# Patient Record
Sex: Female | Born: 1990
Health system: Southern US, Community
[De-identification: ages and names within clinical notes are randomized; demographics above are authoritative.]

## PROBLEM LIST (undated history)

## (undated) DIAGNOSIS — Z803 Family history of malignant neoplasm of breast: Secondary | ICD-10-CM

## (undated) DIAGNOSIS — I509 Heart failure, unspecified: Secondary | ICD-10-CM

## (undated) DIAGNOSIS — D649 Anemia, unspecified: Secondary | ICD-10-CM

## (undated) DIAGNOSIS — B009 Herpesviral infection, unspecified: Secondary | ICD-10-CM

## (undated) DIAGNOSIS — Z800 Family history of malignant neoplasm of digestive organs: Secondary | ICD-10-CM

## (undated) DIAGNOSIS — IMO0002 Reserved for concepts with insufficient information to code with codable children: Secondary | ICD-10-CM

## (undated) DIAGNOSIS — C801 Malignant (primary) neoplasm, unspecified: Secondary | ICD-10-CM

## (undated) HISTORY — PX: NO PAST SURGERIES: SHX2092

## (undated) HISTORY — DX: Family history of malignant neoplasm of digestive organs: Z80.0

## (undated) HISTORY — DX: Reserved for concepts with insufficient information to code with codable children: IMO0002

## (undated) HISTORY — PX: COLPOSCOPY: SHX161

## (undated) HISTORY — DX: Family history of malignant neoplasm of breast: Z80.3

## (undated) HISTORY — DX: Herpesviral infection, unspecified: B00.9

---

## 2010-06-19 ENCOUNTER — Emergency Department (HOSPITAL_COMMUNITY)
Admission: EM | Admit: 2010-06-19 | Discharge: 2010-06-19 | Disposition: A | Discharge status: Home / Self Care | Payer: 59 | Attending: Emergency Medicine | Admitting: Emergency Medicine

## 2010-06-19 DIAGNOSIS — K2970 Gastritis, unspecified, without bleeding: Secondary | ICD-10-CM | POA: Insufficient documentation

## 2010-06-19 DIAGNOSIS — M79609 Pain in unspecified limb: Secondary | ICD-10-CM | POA: Insufficient documentation

## 2010-06-19 DIAGNOSIS — M722 Plantar fascial fibromatosis: Secondary | ICD-10-CM | POA: Insufficient documentation

## 2010-06-19 DIAGNOSIS — X58XXXA Exposure to other specified factors, initial encounter: Secondary | ICD-10-CM | POA: Insufficient documentation

## 2010-06-19 DIAGNOSIS — M545 Low back pain, unspecified: Secondary | ICD-10-CM | POA: Insufficient documentation

## 2010-06-19 DIAGNOSIS — R1013 Epigastric pain: Secondary | ICD-10-CM | POA: Insufficient documentation

## 2010-06-19 DIAGNOSIS — S335XXA Sprain of ligaments of lumbar spine, initial encounter: Secondary | ICD-10-CM | POA: Insufficient documentation

## 2010-06-19 DIAGNOSIS — M546 Pain in thoracic spine: Secondary | ICD-10-CM | POA: Insufficient documentation

## 2010-06-19 LAB — URINALYSIS, ROUTINE W REFLEX MICROSCOPIC
Glucose, UA: NEGATIVE mg/dL
Nitrite: NEGATIVE
pH: 5.5 (ref 5.0–8.0)

## 2010-06-19 LAB — POCT PREGNANCY, URINE: Preg Test, Ur: NEGATIVE

## 2010-06-19 LAB — URINE MICROSCOPIC-ADD ON

## 2010-12-04 ENCOUNTER — Ambulatory Visit (INDEPENDENT_AMBULATORY_CARE_PROVIDER_SITE_OTHER): Payer: 59 | Admitting: Family Medicine

## 2010-12-04 ENCOUNTER — Encounter: Payer: Self-pay | Admitting: *Deleted

## 2010-12-04 ENCOUNTER — Other Ambulatory Visit (HOSPITAL_COMMUNITY)
Admission: RE | Admit: 2010-12-04 | Discharge: 2010-12-04 | Disposition: A | Discharge status: Home / Self Care | Payer: 59 | Source: Ambulatory Visit | Attending: Family Medicine | Admitting: Family Medicine

## 2010-12-04 VITALS — BP 122/80 | HR 75 | Temp 98.7°F | Ht 63.0 in | Wt 195.8 lb

## 2010-12-04 DIAGNOSIS — IMO0001 Reserved for inherently not codable concepts without codable children: Secondary | ICD-10-CM

## 2010-12-04 DIAGNOSIS — N72 Inflammatory disease of cervix uteri: Secondary | ICD-10-CM | POA: Insufficient documentation

## 2010-12-04 DIAGNOSIS — N870 Mild cervical dysplasia: Secondary | ICD-10-CM | POA: Insufficient documentation

## 2010-12-04 DIAGNOSIS — R87610 Atypical squamous cells of undetermined significance on cytologic smear of cervix (ASC-US): Secondary | ICD-10-CM

## 2010-12-04 MED ORDER — OXYCODONE-ACETAMINOPHEN 5-325 MG PO TABS: 1.0000 | ORAL_TABLET | ORAL | Status: AC | PRN

## 2010-12-04 MED ORDER — IBUPROFEN 200 MG PO TABS: 800.0000 mg | ORAL_TABLET | Freq: Once | ORAL | Status: DC

## 2010-12-04 MED ORDER — IBUPROFEN 800 MG PO TABS: 800.0000 mg | ORAL_TABLET | Freq: Three times a day (TID) | ORAL | Status: AC

## 2010-12-04 NOTE — Progress Notes (Signed)
Colposcopy Procedure Note  Indications: Pap smear 3 months ago showed: ASCUS with POSITIVE high risk HPV. This was the patient's first pap.  Procedure Details  The risks and benefits of the procedure and verbal and  Written informed consent obtained.  Speculum placed in vagina and excellent visualization of cervix achieved, cervix swabbed x 3 with acetic acid solution, followed by iodine solution x 3.  Findings: Cervix: visible lesion(s) at 7 o'clock, acetowhite lesion(s) noted at 7 o'clock and mosaicism noted at 7 and 3 o'clock; cervix swabbed with Lugol's solution, SCJ visualized 360 degrees without lesions, endocervical curettage performed, cervical biopsies taken at 7 and 3 o'clock, specimens labelled and sent to pathology and hemostasis achieved with Monsel's solution.  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 Patient provided 800mg  PO Motrin in the clinic and scripts for percocet and motrin to fill.  Plan: Specimens labelled and sent to Pathology. Will base further treatment on Pathology findings. Post biopsy instructions given to patient. Return to discuss Pathology results in 2 weeks or next available.

## 2010-12-04 NOTE — Progress Notes (Signed)
Addended by: Delena Bali on: 12/04/2010 03:38 PM   Modules accepted: Orders

## 2011-01-01 ENCOUNTER — Encounter: Payer: Self-pay | Admitting: Family Medicine

## 2011-01-04 ENCOUNTER — Ambulatory Visit: Payer: 59 | Admitting: Family Medicine

## 2011-01-22 ENCOUNTER — Ambulatory Visit: Payer: 59 | Admitting: Family Medicine

## 2011-07-13 DIAGNOSIS — R87619 Unspecified abnormal cytological findings in specimens from cervix uteri: Secondary | ICD-10-CM

## 2011-07-13 DIAGNOSIS — IMO0002 Reserved for concepts with insufficient information to code with codable children: Secondary | ICD-10-CM

## 2011-07-13 HISTORY — DX: Unspecified abnormal cytological findings in specimens from cervix uteri: R87.619

## 2011-07-13 HISTORY — DX: Reserved for concepts with insufficient information to code with codable children: IMO0002

## 2012-07-08 ENCOUNTER — Encounter: Payer: Self-pay | Admitting: Nurse Practitioner

## 2012-07-08 ENCOUNTER — Telehealth: Payer: Self-pay | Admitting: Nurse Practitioner

## 2012-07-08 NOTE — Telephone Encounter (Signed)
Spoke with pt who took Provera about a month ago to try to regulate her cycle. Pt had normal period after completing provera. Pt had been interested in starting Depo, but now she may be interested in getting pregnant and stated she and her boyfriend "have started trying." Pt is concerned she may have trouble conceiving if she is not having regular cycles and possibly not ovulating. Offered OV for pre-conception counseling, but pt declined. Any advice?

## 2012-07-08 NOTE — Telephone Encounter (Signed)
Patient calling to speak with nurse about irregular cycles.

## 2012-07-08 NOTE — Telephone Encounter (Signed)
This 22 yo female has history of oligomenorrhea since menarche.  It has been suspected that she has PCOS.  She had labs done 08/16/2010 that was consistent with PCOS. She was started on OCP which she only took for a short time.  Since then amenorrhea and has had several courses of Provera/ Prometrium to initiate a menses.  She does not ovulate regular and most likely it will be harder to get pregnant.  She may need to take med's to induce ovulation and be followed closely.  She will need appointment with MD to discuss and may be a candidate for Clomid. There has been lab appointments canceled secondary to not having co-payment.  If she makes appointment with MD she must be informed that she must keep appointment.

## 2012-07-08 NOTE — Telephone Encounter (Signed)
Spoke with pt about PG advice. Pt ready to schedule appt to discuss Clomid or other routes for conception. Sched OV with TL tomorrow at 1030.

## 2012-07-09 ENCOUNTER — Ambulatory Visit (INDEPENDENT_AMBULATORY_CARE_PROVIDER_SITE_OTHER): Payer: 59 | Admitting: Gynecology

## 2012-07-09 VITALS — BP 110/72

## 2012-07-09 DIAGNOSIS — N970 Female infertility associated with anovulation: Secondary | ICD-10-CM | POA: Insufficient documentation

## 2012-07-09 DIAGNOSIS — N926 Irregular menstruation, unspecified: Secondary | ICD-10-CM

## 2012-07-09 MED ORDER — NORETHIN ACE-ETH ESTRAD-FE 1-20 MG-MCG(24) PO TABS: 1.0000 | ORAL_TABLET | Freq: Every day | ORAL | Status: DC

## 2012-07-09 MED ORDER — MEDROXYPROGESTERONE ACETATE 10 MG PO TABS: 10.0000 mg | ORAL_TABLET | Freq: Every day | ORAL | Status: DC

## 2012-07-09 NOTE — Patient Instructions (Signed)
Polycystic Ovarian Syndrome Polycystic ovarian syndrome is a condition with a number of problems. One problem is with the ovaries. The ovaries are organs located in the female pelvis, on each side of the uterus. Usually, during the menstrual cycle, an egg is released from 1 ovary every month. This is called ovulation. When the egg is fertilized, it goes into the womb (uterus), which allows for the growth of a baby. The egg travels from the ovary through the fallopian tube to the uterus. The ovaries also make the hormones estrogen and progesterone. These hormones help the development of a woman's breasts, body shape, and body hair. They also regulate the menstrual cycle and pregnancy. Sometimes, cysts form in the ovaries. A cyst is a fluid-filled sac. On the ovary, different types of cysts can form. The most common type of ovarian cyst is called a functional or ovulation cyst. It is normal, and often forms during the normal menstrual cycle. Each month, a woman's ovaries grow tiny cysts that hold the eggs. When an egg is fully grown, the sac breaks open. This releases the egg. Then, the sac which released the egg from the ovary dissolves. In one type of functional cyst, called a follicle cyst, the sac does not break open to release the egg. It may actually continue to grow. This type of cyst usually disappears within 1 to 3 months.  One type of cyst problem with the ovaries is called Polycystic Ovarian Syndrome (PCOS). In this condition, many follicle cysts form, but do not rupture and produce an egg. This health problem can affect the following:  Menstrual cycle.  Heart.  Obesity.  Cancer of the uterus.  Fertility.  Blood vessels.  Hair growth (face and body) or baldness.  Hormones.  Appearance.  High blood pressure.  Stroke.  Insulin production.  Inflammation of the liver.  Elevated blood cholesterol and triglycerides. CAUSES   No one knows the exact cause of PCOS.  Women with  PCOS often have a mother or sister with PCOS. There is not yet enough proof to say this is inherited.  Many women with PCOS have a weight problem.  Researchers are looking at the relationship between PCOS and the body's ability to make insulin. Insulin is a hormone that regulates the change of sugar, starches, and other food into energy for the body's use, or for storage. Some women with PCOS make too much insulin. It is possible that the ovaries react by making too many female hormones, called androgens. This can lead to acne, excessive hair growth, weight gain, and ovulation problems.  Too much production of luteinizing hormone (LH) from the pituitary gland in the brain stimulates the ovary to produce too much female hormone (androgen). SYMPTOMS   Infrequent or no menstrual periods, and/or irregular bleeding.  Inability to get pregnant (infertility), because of not ovulating.  Increased growth of hair on the face, chest, stomach, back, thumbs, thighs, or toes.  Acne, oily skin, or dandruff.  Pelvic pain.  Weight gain or obesity, usually carrying extra weight around the waist.  Type 2 diabetes (this is the diabetes that usually does not need insulin).  High cholesterol.  High blood pressure.  Female-pattern baldness or thinning hair.  Patches of thickened and dark brown or black skin on the neck, arms, breasts, or thighs.  Skin tags, or tiny excess flaps of skin, in the armpits or neck area.  Sleep apnea (excessive snoring and breathing stops at times while asleep).  Deepening of the voice.    Gestational diabetes when pregnant.  Increased risk of miscarriage with pregnancy. DIAGNOSIS  There is no single test to diagnose PCOS.   Your caregiver will:  Take a medical history.  Perform a pelvic exam.  Perform an ultrasound.  Check your female and female hormone levels.  Measure glucose or sugar levels in the blood.  Do other blood tests.  If you are producing too many  female hormones, your caregiver will make sure it is from PCOS. At the physical exam, your caregiver will want to evaluate the areas of increased hair growth. Try to allow natural hair growth for a few days before the visit.  During a pelvic exam, the ovaries may be enlarged or swollen by the increased number of small cysts. This can be seen more easily by vaginal ultrasound or screening, to examine the ovaries and lining of the uterus (endometrium) for cysts. The uterine lining may become thicker, if there has not been a regular period. TREATMENT  Because there is no cure for PCOS, it needs to be managed to prevent problems. Treatments are based on your symptoms. Treatment is also based on whether you want to have a baby or whether you need contraception.  Treatment may include:  Progesterone hormone, to start a menstrual period.  Birth control pills, to make you have regular menstrual periods.  Medicines to make you ovulate, if you want to get pregnant.  Medicines to control your insulin.  Medicine to control your blood pressure.  Medicine and diet, to control your high cholesterol and triglycerides in your blood.  Surgery, making small holes in the ovary, to decrease the amount of female hormone production. This is done through a long, lighted tube (laparoscope), placed into the pelvis through a tiny incision in the lower abdomen. Your caregiver will go over some of the choices with you. WOMEN WITH PCOS HAVE THESE CHARACTERISTICS:  High levels of female hormones called androgens.  An irregular or no menstrual cycle.  May have many small cysts in their ovaries. PCOS is the most common hormonal reproductive problem in women of childbearing age. WHY DO WOMEN WITH PCOS HAVE TROUBLE WITH THEIR MENSTRUAL CYCLE? Each month, about 20 eggs start to mature in the ovaries. As one egg grows and matures, the follicle breaks open to release the egg, so it can travel through the fallopian tube for  fertilization. When the single egg leaves the follicle, ovulation takes place. In women with PCOS, the ovary does not make all of the hormones it needs for any of the eggs to fully mature. They may start to grow and accumulate fluid, but no one egg becomes large enough. Instead, some may remain as cysts. Since no egg matures or is released, ovulation does not occur and the hormone progesterone is not made. Without progesterone, a woman's menstrual cycle is irregular or absent. Also, the cysts produce female hormones, which continue to prevent ovulation.  Document Released: 06/22/2004 Document Revised: 05/21/2011 Document Reviewed: 01/14/2009 Exit[employer] Patient Information 2013 [employer].  

## 2012-07-09 NOTE — Progress Notes (Signed)
Pt presents c/o lifelong history of oligomenorrhea.  Pt states menarche was 12, initially monthly but after a few years q56m, now no cycle without ocp.  Pt reports withdraw bleed with provera every time, given only 2x.  Last 2/14, flow 7d,  + clots-raisin size.  Mild cramping.  Pt has never had post-coital bleeding.  Pt currently using condoms.  Pt never been pregnant.  Pt has never been formally evaluated, she is not pressed to conceive today but was concerned because of family history in her mother and meternal aunts.  Pt does report obesity as well in those relations.  Had a long discussion regarding anovulation and its causes.  Discussed affects of obesity and extra estrogen, risks of uterine cancer with chronic exposure to unopposed estrogen. Drew pictures of polycystic ovaries and risks of elevated LH, and metabolic syndrome. Ovulation induction methods also briefly discussed, but pt not interested in treatment at this time.  Benefits of ocp reviewed.  Pt will be evaluated for PCOS/metabolic syndrome We will draw a qual HCG and bring on her menses if negative with provera, she is instructed to start OC with inset of flow. She will rto fasting  LMP approx 3/7 brought on with provera  Length of visit 40m, >50% face to face discussing anovulation

## 2012-07-10 ENCOUNTER — Other Ambulatory Visit (INDEPENDENT_AMBULATORY_CARE_PROVIDER_SITE_OTHER): Payer: 59

## 2012-07-10 DIAGNOSIS — N926 Irregular menstruation, unspecified: Secondary | ICD-10-CM

## 2012-07-11 ENCOUNTER — Ambulatory Visit: Payer: Self-pay | Admitting: Gynecology

## 2012-07-11 ENCOUNTER — Institutional Professional Consult (permissible substitution): Payer: Self-pay | Admitting: Gynecology

## 2012-07-11 LAB — HCG, SERUM, QUALITATIVE: Preg, Serum: NEGATIVE

## 2012-07-11 LAB — TSH: TSH: 2.032 u[IU]/mL (ref 0.350–4.500)

## 2012-07-11 LAB — GLUCOSE, FASTING: Glucose, Fasting: 82 mg/dL (ref 70–99)

## 2012-07-11 LAB — INSULIN, FASTING: Insulin fasting, serum: 10 u[IU]/mL (ref 3–28)

## 2012-07-14 ENCOUNTER — Telehealth: Payer: Self-pay | Admitting: Gynecology

## 2012-07-14 MED ORDER — MEDROXYPROGESTERONE ACETATE 10 MG PO TABS: 10.0000 mg | ORAL_TABLET | Freq: Every day | ORAL | Status: DC

## 2012-07-14 MED ORDER — NORETHIN ACE-ETH ESTRAD-FE 1-20 MG-MCG(24) PO TABS: 1.0000 | ORAL_TABLET | Freq: Every day | ORAL | Status: DC

## 2012-07-14 NOTE — Telephone Encounter (Signed)
Please Advise -Routed to Dr. Lathrop 

## 2012-07-14 NOTE — Telephone Encounter (Signed)
Pt notified of labs

## 2012-07-14 NOTE — Addendum Note (Signed)
Addended by: Lorraine Lax on: 07/14/2012 01:44 PM   Modules accepted: Orders

## 2012-07-14 NOTE — Telephone Encounter (Signed)
Pharmacy is calling because insurance company will not pay for the minastrin fe 24.

## 2012-07-15 NOTE — Telephone Encounter (Signed)
Switch to lomedia

## 2012-07-15 NOTE — Telephone Encounter (Signed)
Per Dr. Farrel Gobble Pt is instructed to start her provera to bring on her menses, then the first day of flow she is to take the lomedia; lomedia 24 fe sent through to [facility] on [address] by Dr. Farrel Gobble. Pt notified and aware

## 2012-07-17 ENCOUNTER — Telehealth: Payer: Self-pay | Admitting: Nurse Practitioner

## 2012-07-17 NOTE — Telephone Encounter (Signed)
[facility] [address] [phone]-patient needs another birth control option due to current rx costing $100.00.

## 2012-07-17 NOTE — Telephone Encounter (Signed)
Routed to Dr. Farrel Gobble please advise.

## 2012-07-18 ENCOUNTER — Other Ambulatory Visit: Payer: Self-pay | Admitting: Gynecology

## 2012-07-18 MED ORDER — DESOGESTREL-ETHINYL ESTRADIOL 0.15-0.02/0.01 MG (21/5) PO TABS: 1.0000 | ORAL_TABLET | Freq: Every day | ORAL | Status: DC

## 2012-07-18 NOTE — Telephone Encounter (Signed)
Called in [employer], hope it's covered

## 2012-07-18 NOTE — Telephone Encounter (Signed)
Pt notified of Dr. Farrel Gobble calling in her [facility] mircette; pt says it sounds familiar.

## 2012-08-11 ENCOUNTER — Telehealth: Payer: Self-pay | Admitting: Nurse Practitioner

## 2012-08-11 NOTE — Telephone Encounter (Signed)
Spoke with pt who was dx with PCOS and was started on OCP for treatment. Pt has printout discussing treatment of PCOS and it mentions other options for treatment. She is wondering if she should be doing anything else to treat her PCOS. Pt also reports she was dx with herpes on a prior visit and medication was mentioned, but pt did not have health insurance at the time. Pt didn't know if she needed anything for the herpes. Pt denies any other outbreaks since then.  No immediate need for Valtrex? Please advise on anything further for PCOS.

## 2012-08-11 NOTE — Telephone Encounter (Signed)
Patient has questions for nurse about the birth control pills she is taking.

## 2012-08-11 NOTE — Telephone Encounter (Signed)
Dr. Farrel Gobble you saw this patient for AEX - does she need further treatment or evaluation? Sending this to you for advise and then back to nurse.

## 2012-08-12 NOTE — Telephone Encounter (Signed)
Routed to Amy

## 2012-08-12 NOTE — Telephone Encounter (Signed)
Spoke with pt about sched OV to discuss PCOS tx options. Pt wants to wait until next payday 08-22-12. Sched OV 08-22-12 with TL.

## 2012-08-12 NOTE — Telephone Encounter (Signed)
Ov to discuss options would be best

## 2012-08-22 ENCOUNTER — Ambulatory Visit (INDEPENDENT_AMBULATORY_CARE_PROVIDER_SITE_OTHER): Payer: 59 | Admitting: Gynecology

## 2012-08-22 VITALS — BP 104/66 | HR 78 | Resp 18 | Ht 63.0 in | Wt 143.0 lb

## 2012-08-22 DIAGNOSIS — E282 Polycystic ovarian syndrome: Secondary | ICD-10-CM | POA: Insufficient documentation

## 2012-08-22 DIAGNOSIS — N915 Oligomenorrhea, unspecified: Secondary | ICD-10-CM

## 2012-08-22 MED ORDER — DESOGESTREL-ETHINYL ESTRADIOL 0.15-0.02/0.01 MG (21/5) PO TABS: 1.0000 | ORAL_TABLET | Freq: Every day | ORAL | Status: DC

## 2012-08-22 NOTE — Patient Instructions (Signed)
Polycystic Ovarian Syndrome  Polycystic ovarian syndrome is a condition with a number of problems. One problem is with the ovaries. The ovaries are organs located in the female pelvis, on each side of the uterus. Usually, during the menstrual cycle, an egg is released from 1 ovary every month. This is called ovulation. When the egg is fertilized, it goes into the womb (uterus), which allows for the growth of a baby. The egg travels from the ovary through the fallopian tube to the uterus. The ovaries also make the hormones estrogen and progesterone. These hormones help the development of a woman's breasts, body shape, and body hair. They also regulate the menstrual cycle and pregnancy.  Sometimes, cysts form in the ovaries. A cyst is a fluid-filled sac. On the ovary, different types of cysts can form. The most common type of ovarian cyst is called a functional or ovulation cyst. It is normal, and often forms during the normal menstrual cycle. Each month, a woman's ovaries grow tiny cysts that hold the eggs. When an egg is fully grown, the sac breaks open. This releases the egg. Then, the sac which released the egg from the ovary dissolves. In one type of functional cyst, called a follicle cyst, the sac does not break open to release the egg. It may actually continue to grow. This type of cyst usually disappears within 1 to 3 months.   One type of cyst problem with the ovaries is called Polycystic Ovarian Syndrome (PCOS). In this condition, many follicle cysts form, but do not rupture and produce an egg. This health problem can affect the following:  · Menstrual cycle.  · Heart.  · Obesity.  · Cancer of the uterus.  · Fertility.  · Blood vessels.  · Hair growth (face and body) or baldness.  · Hormones.  · Appearance.  · High blood pressure.  · Stroke.  · Insulin production.  · Inflammation of the liver.  · Elevated blood cholesterol and triglycerides.  CAUSES   · No one knows the exact cause of PCOS.  · Women with  PCOS often have a mother or sister with PCOS. There is not yet enough proof to say this is inherited.  · Many women with PCOS have a weight problem.  · Researchers are looking at the relationship between PCOS and the body's ability to make insulin. Insulin is a hormone that regulates the change of sugar, starches, and other food into energy for the body's use, or for storage. Some women with PCOS make too much insulin. It is possible that the ovaries react by making too many female hormones, called androgens. This can lead to acne, excessive hair growth, weight gain, and ovulation problems.  · Too much production of luteinizing hormone (LH) from the pituitary gland in the brain stimulates the ovary to produce too much female hormone (androgen).  SYMPTOMS   · Infrequent or no menstrual periods, and/or irregular bleeding.  · Inability to get pregnant (infertility), because of not ovulating.  · Increased growth of hair on the face, chest, stomach, back, thumbs, thighs, or toes.  · Acne, oily skin, or dandruff.  · Pelvic pain.  · Weight gain or obesity, usually carrying extra weight around the waist.  · Type 2 diabetes (this is the diabetes that usually does not need insulin).  · High cholesterol.  · High blood pressure.  · Female-pattern baldness or thinning hair.  · Patches of thickened and dark brown or black skin on the neck, arms, breasts,   or thighs.  · Skin tags, or tiny excess flaps of skin, in the armpits or neck area.  · Sleep apnea (excessive snoring and breathing stops at times while asleep).  · Deepening of the voice.  · Gestational diabetes when pregnant.  · Increased risk of miscarriage with pregnancy.  DIAGNOSIS   There is no single test to diagnose PCOS.   · Your caregiver will:  · Take a medical history.  · Perform a pelvic exam.  · Perform an ultrasound.  · Check your female and female hormone levels.  · Measure glucose or sugar levels in the blood.  · Do other blood tests.  · If you are producing too many  female hormones, your caregiver will make sure it is from PCOS. At the physical exam, your caregiver will want to evaluate the areas of increased hair growth. Try to allow natural hair growth for a few days before the visit.  · During a pelvic exam, the ovaries may be enlarged or swollen by the increased number of small cysts. This can be seen more easily by vaginal ultrasound or screening, to examine the ovaries and lining of the uterus (endometrium) for cysts. The uterine lining may become thicker, if there has not been a regular period.  TREATMENT   Because there is no cure for PCOS, it needs to be managed to prevent problems. Treatments are based on your symptoms. Treatment is also based on whether you want to have a baby or whether you need contraception.   Treatment may include:  · Progesterone hormone, to start a menstrual period.  · Birth control pills, to make you have regular menstrual periods.  · Medicines to make you ovulate, if you want to get pregnant.  · Medicines to control your insulin.  · Medicine to control your blood pressure.  · Medicine and diet, to control your high cholesterol and triglycerides in your blood.  · Surgery, making small holes in the ovary, to decrease the amount of female hormone production. This is done through a long, lighted tube (laparoscope), placed into the pelvis through a tiny incision in the lower abdomen.  Your caregiver will go over some of the choices with you.  WOMEN WITH PCOS HAVE THESE CHARACTERISTICS:  · High levels of female hormones called androgens.  · An irregular or no menstrual cycle.  · May have many small cysts in their ovaries.  PCOS is the most common hormonal reproductive problem in women of childbearing age.  WHY DO WOMEN WITH PCOS HAVE TROUBLE WITH THEIR MENSTRUAL CYCLE?  Each month, about 20 eggs start to mature in the ovaries. As one egg grows and matures, the follicle breaks open to release the egg, so it can travel through the fallopian tube for  fertilization. When the single egg leaves the follicle, ovulation takes place. In women with PCOS, the ovary does not make all of the hormones it needs for any of the eggs to fully mature. They may start to grow and accumulate fluid, but no one egg becomes large enough. Instead, some may remain as cysts. Since no egg matures or is released, ovulation does not occur and the hormone progesterone is not made. Without progesterone, a woman's menstrual cycle is irregular or absent. Also, the cysts produce female hormones, which continue to prevent ovulation.   Document Released: 06/22/2004 Document Revised: 05/21/2011 Document Reviewed: 01/14/2009  ExitCare® Patient Information ©2014 [employer].

## 2012-08-25 NOTE — Progress Notes (Signed)
Pt here to discuss her PCOS and questions ability to get pregnant.  She is currently not interested in being pregnant but just want to know if it is possible.  We reviewed her labs in her paper chart and discussed PCOS and it's affects on ovulation in addition we discussed metabolic syndrome and the affects on it on general health.  Pt did have normal fasting insulin, no fasting glucose had been done, an FSH/LH ratio was also not done but as pt is on ocp, canot.  We discussed the benefits of ocp as she does not get a regular menses and risk of uterine cancer.  I assured her that there are medications currently available to help her concieve but we should use them when she is actively trying to do so. At present, she should stay on ocp. Refill given Agreeable. Questions addressed

## 2012-09-18 ENCOUNTER — Telehealth: Payer: Self-pay | Admitting: Nurse Practitioner

## 2012-09-18 NOTE — Telephone Encounter (Signed)
LMTCB  aa 

## 2012-09-18 NOTE — Telephone Encounter (Signed)
Patient has some questions for nurse about lab work she may want to have.

## 2012-09-19 NOTE — Telephone Encounter (Signed)
Last office visit 08/22/2012 with Dr. Farrel Gobble PCOS Patient calling today to schedule appointment with Dr. Farrel Gobble concerning PCOS . Did not fully understand at visit that would need lab work to further diagnose PCOS and if she wanted to conceive should let Dr. Farrel Gobble know so Lab work and medications could be done for this.,  Appointment given for July 21st @ 5/30pm with Dr. Farrel Gobble.

## 2012-09-29 ENCOUNTER — Telehealth: Payer: Self-pay | Admitting: Gynecology

## 2012-09-29 ENCOUNTER — Ambulatory Visit: Payer: 59 | Admitting: Gynecology

## 2012-09-29 NOTE — Telephone Encounter (Signed)
Per our last note, I thought she was just supposed to get some fasting labs if she wanted but I did not see a recall ov requested. Ok to cancel, is she taking the ocp?

## 2012-09-29 NOTE — Telephone Encounter (Signed)
Patient canceled her appointment today for consult and lab work. Patient says she doesn't want to pay the $50.00 copay.

## 2012-09-30 NOTE — Telephone Encounter (Signed)
Left Message To Call Back  

## 2012-09-30 NOTE — Telephone Encounter (Signed)
Per Dr. Farrel Gobble okay for patient to have bloodwork, patient not sure what she needs to have done looked in patient's chart she has had FSH/LH, TSH, Prolactin, Fasting Insulin, Fasting Glucose all were normal. Told pt she wouldn't need to have anything done since Dr. Farrel Gobble check her bloodwork and everything was consistent with PCOS. Pt agreeable.  Patient asked about getting medication for a HSV outbreak told patient if we have checked her HSV in the past, she said yes. She said Valtrex was prescribed to her in the past  but she couldn't afford it due to her not having insurance. I asked her if she was having a outbreak now, pt said she was not. Just wanted to know what she needed to do in the future if she does.  Told her all she would need to do was just give [place] a call. Pt is agreeable.

## 2012-09-30 NOTE — Telephone Encounter (Signed)
Patient says she was told that she needed another consult and bloodwork. Patient was confused and is not sure what she should have done. Her co pay is $50.00  says she was willing to get bloodwork done. I told her that I would check with Dr. Farrel Gobble since there was some confusion. Please advise.

## 2012-09-30 NOTE — Telephone Encounter (Signed)
Patient was concern with question you had ask, need more information.

## 2012-09-30 NOTE — Telephone Encounter (Signed)
Patient called again wanting to make sure she didn't need anything for her HSV, asked patient again if she was having a outbreak patient said she is not, just wanted to make sure all her questions were asked. Informed patient that she doesn't need any medication unless she is having a outbreak and if she does then she can give [place] a call and we can prescribe something for her. Pt is aware and agreeable.

## 2012-11-27 ENCOUNTER — Telehealth: Payer: Self-pay | Admitting: Nurse Practitioner

## 2012-11-27 NOTE — Telephone Encounter (Signed)
Spoke with patient. She wanted clarification that HIV testing wasn't on MYchart. I advised previous tests would not be on Mychart. She has NR testing done on 12/13 and 6/12, patient given results. No further questions.

## 2012-11-27 NOTE — Telephone Encounter (Signed)
Patient wants to know if she has ever been tested for any STDs.

## 2013-02-13 ENCOUNTER — Ambulatory Visit: Payer: Self-pay | Admitting: Nurse Practitioner

## 2013-02-18 ENCOUNTER — Encounter: Payer: Self-pay | Admitting: Nurse Practitioner

## 2013-02-18 ENCOUNTER — Ambulatory Visit: Payer: 59 | Admitting: Nurse Practitioner

## 2017-09-05 ENCOUNTER — Encounter: Payer: Self-pay | Admitting: Certified Nurse Midwife

## 2019-02-19 ENCOUNTER — Other Ambulatory Visit: Payer: Self-pay | Admitting: Obstetrics and Gynecology

## 2019-02-19 DIAGNOSIS — R590 Localized enlarged lymph nodes: Secondary | ICD-10-CM

## 2019-02-19 DIAGNOSIS — N63 Unspecified lump in unspecified breast: Secondary | ICD-10-CM

## 2019-02-26 ENCOUNTER — Other Ambulatory Visit: Payer: Self-pay | Admitting: Obstetrics and Gynecology

## 2019-02-26 ENCOUNTER — Other Ambulatory Visit: Payer: Self-pay

## 2019-02-26 ENCOUNTER — Ambulatory Visit
Admission: RE | Admit: 2019-02-26 | Discharge: 2019-02-26 | Disposition: A | Discharge status: Home / Self Care | Payer: Medicaid Other | Source: Ambulatory Visit | Attending: Obstetrics and Gynecology | Admitting: Obstetrics and Gynecology

## 2019-02-26 DIAGNOSIS — R590 Localized enlarged lymph nodes: Secondary | ICD-10-CM

## 2019-02-26 DIAGNOSIS — N63 Unspecified lump in unspecified breast: Secondary | ICD-10-CM

## 2019-02-26 IMAGING — US US AXILLARY NODE CORE BIOPSY LEFT
1 series · 10 of 10 positions shown · non-contrast
Comparison: Previous exam(s).
COMPARISON: Previous exam(s).

Addendum:
CLINICAL DATA: Patient presents for biopsies of the LEFT axilla and
LEFT breast. Patient is pregnant with gestational age of 20 weeks 6
days by EDC of [DATE].

EXAM:
US AXILLARY NODE CORE BIOPSY LEFT

[Series 1: us axillary node core biopsy left · 0.08mm/px · 10 of 10 slices shown]
[im 1/10]
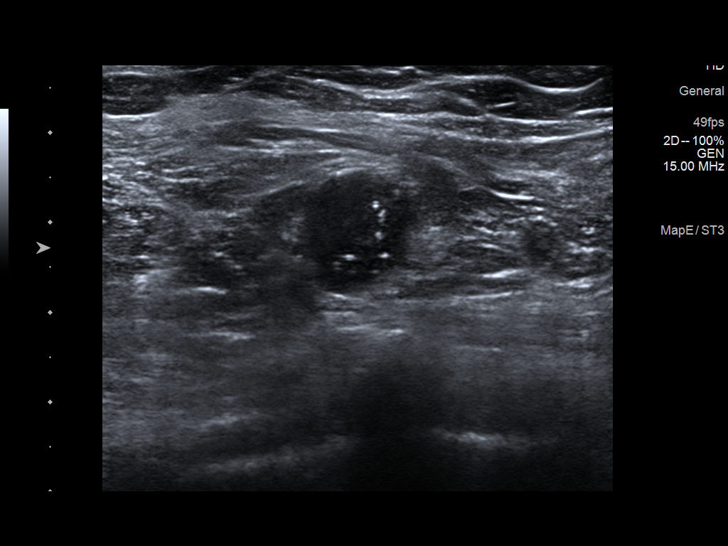
[im 2/10]
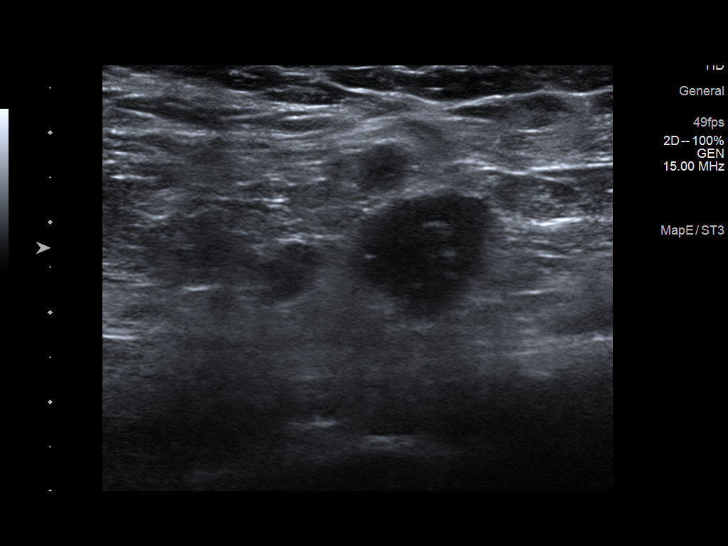
[im 3/10]
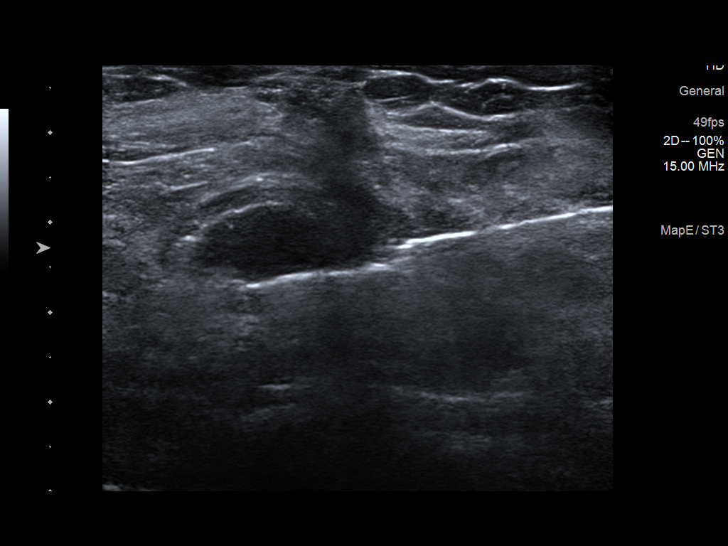
[im 4/10]
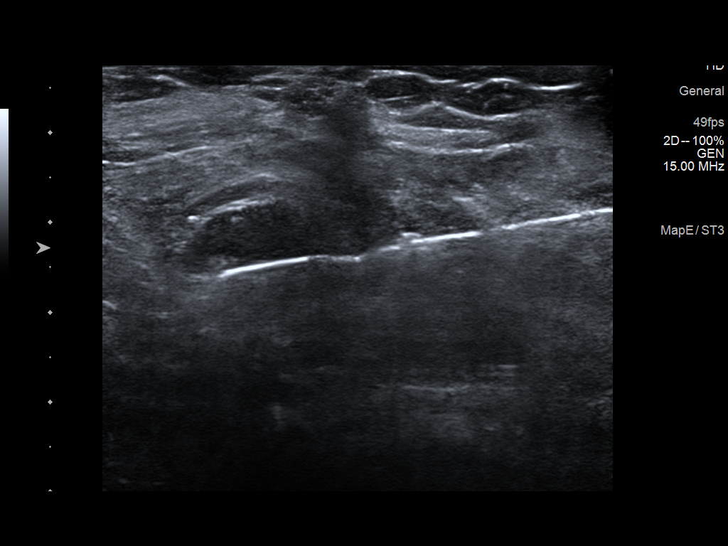
[im 5/10]
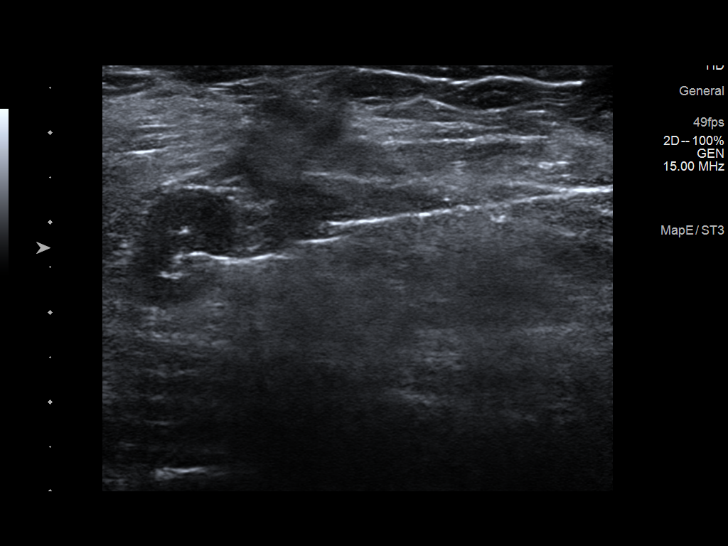
[im 6/10]
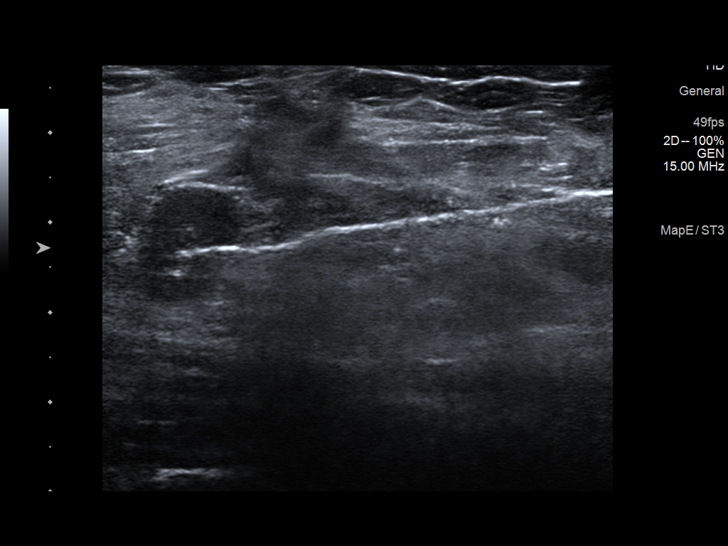
[im 7/10]
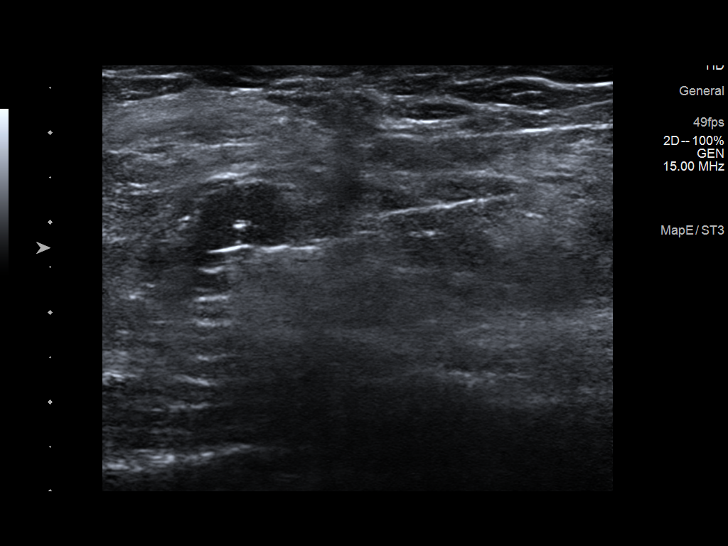
[im 8/10]
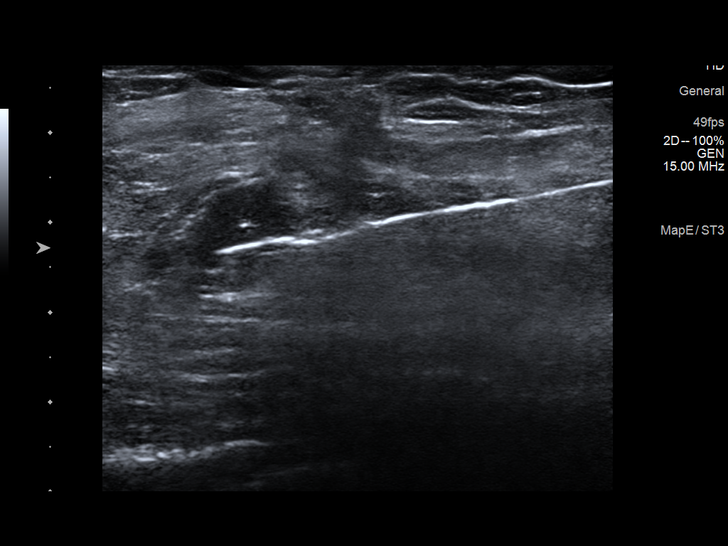
[im 9/10]
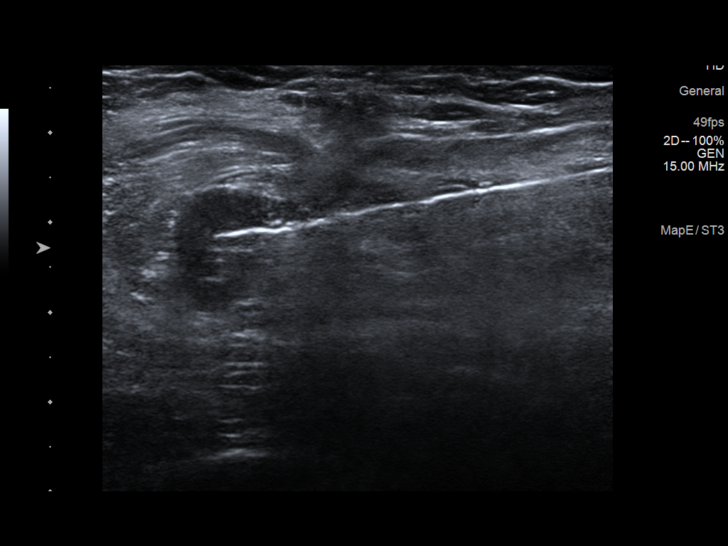
[im 10/10]
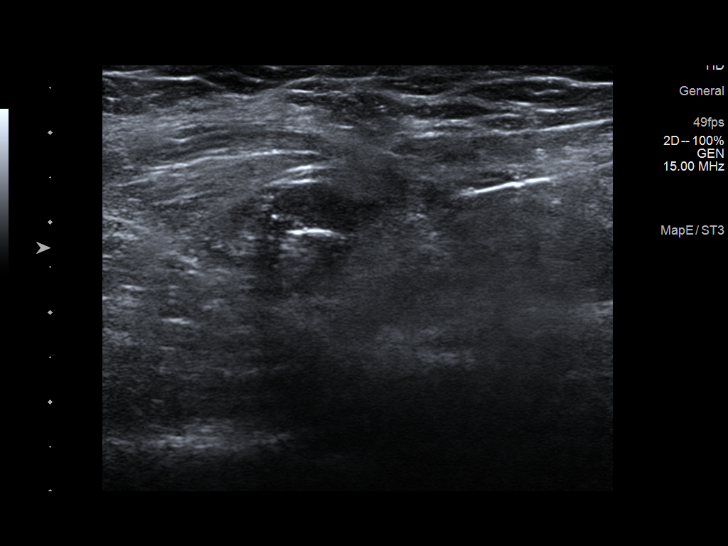

[10 of 10 positions shown; findings below may reference images not displayed]



Using sterile technique and 1% Lidocaine as local anesthetic, under
direct ultrasound visualization, a 14 gauge LABELLE SALHA device was
used to perform biopsy of enlarged LOWER LEFT axillary lymph node
containing calcifications using a LATERAL to MEDIAL approach. At the
conclusion of the procedure spiral HydroMARK tissue marker clip was
deployed into the biopsy cavity. Follow up 2 view mammogram was
performed and dictated separately.
IMPRESSION: Ultrasound guided biopsy of LEFT axillary lymph node. No apparent
complications.

ADDENDUM:
PATHOLOGY ADDENDUM:

Pathology:

1. Lymph node, needle/core biopsy, left axillary, spiral hydromark
clip

- INVASIVE MAMMARY CARCINOMA

- NO DEFINITIVE NODAL TISSUE IDENTIFIED

2. Breast, left, needle core biopsy, medial anterior calc coil clip

- INVASIVE MAMMARY CARCINOMA

- DUCTAL CARCINOMA IN SITU

- LYMPHOVASCULAR SPACE INVASION PRESENT

3. Breast, left, needle core biopsy, medial posterior X clip

- INVASIVE MAMMARY CARCINOMA

- DUCTAL CARCINOMA IN SITU

- LYMPHOVASCULAR SPACE INVASION PRESENT

Pathology concordance with imaging findings: ALL 3 SITES ARE
CONCORDANT.

Recommendation: Oncology and surgical consultations. At the request
of the patient, appointment is scheduled with Dr. LABELLE SALHA at the [HOSPITAL] [facility]OSPITAL] on [DATE]. Follow-up appointment with Dr.
LABELLE SALHA be scheduled for the patient.

I spoke with her by telephone on [DATE] at [DATE]. She reports
tenderness at the biopsy sites. For pain relief, patient has been
taking 3-4 Tylenol tablets. I advised the patient not to take more
than 2 Tylenol tablets every 4-6 hours. The patient was given her
appointment time with Dr. LABELLE SALHA. The [HOSPITAL] will follow-up
with her.

I am unable to reach Dr. LABELLE SALHA afternoon, as her office is
closed for the day.



Using sterile technique and 1% Lidocaine as local anesthetic, under
direct ultrasound visualization, a 14 gauge LABELLE SALHA device was
used to perform biopsy of enlarged LOWER LEFT axillary lymph node
containing calcifications using a LATERAL to MEDIAL approach. At the
conclusion of the procedure spiral HydroMARK tissue marker clip was
deployed into the biopsy cavity. Follow up 2 view mammogram was
performed and dictated separately.
IMPRESSION: Ultrasound guided biopsy of LEFT axillary lymph node. No apparent
complications.

## 2019-02-26 IMAGING — MG MM BREAST BX W LOC DEV EA AD LESION IMG BX SPEC STEREO GUIDE*L*
5 of 8 series · 5 of 24 positions shown · non-contrast
Comparison: Previous exams.
COMPARISON: Previous exams.

Addendum:
CLINICAL DATA: Patient presents for stereotactic guided core biopsy
of 2 sites in the LEFT breast.

Biopsy of LEFT axillary lymph node is dictated separately.
EXAM:
LEFT BREAST STEREOTACTIC CORE NEEDLE BIOPSY x2

[L CC (1 of 4)]
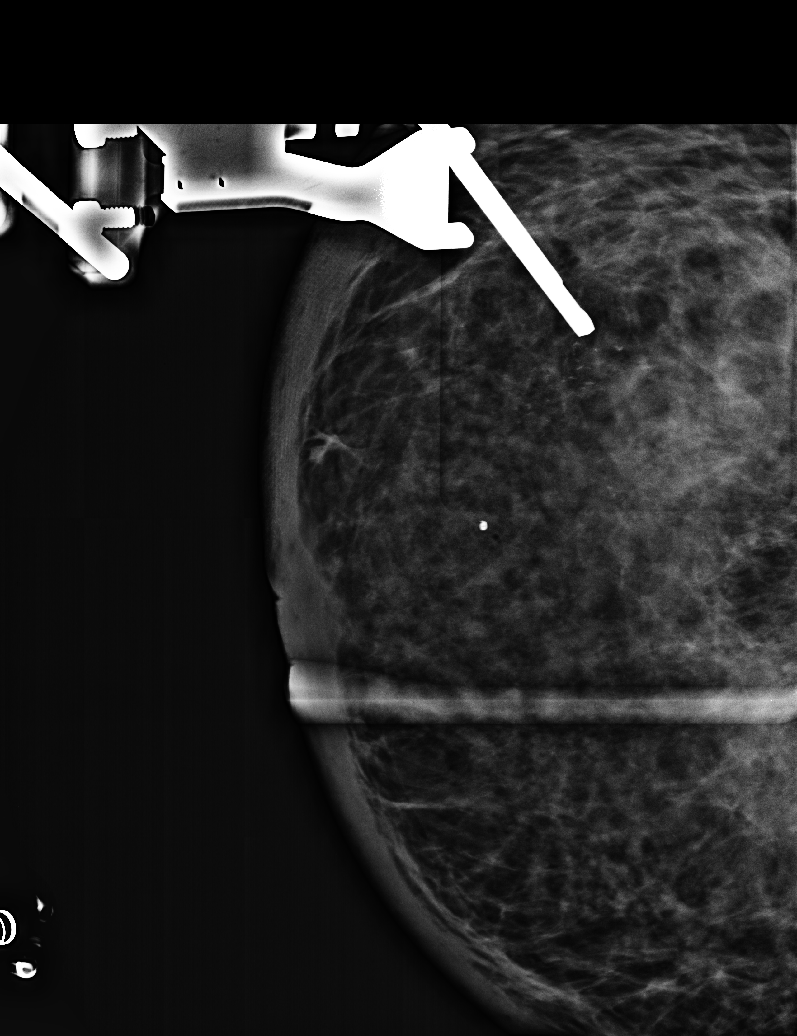

[L CC (2 of 4)]
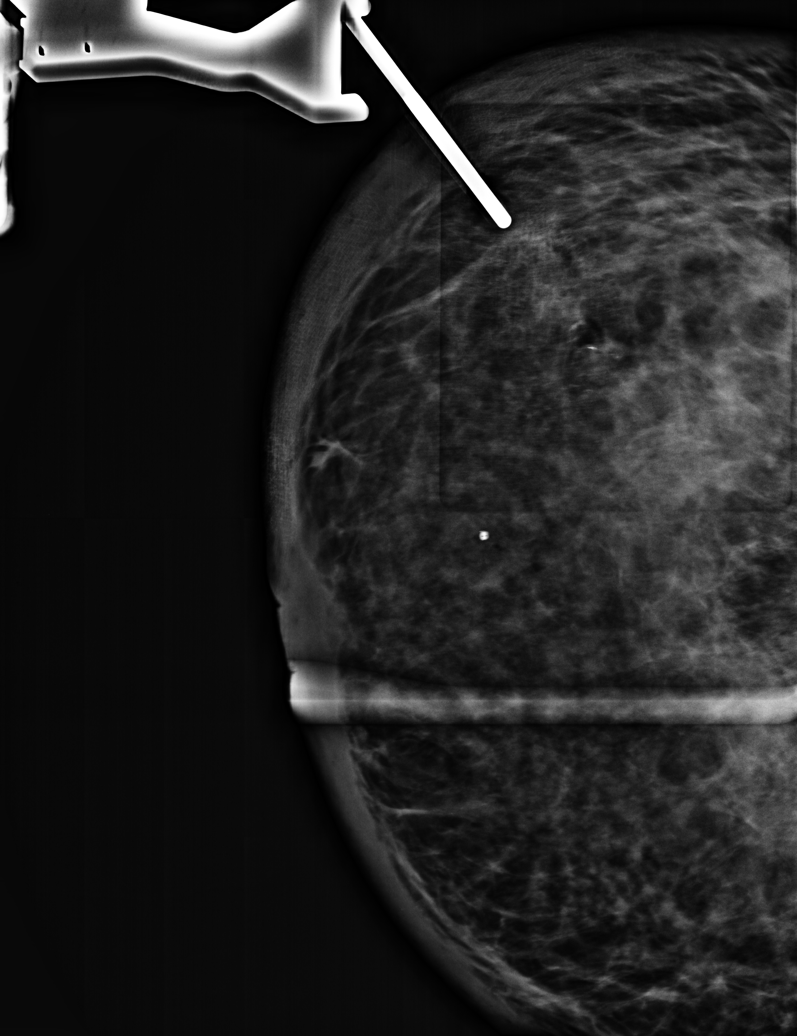

[L CC (3 of 4)]
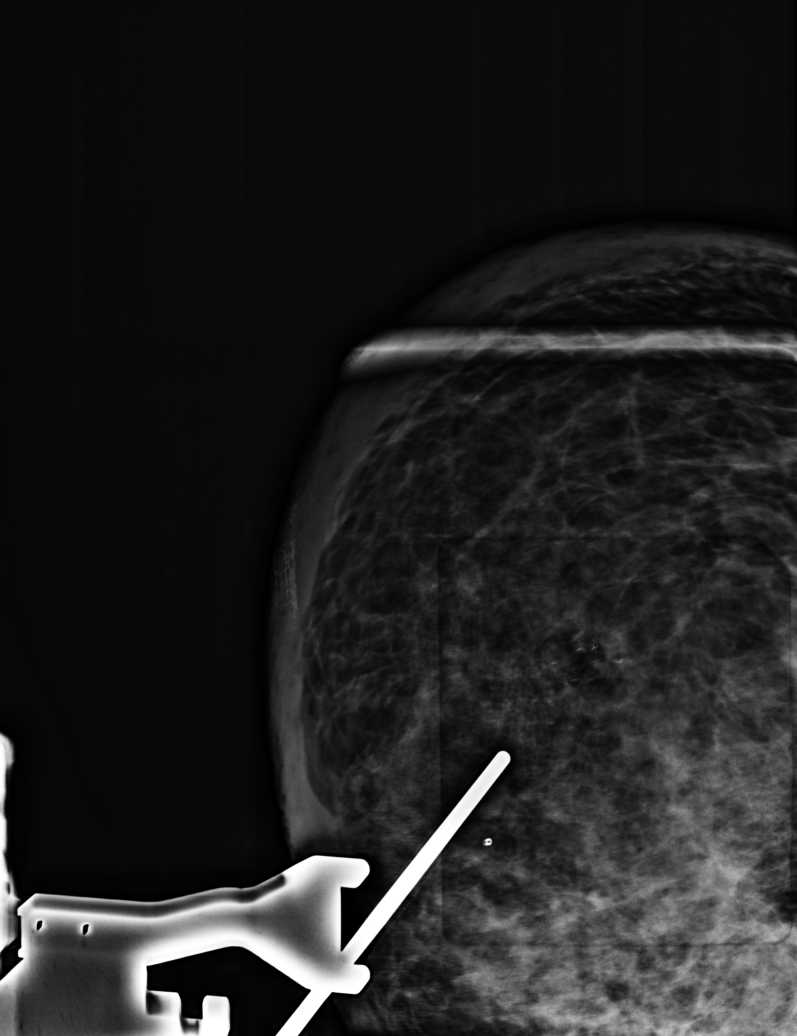

[L CC (4 of 4)]
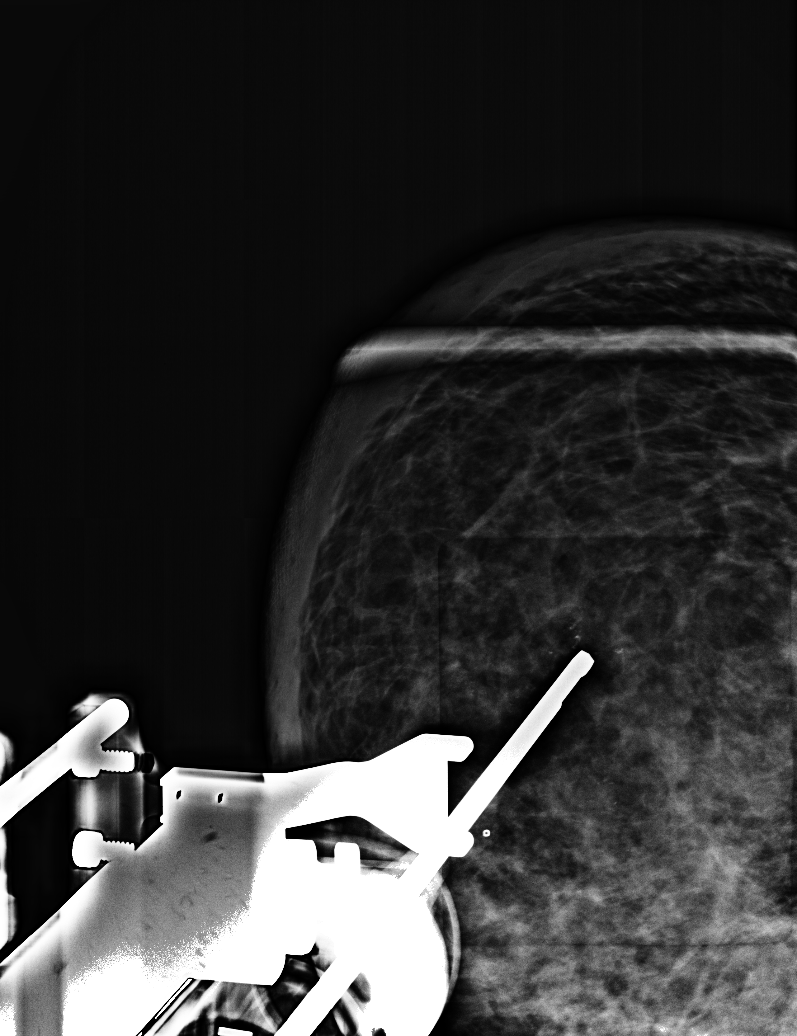

[L CC tomo · tomo slice 63/125.0]
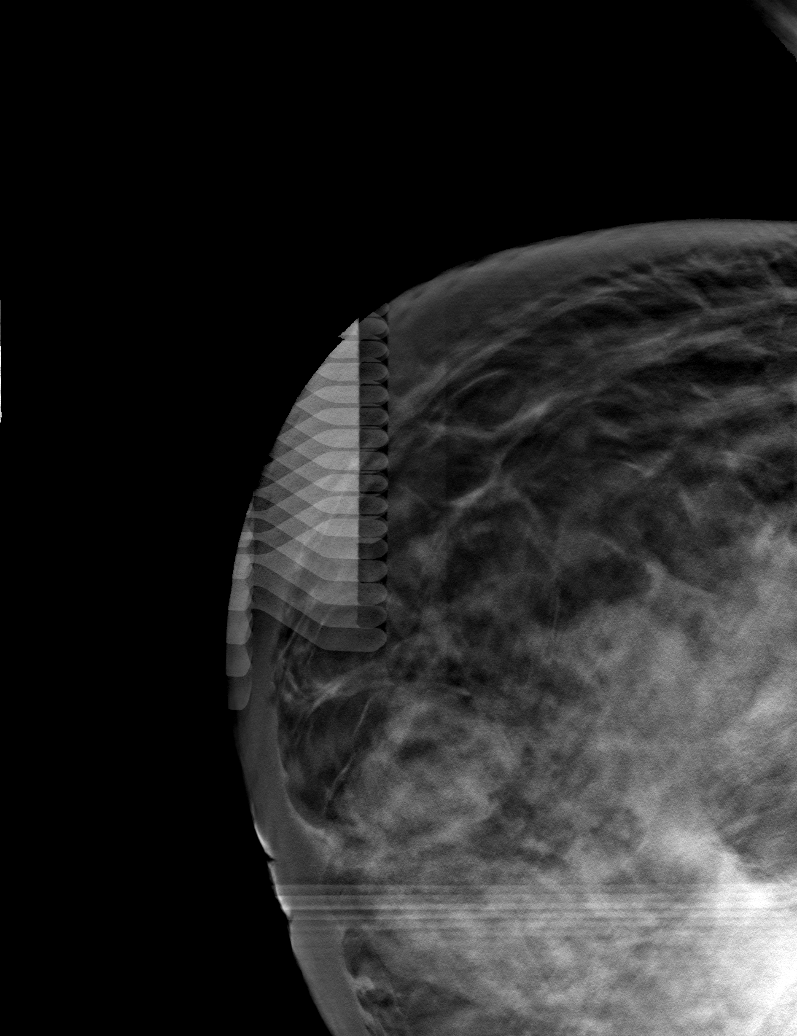

[5 of 24 positions shown; findings below may reference images not displayed]

FINDINGS: The patient and I discussed the procedure of stereotactic-guided
biopsy including benefits and alternatives. We discussed the high
likelihood of a successful procedure. We discussed the safety of the
procedure for her fetus. We discussed the risks of the procedure
including infection, bleeding, tissue injury, clip migration, and
inadequate sampling. Informed written consent was given. The usual
time out protocol was performed immediately prior to the procedure.

Site 1: Coil clip; lesion quadrant: UPPER INNER QUADRANT LEFT
breast- anterior

Using sterile technique, 1% Lidocaine and 1% lidocaine with
epinephrine as local anesthetic, under stereotactic guidance, a 9
gauge vacuum assisted device was used to perform core needle biopsy
of calcifications in the MEDIAL anterior portion of the LEFT breast
using a craniocaudal approach. Specimen radiograph was performed
showing calcifications in numerous samples. Specimens with
calcifications are identified for pathology.

At the conclusion of the procedure, coil shaped tissue marker clip
was deployed into the biopsy cavity. Follow-up 2-view mammogram was
performed and dictated separately.

Site 2: X clip; lesion quadrant: UPPER INNER QUADRANT LEFT breast -
posterior

Using sterile technique and 1% Lidocaine as local anesthetic, under
stereotactic guidance, a 9 gauge vacuum assisted device was used to
perform core needle biopsy of calcifications in the MEDIAL posterior
portion of the LEFT breast using a craniocaudal approach. Specimen
radiograph was performed showing calcifications in numerous samples.
Specimens with calcifications are identified for pathology.

At the conclusion of the procedure, X-shaped tissue marker clip was
deployed into the biopsy cavity. Follow-up 2-view mammogram was
performed and dictated separately.

)
IMPRESSION: Stereotactic-guided biopsy of 2 sites of calcifications in the LEFT
breast. No apparent complications.

See separate dictation for ultrasound-guided core biopsy of enlarged
LEFT axillary lymph node.

ADDENDUM:
PATHOLOGY ADDENDUM:

Pathology:

1. Lymph node, needle/core biopsy, left axillary, spiral hydromark
clip

- INVASIVE MAMMARY CARCINOMA

- NO DEFINITIVE NODAL TISSUE IDENTIFIED

2. Breast, left, needle core biopsy, medial anterior calc coil clip

- INVASIVE MAMMARY CARCINOMA

- DUCTAL CARCINOMA IN SITU

- LYMPHOVASCULAR SPACE INVASION PRESENT

3. Breast, left, needle core biopsy, medial posterior X clip

- INVASIVE MAMMARY CARCINOMA

- DUCTAL CARCINOMA IN SITU

- LYMPHOVASCULAR SPACE INVASION PRESENT

Pathology concordance with imaging findings: ALL 3 SITES ARE
CONCORDANT.

Recommendation: Oncology and surgical consultations. At the request
of the patient, appointment is scheduled with Dr. RUSUL at the [HOSPITAL] [facility]HOSPITAL] on [DATE]. Follow-up appointment with Dr.
RUSUL be scheduled for the patient.

I spoke with her by telephone on [DATE] at [DATE]. She reports
tenderness at the biopsy sites. For pain relief, patient has been
taking 3-4 Tylenol tablets. I advised the patient not to take more
than 2 Tylenol tablets every 4-6 hours. The patient was given her
appointment time with Dr. RUSUL. The [HOSPITAL] will follow-up
with her.

I am unable to reach Dr. RUSUL afternoon, as her office is
closed for the day.

*** End of Addendum ***
FINDINGS: The patient and I discussed the procedure of stereotactic-guided
biopsy including benefits and alternatives. We discussed the high
likelihood of a successful procedure. We discussed the safety of the
procedure for her fetus. We discussed the risks of the procedure
including infection, bleeding, tissue injury, clip migration, and
inadequate sampling. Informed written consent was given. The usual
time out protocol was performed immediately prior to the procedure.

Site 1: Coil clip; lesion quadrant: UPPER INNER QUADRANT LEFT
breast- anterior

Using sterile technique, 1% Lidocaine and 1% lidocaine with
epinephrine as local anesthetic, under stereotactic guidance, a 9
gauge vacuum assisted device was used to perform core needle biopsy
of calcifications in the MEDIAL anterior portion of the LEFT breast
using a craniocaudal approach. Specimen radiograph was performed
showing calcifications in numerous samples. Specimens with
calcifications are identified for pathology.

At the conclusion of the procedure, coil shaped tissue marker clip
was deployed into the biopsy cavity. Follow-up 2-view mammogram was
performed and dictated separately.

Site 2: X clip; lesion quadrant: UPPER INNER QUADRANT LEFT breast -
posterior

Using sterile technique and 1% Lidocaine as local anesthetic, under
stereotactic guidance, a 9 gauge vacuum assisted device was used to
perform core needle biopsy of calcifications in the MEDIAL posterior
portion of the LEFT breast using a craniocaudal approach. Specimen
radiograph was performed showing calcifications in numerous samples.
Specimens with calcifications are identified for pathology.

At the conclusion of the procedure, X-shaped tissue marker clip was
deployed into the biopsy cavity. Follow-up 2-view mammogram was
performed and dictated separately.

)
IMPRESSION: Stereotactic-guided biopsy of 2 sites of calcifications in the LEFT
breast. No apparent complications.

See separate dictation for ultrasound-guided core biopsy of enlarged
LEFT axillary lymph node.

## 2019-02-26 IMAGING — MG MM BREAST BX W LOC DEV 1ST LESION IMAGE BX SPEC STEREO GUIDE*L*
6 series · 7 of 22 positions shown · non-contrast
Comparison: Previous exams.
COMPARISON: Previous exams.

Addendum:
CLINICAL DATA: Patient presents for stereotactic guided core biopsy
of 2 sites in the LEFT breast.

Biopsy of LEFT axillary lymph node is dictated separately.
EXAM:
LEFT BREAST STEREOTACTIC CORE NEEDLE BIOPSY x2

[L CC (1 of 2)]
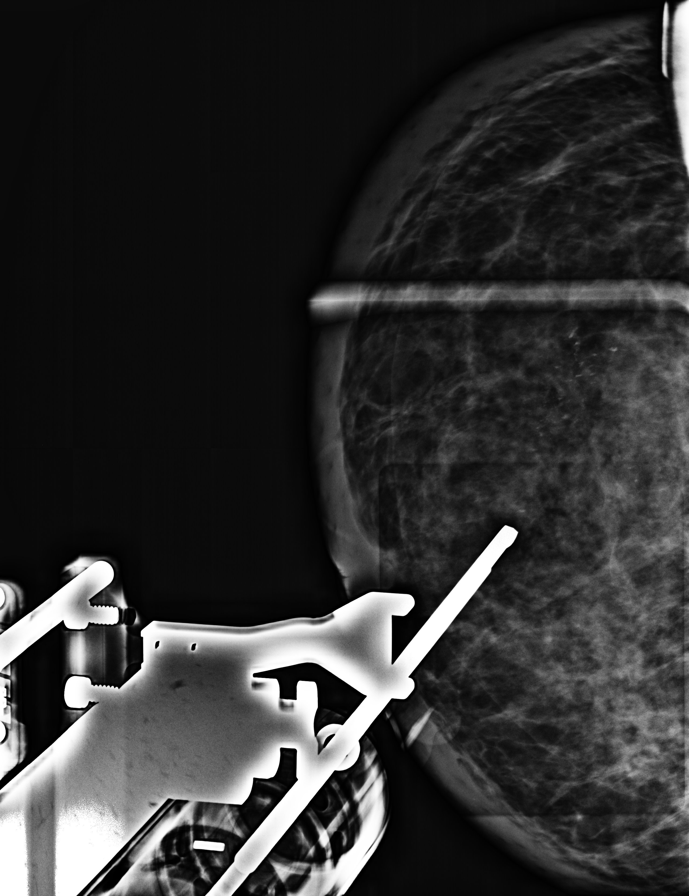

[L CC (2 of 2)]
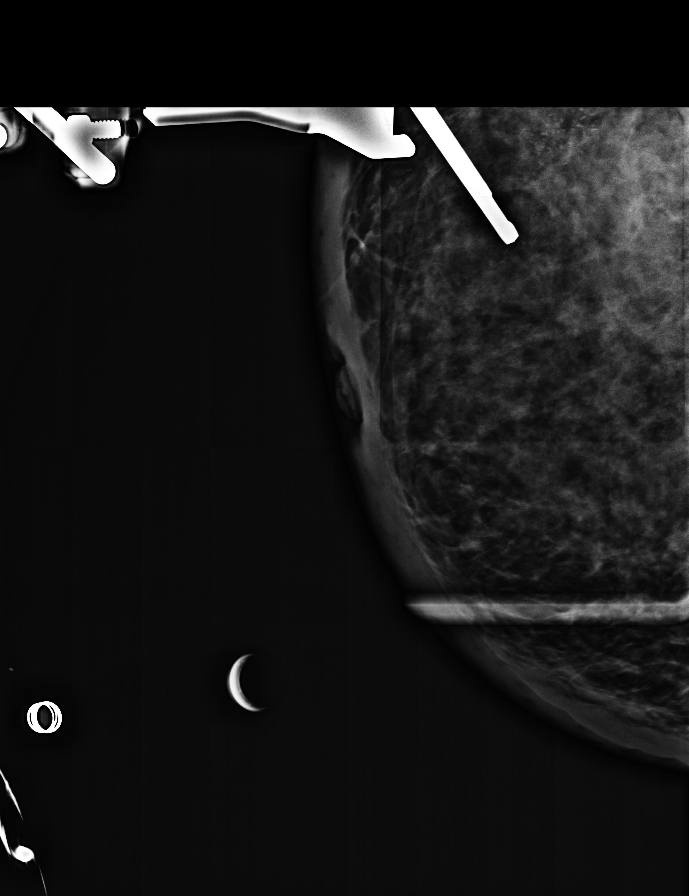

[L CC tomo · 2 of 130 frames shown (1 of 4)]
[frame 42/130]
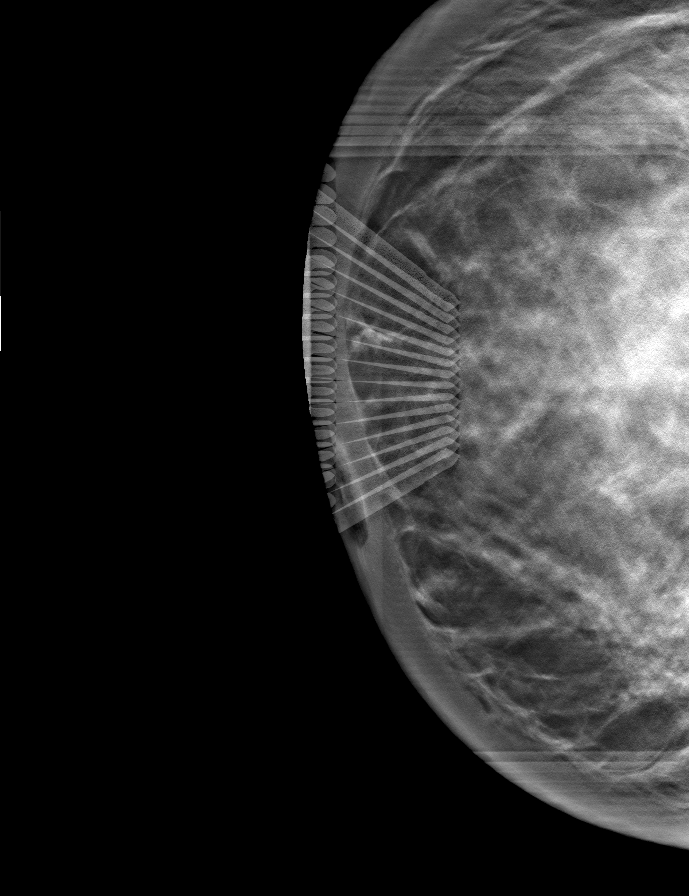
[frame 65/130]
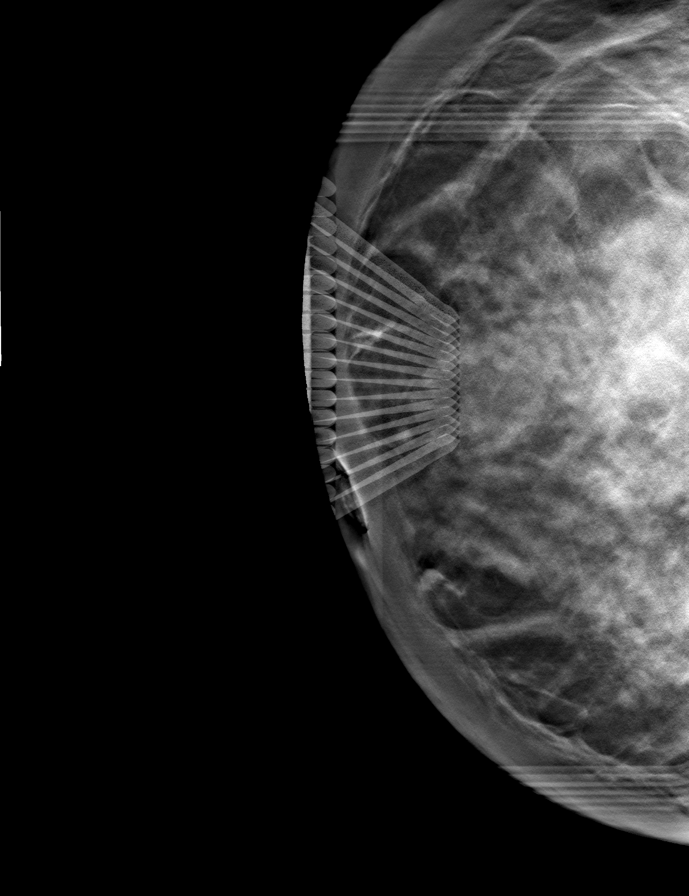

[L CC tomo (2 of 4) · tomo slice 65/130.0]
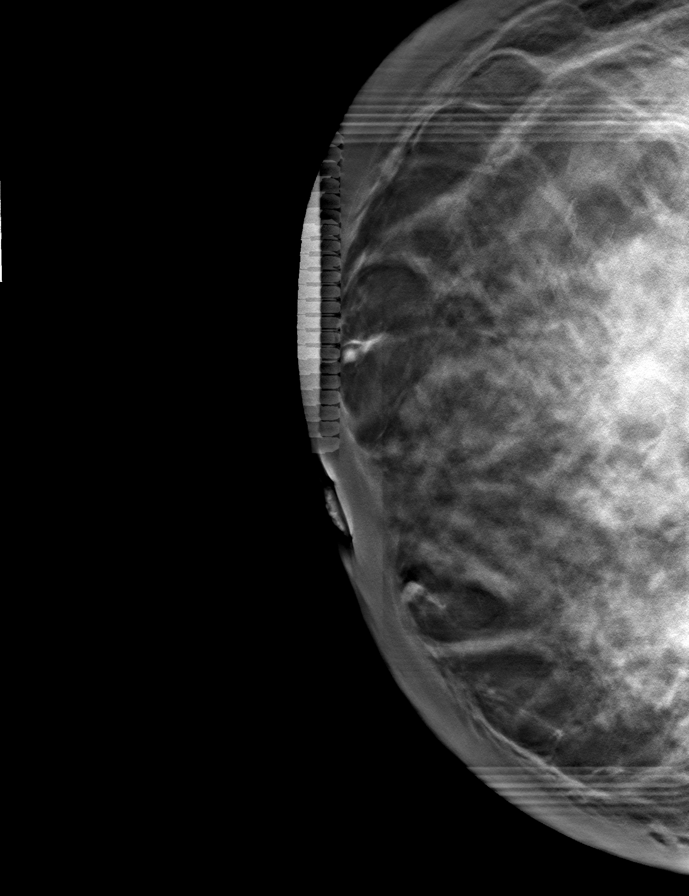

[L CC tomo (3 of 4) · tomo slice 65/130.0]
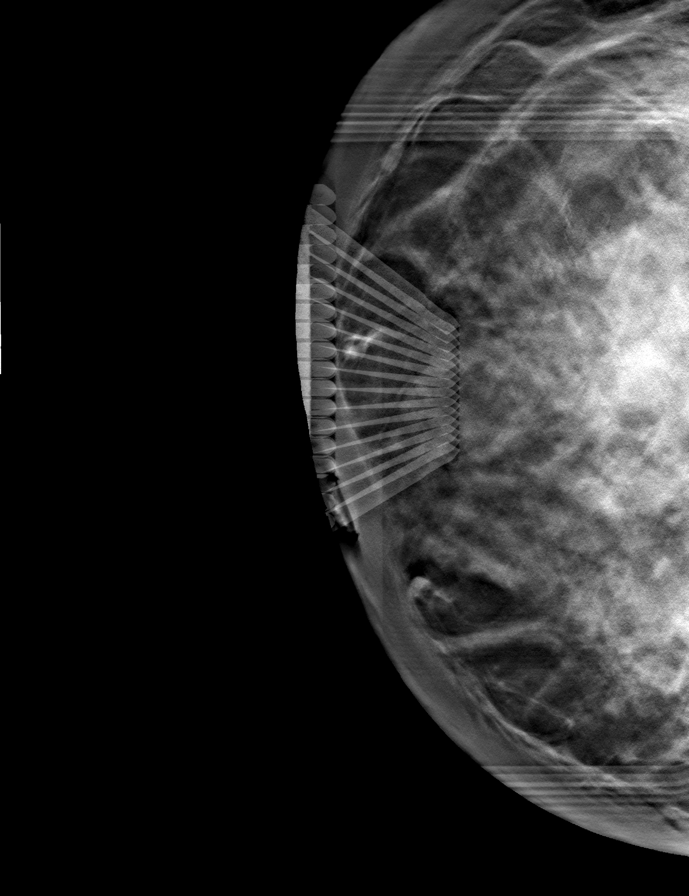

[L CC tomo (4 of 4) · tomo slice 65/129.0]
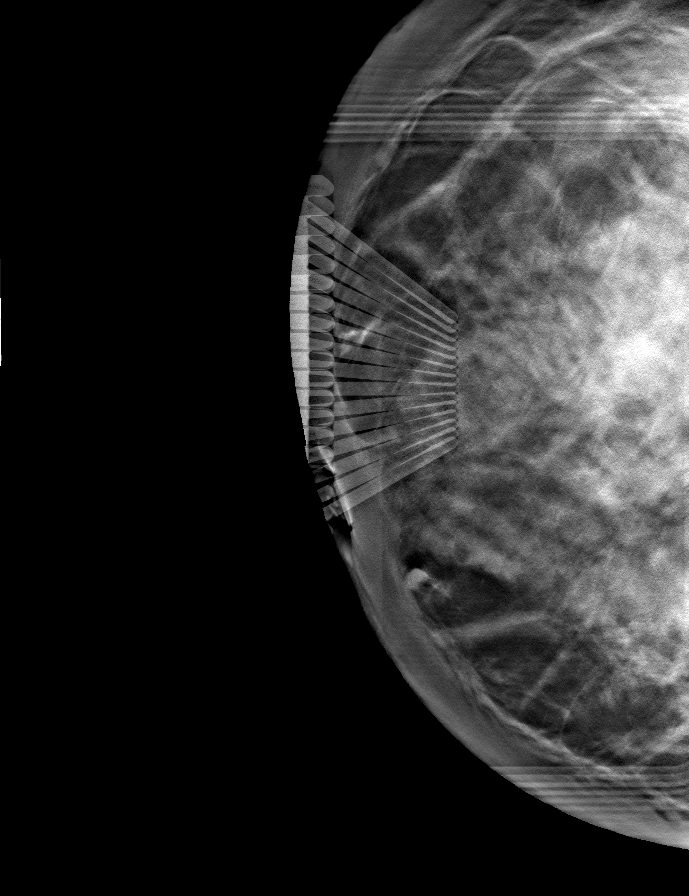

[7 of 22 positions shown; findings below may reference images not displayed]

FINDINGS: The patient and I discussed the procedure of stereotactic-guided
biopsy including benefits and alternatives. We discussed the high
likelihood of a successful procedure. We discussed the safety of the
procedure for her fetus. We discussed the risks of the procedure
including infection, bleeding, tissue injury, clip migration, and
inadequate sampling. Informed written consent was given. The usual
time out protocol was performed immediately prior to the procedure.

Site 1: Coil clip; lesion quadrant: UPPER INNER QUADRANT LEFT
breast- anterior

Using sterile technique, 1% Lidocaine and 1% lidocaine with
epinephrine as local anesthetic, under stereotactic guidance, a 9
gauge vacuum assisted device was used to perform core needle biopsy
of calcifications in the MEDIAL anterior portion of the LEFT breast
using a craniocaudal approach. Specimen radiograph was performed
showing calcifications in numerous samples. Specimens with
calcifications are identified for pathology.

At the conclusion of the procedure, coil shaped tissue marker clip
was deployed into the biopsy cavity. Follow-up 2-view mammogram was
performed and dictated separately.

Site 2: X clip; lesion quadrant: UPPER INNER QUADRANT LEFT breast -
posterior

Using sterile technique and 1% Lidocaine as local anesthetic, under
stereotactic guidance, a 9 gauge vacuum assisted device was used to
perform core needle biopsy of calcifications in the MEDIAL posterior
portion of the LEFT breast using a craniocaudal approach. Specimen
radiograph was performed showing calcifications in numerous samples.
Specimens with calcifications are identified for pathology.

At the conclusion of the procedure, X-shaped tissue marker clip was
deployed into the biopsy cavity. Follow-up 2-view mammogram was
performed and dictated separately.

)
IMPRESSION: Stereotactic-guided biopsy of 2 sites of calcifications in the LEFT
breast. No apparent complications.

See separate dictation for ultrasound-guided core biopsy of enlarged
LEFT axillary lymph node.

ADDENDUM:
PATHOLOGY ADDENDUM:

Pathology:

1. Lymph node, needle/core biopsy, left axillary, spiral hydromark
clip

- INVASIVE MAMMARY CARCINOMA

- NO DEFINITIVE NODAL TISSUE IDENTIFIED

2. Breast, left, needle core biopsy, medial anterior calc coil clip

- INVASIVE MAMMARY CARCINOMA

- DUCTAL CARCINOMA IN SITU

- LYMPHOVASCULAR SPACE INVASION PRESENT

3. Breast, left, needle core biopsy, medial posterior X clip

- INVASIVE MAMMARY CARCINOMA

- DUCTAL CARCINOMA IN SITU

- LYMPHOVASCULAR SPACE INVASION PRESENT

Pathology concordance with imaging findings: ALL 3 SITES ARE
CONCORDANT.

Recommendation: Oncology and surgical consultations. At the request
of the patient, appointment is scheduled with Dr. RUSUL at the [HOSP[facility]] [facility]OS[facility]ITAL] on [DATE]. Follow-up appointment with Dr.
RUSUL be scheduled for the patient.

I spoke with her by telephone on [DATE] at [DATE]. She reports
tenderness at the biopsy sites. For pain relief, patient has been
taking 3-4 Tylenol tablets. I advised the patient not to take more
than 2 Tylenol tablets every 4-6 hours. The patient was given her
appointment time with Dr. RUSUL. The [HOSPITAL] will follow-up
with her.

I am unable to reach Dr. RUSUL afternoon, as her office is
closed for the day.

*** End of Addendum ***
FINDINGS: The patient and I discussed the procedure of stereotactic-guided
biopsy including benefits and alternatives. We discussed the high
likelihood of a successful procedure. We discussed the safety of the
procedure for her fetus. We discussed the risks of the procedure
including infection, bleeding, tissue injury, clip migration, and
inadequate sampling. Informed written consent was given. The usual
time out protocol was performed immediately prior to the procedure.

Site 1: Coil clip; lesion quadrant: UPPER INNER QUADRANT LEFT
breast- anterior

Using sterile technique, 1% Lidocaine and 1% lidocaine with
epinephrine as local anesthetic, under stereotactic guidance, a 9
gauge vacuum assisted device was used to perform core needle biopsy
of calcifications in the MEDIAL anterior portion of the LEFT breast
using a craniocaudal approach. Specimen radiograph was performed
showing calcifications in numerous samples. Specimens with
calcifications are identified for pathology.

At the conclusion of the procedure, coil shaped tissue marker clip
was deployed into the biopsy cavity. Follow-up 2-view mammogram was
performed and dictated separately.

Site 2: X clip; lesion quadrant: UPPER INNER QUADRANT LEFT breast -
posterior

Using sterile technique and 1% Lidocaine as local anesthetic, under
stereotactic guidance, a 9 gauge vacuum assisted device was used to
perform core needle biopsy of calcifications in the MEDIAL posterior
portion of the LEFT breast using a craniocaudal approach. Specimen
radiograph was performed showing calcifications in numerous samples.
Specimens with calcifications are identified for pathology.

At the conclusion of the procedure, X-shaped tissue marker clip was
deployed into the biopsy cavity. Follow-up 2-view mammogram was
performed and dictated separately.

)
IMPRESSION: Stereotactic-guided biopsy of 2 sites of calcifications in the LEFT
breast. No apparent complications.

See separate dictation for ultrasound-guided core biopsy of enlarged
LEFT axillary lymph node.

## 2019-02-26 IMAGING — MG MM BREAST LOCALIZATION CLIP
8 series · 8 of 20 positions shown · non-contrast
Comparison: Previous exam(s).

CLINICAL DATA: Status post ultrasound-guided core biopsy of LEFT
axilla and stereotactic guided core biopsy of 2 sites of
calcifications in the UPPER INNER QUADRANT of the LEFT breast.

EXAM:
DIAGNOSTIC LEFT MAMMOGRAM POST ULTRASOUND AND STEREOTACTIC BIOPSIES

[L SPECIMEN (1 of 2)]
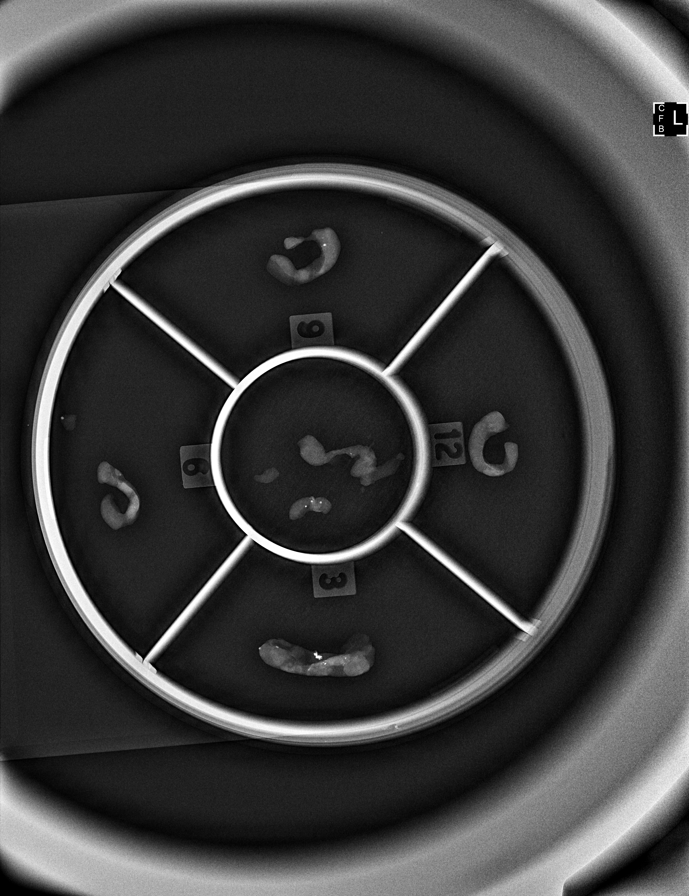

[L SPECIMEN (2 of 2)]
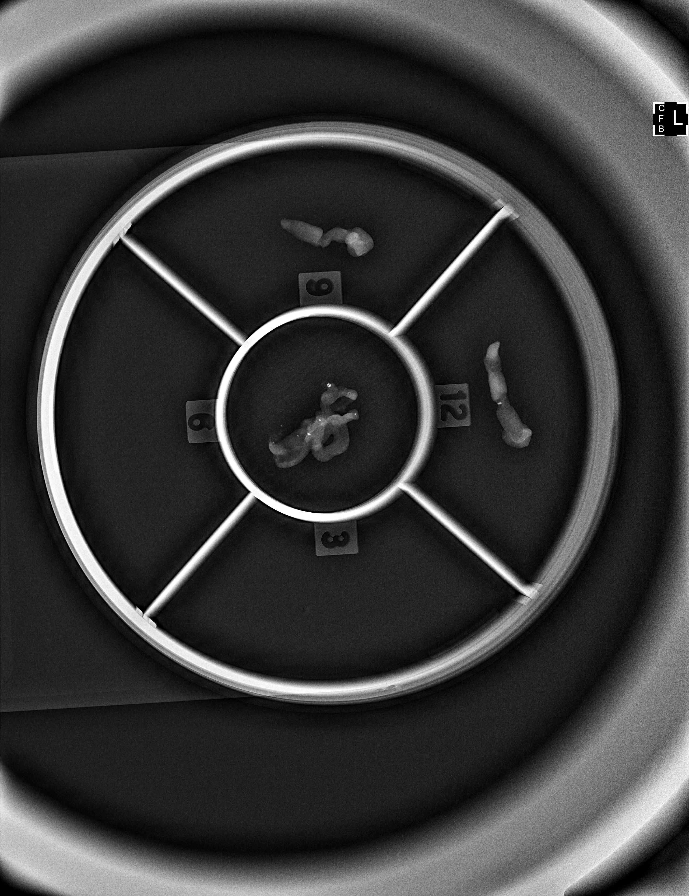

[L ML synth-2D]
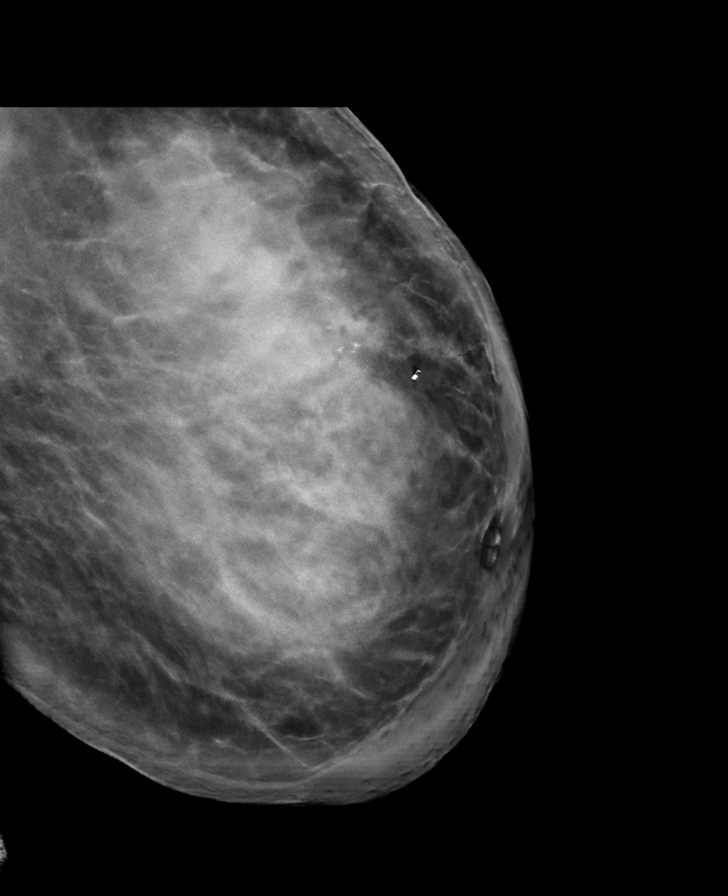

[L MLO synth-2D]
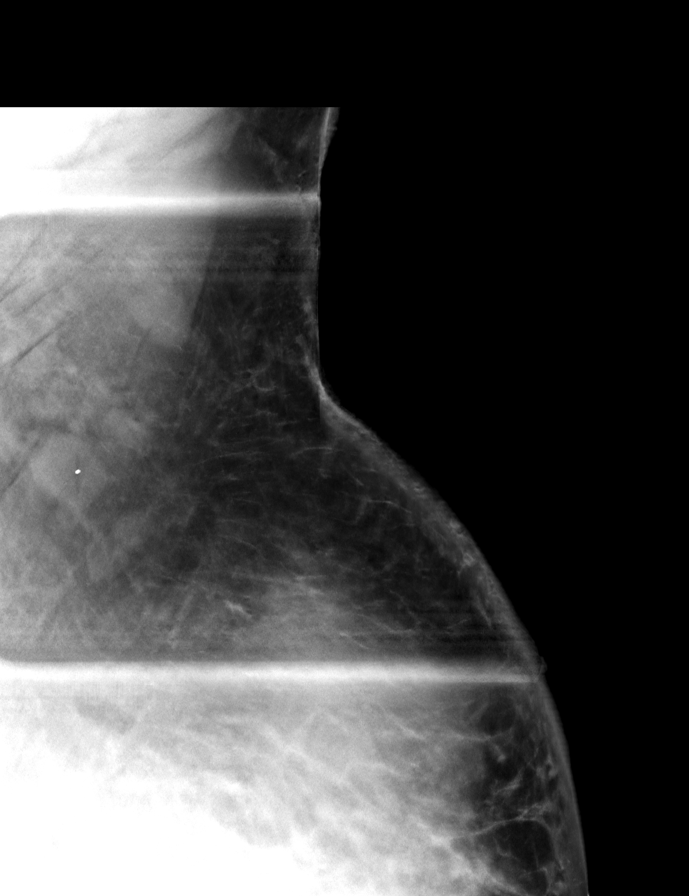

[L CC synth-2D]
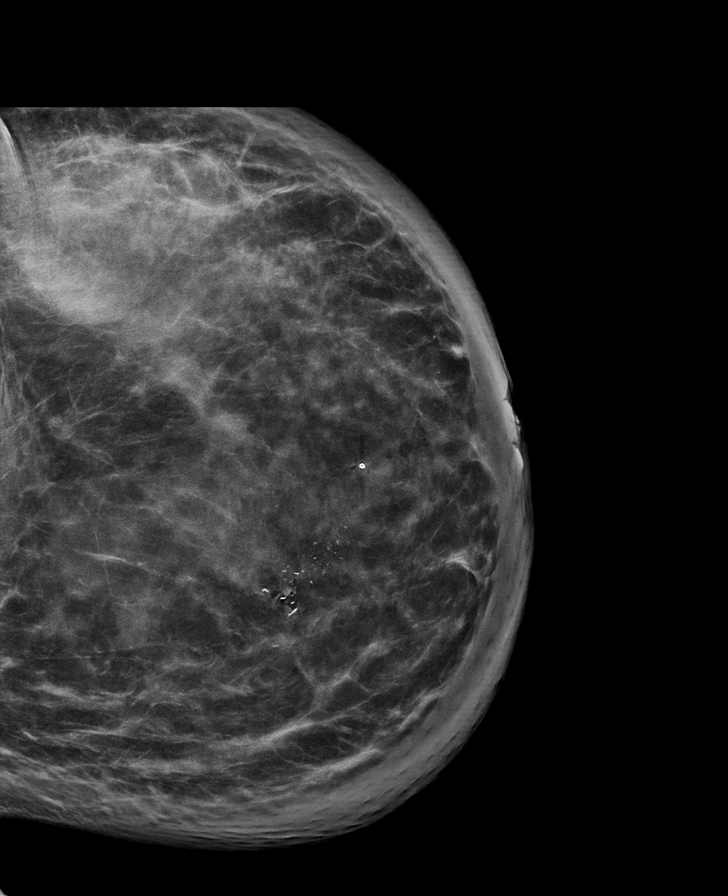

[L CC tomo · tomo slice 65/128.0]
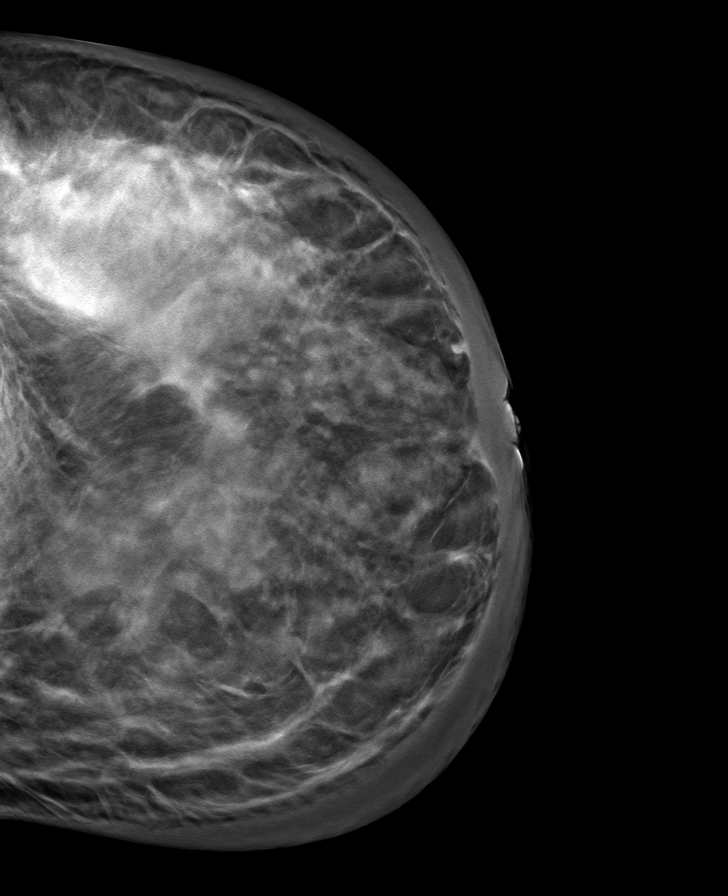

[L ML tomo · tomo slice 79/156.0]
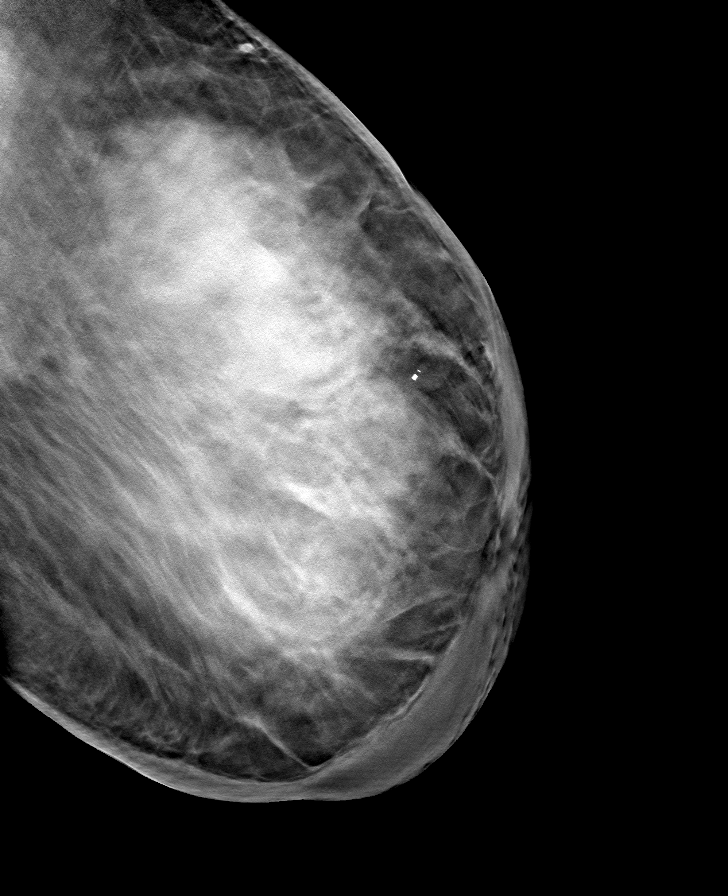

[L MLO tomo · tomo slice 60/119.0]
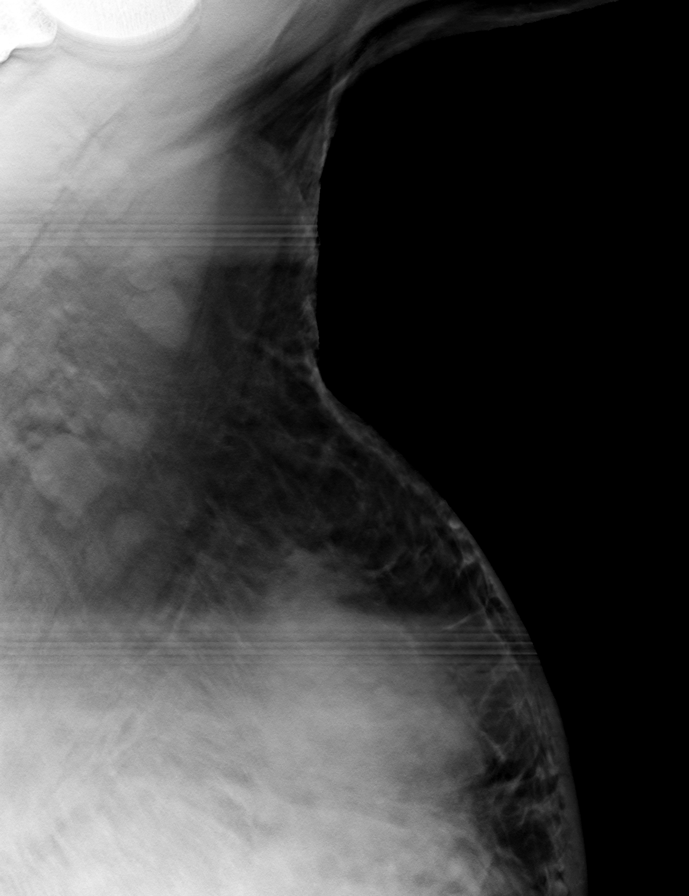

[8 of 20 positions shown; findings below may reference images not displayed]

FINDINGS: Mammographic images were obtained following ultrasound guided biopsy
of LEFT axillary lymph node and placement of spiral shaped clip. The
biopsy marking clip is in expected position in the LEFT axilla.

Following stereotactic guided core biopsy of calcifications in the
UPPER INNER QUADRANT of the LEFT breast, a coil shaped clip is
identified along the anterior margin. An X shaped clip is identified
along the posterior margin. Of note, the X clip is partially
obscured by a residual calcifications.
IMPRESSION: Tissue marker clips are in the expected locations after biopsies.

Final Assessment: Post Procedure Mammograms for Marker Placement

## 2019-02-27 ENCOUNTER — Telehealth: Payer: Self-pay | Admitting: Hematology and Oncology

## 2019-02-27 NOTE — Telephone Encounter (Signed)
A new patient appt has been scheduled for Melissa [name] to see Dr. Lindi Adie on 12/23 at 11am. She's been cld and made aware to arrive 15 minutes early.

## 2019-03-03 ENCOUNTER — Encounter: Payer: Self-pay | Admitting: *Deleted

## 2019-03-03 NOTE — Progress Notes (Signed)
[place] CONSULT NOTE  Patient Care Team: Patient, [name] (General Practice) [name], RN as Oncology Nurse Navigator Rockwell Germany, RN as Oncology Nurse Navigator  CHIEF COMPLAINTS/PURPOSE OF CONSULTATION:  Newly diagnosed breast cancer  HISTORY OF PRESENTING ILLNESS:  Melissa [name] 28 y.o. female is here because of recent diagnosis of invasive mammary carcinoma of the left breast. The patient noted left breast swelling and hardness for 4 months. Diagnostic mammogram on 02/19/19 showed calcifications spanning at least 8cm x 5cm, with 4 abnormal left axillary lymph nodes, and skin thickening. Biopsy on 02/26/19 showed, in the breast, invasive mammary carcinoma with DCIS, grade 3, HER-2 negative by FISH, ER+ 50%, PR+ 30%, Ki67 50%, and in the lymph node, invasive mammary carcinoma, HER-2 negative by FISH, ER+ 60%, PR+ 60%, Ki67 50%. She is currently 4 months pregnant. She presents to the clinic today for initial evaluation and discussion of treatment options.   I reviewed her records extensively and collaborated the history with the patient.  SUMMARY OF ONCOLOGIC HISTORY: Oncology History  Malignant neoplasm of overlapping sites of left breast in female, estrogen receptor positive ([place])  02/26/2019 Initial Diagnosis   Pregnant lady with 75-monthhistory of left breast swelling and skin thickening, mammogram showed pleomorphic calcifications 8 cm, axilla lymph node biopsy positive ER 60%, PR 60%, Ki-67 50%, HER-2 negative ratio 1.25, anterior and posterior end of the calcifications biopsied: 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 ER 50%, PR 30%, Ki-67 50%, HER-2 negative ratio 1.03   03/04/2019 Cancer Staging   Staging form: Breast, AJCC 8th Edition - Clinical stage from 03/04/2019: Stage IIIB (cT4, cN1, cM0, G3, ER+, PR+, HER2-) - Signed by GNicholas Lose MD on 03/04/2019     MEDICAL HISTORY:  Past Medical History:  Diagnosis  Date  . Abnormal Pap smear 07/13/11   ASCUS +HPV  . Herpes simplex without mention of complication    dx age 28   SURGICAL HISTORY: Past Surgical History:  Procedure Laterality Date  . COLPOSCOPY     CIN I ECC -    SOCIAL HISTORY: Social History   Socioeconomic History  . Marital status: Single    Spouse name: Not on file  . Number of children: Not on file  . Years of education: Not on file  . Highest education level: Not on file  Occupational History  . Not on file  Tobacco Use  . Smoking status: Current Some Day Smoker    Types: Cigarettes  . Smokeless tobacco: Never Used  Substance and Sexual Activity  . Alcohol use: Yes  . Drug use: No  . Sexual activity: Yes    Birth control/protection: None  Other Topics Concern  . Not on file  Social History Narrative  . Not on file   Social Determinants of Health   Financial Resource Strain:   . Difficulty of Paying Living Expenses: Not on file  Food Insecurity:   . Worried About RCharity fundraiserin the Last Year: Not on file  . Ran Out of Food in the Last Year: Not on file  Transportation Needs:   . Lack of Transportation (Medical): Not on file  . Lack of Transportation (Non-Medical): Not on file  Physical Activity:   . Days of Exercise per Week: Not on file  . Minutes of Exercise per Session: Not on file  Stress:   . Feeling of Stress : Not on file  Social Connections:   .  Frequency of Communication with Friends and Family: Not on file  . Frequency of Social Gatherings with Friends and Family: Not on file  . Attends Religious Services: Not on file  . Active Member of Clubs or Organizations: Not on file  . Attends Archivist Meetings: Not on file  . Marital Status: Not on file  Intimate Partner Violence:   . Fear of Current or Ex-Partner: Not on file  . Emotionally Abused: Not on file  . Physically Abused: Not on file  . Sexually Abused: Not on file    FAMILY HISTORY: Family History  Problem  Relation Age of Onset  . Cancer Paternal Grandmother 78       breast  . Stroke Father 59  . Hypertension Father     ALLERGIES:  has No Known Allergies.  MEDICATIONS:  Current Outpatient Medications  Medication Sig Dispense Refill  . acetaminophen (TYLENOL) 500 MG tablet Take 500 mg by mouth every 6 (six) hours as needed.      . desogestrel-ethinyl estradiol (KARIVA,AZURETTE,MIRCETTE) 0.15-0.02/0.01 MG (21/5) tablet Take 1 tablet by mouth daily. 1 Package 6  . FOLIC ACID PO Take by mouth. occassionally     Current Facility-Administered Medications  Medication Dose Route Frequency Provider Last Rate Last Admin  . ibuprofen (ADVIL,MOTRIN) tablet 800 mg  800 mg Oral Once Lynelle Smoke, DO        REVIEW OF SYSTEMS:   Constitutional: Denies fevers, chills or abnormal night sweats Eyes: Denies blurriness of vision, double vision or watery eyes Ears, nose, mouth, throat, and face: Denies mucositis or sore throat Respiratory: Denies cough, dyspnea or wheezes Cardiovascular: Denies palpitation, chest discomfort or lower extremity swelling Gastrointestinal:  Denies nausea, heartburn or change in bowel habits Skin: Denies abnormal skin rashes Lymphatics: Denies new lymphadenopathy or easy bruising Neurological:Denies numbness, tingling or new weaknesses Behavioral/Psych: Mood is stable, no new changes  Breast: Left breast skin thickening All other systems were reviewed with the patient and are negative.  PHYSICAL EXAMINATION: ECOG PERFORMANCE STATUS: 1 - Symptomatic but completely ambulatory  Vitals:   03/04/19 1041  BP: 133/68  Pulse: 84  Resp: 20  Temp: 98.2 F (36.8 C)  SpO2: 100%   Filed Weights   03/04/19 1000  Weight: 200 lb 3 oz (90.8 kg)    GENERAL:alert, no distress and comfortable SKIN: skin color, texture, turgor are normal, no rashes or significant lesions EYES: normal, conjunctiva are pink and non-injected, sclera clear OROPHARYNX:no exudate, no erythema  and lips, buccal mucosa, and tongue normal  NECK: supple, thyroid normal size, non-tender, without nodularity LYMPH:  no palpable lymphadenopathy in the cervical, axillary or inguinal LUNGS: clear to auscultation and percussion with normal breathing effort HEART: regular rate & rhythm and no murmurs and no lower extremity edema ABDOMEN:abdomen soft, non-tender and normal bowel sounds Musculoskeletal:no cyanosis of digits and no clubbing  PSYCH: alert & oriented x 3 with fluent speech NEURO: no focal motor/sensory deficits BREAST: Massively enlarged left breast with Peau de orange appearance and markedly firm nodules and darkened discoloration of the skin. No palpable axillary or supraclavicular lymphadenopathy (exam performed in the presence of a chaperone)   LABORATORY DATA:    RADIOGRAPHIC STUDIES: I have personally reviewed the radiological reports and agreed with the findings in the report.  ASSESSMENT AND PLAN:  Malignant neoplasm of overlapping sites of left breast in female, estrogen receptor positive ([place]) 02/26/2019:Pregnant lady with 1-monthhistory of left breast swelling and skin thickening, mammogram showed pleomorphic calcifications 8  cm, axilla lymph node biopsy positive ER 60%, PR 60%, Ki-67 50%, HER-2 negative ratio 1.25, anterior and posterior end of the calcifications biopsied: 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 After reviewing the pathology in detail, we proceeded to discuss the different treatment options between surgery, radiation, chemotherapy, antiestrogen therapies.  Treatment plan: 1.  Neoadjuvant chemotherapy with dose dense Adriamycin  and Cytoxan will be given every 3 weeks without Neulasta support given her pregnancy 2.  After delivery, weekly Taxol x12 versus Taxotere and Cytoxan depending on response 3.  Followed by mastectomy if skin biopsy is positive and targeted node dissection versus full axillary lymph node dissection depending on the response. 4.  Adjuvant radiation therapy 5.  Followed by adjuvant antiestrogen therapy with complete estrogen blockade (ovarian suppression with AI)  Chemotherapy Counseling: I discussed the risks and benefits of chemotherapy including the risks of nausea/ vomiting, risk of infection from low WBC count, fatigue due to chemo or anemia, bruising or bleeding due to low platelets, mouth sores, loss/ change in taste and decreased appetite. Liver and kidney function will be monitored through out chemotherapy as abnormalities in liver and kidney function may be a side effect of treatment. Cardiac dysfunction due to Adriamycin and neuropathy risk with Taxol were discussed in detail. Risk of permanent bone marrow dysfunction and leukemia due to chemo were also discussed.  We also discussed the risks to the baby including pregnancy losses as a result of chemotherapy.  Plan: 1.  Breast MRI cannot be performed in the second trimester.  Therefore it will not be done. CT scans and bone scans also cannot be performed the second trimester.  We will do this after delivery and before surgery. 2.  Port placement 3.  Echocardiogram 4.  Chemo class Genetic counseling and testing  Plan to start chemotherapy in 1 to 2 weeks  All questions were answered. The patient knows to call the clinic with any problems, questions or concerns.   Rulon Eisenmenger, MD, MPH 03/04/2019    I, Molly Dorshimer, am acting as scribe for Nicholas Lose, MD.  I have reviewed the above documentation for accuracy and completeness, and I agree with the above.

## 2019-03-04 ENCOUNTER — Encounter: Payer: Self-pay | Admitting: *Deleted

## 2019-03-04 ENCOUNTER — Encounter: Payer: Self-pay | Admitting: General Practice

## 2019-03-04 ENCOUNTER — Encounter: Payer: Self-pay | Admitting: Adult Health

## 2019-03-04 ENCOUNTER — Inpatient Hospital Stay: Payer: Medicaid Other | Attending: Hematology and Oncology | Admitting: Hematology and Oncology

## 2019-03-04 ENCOUNTER — Other Ambulatory Visit: Payer: Self-pay

## 2019-03-04 DIAGNOSIS — Z170 Estrogen receptor positive status [ER+]: Secondary | ICD-10-CM | POA: Insufficient documentation

## 2019-03-04 DIAGNOSIS — Z8249 Family history of ischemic heart disease and other diseases of the circulatory system: Secondary | ICD-10-CM | POA: Diagnosis not present

## 2019-03-04 DIAGNOSIS — F17210 Nicotine dependence, cigarettes, uncomplicated: Secondary | ICD-10-CM | POA: Diagnosis not present

## 2019-03-04 DIAGNOSIS — C50812 Malignant neoplasm of overlapping sites of left female breast: Secondary | ICD-10-CM | POA: Insufficient documentation

## 2019-03-04 DIAGNOSIS — Z791 Long term (current) use of non-steroidal anti-inflammatories (NSAID): Secondary | ICD-10-CM | POA: Insufficient documentation

## 2019-03-04 NOTE — Progress Notes (Signed)
[facility] CSW Progress Notes  Request received to talk with patient in exam room.  New diagnosis of Stage 3 breast cancer, patient also in second trimester of pregnancy.  Major concern today is determining what her pregnancy Medicaid will cover in terms of her cancer care.  Patient asks that I contact [facility] on her behalf - VM left requesting call back.  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  Has one year old daughter in the home.  Lives by herself w daughter; however, family lives nearby and checks on her frequently.  Sister accompanies her today and expresses significant support for patient.  Patient and sister cautioned on impact of traumatic news on patient's concentration - cautioned on driving and similar. Patient will take time to process this upsetting news and was encouraged to focus on self care at this time.  Can be given resources for outside financial support at subsequent visits, can also be assessed for [employer] by [employer] once treatment plan is in place.  CSWs [place] and Wallis B[place]berg will be available next week to assist with any patient care needs.  Edwyna Shell, LCSW Clinical Social Worker Phone:  [phone] Cell:  [phone]

## 2019-03-04 NOTE — Assessment & Plan Note (Addendum)
02/26/2019:Pregnant lady with 62-monthhistory of left breast swelling and skin thickening, mammogram showed pleomorphic calcifications 8 cm, axilla lymph node biopsy positive ER 60%, PR 60%, Ki-67 50%, HER-2 negative ratio 1.25, anterior and posterior end of the calcifications biopsied: 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 After reviewing the pathology in detail, we proceeded to discuss the different treatment options between surgery, radiation, chemotherapy, antiestrogen therapies.  Treatment plan: 1.  Neoadjuvant chemotherapy with dose dense Adriamycin and Cytoxan will be given every 3 weeks without Neulasta support given her pregnancy 2.  After delivery, weekly Taxol x12 3.  Followed by mastectomy if skin biopsy is positive and targeted node dissection versus full axillary lymph node dissection depending on the response. 4.  Adjuvant radiation therapy 5.  Followed by adjuvant antiestrogen therapy with complete estrogen blockade (ovarian suppression with AI)  Chemotherapy Counseling: I discussed the risks and benefits of chemotherapy including the risks of nausea/ vomiting, risk of infection from low WBC count, fatigue due to chemo or anemia, bruising or bleeding due to low platelets, mouth sores, loss/ change in taste and decreased appetite. Liver and kidney function will be monitored through out chemotherapy as abnormalities in liver and kidney function may be a side effect of treatment. Cardiac dysfunction due to Adriamycin and neuropathy risk with Taxol were discussed in detail. Risk of permanent bone marrow dysfunction and leukemia due to chemo were also discussed.  We  also discussed the risks to the baby including pregnancy losses as a result of chemotherapy.  Plan: 1.  Breast MRI cannot be performed in the second trimester.  Therefore it will not be done. CT scans and bone scans also cannot be performed the second trimester.  We will do this after delivery and before surgery. 2.  Port placement 3.  Echocardiogram 4.  Chemo class  Plan to start chemotherapy in 1 to 2 weeks

## 2019-03-04 NOTE — Progress Notes (Signed)
START ON PATHWAY REGIMEN - Breast   Doxorubicin + Cyclophosphamide (AC):   A cycle is every 21 days:     Doxorubicin      Cyclophosphamide   **Always confirm dose/schedule in your pharmacy ordering system**  Paclitaxel 80 mg/m2 Weekly:   Administer weekly:     Paclitaxel   **Always confirm dose/schedule in your pharmacy ordering system**  Patient Characteristics: Preoperative or Nonsurgical Candidate (Clinical Staging), Neoadjuvant Therapy followed by Surgery, Invasive Disease, Chemotherapy, HER2 Negative/Unknown/Equivocal, ER Positive Therapeutic Status: Preoperative or Nonsurgical Candidate (Clinical Staging) AJCC M Category: cM0 AJCC Grade: G3 Breast Surgical Plan: Neoadjuvant Therapy followed by Surgery ER Status: Positive (+) AJCC 8 Stage Grouping: IIIB HER2 Status: Negative (-) AJCC T Category: cT4 AJCC N Category: cN1 PR Status: Positive (+) Intent of Therapy: Curative Intent, Discussed with Patient

## 2019-03-04 NOTE — Progress Notes (Signed)
[facility] CSW Progress Notes  Per [place] Medicaid, patient's Medicaid for Pregnant Women will only cover medical expenses related to her pregnancy.  She will need to apply for full Medicaid at her local DSS in order to have her cancer treatments covered under Medicaid.  Spoke w patient and advised her to call her Medicaid worker at [employer]  Edwyna Shell, LCSW Clinical Social Worker Phone:  [phone]

## 2019-03-09 ENCOUNTER — Other Ambulatory Visit: Payer: Self-pay | Admitting: General Surgery

## 2019-03-10 ENCOUNTER — Encounter: Payer: Self-pay | Admitting: General Practice

## 2019-03-10 NOTE — Progress Notes (Signed)
[facility] CSW Progress Notes  Call from [place] Medicaid worker - she has spoken w patient.  States that patient's Medicaid will transition to full Medicaid effective 03/13/19.  Treatment team notified.  Edwyna Shell, LCSW Clinical Social Worker Phone:  [phone] Cell:  [phone]

## 2019-03-10 NOTE — Progress Notes (Signed)
[facility] CSW Progress Notes   Call from [place] - patient has assigned Medicaid worker Benjamine Mola [place] [phone]).  VM left for this worker - information also provided to patient by VM so she can contact her worker directly also.  Edwyna Shell, LCSW Clinical Social Worker Phone:  [phone] Cell:  [phone]

## 2019-03-11 ENCOUNTER — Encounter: Payer: Self-pay | Admitting: *Deleted

## 2019-03-11 ENCOUNTER — Encounter: Payer: Self-pay | Admitting: Hematology and Oncology

## 2019-03-11 NOTE — Progress Notes (Signed)
Called patient to introduce myself as Arboriculturist and to offer available resources.  Discussed one-time $1000 Radio broadcast assistant to assist with personal expenses while going through treatment. Advised what is needed to apply. She will bring recent income on 03/17/19. Will also give her my card on that day.

## 2019-03-14 ENCOUNTER — Other Ambulatory Visit (HOSPITAL_COMMUNITY)
Admission: RE | Admit: 2019-03-14 | Discharge: 2019-03-14 | Disposition: A | Discharge status: Home / Self Care | Payer: Medicaid Other | Source: Ambulatory Visit | Attending: General Surgery | Admitting: General Surgery

## 2019-03-14 DIAGNOSIS — Z20822 Contact with and (suspected) exposure to covid-19: Secondary | ICD-10-CM | POA: Diagnosis not present

## 2019-03-14 DIAGNOSIS — Z01812 Encounter for preprocedural laboratory examination: Secondary | ICD-10-CM | POA: Diagnosis not present

## 2019-03-16 ENCOUNTER — Encounter: Payer: Self-pay | Admitting: *Deleted

## 2019-03-16 ENCOUNTER — Other Ambulatory Visit: Payer: Self-pay

## 2019-03-16 ENCOUNTER — Other Ambulatory Visit: Payer: Self-pay | Admitting: *Deleted

## 2019-03-16 DIAGNOSIS — C50812 Malignant neoplasm of overlapping sites of left female breast: Secondary | ICD-10-CM

## 2019-03-16 DIAGNOSIS — Z170 Estrogen receptor positive status [ER+]: Secondary | ICD-10-CM

## 2019-03-16 LAB — NOVEL CORONAVIRUS, NAA (HOSP ORDER, SEND-OUT TO REF LAB; TAT 18-24 HRS): SARS-CoV-2, NAA: NOT DETECTED

## 2019-03-16 MED ORDER — PROCHLORPERAZINE MALEATE 10 MG PO TABS: 10.0000 mg | ORAL_TABLET | Freq: Four times a day (QID) | ORAL | 0 refills | Status: DC | PRN

## 2019-03-16 MED ORDER — ONDANSETRON HCL 8 MG PO TABS: 8.0000 mg | ORAL_TABLET | Freq: Three times a day (TID) | ORAL | 0 refills | Status: DC | PRN

## 2019-03-17 ENCOUNTER — Encounter: Payer: Self-pay | Admitting: Hematology and Oncology

## 2019-03-17 ENCOUNTER — Ambulatory Visit (HOSPITAL_COMMUNITY)
Admission: RE | Admit: 2019-03-17 | Discharge: 2019-03-17 | Disposition: A | Discharge status: Home / Self Care | Payer: Medicaid Other | Source: Ambulatory Visit | Attending: Hematology and Oncology | Admitting: Hematology and Oncology

## 2019-03-17 ENCOUNTER — Encounter: Payer: Self-pay | Admitting: *Deleted

## 2019-03-17 ENCOUNTER — Inpatient Hospital Stay: Payer: Medicaid Other

## 2019-03-17 ENCOUNTER — Other Ambulatory Visit: Payer: Self-pay

## 2019-03-17 ENCOUNTER — Encounter (HOSPITAL_COMMUNITY): Payer: Self-pay | Admitting: General Surgery

## 2019-03-17 ENCOUNTER — Telehealth: Payer: Self-pay | Admitting: Hematology and Oncology

## 2019-03-17 ENCOUNTER — Inpatient Hospital Stay: Payer: Medicaid Other | Admitting: *Deleted

## 2019-03-17 ENCOUNTER — Inpatient Hospital Stay: Payer: Medicaid Other | Attending: Hematology and Oncology

## 2019-03-17 DIAGNOSIS — Z3A22 22 weeks gestation of pregnancy: Secondary | ICD-10-CM | POA: Diagnosis not present

## 2019-03-17 DIAGNOSIS — Z3A25 25 weeks gestation of pregnancy: Secondary | ICD-10-CM | POA: Insufficient documentation

## 2019-03-17 DIAGNOSIS — Z01810 Encounter for preprocedural cardiovascular examination: Secondary | ICD-10-CM | POA: Insufficient documentation

## 2019-03-17 DIAGNOSIS — C50812 Malignant neoplasm of overlapping sites of left female breast: Secondary | ICD-10-CM | POA: Insufficient documentation

## 2019-03-17 DIAGNOSIS — L299 Pruritus, unspecified: Secondary | ICD-10-CM | POA: Diagnosis not present

## 2019-03-17 DIAGNOSIS — Z170 Estrogen receptor positive status [ER+]: Secondary | ICD-10-CM | POA: Diagnosis not present

## 2019-03-17 DIAGNOSIS — Z79899 Other long term (current) drug therapy: Secondary | ICD-10-CM | POA: Diagnosis not present

## 2019-03-17 DIAGNOSIS — Z87891 Personal history of nicotine dependence: Secondary | ICD-10-CM | POA: Insufficient documentation

## 2019-03-17 DIAGNOSIS — Z5111 Encounter for antineoplastic chemotherapy: Secondary | ICD-10-CM | POA: Insufficient documentation

## 2019-03-17 DIAGNOSIS — O9A112 Malignant neoplasm complicating pregnancy, second trimester: Secondary | ICD-10-CM | POA: Diagnosis not present

## 2019-03-17 DIAGNOSIS — L02213 Cutaneous abscess of chest wall: Secondary | ICD-10-CM | POA: Diagnosis not present

## 2019-03-17 LAB — CBC WITH DIFFERENTIAL (CANCER CENTER ONLY)
Abs Immature Granulocytes: 0.09 10*3/uL — ABNORMAL HIGH (ref 0.00–0.07)
Basophils Absolute: 0 10*3/uL (ref 0.0–0.1)
Basophils Relative: 0 %
Eosinophils Absolute: 0.1 10*3/uL (ref 0.0–0.5)
Eosinophils Relative: 1 %
HCT: 37.4 % (ref 36.0–46.0)
Hemoglobin: 12.5 g/dL (ref 12.0–15.0)
Immature Granulocytes: 1 %
Lymphocytes Relative: 17 %
Lymphs Abs: 1.7 10*3/uL (ref 0.7–4.0)
MCH: 30.5 pg (ref 26.0–34.0)
MCHC: 33.4 g/dL (ref 30.0–36.0)
MCV: 91.2 fL (ref 80.0–100.0)
Monocytes Absolute: 0.6 10*3/uL (ref 0.1–1.0)
Monocytes Relative: 6 %
Neutro Abs: 7.6 10*3/uL (ref 1.7–7.7)
Neutrophils Relative %: 75 %
Platelet Count: 225 10*3/uL (ref 150–400)
RBC: 4.1 MIL/uL (ref 3.87–5.11)
RDW: 13.8 % (ref 11.5–15.5)
WBC Count: 10.1 10*3/uL (ref 4.0–10.5)
nRBC: 0 % (ref 0.0–0.2)

## 2019-03-17 LAB — CMP (CANCER CENTER ONLY)
ALT: 9 U/L (ref 0–44)
AST: 13 U/L — ABNORMAL LOW (ref 15–41)
Albumin: 3.2 g/dL — ABNORMAL LOW (ref 3.5–5.0)
Alkaline Phosphatase: 70 U/L (ref 38–126)
Anion gap: 10 (ref 5–15)
BUN: 9 mg/dL (ref 6–20)
CO2: 22 mmol/L (ref 22–32)
Calcium: 8.8 mg/dL — ABNORMAL LOW (ref 8.9–10.3)
Chloride: 104 mmol/L (ref 98–111)
Creatinine: 0.62 mg/dL (ref 0.44–1.00)
GFR, Est AFR Am: >60 mL/min (ref >60)
GFR, Estimated: >60 mL/min (ref >60)
Glucose, Bld: 78 mg/dL (ref 70–99)
Potassium: 3.9 mmol/L (ref 3.5–5.1)
Sodium: 136 mmol/L (ref 135–145)
Total Bilirubin: 0.3 mg/dL (ref 0.3–1.2)
Total Protein: 7.2 g/dL (ref 6.5–8.1)

## 2019-03-17 NOTE — Progress Notes (Signed)
Echocardiogram 2D Echocardiogram has been performed.  Melissa [name] 03/17/2019, 10:01 AM

## 2019-03-17 NOTE — Progress Notes (Signed)
Met with patient again by elevators after class to go over in detail [employer] expenses and how they are covered.  Gave her a copy of the approval letter as well as the expense sheet and Outpatient pharmacy information. Also gave her my card for any additional financial questions or concerns.

## 2019-03-17 NOTE — Progress Notes (Signed)
[place] Work  Holiday representative met with patient at [facility] after chemo education to offer support and explore resources.  Patient confirmed that her Medicaid has been transitioned to full Medicaid.  CSW and patient discussed additional financial resources available.  CSW and patient reviewed applications for [employer] and [employer].  Patient plans to collected required documentation for the [facility] application and contact CSW.  CSW completed and submitted [employer] application.  CSW provided patient with contact information and information on the [facility] support team.  CSW encouraged patient to call with questions or concerns.   Johnnye Lana, MSW, LCSW, OSW-C Clinical Social Worker [facility] [phone]

## 2019-03-17 NOTE — Anesthesia Preprocedure Evaluation (Addendum)
Anesthesia Evaluation  Patient identified by MRN, date of birth, ID band Patient awake    Reviewed: Allergy & Precautions, NPO status , Patient's Chart, lab work & pertinent test results  Airway Mallampati: II  TM Distance: >3 FB Neck ROM: Full    Dental no notable dental hx.    Pulmonary neg pulmonary ROS, Current Smoker, former smoker,    Pulmonary exam normal breath sounds clear to auscultation       Cardiovascular negative cardio ROS Normal cardiovascular exam Rhythm:Regular Rate:Normal     Neuro/Psych negative neurological ROS  negative psych ROS   GI/Hepatic negative GI ROS, Neg liver ROS,   Endo/Other  negative endocrine ROS  Renal/GU negative Renal ROS  negative genitourinary   Musculoskeletal negative musculoskeletal ROS (+)   Abdominal   Peds negative pediatric ROS (+)  Hematology negative hematology ROS (+)   Anesthesia Other Findings   Reproductive/Obstetrics negative OB ROS                            Anesthesia Physical Anesthesia Plan  ASA: II  Anesthesia Plan: General   Post-op Pain Management:    Induction: Intravenous and Rapid sequence  PONV Risk Score and Plan: 3 and Ondansetron, Dexamethasone and Treatment may vary due to age or medical condition  Airway Management Planned: Oral ETT  Additional Equipment:   Intra-op Plan:   Post-operative Plan: Extubation in OR  Informed Consent: I have reviewed the patients History and Physical, chart, labs and discussed the procedure including the risks, benefits and alternatives for the proposed anesthesia with the patient or authorized representative who has indicated his/her understanding and acceptance.     Dental advisory given  Plan Discussed with: CRNA and Surgeon  Anesthesia Plan Comments: (Pt pregnant, 23wk 4d gestational age. OB/GYN is Corliss Parish, MD at [facility], she is aware of pt surgery. Pt  will need periop FHT, Rolla Flatten, RN aware, rapid response ob will be contacted on DOS. )       Anesthesia Quick Evaluation

## 2019-03-17 NOTE — Progress Notes (Signed)
Ms Segers denies chest pain or shortness of breath. Ms Kozikowski's Covid test was negative on 03/14/2019, patient has been in quarantine at home, except for medical appointments. I called [facility] and asked for  Triage nurse to inform Dr [name] that he, the surgeon needs to notify patient's OB MD and ask for monotoing orders. Dr [name] also needs to notified as well as the Neonatal MD and anesthesiologist. I spoke to Karoline Caldwell, [place] for anesthesiologist PA- C and informed him.  Jeneen Rinks notified me that Dr [name] called Jeneen Rinks and asked Jeneen Rinks to take care of it. Jeneen Rinks spoke with Rolla Flatten, RN AD to inform her , Pam told Jeneen Rinks that she- Pam will take care of things in am.

## 2019-03-17 NOTE — Progress Notes (Signed)
Met with patient at registration to obtain income for one-time [employer].  Patient approved for one-time $1000 [employer] grant to assist with personal expenses while going through treatment.  Patient will receive a copy of approval letter and expense sheet along with Outpatient pharmacy information. She will also receive my card for any additional financial questions or concerns. She proceeded to class and I will review with patient after class.

## 2019-03-17 NOTE — Telephone Encounter (Signed)
Scheduled appt per 12/30 sch message - pt to get an updated schedule at chemo edu

## 2019-03-18 ENCOUNTER — Ambulatory Visit (HOSPITAL_COMMUNITY): Payer: Medicaid Other | Admitting: Physician Assistant

## 2019-03-18 ENCOUNTER — Encounter (HOSPITAL_COMMUNITY)
Admission: RE | Disposition: A | Discharge status: Home / Self Care | Payer: Self-pay | Source: Home / Self Care | Attending: General Surgery

## 2019-03-18 ENCOUNTER — Encounter (HOSPITAL_COMMUNITY): Payer: Self-pay | Admitting: General Surgery

## 2019-03-18 ENCOUNTER — Ambulatory Visit (HOSPITAL_COMMUNITY): Payer: Medicaid Other

## 2019-03-18 ENCOUNTER — Ambulatory Visit (HOSPITAL_COMMUNITY)
Admission: RE | Admit: 2019-03-18 | Discharge: 2019-03-18 | Disposition: A | Discharge status: Home / Self Care | Payer: Medicaid Other | Attending: General Surgery | Admitting: General Surgery

## 2019-03-18 DIAGNOSIS — C50912 Malignant neoplasm of unspecified site of left female breast: Secondary | ICD-10-CM | POA: Diagnosis not present

## 2019-03-18 DIAGNOSIS — C773 Secondary and unspecified malignant neoplasm of axilla and upper limb lymph nodes: Secondary | ICD-10-CM | POA: Diagnosis not present

## 2019-03-18 DIAGNOSIS — O9A112 Malignant neoplasm complicating pregnancy, second trimester: Secondary | ICD-10-CM | POA: Insufficient documentation

## 2019-03-18 DIAGNOSIS — F17210 Nicotine dependence, cigarettes, uncomplicated: Secondary | ICD-10-CM | POA: Diagnosis not present

## 2019-03-18 DIAGNOSIS — O99332 Smoking (tobacco) complicating pregnancy, second trimester: Secondary | ICD-10-CM | POA: Insufficient documentation

## 2019-03-18 DIAGNOSIS — Z419 Encounter for procedure for purposes other than remedying health state, unspecified: Secondary | ICD-10-CM

## 2019-03-18 DIAGNOSIS — Z95828 Presence of other vascular implants and grafts: Secondary | ICD-10-CM

## 2019-03-18 HISTORY — PX: PORTACATH PLACEMENT: SHX2246

## 2019-03-18 HISTORY — DX: Malignant (primary) neoplasm, unspecified: C80.1

## 2019-03-18 HISTORY — PX: BREAST BIOPSY: SHX20

## 2019-03-18 IMAGING — DX DG CHEST 1V PORT
1 series · 1 of 1 positions shown · non-contrast
Comparison: None.

CLINICAL DATA: Port catheter placement

EXAM:
PORTABLE CHEST 1 VIEW

[chest ap]
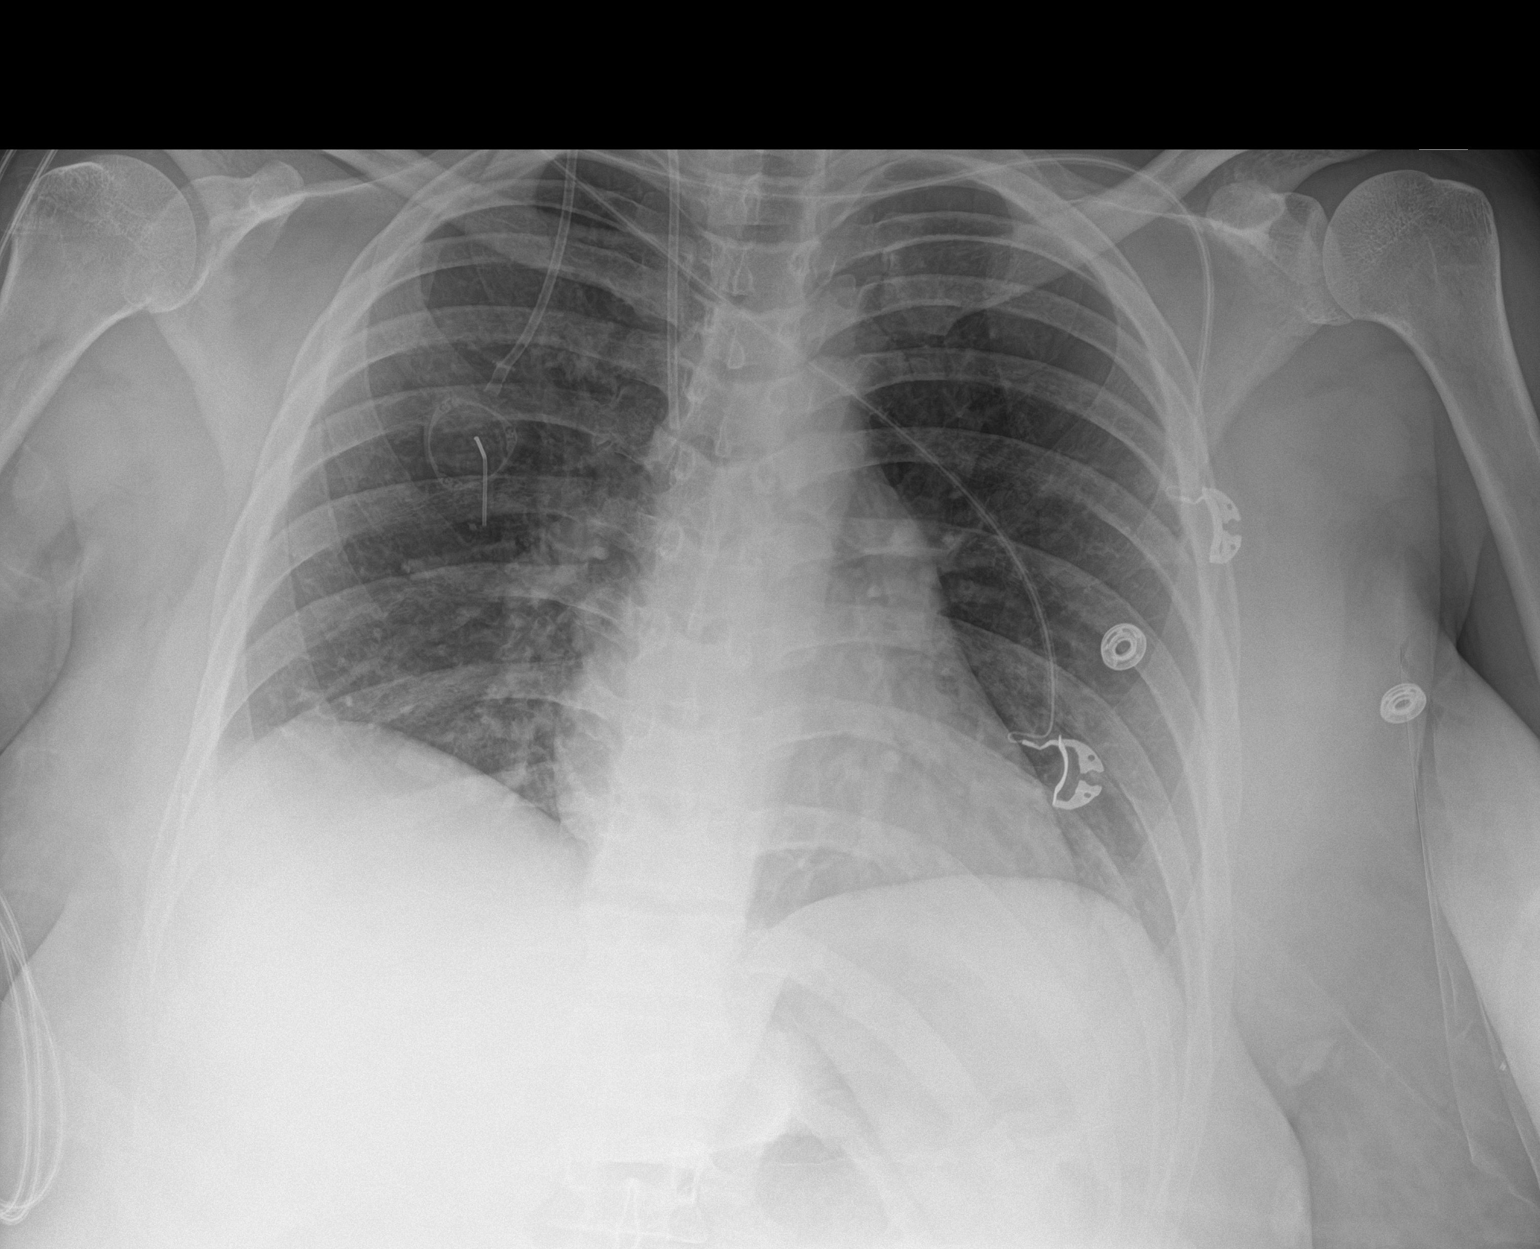

[1 of 1 positions shown; findings below may reference images not displayed]

FINDINGS: The heart size and mediastinal contours are within normal limits.
Right chest port catheter. Satisfactory placement of the hub and
tubing along a right internal jugular course, tip projecting over
the upper SVC. Both lungs are clear. The visualized skeletal
structures are unremarkable.
IMPRESSION: 1. Right chest port catheter. Satisfactory placement of the hub and
tubing along a right internal jugular course, tip projecting over
the upper SVC.
2. No acute abnormality of the lungs in AP portable projection.

## 2019-03-18 SURGERY — INSERTION, TUNNELED CENTRAL VENOUS DEVICE, WITH PORT
Anesthesia: General | Site: Breast

## 2019-03-18 MED ORDER — FENTANYL CITRATE (PF) 100 MCG/2ML IJ SOLN
INTRAMUSCULAR | Status: AC
  Filled 2019-03-18: qty 2

## 2019-03-18 MED ORDER — TRAMADOL HCL 50 MG PO TABS: 50.0000 mg | ORAL_TABLET | Freq: Four times a day (QID) | ORAL | 1 refills | Status: DC | PRN

## 2019-03-18 MED ORDER — EPHEDRINE SULFATE-NACL 50-0.9 MG/10ML-% IV SOSY
PREFILLED_SYRINGE | INTRAVENOUS | Status: DC | PRN
  Administered 2019-03-18: 5 mg via INTRAVENOUS
  Administered 2019-03-18: 10 mg via INTRAVENOUS

## 2019-03-18 MED ORDER — BUPIVACAINE HCL 0.25 % IJ SOLN
INTRAMUSCULAR | Status: DC | PRN
  Administered 2019-03-18: 7 mL

## 2019-03-18 MED ORDER — FENTANYL CITRATE (PF) 100 MCG/2ML IJ SOLN
25.0000 ug | INTRAMUSCULAR | Status: DC | PRN
  Administered 2019-03-18: 16:00:00 50 ug via INTRAVENOUS

## 2019-03-18 MED ORDER — FENTANYL CITRATE (PF) 100 MCG/2ML IJ SOLN
INTRAMUSCULAR | Status: DC | PRN
  Administered 2019-03-18 (×4): 50 ug via INTRAVENOUS

## 2019-03-18 MED ORDER — LIDOCAINE 2% (20 MG/ML) 5 ML SYRINGE
INTRAMUSCULAR | Status: DC | PRN
  Administered 2019-03-18: 100 mg via INTRAVENOUS

## 2019-03-18 MED ORDER — DEXAMETHASONE SODIUM PHOSPHATE 10 MG/ML IJ SOLN
INTRAMUSCULAR | Status: DC | PRN
  Administered 2019-03-18: 5 mg via INTRAVENOUS

## 2019-03-18 MED ORDER — HEPARIN SOD (PORK) LOCK FLUSH 100 UNIT/ML IV SOLN
INTRAVENOUS | Status: AC
  Filled 2019-03-18: qty 5

## 2019-03-18 MED ORDER — BACITRACIN ZINC 500 UNIT/GM EX OINT
TOPICAL_OINTMENT | CUTANEOUS | Status: AC
  Filled 2019-03-18: qty 28.35

## 2019-03-18 MED ORDER — CEFAZOLIN SODIUM-DEXTROSE 2-4 GM/100ML-% IV SOLN
INTRAVENOUS | Status: AC
  Filled 2019-03-18: qty 100

## 2019-03-18 MED ORDER — PROPOFOL 10 MG/ML IV BOLUS
INTRAVENOUS | Status: DC | PRN
  Administered 2019-03-18: 180 mg via INTRAVENOUS
  Administered 2019-03-18: 20 mg via INTRAVENOUS

## 2019-03-18 MED ORDER — PROMETHAZINE HCL 25 MG/ML IJ SOLN: 6.2500 mg | INTRAMUSCULAR | Status: DC | PRN

## 2019-03-18 MED ORDER — PHENYLEPHRINE 40 MCG/ML (10ML) SYRINGE FOR IV PUSH (FOR BLOOD PRESSURE SUPPORT)
PREFILLED_SYRINGE | INTRAVENOUS | Status: DC | PRN
  Administered 2019-03-18: 160 ug via INTRAVENOUS
  Administered 2019-03-18: 120 ug via INTRAVENOUS
  Administered 2019-03-18: 160 ug via INTRAVENOUS

## 2019-03-18 MED ORDER — BUPIVACAINE HCL (PF) 0.25 % IJ SOLN
INTRAMUSCULAR | Status: AC
  Filled 2019-03-18: qty 30

## 2019-03-18 MED ORDER — LIDOCAINE 2% (20 MG/ML) 5 ML SYRINGE
INTRAMUSCULAR | Status: AC
  Filled 2019-03-18: qty 5

## 2019-03-18 MED ORDER — CEFAZOLIN SODIUM-DEXTROSE 2-4 GM/100ML-% IV SOLN
2.0000 g | INTRAVENOUS | Status: AC
  Administered 2019-03-18: 2 g via INTRAVENOUS

## 2019-03-18 MED ORDER — ONDANSETRON HCL 4 MG/2ML IJ SOLN
INTRAMUSCULAR | Status: AC
  Filled 2019-03-18: qty 2

## 2019-03-18 MED ORDER — HEPARIN SOD (PORK) LOCK FLUSH 100 UNIT/ML IV SOLN
INTRAVENOUS | Status: DC | PRN
  Administered 2019-03-18: 200 [IU] via INTRAVENOUS

## 2019-03-18 MED ORDER — PROPOFOL 10 MG/ML IV BOLUS
INTRAVENOUS | Status: AC
  Filled 2019-03-18: qty 20

## 2019-03-18 MED ORDER — SUCCINYLCHOLINE CHLORIDE 200 MG/10ML IV SOSY
PREFILLED_SYRINGE | INTRAVENOUS | Status: DC | PRN
  Administered 2019-03-18: 120 mg via INTRAVENOUS

## 2019-03-18 MED ORDER — SODIUM CHLORIDE 0.9 % IV SOLN
INTRAVENOUS | Status: AC
  Filled 2019-03-18: qty 1.2

## 2019-03-18 MED ORDER — ONDANSETRON HCL 4 MG/2ML IJ SOLN
INTRAMUSCULAR | Status: DC | PRN
  Administered 2019-03-18: 4 mg via INTRAVENOUS

## 2019-03-18 MED ORDER — BUPIVACAINE-EPINEPHRINE (PF) 0.25% -1:200000 IJ SOLN
INTRAMUSCULAR | Status: AC
  Filled 2019-03-18: qty 10

## 2019-03-18 MED ORDER — SUGAMMADEX SODIUM 200 MG/2ML IV SOLN: INTRAVENOUS | Status: DC | PRN

## 2019-03-18 MED ORDER — PHENYLEPHRINE HCL-NACL 10-0.9 MG/250ML-% IV SOLN
INTRAVENOUS | Status: DC | PRN
  Administered 2019-03-18: 25 ug/min via INTRAVENOUS

## 2019-03-18 MED ORDER — SODIUM CHLORIDE 0.9 % IV SOLN
INTRAVENOUS | Status: DC | PRN
  Administered 2019-03-18: 500 mL

## 2019-03-18 MED ORDER — FENTANYL CITRATE (PF) 250 MCG/5ML IJ SOLN
INTRAMUSCULAR | Status: AC
  Filled 2019-03-18: qty 5

## 2019-03-18 MED ORDER — LACTATED RINGERS IV SOLN: INTRAVENOUS | Status: DC

## 2019-03-18 MED ORDER — DEXAMETHASONE SODIUM PHOSPHATE 10 MG/ML IJ SOLN
INTRAMUSCULAR | Status: AC
  Filled 2019-03-18: qty 1

## 2019-03-18 SURGICAL SUPPLY — 65 items
APPLIER CLIP 9.375 MED OPEN (MISCELLANEOUS)
BAG DECANTER FOR FLEXI CONT (MISCELLANEOUS) ×3 IMPLANT
BAND RUBBER #18 3X1/16 STRL (MISCELLANEOUS) ×1 IMPLANT
BINDER BREAST LRG (GAUZE/BANDAGES/DRESSINGS) IMPLANT
BINDER BREAST XLRG (GAUZE/BANDAGES/DRESSINGS) IMPLANT
BINDER BREAST XXLRG (GAUZE/BANDAGES/DRESSINGS) ×1 IMPLANT
BLADE 11 SAFETY STRL DISP (BLADE) ×1 IMPLANT
BLADE CLIPPER SURG (BLADE) IMPLANT
CANISTER SUCT 3000ML PPV (MISCELLANEOUS) IMPLANT
CHLORAPREP W/TINT 10.5 ML (MISCELLANEOUS) ×3 IMPLANT
CHLORAPREP W/TINT 26 (MISCELLANEOUS) ×3 IMPLANT
CLIP APPLIE 9.375 MED OPEN (MISCELLANEOUS) IMPLANT
CONT SPEC 4OZ CLIKSEAL STRL BL (MISCELLANEOUS) IMPLANT
COVER SURGICAL LIGHT HANDLE (MISCELLANEOUS) ×3 IMPLANT
COVER TRANSDUCER ULTRASND GEL (DRAPE) ×3 IMPLANT
COVER WAND RF STERILE (DRAPES) ×3 IMPLANT
DECANTER SPIKE VIAL GLASS SM (MISCELLANEOUS) ×3 IMPLANT
DERMABOND ADVANCED (GAUZE/BANDAGES/DRESSINGS) ×1
DERMABOND ADVANCED .7 DNX12 (GAUZE/BANDAGES/DRESSINGS) ×2 IMPLANT
DRAPE C-ARM 42X120 X-RAY (DRAPES) ×3 IMPLANT
DRAPE CHEST BREAST 15X10 FENES (DRAPES) ×3 IMPLANT
DRSG TEGADERM 4X4.75 (GAUZE/BANDAGES/DRESSINGS) ×2 IMPLANT
ELECT CAUTERY BLADE 6.4 (BLADE) ×3 IMPLANT
ELECT REM PT RETURN 9FT ADLT (ELECTROSURGICAL) ×3
ELECTRODE REM PT RTRN 9FT ADLT (ELECTROSURGICAL) ×2 IMPLANT
GAUZE 4X4 16PLY RFD (DISPOSABLE) ×3 IMPLANT
GAUZE SPONGE 4X4 12PLY STRL LF (GAUZE/BANDAGES/DRESSINGS) ×1 IMPLANT
GEL ULTRASOUND 20GR AQUASONIC (MISCELLANEOUS) ×3 IMPLANT
GLOVE BIO SURGEON STRL SZ7 (GLOVE) ×3 IMPLANT
GLOVE BIOGEL PI IND STRL 7.5 (GLOVE) ×2 IMPLANT
GLOVE BIOGEL PI INDICATOR 7.5 (GLOVE) ×1
GOWN STRL REUS W/ TWL LRG LVL3 (GOWN DISPOSABLE) ×4 IMPLANT
GOWN STRL REUS W/TWL LRG LVL3 (GOWN DISPOSABLE) ×2
INTRODUCER COOK 11FR (CATHETERS) IMPLANT
KIT BASIN OR (CUSTOM PROCEDURE TRAY) ×3 IMPLANT
KIT PORT POWER 8FR ISP CVUE (Port) ×3 IMPLANT
KIT TURNOVER KIT B (KITS) ×3 IMPLANT
NDL HYPO 25GX1X1/2 BEV (NEEDLE) ×2 IMPLANT
NEEDLE HYPO 25GX1X1/2 BEV (NEEDLE) ×3 IMPLANT
NS IRRIG 1000ML POUR BTL (IV SOLUTION) ×3 IMPLANT
PACK GENERAL/GYN (CUSTOM PROCEDURE TRAY) ×3 IMPLANT
PAD ARMBOARD 7.5X6 YLW CONV (MISCELLANEOUS) ×6 IMPLANT
PENCIL BUTTON HOLSTER BLD 10FT (ELECTRODE) ×3 IMPLANT
PENCIL SMOKE EVACUATOR (MISCELLANEOUS) ×3 IMPLANT
POSITIONER HEAD DONUT 9IN (MISCELLANEOUS) ×3 IMPLANT
SET INTRODUCER 12FR PACEMAKER (INTRODUCER) IMPLANT
SET SHEATH INTRODUCER 10FR (MISCELLANEOUS) IMPLANT
SHEATH COOK PEEL AWAY SET 9F (SHEATH) IMPLANT
SPONGE LAP 4X18 RFD (DISPOSABLE) ×3 IMPLANT
STRIP CLOSURE SKIN 1/2X4 (GAUZE/BANDAGES/DRESSINGS) ×3 IMPLANT
SUT ETHILON 3 0 PS 1 (SUTURE) ×1 IMPLANT
SUT MNCRL AB 4-0 PS2 18 (SUTURE) ×3 IMPLANT
SUT PROLENE 2 0 SH DA (SUTURE) ×3 IMPLANT
SUT SILK 2 0 (SUTURE)
SUT SILK 2 0 SH (SUTURE) IMPLANT
SUT SILK 2-0 18XBRD TIE 12 (SUTURE) IMPLANT
SUT VIC AB 2-0 SH 27 (SUTURE) ×1
SUT VIC AB 2-0 SH 27X BRD (SUTURE) ×2 IMPLANT
SUT VIC AB 3-0 SH 27 (SUTURE) ×1
SUT VIC AB 3-0 SH 27XBRD (SUTURE) ×2 IMPLANT
SYR 5ML LUER SLIP (SYRINGE) ×3 IMPLANT
SYR CONTROL 10ML LL (SYRINGE) ×3 IMPLANT
TOWEL GREEN STERILE (TOWEL DISPOSABLE) ×3 IMPLANT
TOWEL GREEN STERILE FF (TOWEL DISPOSABLE) ×3 IMPLANT
TRAY LAPAROSCOPIC MC (CUSTOM PROCEDURE TRAY) ×3 IMPLANT

## 2019-03-18 NOTE — Anesthesia Postprocedure Evaluation (Signed)
Anesthesia Post Note  Patient: Melissa [name]  Procedure(s) Performed: INSERTION PORT-A-CATH WITH ULTRASOUND GUIDANCE (N/A Breast) LEFT BREAST SKIN PUNCH BIOPSY (Left Breast)     Patient location during evaluation: PACU Anesthesia Type: General Level of consciousness: awake and alert Pain management: pain level controlled Vital Signs Assessment: post-procedure vital signs reviewed and stable Respiratory status: spontaneous breathing, nonlabored ventilation, respiratory function stable and patient connected to nasal cannula oxygen Cardiovascular status: blood pressure returned to baseline and stable Postop Assessment: no apparent nausea or vomiting Anesthetic complications: no    Last Vitals:  Vitals:   03/18/19 1644 03/18/19 1645  BP: 118/70   Pulse: 98 89  Resp: (!) 24 17  Temp:    SpO2: 99% 97%    Last Pain:  Vitals:   03/18/19 1617  TempSrc:   PainSc: 9                  [name]

## 2019-03-18 NOTE — Transfer of Care (Signed)
Immediate Anesthesia Transfer of Care Note  Patient: Nation Alsman  Procedure(s) Performed: INSERTION PORT-A-CATH WITH ULTRASOUND GUIDANCE (N/A Breast) LEFT BREAST SKIN PUNCH BIOPSY (Left Breast)  Patient Location: PACU  Anesthesia Type:General  Level of Consciousness: drowsy  Airway & Oxygen Therapy: Patient Spontanous Breathing and Patient connected to face mask oxygen  Post-op Assessment: Report given to RN and Post -op Vital signs reviewed and stable  Post vital signs: Reviewed and stable  Last Vitals:  Vitals Value Taken Time  BP 130/68 03/18/19 1544  Temp    Pulse 106 03/18/19 1546  Resp 21 03/18/19 1546  SpO2 100 % 03/18/19 1546  Vitals shown include unvalidated device data.  Last Pain:  Vitals:   03/18/19 1157  TempSrc:   PainSc: 0-No pain      Patients Stated Pain Goal: 3 ([phone] [phone])  Complications: No apparent anesthesia complications

## 2019-03-18 NOTE — Progress Notes (Signed)
Contacted OB rapid response to come check fetal heart tones. Pt's OBGYN is with [facility], per RR RN will need to contact OB on call here to secure orders for heart tones.

## 2019-03-18 NOTE — Op Note (Signed)
Preoperative diagnosis:left inflammatory breast cancer Postoperative diagnosis: saa Procedure: Right ij port placement with US guidance, left breast skin punch biopsy Surgeon: Dr Serita Grammes EBL: minimal Anesthesia: general  Complications none Drains none Specimens:none Sponge and needle count correct times two dispo to recovery stable  Indications:  66 yof referred by Dr Lindi Adie who is 5 months pregnant. she has noted for about 4 months of left breast swelling and hardness. she was treated with abx and then eventually was sent for mm and [place]. her density is C. on mm there is diffuse skin thickening and suspicious calcifications spanning 8x5 cm. US shows at least 3 abnormal nodes present with possible fourth. biopsy of the breast is idc grade 2 that is her 2 negative, er pos, pr pos and Ki 50%. she has been seen by oncology with plans for chemotherapy to start next week. staging scans to follow delivery. her pregnancy has been going well.   Procedure:After informed consent was obtained the patient was taken to the operating room. She was given antibiotics. SCDs were placed. She was placed under general anesthesia without complication. She was prepped and draped in the standard sterile surgical fashion. A surgical timeout was then performed. She was shielded the entire operation.  I first did a 6 mm punch biopsy on the left breast and closed this with a 3-0 nylon suture.   I then identified the internal jugular vein on the right side with the ultrasound. I made a small nick in the skin. I accessed the internal jugular vein with the needle under ultrasound guidance. I passed the wire. The wire was confirmed to be in position with fluoroscopy.The wire was in the vein by ultrasound as well.I then infiltrated Marcaine below the clavicle on the right side. I made an incision and developed a subcutaneous pocket for the port. I then tunneled between the port site as well as the  insertion site. I brought the line through this. I then placed the dilator under fluoroscopic guidance over the wire. I removedthe wire andthe dilator. I then placed the line into the sheath. The sheath was then removed. I pulled the line back to be in the distal vena cava. I then hooked this up to the port. This was placed in the pocket and sutured in place with 2-0 Prolene suture. I then closed this with 3-0 Vicryl and 4-0 Monocryl. Glue was placed. I accessed this. It withdrew blood and flushed easily. I packed it with heparin.I left it accessed for chemotherapy tomorrow. I used 8 seconds of fluoroscopy.

## 2019-03-18 NOTE — Interval H&P Note (Signed)
History and Physical Interval Note:  [id number] PM  Melissa [name]  has presented today for surgery, with the diagnosis of BREAST CANCER.  The various methods of treatment have been discussed with the patient and family. After consideration of risks, benefits and other options for treatment, the patient has consented to  Procedure(s): INSERTION PORT-A-CATH WITH ULTRASOUND GUIDANCE (N/A) LEFT BREAST SKIN PUNCH BIOPSY (Left) as a surgical intervention.  The patient's history has been reviewed, patient examined, no change in status, stable for surgery.  I have reviewed the patient's chart and labs.  Questions were answered to the patient's satisfaction.     Rolm Bookbinder

## 2019-03-18 NOTE — Anesthesia Procedure Notes (Signed)
Procedure Name: Intubation Date/Time: 03/18/2019 2:42 PM Performed by: Bryson Corona, CRNA Pre-anesthesia Checklist: Patient identified, Emergency Drugs available, Suction available and Patient being monitored Patient Re-evaluated:Patient Re-evaluated prior to induction Oxygen Delivery Method: Circle System Utilized Preoxygenation: Pre-oxygenation with 100% oxygen Induction Type: IV induction, Rapid sequence and Cricoid Pressure applied Laryngoscope Size: Mac and 3 Grade View: Grade I Tube type: Oral Tube size: 7.0 mm Number of attempts: 1 Airway Equipment and Method: Stylet and Oral airway Placement Confirmation: ETT inserted through vocal cords under direct vision,  positive ETCO2 and breath sounds checked- equal and bilateral Secured at: 22 cm Tube secured with: Tape Dental Injury: Teeth and Oropharynx as per pre-operative assessment

## 2019-03-18 NOTE — Discharge Instructions (Signed)
    PORT-A-CATH: POST OP INSTRUCTIONS  Always review your discharge instruction sheet given to you by the facility where your surgery was performed.   1. A prescription for pain medication may be given to you upon discharge. Take your pain medication as prescribed, if needed. If narcotic pain medicine is not needed, then you make take acetaminophen (Tylenol) or ibuprofen (Advil) as needed.  2. Take your usually prescribed medications unless otherwise directed. 3. If you need a refill on your pain medication, please contact our office. All narcotic pain medicine now requires a paper prescription.  Phoned in and fax refills are no longer allowed by law.  Prescriptions will not be filled after 5 pm or on weekends.  4. You should follow a light diet for the remainder of the day after your procedure. 5. Most patients will experience some mild swelling and/or bruising in the area of the incision. It may take several days to resolve. 6. It is common to experience some constipation if taking pain medication after surgery. Increasing fluid intake and taking a stool softener (such as Colace) will usually help or prevent this problem from occurring. A mild laxative (Milk of Magnesia or Miralax) should be taken according to package directions if there are no bowel movements after 48 hours.  7. Unless discharge instructions indicate otherwise, you may remove your bandages 48 hours after surgery, and you may shower at that time. You may have steri-strips (small white skin tapes) in place directly over the incision.  These strips should be left on the skin for 7-10 days.  If your surgeon used Dermabond (skin glue) on the incision, you may shower in 24 hours.  The glue will flake off over the next 2-3 weeks.  8. If your port is left accessed at the end of surgery (needle left in port), the dressing cannot get wet and should only by changed by a healthcare professional. When the port is no longer accessed (when the  needle has been removed), follow step 7.   9. ACTIVITIES:  Limit activity involving your arms for the next 72 hours. Do no strenuous exercise or activity for 1 week. You may drive when you are no longer taking prescription pain medication, you can comfortably wear a seatbelt, and you can maneuver your car. 10.You may need to see your doctor in the office for a follow-up appointment.  Please       check with your doctor.  11.When you receive a new Port-a-Cath, you will get a product guide and        ID card.  Please keep them in case you need them.  WHEN TO CALL YOUR DOCTOR ([phone]): 1. Fever over 101.0 2. Chills 3. Continued bleeding from incision 4. Increased redness and tenderness at the site 5. Shortness of breath, difficulty breathing   The clinic staff is available to answer your questions during regular business hours. Please don't hesitate to call and ask to speak to one of the nurses or medical assistants for clinical concerns. If you have a medical emergency, go to the nearest emergency room or call 911.  A surgeon from [facility] is always on call at the hospital.     For further information, please visit [email or web address]      

## 2019-03-18 NOTE — H&P (Signed)
29 yof referred by Dr Lindi Adie who is 5 months pregnant. she has noted for about 4 months of left breast swelling and hardness. she was treated with abx and then eventually was sent for mm and [place]. her density is C. on mm there is diffuse skin thickening and suspicious calcifications spanning 8x5 cm. US shows at least 3 abnormal nodes present with possible fourth. biopsy of the breast is idc grade 2 that is her 2 negative, er pos, pr pos and Ki 50%. she has been seen by oncology with plans for chemotherapy to start next week. staging scans to follow delivery. her pregnancy has been going well. 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 she has a 31 year old at home also.   Past Surgical History Melissa [name] [place], RMA; 03/12/2019 9:14 AM) No pertinent past surgical history   Diagnostic Studies History Melissa [name] [place], RMA; 03/12/2019 9:14 AM) Colonoscopy  never Mammogram  within last year Pap Smear  1-5 years ago  Allergies Marguarite [place], RMA; 03/12/2019 9:15 AM) No Known Drug Allergies [03/12/2019]: Allergies Reconciled   Medication History [employer], RMA; 03/12/2019 9:15 AM) Tylenol (325MG  Tablet, Oral) Active. Pre-Natal (Oral) Active. Desogestrel-Ethinyl Estradiol (0.15-0.02/0.01MG  (21/5) Tablet, Oral) Active. Medications Reconciled  Social History Melissa [name] [place], RMA; 03/12/2019 9:14 AM) Alcohol use  Occasional alcohol use. Caffeine use  Coffee. Illicit drug use  Uses weekly. Tobacco use  Former smoker.  Family History Melissa [name] [place], [place]; 03/12/2019 9:14 AM) Family history unknown  First Degree Relatives   Pregnancy / Birth History Melissa [name] [place], RMA; 03/12/2019 9:14 AM) Age at menarche  29 years. Contraceptive History  Oral contraceptives. Gravida  2 Irregular periods  Maternal age  31-31 Para  1  Other Problems Melissa [name] [place], RMA; 03/12/2019 9:14 AM) Breast Cancer      Review of Systems Melissa [name] [place] RMA; 03/12/2019 9:14 AM) General Not Present- Appetite Loss, Chills, Fatigue, Fever, Night Sweats, Weight Gain and Weight Loss. Skin Not Present- Change in Wart/Mole, Dryness, Hives, Jaundice, New Lesions, Non-Healing Wounds, Rash and Ulcer. HEENT Not Present- Earache, Hearing Loss, Hoarseness, Nose Bleed, Oral Ulcers, Ringing in the Ears, Seasonal Allergies, Sinus Pain, Sore Throat, Visual Disturbances, Wears glasses/contact lenses and Yellow Eyes. Respiratory Not Present- Bloody sputum, Chronic Cough, Difficulty Breathing, Snoring and Wheezing. Breast Present- Breast Pain. Not Present- Breast Mass, Nipple Discharge and Skin Changes. Cardiovascular Not Present- Chest Pain, Difficulty Breathing Lying Down, Leg Cramps, Palpitations, Rapid Heart Rate, Shortness of Breath and Swelling of Extremities. Gastrointestinal Not Present- Abdominal Pain, Bloating, Bloody Stool, Change in Bowel Habits, Chronic diarrhea, Constipation, Difficulty Swallowing, Excessive gas, Gets full quickly at meals, Hemorrhoids, Indigestion, Nausea, Rectal Pain and Vomiting. Female Genitourinary Not Present- Frequency, Nocturia, Painful Urination, Pelvic Pain and Urgency. Musculoskeletal Not Present- Back Pain, Joint Pain, Joint Stiffness, Muscle Pain, Muscle Weakness and Swelling of Extremities. Neurological Not Present- Decreased Memory, Fainting, Headaches, Numbness, Seizures, Tingling, Tremor, Trouble walking and Weakness. Psychiatric Not Present- Anxiety, Bipolar, Change in Sleep Pattern, Depression, Fearful and Frequent crying. Endocrine Not Present- Cold Intolerance, Excessive Hunger, Hair Changes, Heat Intolerance, Hot flashes and New Diabetes. Hematology Not Present- Blood Thinners, Easy Bruising, Excessive bleeding, Gland problems, HIV and Persistent Infections.  Vitals Melissa [name] [place] RMA; 03/12/2019 9:16 AM) 03/12/2019 9:15 AM Weight: 196 lb Height: 62in Body  Surface Area: 1.9 m Body Mass Index: 35.85 kg/m  Temp.: 8F(Temporal)  Pulse: 93 (Regular)  P.OX: 100% (Room air) BP: 120/70 (Sitting, Left Arm, Standard) Physical Exam Rolm Bookbinder  MD; 03/12/2019 5:05 PM) General Mental Status-Alert. Orientation-Oriented X3. Eye Sclera/Conjunctiva - Bilateral-No scleral icterus. Chest and Lung Exam Chest and lung exam reveals -quiet, even and easy respiratory effort with no use of accessory muscles. Breast Nipples-No Discharge. Note: no right breast mass left breast enlarged, thickened skin with peau d'orange appearance Neurologic Neurologic evaluation reveals -alert and oriented x 3 with no impairment of recent or remote memory. Lymphatic Head & Neck General Head & Neck Lymphatics: Bilateral - Description - Normal. Axillary General Axillary Region: Bilateral - Description - Normal. Note: no [place] adenopathy   Assessment & Plan Rolm Bookbinder MD; 03/12/2019 5:06 PM) BREAST CANCER METASTASIZED TO AXILLARY LYMPH NODE, RIGHT (C50.911) Story: clinical inflammatory left breast cancer I think she will need MRM eventually once she completes chemotherapy and has delivered. discussed port placement next week with shielding for chemo on the 7th. genetics pending

## 2019-03-19 ENCOUNTER — Inpatient Hospital Stay: Payer: Medicaid Other

## 2019-03-19 ENCOUNTER — Encounter: Payer: Self-pay | Admitting: *Deleted

## 2019-03-19 ENCOUNTER — Telehealth: Payer: Self-pay | Admitting: Adult Health

## 2019-03-19 ENCOUNTER — Encounter: Payer: Self-pay | Admitting: General Practice

## 2019-03-19 ENCOUNTER — Inpatient Hospital Stay (HOSPITAL_BASED_OUTPATIENT_CLINIC_OR_DEPARTMENT_OTHER): Payer: Medicaid Other | Admitting: Adult Health

## 2019-03-19 ENCOUNTER — Inpatient Hospital Stay (HOSPITAL_BASED_OUTPATIENT_CLINIC_OR_DEPARTMENT_OTHER): Payer: Medicaid Other | Admitting: Medical

## 2019-03-19 ENCOUNTER — Inpatient Hospital Stay: Payer: Medicaid Other | Admitting: General Practice

## 2019-03-19 ENCOUNTER — Other Ambulatory Visit: Payer: Self-pay

## 2019-03-19 ENCOUNTER — Encounter: Payer: Self-pay | Admitting: Adult Health

## 2019-03-19 VITALS — BP 123/59 | HR 82 | Resp 18

## 2019-03-19 DIAGNOSIS — C50812 Malignant neoplasm of overlapping sites of left female breast: Secondary | ICD-10-CM

## 2019-03-19 DIAGNOSIS — L299 Pruritus, unspecified: Secondary | ICD-10-CM

## 2019-03-19 DIAGNOSIS — Z5111 Encounter for antineoplastic chemotherapy: Secondary | ICD-10-CM | POA: Diagnosis not present

## 2019-03-19 DIAGNOSIS — Z170 Estrogen receptor positive status [ER+]: Secondary | ICD-10-CM

## 2019-03-19 LAB — SURGICAL PATHOLOGY

## 2019-03-19 MED ORDER — SODIUM CHLORIDE 0.9% FLUSH
10.0000 mL | INTRAVENOUS | Status: DC | PRN
  Administered 2019-03-19: 10 mL
  Filled 2019-03-19: qty 10

## 2019-03-19 MED ORDER — SODIUM CHLORIDE 0.9 % IV SOLN: 10.0000 mg | Freq: Once | INTRAVENOUS | Status: DC

## 2019-03-19 MED ORDER — PALONOSETRON HCL INJECTION 0.25 MG/5ML
INTRAVENOUS | Status: AC
  Filled 2019-03-19: qty 5

## 2019-03-19 MED ORDER — PALONOSETRON HCL INJECTION 0.25 MG/5ML
0.2500 mg | Freq: Once | INTRAVENOUS | Status: AC
  Administered 2019-03-19: 0.25 mg via INTRAVENOUS

## 2019-03-19 MED ORDER — LORAZEPAM 2 MG/ML IJ SOLN
INTRAMUSCULAR | Status: AC
  Filled 2019-03-19: qty 1

## 2019-03-19 MED ORDER — DEXAMETHASONE SODIUM PHOSPHATE 10 MG/ML IJ SOLN
10.0000 mg | Freq: Once | INTRAMUSCULAR | Status: AC
  Administered 2019-03-19: 10 mg via INTRAVENOUS

## 2019-03-19 MED ORDER — HEPARIN SOD (PORK) LOCK FLUSH 100 UNIT/ML IV SOLN
500.0000 [IU] | Freq: Once | INTRAVENOUS | Status: AC | PRN
  Administered 2019-03-19: 500 [IU]
  Filled 2019-03-19: qty 5

## 2019-03-19 MED ORDER — SODIUM CHLORIDE 0.9 % IV SOLN
150.0000 mg | Freq: Once | INTRAVENOUS | Status: DC
  Filled 2019-03-19: qty 5

## 2019-03-19 MED ORDER — DOXORUBICIN HCL CHEMO IV INJECTION 2 MG/ML
60.0000 mg/m2 | Freq: Once | INTRAVENOUS | Status: AC
  Administered 2019-03-19: 120 mg via INTRAVENOUS
  Filled 2019-03-19: qty 60

## 2019-03-19 MED ORDER — DEXAMETHASONE SODIUM PHOSPHATE 10 MG/ML IJ SOLN
INTRAMUSCULAR | Status: AC
  Filled 2019-03-19: qty 1

## 2019-03-19 MED ORDER — SODIUM CHLORIDE 0.9 % IV SOLN
600.0000 mg/m2 | Freq: Once | INTRAVENOUS | Status: AC
  Administered 2019-03-19: 1200 mg via INTRAVENOUS
  Filled 2019-03-19: qty 60

## 2019-03-19 MED ORDER — SODIUM CHLORIDE 0.9 % IV SOLN
Freq: Once | INTRAVENOUS | Status: AC
  Filled 2019-03-19: qty 250

## 2019-03-19 MED ORDER — FAMOTIDINE IN NACL 20-0.9 MG/50ML-% IV SOLN
20.0000 mg | Freq: Once | INTRAVENOUS | Status: AC
  Administered 2019-03-19: 20 mg via INTRAVENOUS

## 2019-03-19 MED ORDER — LORAZEPAM 2 MG/ML IJ SOLN
0.5000 mg | Freq: Once | INTRAMUSCULAR | Status: AC
  Administered 2019-03-19: 0.5 mg via INTRAVENOUS

## 2019-03-19 NOTE — Patient Instructions (Signed)
[facility] Discharge Instructions for Patients Receiving Chemotherapy  Today you received the following chemotherapy agents Adriamycin and Cytoxan  To help prevent nausea and vomiting after your treatment, we encourage you to take your nausea medication as prescribed.   If you develop nausea and vomiting that is not controlled by your nausea medication, call the clinic.   BELOW ARE SYMPTOMS THAT SHOULD BE REPORTED IMMEDIATELY:  *FEVER GREATER THAN 100.5 F  *CHILLS WITH OR WITHOUT FEVER  NAUSEA AND VOMITING THAT IS NOT CONTROLLED WITH YOUR NAUSEA MEDICATION  *UNUSUAL SHORTNESS OF BREATH  *UNUSUAL BRUISING OR BLEEDING  TENDERNESS IN MOUTH AND THROAT WITH OR WITHOUT PRESENCE OF ULCERS  *URINARY PROBLEMS  *BOWEL PROBLEMS  UNUSUAL RASH Items with * indicate a potential emergency and should be followed up as soon as possible.  Feel free to call the clinic should you have any questions or concerns. The clinic phone number is [phone].  Please show the [facility] at check-in to the Emergency Department and triage nurse.  Doxorubicin injection(Adriamycin) What is this medicine? DOXORUBICIN (dox oh ROO bi sin) is a chemotherapy drug. It is used to treat many kinds of cancer like leukemia, lymphoma, neuroblastoma, sarcoma, and Wilms' tumor. It is also used to treat bladder cancer, breast cancer, lung cancer, ovarian cancer, stomach cancer, and thyroid cancer. This medicine may be used for other purposes; ask your health care provider or pharmacist if you have questions. COMMON BRAND NAME(S): Adriamycin, Adriamycin PFS, Adriamycin RDF, Rubex What should I tell my health care provider before I take this medicine? They need to know if you have any of these conditions:  heart disease  history of low blood counts caused by a medicine  liver disease  recent or ongoing radiation therapy  an unusual or allergic reaction to doxorubicin, other chemotherapy  agents, other medicines, foods, dyes, or preservatives  pregnant or trying to get pregnant  breast-feeding How should I use this medicine? This drug is given as an infusion into a vein. It is administered in a hospital or clinic by a specially trained health care professional. If you have pain, swelling, burning or any unusual feeling around the site of your injection, tell your health care professional right away. Talk to your pediatrician regarding the use of this medicine in children. Special care may be needed. Overdosage: If you think you have taken too much of this medicine contact a poison control center or emergency room at once. NOTE: This medicine is only for you. Do not share this medicine with others. What if I miss a dose? It is important not to miss your dose. Call your doctor or health care professional if you are unable to keep an appointment. What may interact with this medicine? This medicine may interact with the following medications:  6-mercaptopurine  paclitaxel  phenytoin  St. John's Wort  trastuzumab  verapamil This list may not describe all possible interactions. Give your health care provider a list of all the medicines, herbs, non-prescription drugs, or dietary supplements you use. Also tell them if you smoke, drink alcohol, or use illegal drugs. Some items may interact with your medicine. What should I watch for while using this medicine? This drug may make you feel generally unwell. This is not uncommon, as chemotherapy can affect healthy cells as well as cancer cells. Report any side effects. Continue your course of treatment even though you feel ill unless your doctor tells you to stop. There is a maximum amount  of this medicine you should receive throughout your life. The amount depends on the medical condition being treated and your overall health. Your doctor will watch how much of this medicine you receive in your lifetime. Tell your doctor if you have  taken this medicine before. You may need blood work done while you are taking this medicine. Your urine may turn red for a few days after your dose. This is not blood. If your urine is dark or brown, call your doctor. In some cases, you may be given additional medicines to help with side effects. Follow all directions for their use. Call your doctor or health care professional for advice if you get a fever, chills or sore throat, or other symptoms of a cold or flu. Do not treat yourself. This drug decreases your body's ability to fight infections. Try to avoid being around people who are sick. This medicine may increase your risk to bruise or bleed. Call your doctor or health care professional if you notice any unusual bleeding. Talk to your doctor about your risk of cancer. You may be more at risk for certain types of cancers if you take this medicine. Do not become pregnant while taking this medicine or for 6 months after stopping it. Women should inform their doctor if they wish to become pregnant or think they might be pregnant. Men should not father a child while taking this medicine and for 6 months after stopping it. There is a potential for serious side effects to an unborn child. Talk to your health care professional or pharmacist for more information. Do not breast-feed an infant while taking this medicine. This medicine has caused ovarian failure in some women and reduced sperm counts in some men This medicine may interfere with the ability to have a child. Talk with your doctor or health care professional if you are concerned about your fertility. This medicine may cause a decrease in Co-Enzyme Q-10. You should make sure that you get enough Co-Enzyme Q-10 while you are taking this medicine. Discuss the foods you eat and the vitamins you take with your health care professional. What side effects may I notice from receiving this medicine? Side effects that you should report to your doctor or  health care professional as soon as possible:  allergic reactions like skin rash, itching or hives, swelling of the face, lips, or tongue  breathing problems  chest pain  fast or irregular heartbeat  low blood counts - this medicine may decrease the number of white blood cells, red blood cells and platelets. You may be at increased risk for infections and bleeding.  pain, redness, or irritation at site where injected  signs of infection - fever or chills, cough, sore throat, pain or difficulty passing urine  signs of decreased platelets or bleeding - bruising, pinpoint red spots on the skin, black, tarry stools, blood in the urine  swelling of the ankles, feet, hands  tiredness  weakness Side effects that usually do not require medical attention (report to your doctor or health care professional if they continue or are bothersome):  diarrhea  hair loss  mouth sores  nail discoloration or damage  nausea  red colored urine  vomiting This list may not describe all possible side effects. Call your doctor for medical advice about side effects. You may report side effects to FDA at 1-800-FDA-1088. Where should I keep my medicine? This drug is given in a hospital or clinic and will not be stored at home.  NOTE: This sheet is a summary. It may not cover all possible information. If you have questions about this medicine, talk to your doctor, pharmacist, or health care provider.  2020 [employer]/Gold Standard (2016-10-10 11:01:26)   Cyclophosphamide Injection(Cytoxan) What is this medicine? CYCLOPHOSPHAMIDE (sye kloe FOSS fa mide) is a chemotherapy drug. It slows the growth of cancer cells. This medicine is used to treat many types of cancer like lymphoma, myeloma, leukemia, breast cancer, and ovarian cancer, to name a few. This medicine may be used for other purposes; ask your health care provider or pharmacist if you have questions. COMMON BRAND NAME(S): Cytoxan, Neosar What  should I tell my health care provider before I take this medicine? They need to know if you have any of these conditions:  heart disease  history of irregular heartbeat  infection  kidney disease  liver disease  low blood counts, like white cells, platelets, or red blood cells  on hemodialysis  recent or ongoing radiation therapy  scarring or thickening of the lungs  trouble passing urine  an unusual or allergic reaction to cyclophosphamide, other medicines, foods, dyes, or preservatives  pregnant or trying to get pregnant  breast-feeding How should I use this medicine? This drug is usually given as an injection into a vein or muscle or by infusion into a vein. It is administered in a hospital or clinic by a specially trained health care professional. Talk to your pediatrician regarding the use of this medicine in children. Special care may be needed. Overdosage: If you think you have taken too much of this medicine contact a poison control center or emergency room at once. NOTE: This medicine is only for you. Do not share this medicine with others. What if I miss a dose? It is important not to miss your dose. Call your doctor or health care professional if you are unable to keep an appointment. What may interact with this medicine?  amphotericin B  azathioprine  certain antivirals for HIV or hepatitis  certain medicines for blood pressure, heart disease, irregular heart beat  certain medicines that treat or prevent blood clots like warfarin  certain other medicines for cancer  cyclosporine  etanercept  indomethacin  medicines that relax muscles for surgery  medicines to increase blood counts  metronidazole This list may not describe all possible interactions. Give your health care provider a list of all the medicines, herbs, non-prescription drugs, or dietary supplements you use. Also tell them if you smoke, drink alcohol, or use illegal drugs. Some items  may interact with your medicine. What should I watch for while using this medicine? Your condition will be monitored carefully while you are receiving this medicine. You may need blood work done while you are taking this medicine. Drink water or other fluids as directed. Urinate often, even at night. Some products may contain alcohol. Ask your health care professional if this medicine contains alcohol. Be sure to tell all health care professionals you are taking this medicine. Certain medicines, like metronidazole and disulfiram, can cause an unpleasant reaction when taken with alcohol. The reaction includes flushing, headache, nausea, vomiting, sweating, and increased thirst. The reaction can last from 30 minutes to several hours. Do not become pregnant while taking this medicine or for 1 year after stopping it. Women should inform their health care professional if they wish to become pregnant or think they might be pregnant. Men should not father a child while taking this medicine and for 4 months after stopping it. There  is potential for serious side effects to an unborn child. Talk to your health care professional for more information. Do not breast-feed an infant while taking this medicine or for 1 week after stopping it. This medicine has caused ovarian failure in some women. This medicine may make it more difficult to get pregnant. Talk to your health care professional if you are concerned about your fertility. This medicine has caused decreased sperm counts in some men. This may make it more difficult to father a child. Talk to your health care professional if you are concerned about your fertility. Call your health care professional for advice if you get a fever, chills, or sore throat, or other symptoms of a cold or flu. Do not treat yourself. This medicine decreases your body's ability to fight infections. Try to avoid being around people who are sick. Avoid taking medicines that contain  aspirin, acetaminophen, ibuprofen, naproxen, or ketoprofen unless instructed by your health care professional. These medicines may hide a fever. Talk to your health care professional about your risk of cancer. You may be more at risk for certain types of cancer if you take this medicine. If you are going to need surgery or other procedure, tell your health care professional that you are using this medicine. Be careful brushing or flossing your teeth or using a toothpick because you may get an infection or bleed more easily. If you have any dental work done, tell your dentist you are receiving this medicine. What side effects may I notice from receiving this medicine? Side effects that you should report to your doctor or health care professional as soon as possible:  allergic reactions like skin rash, itching or hives, swelling of the face, lips, or tongue  breathing problems  nausea, vomiting  signs and symptoms of bleeding such as bloody or black, tarry stools; red or dark brown urine; spitting up blood or brown material that looks like coffee grounds; red spots on the skin; unusual bruising or bleeding from the eyes, gums, or nose  signs and symptoms of heart failure like fast, irregular heartbeat, sudden weight gain; swelling of the ankles, feet, hands  signs and symptoms of infection like fever; chills; cough; sore throat; pain or trouble passing urine  signs and symptoms of kidney injury like trouble passing urine or change in the amount of urine  signs and symptoms of liver injury like dark yellow or brown urine; general ill feeling or flu-like symptoms; light-colored stools; loss of appetite; nausea; right upper belly pain; unusually weak or tired; yellowing of the eyes or skin Side effects that usually do not require medical attention (report to your doctor or health care professional if they continue or are bothersome):  confusion  decreased hearing  diarrhea  facial  flushing  hair loss  headache  loss of appetite  missed menstrual periods  signs and symptoms of low red blood cells or anemia such as unusually weak or tired; feeling faint or lightheaded; falls  skin discoloration This list may not describe all possible side effects. Call your doctor for medical advice about side effects. You may report side effects to FDA at 1-800-FDA-1088. Where should I keep my medicine? This drug is given in a hospital or clinic and will not be stored at home. NOTE: This sheet is a summary. It may not cover all possible information. If you have questions about this medicine, talk to your doctor, pharmacist, or health care provider.  2020 [employer]/Gold Standard (2018-12-01 09:53:29)

## 2019-03-19 NOTE — Progress Notes (Signed)
[facility] Follow up:    Patient, No Pcp Per No address on file   DIAGNOSIS: Cancer Staging Malignant neoplasm of overlapping sites of left breast in female, estrogen receptor positive ([place]) Staging form: Breast, AJCC 8th Edition - Clinical stage from 03/04/2019: Stage IIIB (cT4, cN1, cM0, G3, ER+, PR+, HER2-) - Signed by Nicholas Lose, MD on 03/04/2019   SUMMARY OF ONCOLOGIC HISTORY: Oncology History  Malignant neoplasm of overlapping sites of left breast in female, estrogen receptor positive ([place])  02/26/2019 Initial Diagnosis   Pregnant lady with 62-monthhistory of left breast swelling and skin thickening, mammogram showed pleomorphic calcifications 8 cm, axilla lymph node biopsy positive ER 60%, PR 60%, Ki-67 50%, HER-2 negative ratio 1.25, anterior and posterior end of the calcifications biopsied: 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 female returns for evaluation prior to receiving neoadjuvant chemotherapy for her estrogen positive breast cancer.  She is going to receive neoadjuvant Adriamycin and Cytoxan given every 3 weeks.  She is also pregnant and is at [redacted] weeks gestation.  She underwent port placement yesterday.  She notes that she feels fine, has no pain, just notes some stiffness in the area.  She had labwork completed on 1/5 that was normal.   She has not yet been scheduled for her genetic testing.     Patient Active Problem  List   Diagnosis Date Noted  . Malignant neoplasm of overlapping sites of left breast in female, estrogen receptor positive ([place] 03/04/2019  . PCOS (polycystic ovarian syndrome) 08/22/2012  . Anovulation 07/09/2012  . ASCUS (atypical squamous cells of undetermined significance) on Pap smear 12/04/2010    has No Known Allergies.  MEDICAL HISTORY: Past Medical History:  Diagnosis Date  . Abnormal Pap smear 07/13/11   ASCUS +HPV  . Cancer ([facility]    breast cancer  . Herpes simplex without mention of complication    dx age 29   SURGICAL HISTORY: Past Surgical History:  Procedure Laterality Date  . COLPOSCOPY     CIN I ECC -  . NO PAST SURGERIES      SOCIAL HISTORY: Social History   Socioeconomic History  . Marital status: Single    Spouse name: Not on file  . Number of children: Not on file  . Years of education: Not on file  . Highest education level: Not on file  Occupational History  . Not on file  Tobacco Use  . Smoking status: Former Smoker    Types: Cigarettes    Quit date: 02/27/2019    Years since quitting: 0.0  . Smokeless tobacco: Never Used  Substance and Sexual Activity  . Alcohol use: Not Currently  . Drug use: Yes    Frequency: 2.0 times per week    Types: Marijuana    Comment: 03/14/2018  . Sexual activity: Yes    Birth control/protection: None  Other Topics Concern  . Not on file  Social History Narrative  .  Not on file   Social Determinants of Health   Financial Resource Strain:   . Difficulty of Paying Living Expenses: Not on file  Food Insecurity:   . Worried About Charity fundraiser in the Last Year: Not on file  . Ran Out of Food in the Last Year: Not on file  Transportation Needs:   . Lack of Transportation (Medical): Not on file  . Lack of Transportation (Non-Medical): Not on file  Physical Activity:   . Days of Exercise per Week: Not on file  . Minutes of Exercise per Session: Not on file  Stress:   . Feeling of Stress : Not on  file  Social Connections:   . Frequency of Communication with Friends and Family: Not on file  . Frequency of Social Gatherings with Friends and Family: Not on file  . Attends Religious Services: Not on file  . Active Member of Clubs or Organizations: Not on file  . Attends Archivist Meetings: Not on file  . Marital Status: Not on file  Intimate Partner Violence:   . Fear of Current or Ex-Partner: Not on file  . Emotionally Abused: Not on file  . Physically Abused: Not on file  . Sexually Abused: Not on file    FAMILY HISTORY: Family History  Problem Relation Age of Onset  . Cancer Paternal Grandmother 62       breast  . Stroke Father 76  . Hypertension Father     Review of Systems  Constitutional: Negative for appetite change, chills, fatigue, fever and unexpected weight change.  HENT:   Negative for hearing loss, lump/mass, sore throat and trouble swallowing.   Eyes: Negative for eye problems and icterus.  Respiratory: Negative for chest tightness, cough and shortness of breath.   Cardiovascular: Negative for chest pain, leg swelling and palpitations.  Gastrointestinal: Negative for abdominal distention, abdominal pain, constipation, diarrhea, nausea and vomiting.  Endocrine: Negative for hot flashes.  Genitourinary: Negative for difficulty urinating.   Musculoskeletal: Negative for arthralgias.  Skin: Negative for itching and rash.  Neurological: Negative for dizziness, extremity weakness, headaches and numbness.  Hematological: Negative for adenopathy. Does not bruise/bleed easily.  Psychiatric/Behavioral: Negative for depression. The patient is not nervous/anxious.       PHYSICAL EXAMINATION  ECOG PERFORMANCE STATUS: 1 - Symptomatic but completely ambulatory  Vitals:   03/19/19 0837  BP: 113/71  Pulse: 81  Resp: 18  Temp: (!) 97.3 F (36.3 C)  SpO2: 100%    Physical Exam Constitutional:      General: She is not in acute distress.     Appearance: Normal appearance. She is not toxic-appearing.  HENT:     Head: Normocephalic and atraumatic.     Mouth/Throat:     Comments: Mask in place Eyes:     General: No scleral icterus.    Pupils: Pupils are equal, round, and reactive to light.  Cardiovascular:     Rate and Rhythm: Normal rate and regular rhythm.  Pulmonary:     Effort: Pulmonary effort is normal.     Breath sounds: Normal breath sounds.     Comments: Left breast with large palpable tumor throughout breast, hardened with skin change present.   Abdominal:     General: Bowel sounds are normal.  Musculoskeletal:        General: No swelling.     Cervical back: Neck supple.  Lymphadenopathy:     Cervical: No cervical adenopathy.  Skin:    General:  Skin is warm and dry.     Capillary Refill: Capillary refill takes less than 2 seconds.     Findings: No rash.  Neurological:     General: No focal deficit present.     Mental Status: She is alert and oriented to person, place, and time.  Psychiatric:        Mood and Affect: Mood normal.        Behavior: Behavior normal.     LABORATORY DATA:  CBC    Component Value Date/Time   WBC 10.1 03/17/2019 1218   RBC 4.10 03/17/2019 1218   HGB 12.5 03/17/2019 1218   HCT 37.4 03/17/2019 1218   PLT 225 03/17/2019 1218   MCV 91.2 03/17/2019 1218   MCH 30.5 03/17/2019 1218   MCHC 33.4 03/17/2019 1218   RDW 13.8 03/17/2019 1218   LYMPHSABS 1.7 03/17/2019 1218   MONOABS 0.6 03/17/2019 1218   EOSABS 0.1 03/17/2019 1218   BASOSABS 0.0 03/17/2019 1218    CMP     Component Value Date/Time   NA 136 03/17/2019 1218   K 3.9 03/17/2019 1218   CL 104 03/17/2019 1218   CO2 22 03/17/2019 1218   GLUCOSE 78 03/17/2019 1218   BUN 9 03/17/2019 1218   CREATININE 0.62 03/17/2019 1218   CALCIUM 8.8 (L) 03/17/2019 1218   PROT 7.2 03/17/2019 1218   ALBUMIN 3.2 (L) 03/17/2019 1218   AST 13 (L) 03/17/2019 1218   ALT 9 03/17/2019 1218   ALKPHOS 70 03/17/2019 1218   BILITOT  0.3 03/17/2019 1218   GFRNONAA >60 03/17/2019 1218   GFRAA >60 03/17/2019 1218       PENDING LABS:   RADIOGRAPHIC STUDIES:  DG Chest Port 1 View  Result Date: 03/18/2019 CLINICAL DATA:  Port catheter placement EXAM: PORTABLE CHEST 1 VIEW COMPARISON:  None. FINDINGS: The heart size and mediastinal contours are within normal limits. Right chest port catheter. Satisfactory placement of the hub and tubing along a right internal jugular course, tip projecting over the upper SVC. Both lungs are clear. The visualized skeletal structures are unremarkable. IMPRESSION: 1. Right chest port catheter. Satisfactory placement of the hub and tubing along a right internal jugular course, tip projecting over the upper SVC. 2. No acute abnormality of the lungs in AP portable projection. Electronically Signed   By: Eddie Candle M.D.   On: 03/18/2019 15:59   DG Fluoro Guide CV Line-No Report  Result Date: 03/18/2019 Fluoroscopy was utilized by the requesting physician.  No radiographic interpretation.   ECHOCARDIOGRAM COMPLETE  Result Date: 03/17/2019   ECHOCARDIOGRAM REPORT   Patient Name:   [facility] Date of Exam: 03/17/2019 Medical Rec #:  [id number]     Height:       63.0 in Accession #:    [id number]    Weight:       200.2 lb Date of Birth:  02/11/1991     BSA:          1.93 m Patient Age:    28 years      BP:           115/73 mmHg Patient Gender: F             HR:           80 bpm. Exam Location:  Outpatient Procedure: 2D Echo, Color Doppler, Cardiac Doppler and Strain Analysis Indications:    Pre-Chemo evaluation  History:        Patient has no prior history  of Echocardiogram examinations.                 Left Breast Cancer, [redacted] weeks pregnant at time of study.  Sonographer:    Raquel Sarna Senior RDCS Referring Phys: [id number] Nicholas Lose  Sonographer Comments: Technically difficult study due to poor echo windows and suboptimal apical window. IMPRESSIONS  1. Left ventricular ejection fraction, by visual estimation,  is 60 to 65%. The left ventricle has normal function. There is no left ventricular hypertrophy.  2. The left ventricle has no regional wall motion abnormalities.  3. The average left ventricular global longitudinal strain is -19.7 %.  4. Global right ventricle has normal systolic function.The right ventricular size is normal. No increase in right ventricular wall thickness.  5. Left atrial size was normal.  6. Right atrial size was normal.  7. The mitral valve is grossly normal. No evidence of mitral valve regurgitation.  8. The tricuspid valve is grossly normal.  9. The aortic valve is tricuspid. Aortic valve regurgitation is not visualized. 10. The pulmonic valve was grossly normal. Pulmonic valve regurgitation is trivial. 11. Normal pulmonary artery systolic pressure. 12. Normal Echocardiogram. FINDINGS  Left Ventricle: Left ventricular ejection fraction, by visual estimation, is 60 to 65%. The left ventricle has normal function. The average left ventricular global longitudinal strain is -19.7 %. The left ventricle has no regional wall motion abnormalities. There is no left ventricular hypertrophy. Left ventricular diastolic parameters were normal. Right Ventricle: The right ventricular size is normal. No increase in right ventricular wall thickness. Global RV systolic function is has normal systolic function. The tricuspid regurgitant velocity is 1.48 m/s, and with an assumed right atrial pressure  of 3 mmHg, the estimated right ventricular systolic pressure is normal at 11.8 mmHg. Left Atrium: Left atrial size was normal in size. Right Atrium: Right atrial size was normal in size Pericardium: There is no evidence of pericardial effusion. Mitral Valve: The mitral valve is grossly normal. No evidence of mitral valve regurgitation. Tricuspid Valve: The tricuspid valve is grossly normal. Tricuspid valve regurgitation is trivial. Aortic Valve: The aortic valve is tricuspid. Aortic valve regurgitation is not  visualized. Pulmonic Valve: The pulmonic valve was grossly normal. Pulmonic valve regurgitation is trivial. Pulmonic regurgitation is trivial. Aorta: The aortic root and ascending aorta are structurally normal, with no evidence of dilitation. IAS/Shunts: No atrial level shunt detected by color flow Doppler.  LEFT VENTRICLE PLAX 2D LVIDd:         4.70 cm  Diastology LVIDs:         3.20 cm  LV e' lateral:   9.36 cm/s LV PW:         0.80 cm  LV E/e' lateral: 9.2 LV IVS:        0.80 cm  LV e' medial:    8.70 cm/s LVOT diam:     2.00 cm  LV E/e' medial:  9.9 LV SV:         61 ml LV SV Index:   29.95    2D Longitudinal Strain LVOT Area:     3.14 cm 2D Strain GLS Avg:     -19.7 %  RIGHT VENTRICLE RV S prime:     13.60 cm/s TAPSE (M-mode): 2.7 cm LEFT ATRIUM             Index       RIGHT ATRIUM           Index LA diam:  3.20 cm 1.65 cm/m  RA Area:     15.80 cm LA Vol (A2C):   41.3 ml 21.35 ml/m RA Volume:   41.40 ml  21.40 ml/m LA Vol (A4C):   51.1 ml 26.42 ml/m LA Biplane Vol: 47.2 ml 24.40 ml/m  AORTIC VALVE LVOT Vmax:   99.90 cm/s LVOT Vmean:  74.300 cm/s LVOT VTI:    0.227 m  AORTA Ao Root diam: 2.30 cm Ao Asc diam:  2.50 cm MITRAL VALVE                        TRICUSPID VALVE MV Area (PHT): 3.21 cm             TR Peak grad:   8.8 mmHg MV PHT:        68.44 msec           TR Vmax:        148.00 cm/s MV Decel Time: 236 msec MV E velocity: 86.50 cm/s 103 cm/s  SHUNTS MV A velocity: 71.60 cm/s 70.3 cm/s Systemic VTI:  0.23 m MV E/A ratio:  1.21       1.5       Systemic Diam: 2.00 cm  Lyman Bishop MD Electronically signed by Lyman Bishop MD Signature Date/Time: 03/17/2019/4:14:03 PM    Final      PATHOLOGY:     ASSESSMENT and THERAPY PLAN:   Malignant neoplasm of overlapping sites of left breast in female, estrogen receptor positive ([place]) 02/26/2019:Pregnant lady with 69-monthhistory of left breast swelling and skin thickening, mammogram showed pleomorphic calcifications 8 cm, axilla lymph node  biopsy positive ER 60%, PR 60%, Ki-67 50%, HER-2 negative ratio 1.25, anterior and posterior end of the calcifications biopsied: 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 After reviewing the pathology in detail, we proceeded to discuss the different treatment options between surgery, radiation, chemotherapy, antiestrogen therapies.  Treatment plan: 1.  Neoadjuvant chemotherapy with dose dense Adriamycin and Cytoxan will be given every 3 weeks without Neulasta support given her pregnancy 2.  After delivery, weekly Taxol x12 3.  Followed by mastectomy if skin biopsy is positive and targeted node dissection versus full axillary lymph node dissection depending on the response. 4.  Adjuvant radiation therapy 5.  Followed by adjuvant antiestrogen therapy with complete estrogen blockade (ovarian suppression with AI)  Additional considerations: Breast MRI cannot be performed in the second trimester.  Therefore it will not be done. CT scans and bone scans also cannot be performed the second trimester.  We will do this after delivery and before surgery.  Plan: Echo completed on 03/16/2018: EF 60-65% Labs on 03/17/2019 normal and reviewed with patient Patient has anti emetics of Ondansetron and Compazine I placed a referral to genetics for testing She would like reconstruction and is open to right mastectomy or reduction depending on her genetic testing results and discussion with Dr. WDonne Hazel   AJacolynknows to keep track of her side effects and call uKoreafor any issues whatsoever We reviewed fluid intake and healthy diet and exercise in detail  She will return to clinic in 1 week for  labs and f/u with Dr. GLindi Adie  Orders Placed This Encounter  Procedures  . Ambulatory referral to Genetics    Referral Priority:   Urgent    Referral Type:   Consultation    Referral Reason:   Specialty Services Required    Number of Visits Requested:   1    All questions were answered. The patient knows to call the clinic with any problems, questions or concerns. We can certainly see the patient much sooner if necessary.  A total of (30) minutes of face-to-face time was spent with this patient with greater than 50% of that time in counseling and care-coordination.  This note was electronically signed. Scot Dock, NP 03/19/2019

## 2019-03-19 NOTE — Progress Notes (Signed)
@  1005 pt c/o "itching all over" at this time pt had only received decadron and aloxi as charted on MAR, this RN assessed skin on back no hives apparent. Pt is not having any breathing problems.  Sandi Mealy PA called to tx area, pepcid hung. VSS as charted in flowsheets and pepcid given as charted on MAR.  Pt states that she is no longer itching @1020 .

## 2019-03-19 NOTE — Assessment & Plan Note (Addendum)
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 After reviewing the pathology in detail, we proceeded to discuss the different treatment options between surgery, radiation, chemotherapy, antiestrogen therapies.  Treatment plan: 1.  Neoadjuvant chemotherapy with dose dense Adriamycin and Cytoxan will be given every 3 weeks without Neulasta support given her pregnancy 2.  After delivery, weekly Taxol x12 3.  Followed by mastectomy if skin biopsy is positive and targeted node dissection versus full axillary lymph node dissection depending on the response. 4.  Adjuvant radiation therapy 5.  Followed by adjuvant antiestrogen therapy with complete estrogen blockade (ovarian suppression with AI)  Additional considerations: Breast MRI cannot be performed in the second trimester.  Therefore it will not be done. CT scans and bone scans also cannot be performed the second trimester.  We will do this after delivery and before surgery.  Plan: Echo completed on 03/16/2018: EF 60-65% Labs on 03/17/2019 normal and reviewed with patient Patient has anti emetics of Ondansetron and Compazine I placed a referral to genetics for testing She would like reconstruction and is open to right mastectomy or reduction depending on her genetic testing results and discussion with Dr. WDonne Hazel   APeterknows to keep track of her  side effects and call uKoreafor any issues whatsoever We reviewed fluid intake and healthy diet and exercise in detail  She will return to clinic in 1 week for labs and f/u with Dr. GLindi Adie

## 2019-03-19 NOTE — Progress Notes (Signed)
[place] CSW Progress Notes  Met w patient in infusion, reviewed needs/progress.  Has gotten full Medicaid through [employer] worker.  Cannot work - on [employer].  Has only worked at her job approx one month, she doubts she has any short term disability benefits.  Family is helping her w living expenses - she does get Physicist, medical and will reapply for [facility].  Application for small grant from [facility] has been submitted, patient aware she needs to bring in additional documents for [employer] application.  Pt concerned that gas card given by [employer] did not work, [employer] advised and left pt VM to stop by her office to resolve issue.  Undersigned CSW was unable to enroll patient in [facility] for additional help w food/gas - will ask CSW Wallis Bamberg to see patient at next [facility] visit.  Edwyna Shell, LCSW Clinical Social Worker Phone:  [phone] Cell:  [phone]

## 2019-03-19 NOTE — Telephone Encounter (Signed)
I talk with patient about 1/12

## 2019-03-20 ENCOUNTER — Telehealth: Payer: Self-pay | Admitting: *Deleted

## 2019-03-20 NOTE — Progress Notes (Signed)
The patient was seen in the infusion room today that she was receiving Decadron Aloxi prior to receiving Adriamycin and Cytoxan.  She reported that she was itching all over.  She was given Pepcid 20 mg IV x1 with resolution of her symptoms.  She was able to proceed with chemotherapy.  She completed her infusion of Adriamycin and Cytoxan without any further issues or concerns.  Sandi Mealy, MHS, PA-C  Physician Assistant

## 2019-03-23 ENCOUNTER — Telehealth: Payer: Self-pay | Admitting: Genetic Counselor

## 2019-03-23 NOTE — Telephone Encounter (Signed)
Called patient regarding upcoming virtual visit, patient has been notified.

## 2019-03-24 ENCOUNTER — Ambulatory Visit (HOSPITAL_BASED_OUTPATIENT_CLINIC_OR_DEPARTMENT_OTHER): Payer: Medicaid Other | Admitting: Genetic Counselor

## 2019-03-24 ENCOUNTER — Encounter: Payer: Self-pay | Admitting: Genetic Counselor

## 2019-03-24 ENCOUNTER — Other Ambulatory Visit: Payer: Self-pay | Admitting: *Deleted

## 2019-03-24 DIAGNOSIS — Z170 Estrogen receptor positive status [ER+]: Secondary | ICD-10-CM | POA: Diagnosis not present

## 2019-03-24 DIAGNOSIS — C50812 Malignant neoplasm of overlapping sites of left female breast: Secondary | ICD-10-CM | POA: Diagnosis not present

## 2019-03-24 DIAGNOSIS — Z800 Family history of malignant neoplasm of digestive organs: Secondary | ICD-10-CM

## 2019-03-24 DIAGNOSIS — Z803 Family history of malignant neoplasm of breast: Secondary | ICD-10-CM

## 2019-03-24 NOTE — Progress Notes (Signed)
REFERRING PROVIDER: Gardenia Phlegm, NP [place],  [place] [address]  PRIMARY PROVIDER:  Patient, No Pcp Per  PRIMARY REASON FOR VISIT:  1. Malignant neoplasm of overlapping sites of left breast in female, estrogen receptor positive ([place])   2. Family history of breast cancer   3. Family history of colon cancer   4. Family history of throat cancer      I connected with Ms. Tan on 03/24/2019 at 11:00 am EDT by MyChart video conference and verified that I am speaking with the correct person using two identifiers.   Patient location: home Provider location: [facility]  HISTORY OF PRESENT ILLNESS:   Ms. Eyster, a 29 y.o. female, was seen for a [place] cancer genetics consultation at the request of Dr. Delice Bison due to a personal and family history of breast cancer.  Ms. Schneider presents to clinic today to discuss the possibility of a hereditary predisposition to cancer, genetic testing, and to further clarify her future cancer risks, as well as potential cancer risks for family members.   In 2020, at the age of 29, Ms. 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  Ovaries intact: yes.  Hysterectomy: no.  Menopausal status: premenopausal.  HRT use: 0 years. Colonoscopy: no. Mammogram within the last year: yes. Any excessive radiation exposure in the past: no  Past Medical History:  Diagnosis Date  . Abnormal Pap smear 07/13/11   ASCUS +HPV  . Cancer ([facility]    breast cancer  . Family history of breast cancer   . Family history of colon cancer   . Family history of throat cancer   . Herpes simplex without mention of complication    dx age [age]   Past Surgical History:  Procedure Laterality Date  . BREAST BIOPSY Left 03/18/2019   Procedure: LEFT BREAST SKIN PUNCH BIOPSY;  Surgeon: WRolm Bookbinder MD;  Location: [facility]  Service: General;  Laterality: Left;  . COLPOSCOPY     CIN I ECC -  . NO PAST SURGERIES    . PORTACATH PLACEMENT N/A 03/18/2019   Procedure: INSERTION PORT-A-CATH WITH ULTRASOUND GUIDANCE;  Surgeon: WRolm Bookbinder MD;  Location: [facility]  Service: General;  Laterality: N/A;    Social History   Socioeconomic History  . Marital status: Single    Spouse name: Not on file  . Number of children: Not on  file  . Years of education: Not on file  . Highest education level: Not on file  Occupational History  . Not on file  Tobacco Use  . Smoking status: Former Smoker    Types: Cigarettes    Quit date: 02/27/2019    Years since quitting: 0.0  . Smokeless tobacco: Never Used  Substance and Sexual Activity  . Alcohol use: Not Currently  . Drug use: Yes    Frequency: 2.0 times per week    Types: Marijuana    Comment: 03/14/2018  . Sexual activity: Yes    Birth control/protection: None  Other Topics Concern  . Not on file  Social History Narrative   . Not on file   Social Determinants of Health   Financial Resource Strain:   . Difficulty of Paying Living Expenses: Not on file  Food Insecurity:   . Worried About Charity fundraiser in the Last Year: Not on file  . Ran Out of Food in the Last Year: Not on file  Transportation Needs:   . Lack of Transportation (Medical): Not on file  . Lack of Transportation (Non-Medical): Not on file  Physical Activity:   . Days of Exercise per Week: Not on file  . Minutes of Exercise per Session: Not on file  Stress:   . Feeling of Stress : Not on file  Social Connections:   . Frequency of Communication with Friends and Family: Not on file  . Frequency of Social Gatherings with Friends and Family: Not on file  . Attends Religious Services: Not on file  . Active Member of Clubs or Organizations: Not on file  . Attends Archivist Meetings: Not on file  . Marital Status: Not on file     FAMILY HISTORY:  We obtained a detailed, 4-generation family history.  Significant diagnoses are listed below: Family History  Problem Relation Age of Onset  . Breast cancer Paternal Grandmother        dx. 48s or younger  . Stroke Father 71  . Hypertension Father   . Cancer Maternal Aunt        unknown type, dx. late 2s  . Colon cancer Paternal Uncle        dx 9s  . Throat cancer Maternal Grandmother        dx. 68s  . Cancer Paternal Uncle        unknown type, dx. 100s  . Breast cancer Cousin        dx. 7s, paternal 1st cousin   Ms. Scheffler has one daughter who is 59 year old, and she is currently 24 weeks and 4 days pregnant with another daughter. She has three sisters and three brothers, one of whom is a maternal half-brother. None of these individuals, nor any of their children, have had cancer.  Ms. Pfefferkorn mother is currently living in her 48s and has not had cancer. She has three maternal uncles and two maternal aunts. One of her uncles died when he was younger than 44 due to a  motorcycle crash. One of her aunts was diagnosed with cancer in her late 4s, although Ms. Dermody is unsure what type of cancer this was. There are no known diagnoses of cancer among any of her maternal first cousins. Ms. Tiede maternal grandmother was diagnosed with with throat cancer in her 52s and had a history of smoking, and her maternal grandfather died in his 69s or 52s without a history of cancer.  Ms. Creely  father is currently living in his 40s and has not had cancer. She has four paternal uncles and one paternal aunt. One of her uncles was diagnosed with colon cancer in his 98s. Ms. Soman is unsure if this uncle has had genetic testing. Another uncle died from cancer in his 46s, although she is unsure what type of cancer this was. Ms. Hollick has one paternal first cousin who was diagnosed with breast cancer in her 8s. Her paternal grandmother died in her 60s or 66s and had a history of breast cancer when she was in her 56s, or possibly younger. She is unsure how old her paternal grandfather was when he died.  Ms. Mask is unaware of previous family history of genetic testing for hereditary cancer risks. Her maternal ancestors are of [place] American descent, and paternal ancestors are of [place] American and Native American descent. There is no reported Ashkenazi Jewish ancestry. There is no known consanguinity.  GENETIC COUNSELING ASSESSMENT: Ms. Khiev is a 29 y.o. female with a personal and family history of breast cancer diagnosed at young ages, which is somewhat suggestive of a hereditary cancer syndrome and predisposition to cancer. We, therefore, discussed and recommended the following at today's visit.   DISCUSSION: We discussed that 5-10% of breast cancer is hereditary, with most cases associated with the BRCA1 and BRCA2 genes.  There are other genes that can be associated with hereditary breast cancer syndromes.  These include ATM, CHEK2, PALB2, etc.  We discussed that testing is  beneficial for several reasons including knowing about other cancer risks, identifying potential screening and risk-reduction options that may be appropriate, and to understand if other family members could be at risk for cancer and allow them to undergo genetic testing.   We reviewed the characteristics, features and inheritance patterns of hereditary cancer syndromes. We also discussed genetic testing, including the appropriate family members to test, the process of testing, insurance coverage and turn-around-time for results. We discussed the implications of a negative, positive and/or variant of uncertain significant result. We recommended Ms. Mclouth pursue genetic testing for the [facility] Breast Cancer Guidelines-Based panel.   The Breast Cancer Guidelines-Based panel offered by [facility] includes sequencing and rearrangement analysis for the following 11 genes:  ATM, BRCA1, BRCA2, CDH1, CHEK2, NBN, NF1, PALB2, PTEN, STK11 and TP53.     Based on Ms. Arens's personal and family history of cancer, she meets medical criteria for genetic testing. Despite that she meets criteria, she may still have an out of pocket cost. We discussed that if her out of pocket cost for testing is over $100, the laboratory will call and confirm whether she wants to proceed with testing.  If the out of pocket cost of testing is less than $100 she will be billed by the genetic testing laboratory.   PLAN: After considering the risks, benefits, and limitations, Ms. Dillie provided informed consent to pursue genetic testing and the blood sample will be sent to [facility] for analysis of the Breast Cancer Guidelines-Based panel. Results should be available within approximately two-three weeks' time, at which point they will be disclosed by telephone to Ms. Ginsberg, as will any additional recommendations warranted by these results. Ms. Ricciardelli will receive a summary of her genetic counseling visit and a copy of her results once  available. This information will also be available in Epic.   Ms. Holzheimer questions were answered to her satisfaction today. Our contact information was provided should additional questions or concerns arise. Thank you for  the referral and allowing [place] to share in the care of your patient.   Clint Guy, MS, [facility] Certified Genetic Counselor [email or web address] Phone: [phone]  The patient was seen for a total of 40 minutes in face-to-face genetic counseling.  This patient was discussed with Drs. Magrinat, Lindi Adie and/or Burr Medico who agrees with the above.    _______________________________________________________________________ For Office Staff:  Number of people involved in session: 1 Was an Intern/ student involved with case: no

## 2019-03-24 NOTE — Progress Notes (Signed)
Patient Care Team: Patient, [name] (General Practice) Melissa Kaufmann, RN as Oncology Nurse Navigator Rockwell Germany, RN as Oncology Nurse Navigator  DIAGNOSIS:    ICD-10-CM   1. 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 with above-mentioned history of left breast cancer currently on neoadjuvant chemotherapy with dose dense Adriamycin and Cytoxan. She is [redacted] weeks pregnant. She presents to the clinic today for a toxicity check following cycle 1.  She has tolerated cycle one chemotherapy extremely well without any side effects.  She was taking Zofran every day which helps prevent any nausea.  ALLERGIES:  has No Known Allergies.  MEDICATIONS:  Current Outpatient Medications  Medication Sig Dispense Refill  . acetaminophen (TYLENOL) 500 MG tablet Take 500 mg  by mouth every 6 (six) hours as needed.      . lidocaine-prilocaine (EMLA) cream Apply 1 application topically once for 1 dose. 30 g 3  . ondansetron (ZOFRAN) 8 MG tablet Take 1 tablet (8 mg total) by mouth every 8 (eight) hours as needed for nausea or vomiting. 20 tablet 0  . Prenatal Multivit-Min-Fe-FA (PRE-NATAL PO) Take 1 tablet by mouth daily.    . prochlorperazine (COMPAZINE) 10 MG tablet Take 1 tablet (10 mg total) by mouth every 6 (six) hours as needed for nausea or vomiting. 30 tablet 0  . traMADol (ULTRAM) 50 MG tablet Take 1 tablet (50 mg total) by mouth every 6 (six) hours as needed. 6 tablet 1   No current facility-administered medications for this visit.    PHYSICAL EXAMINATION: ECOG PERFORMANCE STATUS: 1 - Symptomatic but completely ambulatory  Vitals:   03/25/19 0956  BP: 110/76  Pulse: 95  Resp: 18  Temp: 98.5 F (36.9 C)  SpO2: 100%   Filed Weights   03/25/19 0956  Weight: 197 lb 14.4 oz (89.8 kg)    LABORATORY DATA:  I have reviewed the data as listed CMP Latest Ref Rng & Units 03/17/2019  Glucose 70 - 99 mg/dL 78  BUN 6 - 20 mg/dL 9  Creatinine 0.44 - 1.00 mg/dL 0.62  Sodium 135 - 145 mmol/L 136  Potassium 3.5 - 5.1 mmol/L 3.9  Chloride 98 - 111 mmol/L 104  CO2 22 - 32 mmol/L 22  Calcium 8.9 - 10.3 mg/dL 8.8(L)  Total Protein 6.5 - 8.1 g/dL 7.2  Total Bilirubin 0.3 -  1.2 mg/dL 0.3  Alkaline Phos 38 - 126 U/L 70  AST 15 - 41 U/L 13(L)  ALT 0 - 44 U/L 9    Lab Results  Component Value Date   WBC 5.3 03/25/2019   HGB 11.1 (L) 03/25/2019   HCT 33.4 (L) 03/25/2019   MCV 91.8 03/25/2019   PLT 199 03/25/2019   NEUTROABS PENDING 03/25/2019    ASSESSMENT & PLAN:  Malignant neoplasm of overlapping sites of left breast in female, estrogen receptor positive ([place]) 02/26/2019:Pregnant lady with 15-monthhistory of left breast swelling and skin thickening, mammogram showed pleomorphic calcifications 8 cm, axilla lymph node biopsy positive ER 60%, PR 60%,  Ki-67 50%, HER-2 negative ratio 1.25, anterior and posterior end of the calcifications biopsied: 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  Neoadjuvant chemotherapy with dose dense Adriamycin and Cytoxan will be given every 3 weeks without Neulasta support given her pregnancy 2.  After delivery, weekly Taxol x12 3.  Followed by mastectomy and targeted node dissection versus full axillary lymph node dissection depending on the response. 4.  Adjuvant radiation therapy 5.  Followed by adjuvant antiestrogen therapy with complete estrogen blockade (ovarian suppression with AI) ----------------------------------------------------------------------------------------------------------------------------------------------------- Echo completed on 03/16/2018: EF 60-65% Breast MRI cannot be performed in the second trimester.  Therefore it will not be done. CT scans and bone scans also cannot be performed at this time.  We will do this after delivery and before surgery.  Current treatment: Cycle 1 day 8 every 3 week Adriamycin and Cytoxan Chemo toxicities: She did not have any nausea or vomiting with chemotherapy. She has been eating fairly well She has appointments with her OB coming up.  Return to clinic in 2 weeks for cycle 2   No orders of the defined types were placed in this encounter.  The patient has a good understanding of the overall plan. she agrees with it. she will call with any problems that may develop before the next visit here.  Total time spent: 30 mins including face to face time and time spent for planning, charting and coordination of care  GNicholas Lose MD 03/25/2019  I, MCloyde ReamsDorshimer, am acting as scribe for Dr. VNicholas Lose  I have reviewed the above documentation for  accuracy and completeness, and I agree with the above.

## 2019-03-25 ENCOUNTER — Other Ambulatory Visit: Payer: Self-pay

## 2019-03-25 ENCOUNTER — Encounter: Payer: Self-pay | Admitting: *Deleted

## 2019-03-25 ENCOUNTER — Inpatient Hospital Stay: Payer: Medicaid Other

## 2019-03-25 ENCOUNTER — Inpatient Hospital Stay (HOSPITAL_BASED_OUTPATIENT_CLINIC_OR_DEPARTMENT_OTHER): Payer: Medicaid Other | Admitting: Hematology and Oncology

## 2019-03-25 ENCOUNTER — Telehealth: Payer: Self-pay | Admitting: *Deleted

## 2019-03-25 ENCOUNTER — Other Ambulatory Visit: Payer: Self-pay | Admitting: Genetic Counselor

## 2019-03-25 DIAGNOSIS — Z170 Estrogen receptor positive status [ER+]: Secondary | ICD-10-CM | POA: Diagnosis not present

## 2019-03-25 DIAGNOSIS — Z5111 Encounter for antineoplastic chemotherapy: Secondary | ICD-10-CM | POA: Diagnosis not present

## 2019-03-25 DIAGNOSIS — C50812 Malignant neoplasm of overlapping sites of left female breast: Secondary | ICD-10-CM

## 2019-03-25 DIAGNOSIS — Z95828 Presence of other vascular implants and grafts: Secondary | ICD-10-CM | POA: Insufficient documentation

## 2019-03-25 LAB — CMP (CANCER CENTER ONLY)
ALT: 8 U/L (ref 0–44)
AST: 12 U/L — ABNORMAL LOW (ref 15–41)
Albumin: 3 g/dL — ABNORMAL LOW (ref 3.5–5.0)
Alkaline Phosphatase: 65 U/L (ref 38–126)
Anion gap: 11 (ref 5–15)
BUN: 8 mg/dL (ref 6–20)
CO2: 19 mmol/L — ABNORMAL LOW (ref 22–32)
Calcium: 8.2 mg/dL — ABNORMAL LOW (ref 8.9–10.3)
Chloride: 104 mmol/L (ref 98–111)
Creatinine: 0.66 mg/dL (ref 0.44–1.00)
GFR, Est AFR Am: >60 mL/min (ref >60)
GFR, Estimated: >60 mL/min (ref >60)
Glucose, Bld: 127 mg/dL — ABNORMAL HIGH (ref 70–99)
Potassium: 3.8 mmol/L (ref 3.5–5.1)
Sodium: 134 mmol/L — ABNORMAL LOW (ref 135–145)
Total Bilirubin: 0.3 mg/dL (ref 0.3–1.2)
Total Protein: 6.5 g/dL (ref 6.5–8.1)

## 2019-03-25 LAB — CBC WITH DIFFERENTIAL (CANCER CENTER ONLY)
Abs Immature Granulocytes: 0.04 10*3/uL (ref 0.00–0.07)
Basophils Absolute: 0 10*3/uL (ref 0.0–0.1)
Basophils Relative: 0 %
Eosinophils Absolute: 0.1 10*3/uL (ref 0.0–0.5)
Eosinophils Relative: 2 %
HCT: 33.4 % — ABNORMAL LOW (ref 36.0–46.0)
Hemoglobin: 11.1 g/dL — ABNORMAL LOW (ref 12.0–15.0)
Immature Granulocytes: 1 %
Lymphocytes Relative: 19 %
Lymphs Abs: 1 10*3/uL (ref 0.7–4.0)
MCH: 30.5 pg (ref 26.0–34.0)
MCHC: 33.2 g/dL (ref 30.0–36.0)
MCV: 91.8 fL (ref 80.0–100.0)
Monocytes Absolute: 0 10*3/uL — ABNORMAL LOW (ref 0.1–1.0)
Monocytes Relative: 0 %
Neutro Abs: 4.1 10*3/uL (ref 1.7–7.7)
Neutrophils Relative %: 78 %
Platelet Count: 199 10*3/uL (ref 150–400)
RBC: 3.64 MIL/uL — ABNORMAL LOW (ref 3.87–5.11)
RDW: 13.2 % (ref 11.5–15.5)
WBC Count: 5.3 10*3/uL (ref 4.0–10.5)
nRBC: 0 % (ref 0.0–0.2)

## 2019-03-25 MED ORDER — LIDOCAINE-PRILOCAINE 2.5-2.5 % EX CREA: 1.0000 "application " | TOPICAL_CREAM | Freq: Once | CUTANEOUS | 3 refills | Status: AC

## 2019-03-25 MED ORDER — HEPARIN SOD (PORK) LOCK FLUSH 100 UNIT/ML IV SOLN
500.0000 [IU] | Freq: Once | INTRAVENOUS | Status: AC
  Administered 2019-03-25: 500 [IU]
  Filled 2019-03-25: qty 5

## 2019-03-25 MED ORDER — SODIUM CHLORIDE 0.9% FLUSH
10.0000 mL | Freq: Once | INTRAVENOUS | Status: AC
  Administered 2019-03-25: 10 mL
  Filled 2019-03-25: qty 10

## 2019-03-25 NOTE — Telephone Encounter (Signed)
Referral sent for an appointment with Dr. Iran Planas.  They can see her on 1/21 and will call her with an appointment.  Notes faxed.

## 2019-03-25 NOTE — Progress Notes (Signed)
Per MD request, left breast skin punch suture successfully removed.

## 2019-03-25 NOTE — Progress Notes (Signed)
[place] Work  Clinical Social Work met with patient after medical oncology appointment.  CSW briefly explored patient's concerns, questions, and sources of support.  CSW provided patient with [employer] cards to assist with gas or food.  Patient plans to follow up with CSW team as needed.  Gwinda Maine, LCSW  Clinical Social Worker [facility]

## 2019-03-25 NOTE — Assessment & Plan Note (Signed)
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  Neoadjuvant chemotherapy with dose dense Adriamycin and Cytoxan will be given every 3 weeks without Neulasta support given her pregnancy 2.  After delivery, weekly Taxol x12 3.  Followed by mastectomy and targeted node dissection versus full axillary lymph node dissection depending on the response. 4.  Adjuvant radiation therapy 5.  Followed by adjuvant antiestrogen therapy with complete estrogen blockade (ovarian suppression with AI) ----------------------------------------------------------------------------------------------------------------------------------------------------- Echo completed on 03/16/2018: EF 60-65% Breast MRI cannot be performed in the second trimester.  Therefore it will not be done. CT scans and bone scans also cannot be performed at this time.  We will do this after delivery and before surgery.  Current treatment: Cycle 1 day 1 every 3 week Adriamycin and Cytoxan Antiemetics were reviewed Chemo consent obtained Chemo education completed

## 2019-03-30 ENCOUNTER — Telehealth: Payer: Self-pay | Admitting: *Deleted

## 2019-03-30 NOTE — Telephone Encounter (Signed)
Received call from patient. She states she has noticed some purulent drainage on the bandaid on her port incision.  Port was placed 03/18/19. Denies redness, heat or swelling. Denies fever or chills. Advised that  We can see her in the [facility] tomorrow @ 10am.  Scheduling appt sent.  Pt voiced understanding
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
## 2019-03-31 ENCOUNTER — Inpatient Hospital Stay (HOSPITAL_BASED_OUTPATIENT_CLINIC_OR_DEPARTMENT_OTHER): Payer: Medicaid Other | Admitting: Medical

## 2019-03-31 ENCOUNTER — Inpatient Hospital Stay: Payer: Medicaid Other

## 2019-03-31 ENCOUNTER — Other Ambulatory Visit: Payer: Self-pay

## 2019-03-31 VITALS — BP 123/76 | HR 99 | Temp 98.2°F | Resp 18 | Ht 63.0 in | Wt 200.4 lb

## 2019-03-31 DIAGNOSIS — L0291 Cutaneous abscess, unspecified: Secondary | ICD-10-CM

## 2019-03-31 DIAGNOSIS — T7491XA Unspecified adult maltreatment, confirmed, initial encounter: Secondary | ICD-10-CM | POA: Diagnosis not present

## 2019-03-31 DIAGNOSIS — C50812 Malignant neoplasm of overlapping sites of left female breast: Secondary | ICD-10-CM

## 2019-03-31 DIAGNOSIS — Z170 Estrogen receptor positive status [ER+]: Secondary | ICD-10-CM | POA: Diagnosis not present

## 2019-03-31 DIAGNOSIS — Z5111 Encounter for antineoplastic chemotherapy: Secondary | ICD-10-CM | POA: Diagnosis not present

## 2019-03-31 MED ORDER — MUPIROCIN 2 % EX OINT: 1.0000 "application " | TOPICAL_OINTMENT | Freq: Four times a day (QID) | CUTANEOUS | 1 refills | Status: DC

## 2019-03-31 NOTE — Patient Instructions (Signed)

## 2019-04-01 ENCOUNTER — Encounter: Payer: Self-pay | Admitting: *Deleted

## 2019-04-01 NOTE — Progress Notes (Signed)
These preliminary result these preliminary results were noted.  Awaiting final report.
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
## 2019-04-02 NOTE — Progress Notes (Signed)
These preliminary result these preliminary results were noted.  Awaiting final report.

## 2019-04-03 LAB — AEROBIC CULTURE W GRAM STAIN (SUPERFICIAL SPECIMEN): Gram Stain: NONE SEEN

## 2019-04-08 ENCOUNTER — Inpatient Hospital Stay (HOSPITAL_BASED_OUTPATIENT_CLINIC_OR_DEPARTMENT_OTHER): Payer: Medicaid Other | Admitting: Medical

## 2019-04-08 ENCOUNTER — Other Ambulatory Visit: Payer: Self-pay

## 2019-04-08 ENCOUNTER — Other Ambulatory Visit: Payer: Self-pay | Admitting: *Deleted

## 2019-04-08 ENCOUNTER — Telehealth: Payer: Self-pay | Admitting: Emergency Medicine

## 2019-04-08 VITALS — BP 111/57 | HR 87 | Temp 98.3°F | Resp 16 | Ht 63.0 in | Wt 201.1 lb

## 2019-04-08 DIAGNOSIS — T80219D Unspecified infection due to central venous catheter, subsequent encounter: Secondary | ICD-10-CM | POA: Diagnosis not present

## 2019-04-08 DIAGNOSIS — Z170 Estrogen receptor positive status [ER+]: Secondary | ICD-10-CM | POA: Diagnosis not present

## 2019-04-08 DIAGNOSIS — C50812 Malignant neoplasm of overlapping sites of left female breast: Secondary | ICD-10-CM

## 2019-04-08 DIAGNOSIS — Z5111 Encounter for antineoplastic chemotherapy: Secondary | ICD-10-CM | POA: Diagnosis not present

## 2019-04-08 MED ORDER — AMOXICILLIN-POT CLAVULANATE 875-125 MG PO TABS: 1.0000 | ORAL_TABLET | Freq: Two times a day (BID) | ORAL | 0 refills | Status: DC

## 2019-04-08 NOTE — Progress Notes (Signed)
Patient Care Team: Patient, [name] (General Practice) Melissa Kaufmann, RN as Oncology Nurse Navigator Melissa Germany, RN as Oncology Nurse Navigator  DIAGNOSIS:    ICD-10-CM   1. 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 with above-mentioned history of left breast cancer currently on neoadjuvant chemotherapy with dose dense Adriamycin and Cytoxan. She is [redacted] weeks pregnant. She presents to the clinic today for a toxicity check and cycle 2.    ALLERGIES:  has No Known Allergies.  MEDICATIONS:  Current Outpatient Medications  Medication Sig Dispense Refill  . acetaminophen (TYLENOL) 500 MG tablet Take 500 mg by mouth every 6 (six) hours as needed.      .Marland Kitchenamoxicillin-clavulanate (AUGMENTIN) 875-125 MG tablet Take 1 tablet by mouth 2 (two) times daily. 14 tablet 0    . lidocaine-prilocaine (EMLA) cream APPLY 1 APPLICATION TOPICALLY ONCE FOR 1 DOSE.    . mupirocin ointment (BACTROBAN) 2 % Place 1 application into the nose 4 (four) times daily. 30 g 1  . ondansetron (ZOFRAN) 8 MG tablet Take 1 tablet (8 mg total) by mouth every 8 (eight) hours as needed for nausea or vomiting. 20 tablet 0  . Prenatal Multivit-Min-Fe-FA (PRE-NATAL PO) Take 1 tablet by mouth daily.    . prochlorperazine (COMPAZINE) 10 MG tablet Take 1 tablet (10 mg total) by mouth every 6 (six) hours as needed for nausea or vomiting. 30 tablet 0  . traMADol (ULTRAM) 50 MG tablet Take 1 tablet (50 mg total) by mouth every 6 (six) hours as needed. 6 tablet 1   No current facility-administered medications for this visit.    PHYSICAL EXAMINATION: ECOG PERFORMANCE STATUS: 1 - Symptomatic but completely ambulatory  Vitals:   04/09/19 1016  BP: 130/67  Pulse: 90  Resp: 18  Temp: (!) 97.5 F (36.4 C)  SpO2: 100%   Filed Weights   04/09/19 1016  Weight: 210 lb 1.6 oz (95.3 kg)    LABORATORY DATA:  I have reviewed the data as listed CMP Latest Ref Rng & Units 03/25/2019 03/17/2019  Glucose 70 - 99 mg/dL 127(H) 78  BUN 6 - 20 mg/dL 8 9  Creatinine 0.44 - 1.00 mg/dL 0.66 0.62  Sodium 135 - 145 mmol/L 134(L) 136  Potassium 3.5 - 5.1 mmol/L 3.8 3.9  Chloride 98 - 111 mmol/L 104 104  CO2 22 -  32 mmol/L 19(L) 22  Calcium 8.9 - 10.3 mg/dL 8.2(L) 8.8(L)  Total Protein 6.5 - 8.1 g/dL 6.5 7.2  Total Bilirubin 0.3 - 1.2 mg/dL 0.3 0.3  Alkaline Phos 38 - 126 U/L 65 70  AST 15 - 41 U/L 12(L) 13(L)  ALT 0 - 44 U/L 8 9    Lab Results  Component Value Date   WBC 9.5 04/09/2019   HGB 11.2 (L) 04/09/2019   HCT 33.2 (L) 04/09/2019   MCV 91.5 04/09/2019   PLT 255 04/09/2019   NEUTROABS 6.8 04/09/2019    ASSESSMENT & PLAN:  Malignant neoplasm of overlapping sites of left breast in female, estrogen receptor positive ([place]) 02/26/2019:Pregnant lady with 44-monthhistory of left breast swelling  and skin thickening, mammogram showed pleomorphic calcifications 8 cm, axilla lymph node biopsy positive ER 60%, PR 60%, Ki-67 50%, HER-2 negative ratio 1.25, anterior and posterior end of the calcifications biopsied: 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 Neoadjuvant chemotherapy with dose dense Adriamycin and Cytoxan will be given every 3 weeks without Neulasta support given her pregnancy 2. After delivery, weekly Taxol x12 3. Followed by mastectomy and targeted node dissection versus full axillary lymph node dissection depending on the response. 4. Adjuvant radiation therapy 5. Followed by adjuvant antiestrogen therapy with complete estrogen blockade (ovarian suppression with AI) ----------------------------------------------------------------------------------------------------------------------------------------------------- Echo completed on 03/16/2018: EF 60-65% Breast MRI cannot be performed in the second trimester. Therefore it will not be done. CT scans and bone scans also cannot be performed at this time. We will do this after delivery and before surgery.  Current treatment: Cycle 2 every 3 week Adriamycin and Cytoxan (will be delayed till next week because of the port site infection) Chemo toxicities: 1.  Port site infection: Currently on Augmentin I recommended delaying chemotherapy by week to allow the antibiotics to heal the port site. We will continue to monitor her for toxicities. So far she has tolerated the chemo extremely well.  Social issues: Her aunt who is a great support died recently.  She is very worried because she died of an infection.  She is worried that the current infection may also get worse like her arms and take her life.  She does not have any fevers  or chills and the port site appears to be healing slowly.  Return to clinic in 1 week for cycle 2  No orders of the defined types were placed in this encounter.  The patient has a good understanding of the overall plan. she agrees with it. she will call with any problems that may develop before the next visit here.  Total time spent: 30 mins including face to face time and time spent for planning, charting and coordination of care  GNicholas Lose MD 04/09/2019  I, MCloyde ReamsDorshimer, am acting as scribe for Dr. VNicholas Lose  I have reviewed the above documentation for accuracy and completeness, and I agree with the above.

## 2019-04-08 NOTE — Patient Instructions (Signed)
Wound Care, Adult Taking care of your wound properly can help to prevent pain, infection, and scarring. It can also help your wound to heal more quickly. How to care for your wound Wound care      Follow instructions from your health care provider about how to take care of your wound. Make sure you: ? Wash your hands with soap and water before you change the bandage (dressing). If soap and water are not available, use hand sanitizer. ? Change your dressing as told by your health care provider. ? Leave stitches (sutures), skin glue, or adhesive strips in place. These skin closures may need to stay in place for 2 weeks or longer. If adhesive strip edges start to loosen and curl up, you may trim the loose edges. Do not remove adhesive strips completely unless your health care provider tells you to do that.  Check your wound area every day for signs of infection. Check for: ? Redness, swelling, or pain. ? Fluid or blood. ? Warmth. ? Pus or a bad smell.  Ask your health care provider if you should clean the wound with mild soap and water. Doing this may include: ? Using a clean towel to pat the wound dry after cleaning it. Do not rub or scrub the wound. ? Applying a cream or ointment. Do this only as told by your health care provider. ? Covering the incision with a clean dressing.  Ask your health care provider when you can leave the wound uncovered.  Keep the dressing dry until your health care provider says it can be removed. Do not take baths, swim, use a hot tub, or do anything that would put the wound underwater until your health care provider approves. Ask your health care provider if you can take showers. You may only be allowed to take sponge baths. Medicines   If you were prescribed an antibiotic medicine, cream, or ointment, take or use the antibiotic as told by your health care provider. Do not stop taking or using the antibiotic even if your condition improves.  Take  over-the-counter and prescription medicines only as told by your health care provider. If you were prescribed pain medicine, take it 30 or more minutes before you do any wound care or as told by your health care provider. General instructions  Return to your normal activities as told by your health care provider. Ask your health care provider what activities are safe.  Do not scratch or pick at the wound.  Do not use any products that contain nicotine or tobacco, such as cigarettes and e-cigarettes. These may delay wound healing. If you need help quitting, ask your health care provider.  Keep all follow-up visits as told by your health care provider. This is important.  Eat a diet that includes protein, vitamin A, vitamin C, and other nutrient-rich foods to help the wound heal. ? Foods rich in protein include meat, dairy, beans, nuts, and other sources. ? Foods rich in vitamin A include carrots and dark green, leafy vegetables. ? Foods rich in vitamin C include citrus, tomatoes, and other fruits and vegetables. ? Nutrient-rich foods have protein, carbohydrates, fat, vitamins, or minerals. Eat a variety of healthy foods including vegetables, fruits, and whole grains. Contact a health care provider if:  You received a tetanus shot and you have swelling, severe pain, redness, or bleeding at the injection site.  Your pain is not controlled with medicine.  You have redness, swelling, or pain around the wound.    You have fluid or blood coming from the wound.  Your wound feels warm to the touch.  You have pus or a bad smell coming from the wound.  You have a fever or chills.  You are nauseous or you vomit.  You are dizzy. Get help right away if:  You have a red streak going away from your wound.  The edges of the wound open up and separate.  Your wound is bleeding, and the bleeding does not stop with gentle pressure.  You have a rash.  You faint.  You have trouble  breathing. Summary  Always wash your hands with soap and water before changing your bandage (dressing).  To help with healing, eat foods that are rich in protein, vitamin A, vitamin C, and other nutrients.  Check your wound every day for signs of infection. Contact your health care provider if you suspect that your wound is infected. This information is not intended to replace advice given to you by your health care provider. Make sure you discuss any questions you have with your health care provider. Document Revised: 06/16/2018 Document Reviewed: 09/13/2015 Elsevier Patient Education  2020 Elsevier Inc.  

## 2019-04-08 NOTE — Telephone Encounter (Signed)
Returning pt's VM reporting concerns about infected port not improving w/abx use since last visit.  Pt has appt on 1/28 (tomorrow) w/MD but wants to see if she needs to be seen earlier.  Per PA Lucianne Lei since pt's port culture showed bacterial infection it is advised she comes in today to [facility] to be seen.  Pt agreed to appt time of 1pm so long as she can find child care for her daughter.  Denies fever/chills or SOB/CP, reports continued drainage and possible skin breakdown around port site.  Pt aware to call back before her appt today if there are any changes.  High priority scheduling message sent.
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
## 2019-04-09 ENCOUNTER — Inpatient Hospital Stay (HOSPITAL_BASED_OUTPATIENT_CLINIC_OR_DEPARTMENT_OTHER): Payer: Medicaid Other | Admitting: Hematology and Oncology

## 2019-04-09 ENCOUNTER — Inpatient Hospital Stay: Payer: Medicaid Other

## 2019-04-09 ENCOUNTER — Other Ambulatory Visit: Payer: Self-pay

## 2019-04-09 ENCOUNTER — Encounter: Payer: Self-pay | Admitting: *Deleted

## 2019-04-09 ENCOUNTER — Inpatient Hospital Stay (HOSPITAL_BASED_OUTPATIENT_CLINIC_OR_DEPARTMENT_OTHER): Payer: Medicaid Other | Admitting: Medical

## 2019-04-09 ENCOUNTER — Encounter: Payer: Self-pay | Admitting: General Practice

## 2019-04-09 DIAGNOSIS — C50812 Malignant neoplasm of overlapping sites of left female breast: Secondary | ICD-10-CM

## 2019-04-09 DIAGNOSIS — Z95828 Presence of other vascular implants and grafts: Secondary | ICD-10-CM

## 2019-04-09 DIAGNOSIS — Z5111 Encounter for antineoplastic chemotherapy: Secondary | ICD-10-CM | POA: Diagnosis not present

## 2019-04-09 DIAGNOSIS — Z170 Estrogen receptor positive status [ER+]: Secondary | ICD-10-CM

## 2019-04-09 DIAGNOSIS — L02213 Cutaneous abscess of chest wall: Secondary | ICD-10-CM

## 2019-04-09 LAB — CBC WITH DIFFERENTIAL (CANCER CENTER ONLY)
Abs Immature Granulocytes: 0.48 10*3/uL — ABNORMAL HIGH (ref 0.00–0.07)
Basophils Absolute: 0 10*3/uL (ref 0.0–0.1)
Basophils Relative: 0 %
Eosinophils Absolute: 0 10*3/uL (ref 0.0–0.5)
Eosinophils Relative: 0 %
HCT: 33.2 % — ABNORMAL LOW (ref 36.0–46.0)
Hemoglobin: 11.2 g/dL — ABNORMAL LOW (ref 12.0–15.0)
Immature Granulocytes: 5 %
Lymphocytes Relative: 14 %
Lymphs Abs: 1.4 10*3/uL (ref 0.7–4.0)
MCH: 30.9 pg (ref 26.0–34.0)
MCHC: 33.7 g/dL (ref 30.0–36.0)
MCV: 91.5 fL (ref 80.0–100.0)
Monocytes Absolute: 0.8 10*3/uL (ref 0.1–1.0)
Monocytes Relative: 8 %
Neutro Abs: 6.8 10*3/uL (ref 1.7–7.7)
Neutrophils Relative %: 73 %
Platelet Count: 255 10*3/uL (ref 150–400)
RBC: 3.63 MIL/uL — ABNORMAL LOW (ref 3.87–5.11)
RDW: 13.9 % (ref 11.5–15.5)
WBC Count: 9.5 10*3/uL (ref 4.0–10.5)
nRBC: 0.2 % (ref 0.0–0.2)

## 2019-04-09 LAB — CMP (CANCER CENTER ONLY)
ALT: 7 U/L (ref 0–44)
AST: 9 U/L — ABNORMAL LOW (ref 15–41)
Albumin: 2.9 g/dL — ABNORMAL LOW (ref 3.5–5.0)
Alkaline Phosphatase: 80 U/L (ref 38–126)
Anion gap: 8 (ref 5–15)
BUN: 8 mg/dL (ref 6–20)
CO2: 23 mmol/L (ref 22–32)
Calcium: 8.3 mg/dL — ABNORMAL LOW (ref 8.9–10.3)
Chloride: 107 mmol/L (ref 98–111)
Creatinine: 0.63 mg/dL (ref 0.44–1.00)
GFR, Est AFR Am: >60 mL/min (ref >60)
GFR, Estimated: >60 mL/min (ref >60)
Glucose, Bld: 112 mg/dL — ABNORMAL HIGH (ref 70–99)
Potassium: 3.6 mmol/L (ref 3.5–5.1)
Sodium: 138 mmol/L (ref 135–145)
Total Bilirubin: 0.2 mg/dL — ABNORMAL LOW (ref 0.3–1.2)
Total Protein: 6.2 g/dL — ABNORMAL LOW (ref 6.5–8.1)

## 2019-04-09 LAB — GENETIC SCREENING ORDER

## 2019-04-09 MED ORDER — SODIUM CHLORIDE 0.9% FLUSH
10.0000 mL | Freq: Once | INTRAVENOUS | Status: DC
  Filled 2019-04-09: qty 10

## 2019-04-09 NOTE — Progress Notes (Signed)
The patient was seen in flush today. Her right chest wall port-a-cath wound appears to be healing but continues to have a small open wound in the medial edge which appears to be slightly deeper today. Steri-strips were apply in an attempt close the wound. The patient continues on Augmentin. She presents for chemotherapy today and is seeing Dr. Lindi Adie.  Sandi Mealy, MHS, PA-C Physician Assistant

## 2019-04-09 NOTE — Assessment & Plan Note (Signed)
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 Neoadjuvant chemotherapy with dose dense Adriamycin and Cytoxan will be given every 3 weeks without Neulasta support given her pregnancy 2. After delivery, weekly Taxol x12 3. Followed by mastectomy and targeted node dissection versus full axillary lymph node dissection depending on the response. 4. Adjuvant radiation therapy 5. Followed by adjuvant antiestrogen therapy with complete estrogen blockade (ovarian suppression with AI) ----------------------------------------------------------------------------------------------------------------------------------------------------- Echo completed on 03/16/2018: EF 60-65% Breast MRI cannot be performed in the second trimester. Therefore it will not be done. CT scans and bone scans also cannot be performed at this time. We will do this after delivery and before surgery.  Current treatment: Cycle 2 every 3 week Adriamycin and Cytoxan Chemo toxicities: 1.  Port site infection: Currently on Augmentin I recommended delaying chemotherapy by week to allow the antibiotics to heal the port site. We will continue to monitor her for toxicities. So far she has tolerated the chemo extremely well.  Return to clinic in 1 week for cycle 2

## 2019-04-09 NOTE — Progress Notes (Signed)
[place] Note  Met Melissa [name] briefly today per referral from Alfredia Client and patient request. [name] a birthday card, handmade prayer shawl, and bone pillow as tangible symbols of comfort and care. Because time was limited, we scheduled a phone call for Monday 2/1 and an in-person visit for Friday 2/5 while she is in infusion.   [place], [place], [facility] Pager [phone] Voicemail [phone]

## 2019-04-10 ENCOUNTER — Telehealth: Payer: Self-pay | Admitting: Hematology and Oncology

## 2019-04-10 NOTE — Telephone Encounter (Signed)
I talk with patient regarding schedule  

## 2019-04-13 ENCOUNTER — Encounter: Payer: Self-pay | Admitting: General Practice

## 2019-04-13 NOTE — Progress Notes (Signed)
Spoke to patient after referral to counseling from Lorrin Jackson. Pt agreed to schedule a telehealth counseling appt via phone on Thursday, April 16, 2019 at 4:00pm. Counseling intern will call the pt for this appt.   Art Buff [place] Counseling Intern Voicemail:  [phone]

## 2019-04-13 NOTE — Progress Notes (Signed)
[place] Spiritual Care Note  Had phone appointment with Elmo Putt as scheduled, providing empathic listening, pastoral reflection, normalization of feelings, and affirmation of strengths (including using gratitude and perspective to cope). Mailed handwritten note with full packet of [place] resources, encouraging participation to connect with other people who are dealing with similar vulnerability. Also referring to counseling intern Art Buff with [place]'s permission. We plan to follow up at her infusion on Friday.   [place], [place], [facility] Pager [phone] Voicemail [phone]

## 2019-04-15 DIAGNOSIS — Z1379 Encounter for other screening for genetic and chromosomal anomalies: Secondary | ICD-10-CM | POA: Insufficient documentation

## 2019-04-16 ENCOUNTER — Other Ambulatory Visit: Payer: Self-pay | Admitting: *Deleted

## 2019-04-16 DIAGNOSIS — C50812 Malignant neoplasm of overlapping sites of left female breast: Secondary | ICD-10-CM

## 2019-04-16 DIAGNOSIS — Z170 Estrogen receptor positive status [ER+]: Secondary | ICD-10-CM

## 2019-04-16 NOTE — Progress Notes (Signed)
Patient Care Team: Patient, [name] (General Practice) [name], RN as Oncology Nurse Navigator Rockwell Germany, RN as Oncology Nurse Navigator  DIAGNOSIS:    ICD-10-CM   1. Malignant neoplasm of overlapping sites of left breast in female, estrogen receptor positive ([place])  C50.812    Z17.0   2. 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 with above-mentioned history ofleft breast cancer currently on neoadjuvant chemotherapy with dose dense Adriamycin and Cytoxan. She is [redacted] weeks pregnant.Cycle 2 was held last week due to an infection at her port site. She presents to the clinic todayfor a toxicity check and cycle 2.   The port site infection is markedly better.  Denies any fevers or chills.  ALLERGIES:  has No Known Allergies.  MEDICATIONS:  Current  Outpatient Medications  Medication Sig Dispense Refill  . acetaminophen (TYLENOL) 500 MG tablet Take 500 mg by mouth every 6 (six) hours as needed.      .Marland Kitchenamoxicillin-clavulanate (AUGMENTIN) 875-125 MG tablet Take 1 tablet by mouth 2 (two) times daily. 14 tablet 0  . lidocaine-prilocaine (EMLA) cream APPLY 1 APPLICATION TOPICALLY ONCE FOR 1 DOSE.    . mupirocin ointment (BACTROBAN) 2 % Place 1 application into the nose 4 (four) times daily. 30 g 1  . ondansetron (ZOFRAN) 8 MG tablet Take 1 tablet (8 mg total) by mouth every 8 (eight) hours as needed for nausea or vomiting. 20 tablet 0  . Prenatal Multivit-Min-Fe-FA (PRE-NATAL PO) Take 1 tablet by mouth daily.    . prochlorperazine (COMPAZINE) 10 MG tablet Take 1 tablet (10 mg total) by mouth every 6 (six) hours as needed for nausea or vomiting. 30 tablet 0  . traMADol (ULTRAM) 50 MG tablet Take 1 tablet (50 mg total) by mouth every 6 (six) hours as needed. 6 tablet 1   No current facility-administered medications for this visit.    PHYSICAL EXAMINATION: ECOG PERFORMANCE STATUS: 1 - Symptomatic but completely ambulatory  Vitals:   04/17/19 1040  BP: (!) 112/59  Pulse: 89  Resp: 18  Temp: 98 F (36.7 C)  SpO2: 100%   Filed Weights   04/17/19 1040  Weight: 201 lb 1.6 oz (91.2 kg)    LABORATORY DATA:  I have reviewed the data as listed CMP Latest Ref Rng & Units 04/09/2019 03/25/2019 03/17/2019  Glucose 70 - 99 mg/dL 112(H) 127(H) 78  BUN 6 - 20  mg/dL '8 8 9  '$ Creatinine 0.44 - 1.00 mg/dL 0.63 0.66 0.62  Sodium 135 - 145 mmol/L 138 134(L) 136  Potassium 3.5 - 5.1 mmol/L 3.6 3.8 3.9  Chloride 98 - 111 mmol/L 107 104 104  CO2 22 - 32 mmol/L 23 19(L) 22  Calcium 8.9 - 10.3 mg/dL 8.3(L) 8.2(L) 8.8(L)  Total Protein 6.5 - 8.1 g/dL 6.2(L) 6.5 7.2  Total Bilirubin 0.3 - 1.2 mg/dL 0.2(L) 0.3 0.3  Alkaline Phos 38 - 126 U/L 80 65 70  AST 15 - 41 U/L 9(L) 12(L) 13(L)  ALT 0 - 44 U/L '7 8 9    '$ Lab Results  Component Value Date   WBC 9.0  04/17/2019   HGB 11.3 (L) 04/17/2019   HCT 33.7 (L) 04/17/2019   MCV 91.8 04/17/2019   PLT 201 04/17/2019   NEUTROABS 6.8 04/17/2019    ASSESSMENT & PLAN:  Malignant neoplasm of overlapping sites of left breast in female, estrogen receptor positive ([place]) 02/26/2019:Pregnant lady with 48-monthhistory of left breast swelling and skin thickening, mammogram showed pleomorphic calcifications 8 cm, axilla lymph node biopsy positive ER 60%, PR 60%, Ki-67 50%, HER-2 negative ratio 1.25, anterior and posterior end of the calcifications biopsied: 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 Neoadjuvant chemotherapy with dose dense Adriamycin and Cytoxan will be given every 3 weeks without Neulasta support given her pregnancy 2. After delivery, weekly Taxol x12 3. Followed by mastectomy and targeted node dissection versus full axillary lymph node dissection depending on the response. 4. Adjuvant radiation therapy 5. Followed by adjuvant antiestrogen therapy with complete estrogen blockade (ovarian suppression with AI) ----------------------------------------------------------------------------------------------------------------------------------------------------- Echo completed on 03/16/2018: EF 60-65% Breast MRI cannot be performed in the second trimester. Therefore it will not be done. CT scans and bone scans also cannot be performedat this time. We will do this after delivery and before surgery.  Current treatment: Cycle 2every 3 week Adriamycin and Cytoxan (will be delayed till next week because of the port site infection) Chemo toxicities: Patient tolerated chemotherapy extremely well without any major toxicities. 1.  Port site infection: Treated with antibiotics and delayed  chemotherapy. With infection getting better we will resume her chemo today.  I would like to treat her with 1 more week of Augmentin.  I sent a new prescription today.  Social issues: Her aunt who is a great support died recently.  She is very worried because she died of an infection.  She is worried that the current infection may also get worse like her arms and take her life.  She does not have any fevers or chills and the port site appears to be healing slowly.  Return to clinic in 3 weeks for cycle 3    No orders of the defined types were placed in this encounter.  The patient has a good understanding of the overall plan. she agrees with it. she will call with any problems that may develop before the next visit here.  Total time spent: 30 mins including face to face time and time spent for planning, charting and coordination of care  GNicholas Lose MD 04/17/2019  I, MCloyde ReamsDorshimer, am acting as scribe for Dr. VNicholas Lose  I have reviewed the above documentation for accuracy and completeness, and I agree with the above.

## 2019-04-17 ENCOUNTER — Inpatient Hospital Stay: Payer: Medicaid Other | Attending: Hematology and Oncology

## 2019-04-17 ENCOUNTER — Telehealth: Payer: Self-pay | Admitting: Genetic Counselor

## 2019-04-17 ENCOUNTER — Other Ambulatory Visit: Payer: Self-pay

## 2019-04-17 ENCOUNTER — Inpatient Hospital Stay: Payer: Medicaid Other

## 2019-04-17 ENCOUNTER — Inpatient Hospital Stay (HOSPITAL_BASED_OUTPATIENT_CLINIC_OR_DEPARTMENT_OTHER): Payer: Medicaid Other | Admitting: Hematology and Oncology

## 2019-04-17 ENCOUNTER — Telehealth: Payer: Self-pay | Admitting: Hematology and Oncology

## 2019-04-17 ENCOUNTER — Encounter: Payer: Self-pay | Admitting: General Practice

## 2019-04-17 ENCOUNTER — Inpatient Hospital Stay: Payer: Medicaid Other | Admitting: General Practice

## 2019-04-17 ENCOUNTER — Encounter: Payer: Self-pay | Admitting: *Deleted

## 2019-04-17 ENCOUNTER — Inpatient Hospital Stay (HOSPITAL_BASED_OUTPATIENT_CLINIC_OR_DEPARTMENT_OTHER): Payer: Medicaid Other | Admitting: Medical

## 2019-04-17 ENCOUNTER — Encounter: Payer: Self-pay | Admitting: Genetic Counselor

## 2019-04-17 DIAGNOSIS — T451X5A Adverse effect of antineoplastic and immunosuppressive drugs, initial encounter: Secondary | ICD-10-CM | POA: Diagnosis not present

## 2019-04-17 DIAGNOSIS — Z5111 Encounter for antineoplastic chemotherapy: Secondary | ICD-10-CM | POA: Diagnosis not present

## 2019-04-17 DIAGNOSIS — O9A113 Malignant neoplasm complicating pregnancy, third trimester: Secondary | ICD-10-CM | POA: Insufficient documentation

## 2019-04-17 DIAGNOSIS — C50812 Malignant neoplasm of overlapping sites of left female breast: Secondary | ICD-10-CM | POA: Diagnosis not present

## 2019-04-17 DIAGNOSIS — T148XXA Other injury of unspecified body region, initial encounter: Secondary | ICD-10-CM

## 2019-04-17 DIAGNOSIS — Z170 Estrogen receptor positive status [ER+]: Secondary | ICD-10-CM

## 2019-04-17 DIAGNOSIS — R5383 Other fatigue: Secondary | ICD-10-CM | POA: Diagnosis not present

## 2019-04-17 DIAGNOSIS — D6481 Anemia due to antineoplastic chemotherapy: Secondary | ICD-10-CM | POA: Insufficient documentation

## 2019-04-17 DIAGNOSIS — Z79899 Other long term (current) drug therapy: Secondary | ICD-10-CM | POA: Diagnosis not present

## 2019-04-17 DIAGNOSIS — T80219D Unspecified infection due to central venous catheter, subsequent encounter: Secondary | ICD-10-CM | POA: Insufficient documentation

## 2019-04-17 DIAGNOSIS — Z3A30 30 weeks gestation of pregnancy: Secondary | ICD-10-CM | POA: Diagnosis not present

## 2019-04-17 LAB — CMP (CANCER CENTER ONLY)
ALT: 8 U/L (ref 0–44)
AST: 10 U/L — ABNORMAL LOW (ref 15–41)
Albumin: 3 g/dL — ABNORMAL LOW (ref 3.5–5.0)
Alkaline Phosphatase: 78 U/L (ref 38–126)
Anion gap: 7 (ref 5–15)
BUN: 7 mg/dL (ref 6–20)
CO2: 23 mmol/L (ref 22–32)
Calcium: 8.4 mg/dL — ABNORMAL LOW (ref 8.9–10.3)
Chloride: 106 mmol/L (ref 98–111)
Creatinine: 0.62 mg/dL (ref 0.44–1.00)
GFR, Est AFR Am: >60 mL/min (ref >60)
GFR, Estimated: >60 mL/min (ref >60)
Glucose, Bld: 101 mg/dL — ABNORMAL HIGH (ref 70–99)
Potassium: 3.7 mmol/L (ref 3.5–5.1)
Sodium: 136 mmol/L (ref 135–145)
Total Bilirubin: 0.3 mg/dL (ref 0.3–1.2)
Total Protein: 6.4 g/dL — ABNORMAL LOW (ref 6.5–8.1)

## 2019-04-17 LAB — CBC WITH DIFFERENTIAL (CANCER CENTER ONLY)
Abs Immature Granulocytes: 0.08 10*3/uL — ABNORMAL HIGH (ref 0.00–0.07)
Basophils Absolute: 0 10*3/uL (ref 0.0–0.1)
Basophils Relative: 0 %
Eosinophils Absolute: 0 10*3/uL (ref 0.0–0.5)
Eosinophils Relative: 0 %
HCT: 33.7 % — ABNORMAL LOW (ref 36.0–46.0)
Hemoglobin: 11.3 g/dL — ABNORMAL LOW (ref 12.0–15.0)
Immature Granulocytes: 1 %
Lymphocytes Relative: 15 %
Lymphs Abs: 1.4 10*3/uL (ref 0.7–4.0)
MCH: 30.8 pg (ref 26.0–34.0)
MCHC: 33.5 g/dL (ref 30.0–36.0)
MCV: 91.8 fL (ref 80.0–100.0)
Monocytes Absolute: 0.7 10*3/uL (ref 0.1–1.0)
Monocytes Relative: 7 %
Neutro Abs: 6.8 10*3/uL (ref 1.7–7.7)
Neutrophils Relative %: 77 %
Platelet Count: 201 10*3/uL (ref 150–400)
RBC: 3.67 MIL/uL — ABNORMAL LOW (ref 3.87–5.11)
RDW: 14.1 % (ref 11.5–15.5)
WBC Count: 9 10*3/uL (ref 4.0–10.5)
nRBC: 0 % (ref 0.0–0.2)

## 2019-04-17 MED ORDER — ONDANSETRON HCL 8 MG PO TABS
8.0000 mg | ORAL_TABLET | Freq: Once | ORAL | Status: AC
  Administered 2019-04-17: 12:00:00 8 mg via ORAL

## 2019-04-17 MED ORDER — HEPARIN SOD (PORK) LOCK FLUSH 100 UNIT/ML IV SOLN
500.0000 [IU] | Freq: Once | INTRAVENOUS | Status: AC | PRN
  Administered 2019-04-17: 500 [IU]
  Filled 2019-04-17: qty 5

## 2019-04-17 MED ORDER — SODIUM CHLORIDE 0.9 % IV SOLN
600.0000 mg/m2 | Freq: Once | INTRAVENOUS | Status: AC
  Administered 2019-04-17: 1200 mg via INTRAVENOUS
  Filled 2019-04-17: qty 60

## 2019-04-17 MED ORDER — DEXAMETHASONE SODIUM PHOSPHATE 10 MG/ML IJ SOLN
INTRAMUSCULAR | Status: AC
  Filled 2019-04-17: qty 1

## 2019-04-17 MED ORDER — LORAZEPAM 2 MG/ML IJ SOLN
INTRAMUSCULAR | Status: AC
  Filled 2019-04-17: qty 1

## 2019-04-17 MED ORDER — DOXORUBICIN HCL CHEMO IV INJECTION 2 MG/ML
60.0000 mg/m2 | Freq: Once | INTRAVENOUS | Status: AC
  Administered 2019-04-17: 13:00:00 120 mg via INTRAVENOUS
  Filled 2019-04-17: qty 60

## 2019-04-17 MED ORDER — DEXAMETHASONE SODIUM PHOSPHATE 10 MG/ML IJ SOLN
10.0000 mg | Freq: Once | INTRAMUSCULAR | Status: AC
  Administered 2019-04-17: 10 mg via INTRAVENOUS

## 2019-04-17 MED ORDER — SODIUM CHLORIDE 0.9 % IV SOLN
Freq: Once | INTRAVENOUS | Status: AC
  Filled 2019-04-17: qty 250

## 2019-04-17 MED ORDER — LORAZEPAM 2 MG/ML IJ SOLN
0.5000 mg | Freq: Once | INTRAMUSCULAR | Status: AC
  Administered 2019-04-17: 12:00:00 0.5 mg via INTRAVENOUS

## 2019-04-17 MED ORDER — AMOXICILLIN-POT CLAVULANATE 875-125 MG PO TABS: 1.0000 | ORAL_TABLET | Freq: Two times a day (BID) | ORAL | 0 refills | Status: DC

## 2019-04-17 MED ORDER — ONDANSETRON HCL 8 MG PO TABS
ORAL_TABLET | ORAL | Status: AC
  Filled 2019-04-17: qty 1

## 2019-04-17 MED ORDER — SODIUM CHLORIDE 0.9% FLUSH
10.0000 mL | INTRAVENOUS | Status: DC | PRN
  Administered 2019-04-17: 10 mL
  Filled 2019-04-17: qty 10

## 2019-04-17 NOTE — Progress Notes (Signed)
[place] Spiritual Care Note  Followed up with Melissa [name] in infusion as planned. Provided pastoral presence, reflective listening, and a blessing as she coped with frustration at a series of waits (starting before arrival at [facility]). Plan to continue to build rapport for emotional support by following up at next infusion.   [place], [place], [facility] Pager [phone] Voicemail [phone]

## 2019-04-17 NOTE — Patient Instructions (Signed)

## 2019-04-17 NOTE — Telephone Encounter (Signed)
I talk with patient regarding schedule  

## 2019-04-17 NOTE — Assessment & Plan Note (Signed)
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 Neoadjuvant chemotherapy with dose dense Adriamycin and Cytoxan will be given every 3 weeks without Neulasta support given her pregnancy 2. After delivery, weekly Taxol x12 3. Followed by mastectomy and targeted node dissection versus full axillary lymph node dissection depending on the response. 4. Adjuvant radiation therapy 5. Followed by adjuvant antiestrogen therapy with complete estrogen blockade (ovarian suppression with AI) ----------------------------------------------------------------------------------------------------------------------------------------------------- Echo completed on 03/16/2018: EF 60-65% Breast MRI cannot be performed in the second trimester. Therefore it will not be done. CT scans and bone scans also cannot be performedat this time. We will do this after delivery and before surgery.  Current treatment: Cycle 2every 3 week Adriamycin and Cytoxan (will be delayed till next week because of the port site infection) Chemo toxicities: Patient tolerated chemotherapy extremely well without any major toxicities. 1.  Port site infection: Treated with antibiotics and delayed chemotherapy. With infection getting better we will resume her chemo today.  Social issues: Her aunt who is a great support died recently.  She is very worried because she died of an infection.  She is worried that the current infection may also get worse like her arms and  take her life.  She does not have any fevers or chills and the port site appears to be healing slowly.  Return to clinic in 3 weeks for cycle 3

## 2019-04-17 NOTE — Progress Notes (Signed)
[place] CSW Progress Notes  Check in visit w patient during infusion.  She has received one time grant from [facility].  She has not completed paperwork to submit [employer] which can assist w rent, utilities and similar expenses.  Is waiting on W2 forms and needs to get letter from employer.  Cautioned her that [employer] may be nearly used up, message received to that effect from [employer].  Will email her documents needed list from [facility] so she has it in hand.  States she will fax/bring documents to [employer] as soon as she gets them.  Also gave [employer].  Reports child is doing well in child care and is walking/running.  Doing well w current pregnancy.  No other needs.  Edwyna Shell, LCSW Clinical Social Worker Phone:  [phone] Cell:  [phone]

## 2019-04-17 NOTE — Telephone Encounter (Signed)
Revealed negative genetic testing.  Discussed that we do not know why she has breast cancer or why there is cancer in the family.  It could be due to a different gene that we are not testing, or our current technology may not be able detect something.  It will be important for her to keep in contact with genetics to keep up with whether additional testing may be appropriate in the future.   We also discussed the option to pursue reflex testing, particularly considering the family history of young-onset colon cancer. Ms. Walthour would like to pursue additional genetic testing at this time. We will order reflex testing for the Common Hereditary Cancers panel through [facility]. The Common Hereditary Cancers Panel offered by [facility] includes sequencing and/or deletion duplication testing of the following 48 genes: APC, ATM, AXIN2, BARD1, BMPR1A, BRCA1, BRCA2, BRIP1, CDH1, CDK4, CDKN2A (p14ARF), CDKN2A (p16INK4a), CHEK2, CTNNA1, DICER1, EPCAM (Deletion/duplication testing only), GREM1 (promoter region deletion/duplication testing only), KIT, MEN1, MLH1, MSH2, MSH3, MSH6, MUTYH, NBN, NF1, NHTL1, PALB2, PDGFRA, PMS2, POLD1, POLE, PTEN, RAD50, RAD51C, RAD51D, RNF43, SDHB, SDHC, SDHD, SMAD4, SMARCA4. STK11, TP53, TSC1, TSC2, and VHL.  The following genes are evaluated for sequence changes only: SDHA and HOXB13 c.251G>A variant only.

## 2019-04-20 NOTE — Progress Notes (Signed)
The patient was seen in infusion today for reevaluation of an infected incision immediately superior to her right chest wall Port-A-Cath.  Her incision continued to granulate and was healing well without any evidence of exudate.  Her port was approved for usage today.  Sandi Mealy, MHS, PA-C Physician Assistant

## 2019-04-20 NOTE — Progress Notes (Signed)
This RN witnessed waste of Ativan 1.5 mg of 2 mg vial in Stericycle with Vickii Penna, RN.

## 2019-04-20 NOTE — Progress Notes (Signed)
Patient given 0.5mg  Ativan as a premedication for her treatment. Dose drawn up and verified with Eduardo Osier, RN, and administered as ordered. Remaining medication from 2mg /mL vial was discarded in Stericycle, but not wasted in Pyxis.
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
## 2019-04-30 ENCOUNTER — Other Ambulatory Visit: Payer: Medicaid Other

## 2019-04-30 ENCOUNTER — Ambulatory Visit: Payer: Medicaid Other

## 2019-04-30 ENCOUNTER — Ambulatory Visit: Payer: Medicaid Other | Admitting: Hematology and Oncology
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
## 2019-05-04 ENCOUNTER — Telehealth: Payer: Self-pay | Admitting: Genetic Counselor

## 2019-05-04 NOTE — Telephone Encounter (Signed)
LVM that her genetic test results are available and requested that she call back to discuss them.  

## 2019-05-06 ENCOUNTER — Encounter: Payer: Self-pay | Admitting: *Deleted

## 2019-05-06 NOTE — Progress Notes (Signed)
Patient Care Team: Patient, [name] (General Practice) Melissa Kaufmann, RN as Oncology Nurse Navigator Melissa Germany, RN as Oncology Nurse Navigator  DIAGNOSIS:    ICD-10-CM   1. 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 Reflex testing for the Common Hereditary Cancers panel is pending. The report date is 04/15/2019.  The Breast Cancer Guidelines-Based panel offered by [facility] includes sequencing and rearrangement analysis for the following 11 genes:  ATM, BRCA1, BRCA2, CDH1, CHEK2, NBN, NF1, PALB2, PTEN, STK11 and TP53.       CHIEF COMPLIANT: Cycle3Adriamycin and Cytoxan  INTERVAL HISTORY: AWylma [name] a 29y.o. with above-mentioned history of left breast cancer currently on neoadjuvant chemotherapy with dose dense  Adriamycin and Cytoxan. She is [redacted]weeks pregnant. She presents to the clinic todayfor a toxicity checkandcycle3.  She has tolerated the treatment fairly well with exception of some mild to moderate fatigue.  She has been taking naps at 3:00 every day.  She has noticed a spot on the left breast and wants me to check on her today.  ALLERGIES:  has No Known Allergies.  MEDICATIONS:  Current Outpatient Medications  Medication Sig Dispense Refill  . acetaminophen (TYLENOL) 500 MG tablet Take 500 mg by mouth every 6 (six) hours as needed.      .Marland Kitchenamoxicillin-clavulanate (AUGMENTIN) 875-125 MG tablet Take 1 tablet by mouth 2 (two) times daily. 14 tablet 0  . lidocaine-prilocaine (EMLA) cream APPLY 1 APPLICATION TOPICALLY ONCE FOR 1 DOSE.    . mupirocin ointment (BACTROBAN) 2 % Place 1 application into the nose 4 (four) times daily. 30 g 1  . ondansetron (ZOFRAN) 8 MG tablet Take 1 tablet (8 mg total) by mouth every 8 (eight) hours as needed for nausea or vomiting. 20 tablet 0  . Prenatal Multivit-Min-Fe-FA (PRE-NATAL PO) Take 1 tablet by mouth daily.    . prochlorperazine (COMPAZINE) 10 MG tablet Take 1 tablet (10 mg total) by mouth every 6 (six) hours as needed for nausea or vomiting. 30 tablet 0  . traMADol (ULTRAM) 50 MG tablet Take 1 tablet (50 mg total) by mouth every 6 (six) hours as needed. 6 tablet 1   No current facility-administered medications for this visit.    PHYSICAL EXAMINATION: ECOG PERFORMANCE STATUS: 1 - Symptomatic but completely ambulatory  Vitals:   05/07/19 0913  BP: 121/69  Pulse: 99  Resp: 20  Temp: 97.8 F (36.6 C)  SpO2: 100%   Filed Weights   05/07/19 0913  Weight: 203 lb 14.4 oz (92.5 kg)    LABORATORY DATA:  I have reviewed the data as listed CMP Latest Ref Rng & Units 04/17/2019 04/09/2019 03/25/2019  Glucose 70 - 99 mg/dL 101(H) 112(H) 127(H)  BUN 6 - 20 mg/dL '7 8 8  '$ Creatinine 0.44 - 1.00 mg/dL 0.62 0.63 0.66  Sodium 135 - 145 mmol/L 136 138 134(L)    Potassium 3.5 - 5.1 mmol/L 3.7 3.6 3.8  Chloride 98 - 111 mmol/L 106 107 104  CO2 22 - 32 mmol/L 23 23 19(L)  Calcium 8.9 - 10.3 mg/dL 8.4(L) 8.3(L) 8.2(L)  Total Protein 6.5 - 8.1 g/dL 6.4(L) 6.2(L) 6.5  Total Bilirubin 0.3 - 1.2 mg/dL 0.3 0.2(L) 0.3  Alkaline Phos 38 - 126 U/L 78 80 65  AST 15 - 41 U/L 10(L) 9(L) 12(L)  ALT 0 - 44 U/L '8 7 8    '$ Lab Results  Component Value Date   WBC 7.0 05/07/2019   HGB 10.5 (L) 05/07/2019   HCT 30.6 (L) 05/07/2019   MCV 89.5 05/07/2019   PLT 182 05/07/2019   NEUTROABS PENDING 05/07/2019    ASSESSMENT & PLAN:  Malignant neoplasm of overlapping sites of left breast in female, estrogen receptor positive ([place]) 02/26/2019:Pregnant lady with 56-monthhistory of left breast swelling and skin thickening, mammogram showed pleomorphic calcifications 8 cm, axilla lymph node biopsy positive ER 60%, PR 60%, Ki-67 50%, HER-2 negative ratio 1.25, anterior and posterior end of the calcifications biopsied: 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 Neoadjuvant chemotherapy with dose dense Adriamycin and Cytoxan will be given every 3 weeks without Neulasta support given her pregnancy 2. After delivery, weekly Taxol x12 3. Followed by mastectomy and targeted node dissection versus full axillary lymph node dissection depending on the response. 4. Adjuvant radiation therapy 5. Followed by adjuvant antiestrogen therapy with complete estrogen blockade (ovarian suppression with AI) ----------------------------------------------------------------------------------------------------------------------------------------------------- Echo completed on 03/16/2018: EF 60-65% Breast MRI cannot be performed in the second trimester. Therefore it will not be done. CT scans  and bone scans also cannot be performedat this time. We will do this after delivery and before surgery.  Current treatment: Cycle2every 3 week Adriamycin and Cytoxan  Chemo toxicities: Patient tolerated chemotherapy extremely well without any major toxicities. 1.  Port site infection: Treated with antibiotics and delayed chemotherapy. 2.  Chemotherapy-induced anemia: Being monitored  Spot on the left breast: Skin discoloration probably from cancer cells are dying.  Breast continues to get softened over time.  There is still some breast lymphedema as well as nodularity from the breast cancer.  We will note to do CTs and bone scan after she delivers.  Social issues: Her aunt who was a great support had died  Return to clinic in 3 weeks for cycle 4    No orders of the defined types were placed in this encounter.  The patient has a good understanding of the overall plan. she agrees with it. she will call with any problems that may develop before the next visit here.  Total time spent: 30 mins including face to face time and time spent for planning, charting and coordination of care  GNicholas Lose MD 05/07/2019  I, Molly Dorshimer, am acting as scribe for Dr. Nicholas Lose.  I have reviewed the above documentation for accuracy and completeness, and I agree with the above.

## 2019-05-07 ENCOUNTER — Other Ambulatory Visit: Payer: Self-pay | Admitting: *Deleted

## 2019-05-07 ENCOUNTER — Encounter: Payer: Self-pay | Admitting: *Deleted

## 2019-05-07 ENCOUNTER — Telehealth: Payer: Self-pay | Admitting: *Deleted

## 2019-05-07 ENCOUNTER — Other Ambulatory Visit: Payer: Self-pay

## 2019-05-07 ENCOUNTER — Inpatient Hospital Stay (HOSPITAL_BASED_OUTPATIENT_CLINIC_OR_DEPARTMENT_OTHER): Payer: Medicaid Other | Admitting: Hematology and Oncology

## 2019-05-07 ENCOUNTER — Inpatient Hospital Stay: Payer: Medicaid Other

## 2019-05-07 ENCOUNTER — Encounter: Payer: Self-pay | Admitting: General Practice

## 2019-05-07 DIAGNOSIS — C50812 Malignant neoplasm of overlapping sites of left female breast: Secondary | ICD-10-CM

## 2019-05-07 DIAGNOSIS — Z170 Estrogen receptor positive status [ER+]: Secondary | ICD-10-CM

## 2019-05-07 DIAGNOSIS — Z95828 Presence of other vascular implants and grafts: Secondary | ICD-10-CM

## 2019-05-07 DIAGNOSIS — Z5111 Encounter for antineoplastic chemotherapy: Secondary | ICD-10-CM | POA: Diagnosis not present

## 2019-05-07 LAB — CBC WITH DIFFERENTIAL (CANCER CENTER ONLY)
Abs Immature Granulocytes: 0.53 10*3/uL — ABNORMAL HIGH (ref 0.00–0.07)
Basophils Absolute: 0.1 10*3/uL (ref 0.0–0.1)
Basophils Relative: 1 %
Eosinophils Absolute: 0 10*3/uL (ref 0.0–0.5)
Eosinophils Relative: 0 %
HCT: 30.6 % — ABNORMAL LOW (ref 36.0–46.0)
Hemoglobin: 10.5 g/dL — ABNORMAL LOW (ref 12.0–15.0)
Immature Granulocytes: 8 %
Lymphocytes Relative: 19 %
Lymphs Abs: 1.3 10*3/uL (ref 0.7–4.0)
MCH: 30.7 pg (ref 26.0–34.0)
MCHC: 34.3 g/dL (ref 30.0–36.0)
MCV: 89.5 fL (ref 80.0–100.0)
Monocytes Absolute: 1 10*3/uL (ref 0.1–1.0)
Monocytes Relative: 14 %
Neutro Abs: 4 10*3/uL (ref 1.7–7.7)
Neutrophils Relative %: 58 %
Platelet Count: 182 10*3/uL (ref 150–400)
RBC: 3.42 MIL/uL — ABNORMAL LOW (ref 3.87–5.11)
RDW: 14.6 % (ref 11.5–15.5)
WBC Count: 7 10*3/uL (ref 4.0–10.5)
nRBC: 0.3 % — ABNORMAL HIGH (ref 0.0–0.2)

## 2019-05-07 LAB — CMP (CANCER CENTER ONLY)
ALT: 22 U/L (ref 0–44)
AST: 24 U/L (ref 15–41)
Albumin: 2.6 g/dL — ABNORMAL LOW (ref 3.5–5.0)
Alkaline Phosphatase: 99 U/L (ref 38–126)
Anion gap: 9 (ref 5–15)
BUN: 5 mg/dL — ABNORMAL LOW (ref 6–20)
CO2: 23 mmol/L (ref 22–32)
Calcium: 8.2 mg/dL — ABNORMAL LOW (ref 8.9–10.3)
Chloride: 107 mmol/L (ref 98–111)
Creatinine: 0.6 mg/dL (ref 0.44–1.00)
GFR, Est AFR Am: >60 mL/min (ref >60)
GFR, Estimated: >60 mL/min (ref >60)
Glucose, Bld: 136 mg/dL — ABNORMAL HIGH (ref 70–99)
Potassium: 3 mmol/L — CL (ref 3.5–5.1)
Sodium: 139 mmol/L (ref 135–145)
Total Bilirubin: 0.4 mg/dL (ref 0.3–1.2)
Total Protein: 6 g/dL — ABNORMAL LOW (ref 6.5–8.1)

## 2019-05-07 MED ORDER — SODIUM CHLORIDE 0.9 % IV SOLN
600.0000 mg/m2 | Freq: Once | INTRAVENOUS | Status: AC
  Administered 2019-05-07: 1200 mg via INTRAVENOUS
  Filled 2019-05-07: qty 60

## 2019-05-07 MED ORDER — ONDANSETRON HCL 8 MG PO TABS
8.0000 mg | ORAL_TABLET | Freq: Once | ORAL | Status: AC
  Administered 2019-05-07: 10:00:00 8 mg via ORAL

## 2019-05-07 MED ORDER — SODIUM CHLORIDE 0.9% FLUSH
10.0000 mL | Freq: Once | INTRAVENOUS | Status: DC
  Filled 2019-05-07: qty 10

## 2019-05-07 MED ORDER — SODIUM CHLORIDE 0.9 % IV SOLN
Freq: Once | INTRAVENOUS | Status: AC
  Filled 2019-05-07: qty 250

## 2019-05-07 MED ORDER — LORAZEPAM 2 MG/ML IJ SOLN
0.5000 mg | Freq: Once | INTRAMUSCULAR | Status: AC
  Administered 2019-05-07: 0.5 mg via INTRAVENOUS

## 2019-05-07 MED ORDER — DOXORUBICIN HCL CHEMO IV INJECTION 2 MG/ML
60.0000 mg/m2 | Freq: Once | INTRAVENOUS | Status: AC
  Administered 2019-05-07: 120 mg via INTRAVENOUS
  Filled 2019-05-07: qty 60

## 2019-05-07 MED ORDER — LORAZEPAM 2 MG/ML IJ SOLN
INTRAMUSCULAR | Status: AC
  Filled 2019-05-07: qty 1

## 2019-05-07 MED ORDER — ONDANSETRON HCL 8 MG PO TABS
ORAL_TABLET | ORAL | Status: AC
  Filled 2019-05-07: qty 1

## 2019-05-07 MED ORDER — DEXAMETHASONE SODIUM PHOSPHATE 10 MG/ML IJ SOLN
INTRAMUSCULAR | Status: AC
  Filled 2019-05-07: qty 1

## 2019-05-07 MED ORDER — HEPARIN SOD (PORK) LOCK FLUSH 100 UNIT/ML IV SOLN
500.0000 [IU] | Freq: Once | INTRAVENOUS | Status: AC | PRN
  Administered 2019-05-07: 500 [IU]
  Filled 2019-05-07: qty 5

## 2019-05-07 MED ORDER — POTASSIUM CHLORIDE ER 10 MEQ PO TBCR: 10.0000 meq | EXTENDED_RELEASE_TABLET | Freq: Every day | ORAL | 0 refills | Status: DC

## 2019-05-07 MED ORDER — SODIUM CHLORIDE 0.9% FLUSH
10.0000 mL | INTRAVENOUS | Status: DC | PRN
  Administered 2019-05-07: 12:00:00 10 mL
  Filled 2019-05-07: qty 10

## 2019-05-07 NOTE — Telephone Encounter (Signed)
Late entry 15 - Jay/lab called. Potassium 3.0. [place] working with [facility] notified at [phone]

## 2019-05-07 NOTE — Progress Notes (Signed)
[facility] Spiritual Care Note  Followed up with Melissa [name] in infusion for social and emotional support. Aside from tiredness, she was in good spirits overall. Taking the long view of her treatment plan can get overwhelming, especially when she thinks about how long it will be before her hair begins to grow back, but she is taking things day by day as best she can. Parenting a 31-year-old and managing her pregnancy are helping her stay in the present moment! Family support continues to be meaningful and helpful.  We plan to follow up at next treatment, but please page if needs arise or circumstances change. Thank you.   [place], [place], [facility] Pager [phone] Voicemail [phone]

## 2019-05-07 NOTE — Progress Notes (Signed)
CRITICAL VALUE ALERT  Critical Value:  Potassium 3.0  Date & Time Notied:  05/07/19 at 09:58  Provider Notified: Nicholas Lose, MD  Orders Received/Actions taken: Per MD pt to begin taking potassium 10 mEq tablet daily.  prescription sent to pharmacy on file.  Thedore Mins, RN in infusion to educate pt on taking daily.

## 2019-05-07 NOTE — Patient Instructions (Signed)
[facility] Discharge Instructions for Patients Receiving Chemotherapy  Today you received the following chemotherapy agents: Adriamycin, cytoxan  To help prevent nausea and vomiting after your treatment, we encourage you to take your nausea medication as prescribed by your physician.    If you develop nausea and vomiting that is not controlled by your nausea medication, call the clinic.   BELOW ARE SYMPTOMS THAT SHOULD BE REPORTED IMMEDIATELY:  *FEVER GREATER THAN 100.5 F  *CHILLS WITH OR WITHOUT FEVER  NAUSEA AND VOMITING THAT IS NOT CONTROLLED WITH YOUR NAUSEA MEDICATION  *UNUSUAL SHORTNESS OF BREATH  *UNUSUAL BRUISING OR BLEEDING  TENDERNESS IN MOUTH AND THROAT WITH OR WITHOUT PRESENCE OF ULCERS  *URINARY PROBLEMS  *BOWEL PROBLEMS  UNUSUAL RASH Items with * indicate a potential emergency and should be followed up as soon as possible.  Feel free to call the clinic should you have any questions or concerns. The clinic phone number is [phone].  Please show the [facility] at check-in to the Emergency Department and triage nurse.

## 2019-05-07 NOTE — Progress Notes (Signed)
Patient started to experience some nasal burning approximately 20 minutes into her cytoxan infusion. Rate was slowed and symptoms improved.  Reyne Dumas, pharmacist was notified and will put note to increase total infusion length of cytoxan to 45 minutes during next visit.

## 2019-05-07 NOTE — Assessment & Plan Note (Signed)
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 Neoadjuvant chemotherapy with dose dense Adriamycin and Cytoxan will be given every 3 weeks without Neulasta support given her pregnancy 2. After delivery, weekly Taxol x12 3. Followed by mastectomy and targeted node dissection versus full axillary lymph node dissection depending on the response. 4. Adjuvant radiation therapy 5. Followed by adjuvant antiestrogen therapy with complete estrogen blockade (ovarian suppression with AI) ----------------------------------------------------------------------------------------------------------------------------------------------------- Echo completed on 03/16/2018: EF 60-65% Breast MRI cannot be performed in the second trimester. Therefore it will not be done. CT scans and bone scans also cannot be performedat this time. We will do this after delivery and before surgery.  Current treatment: Cycle2every 3 week Adriamycin and Cytoxan  Chemo toxicities: Patient tolerated chemotherapy extremely well without any major toxicities. 1.  Port site infection: Treated with antibiotics and delayed chemotherapy.  Social issues: Her aunt who was a great support had died  Return to clinic in 3 weeks for cycle 4 

## 2019-05-08 ENCOUNTER — Ambulatory Visit: Payer: Self-pay | Admitting: Genetic Counselor

## 2019-05-08 DIAGNOSIS — Z1379 Encounter for other screening for genetic and chromosomal anomalies: Secondary | ICD-10-CM

## 2019-05-08 NOTE — Progress Notes (Signed)
Spoke to pt on 05/08/2019 for a telehealth counseling session via phone. Counseling intern provided space for pt to open up about experiences and responded with empathy, compassion, and normalization of feelings. Pt reported her ex-boyfriend has not been bothering her in the past week, but that she had to push back the court date until next week for the 50B she filed because of her chemo appointment. Pt made the difficult decision not to delay her treatment, knowing that the court date would get done soon. Pt stated she understands that she filed the 50B to protect herself and her child, but that her boyfriend's family is making excuses for his behavior which "baffles" her. Pt reported she feels safe in her home as long as the door is locked. Pt and CI discussed how when someone is going through something extremely challenging, it can become clear who the people are in your life that truly support you and care for you. Pt reported she felt hurt that some people are selfish and haven't made an effort to support the pt, but that she is grateful for those who have. Pt reflected that she has done whatever she could to support others when they were going through something but some of the same people aren't here for her now when she needs support. Pt stated she believes you should treat others the way you want to be treated. Pt also reflected about worrying about symptoms and side effects of chemotherapy from time to time, but generally said she has had it easy with this round of chemo and hopes that the next round is also tolerable. Pt stated she sometimes worries about finding care for a newborn baby while she is going through 12 weeks of chemo postpartum, but currently decided that she is going to choose not to worry about that right now since she has other more pressing things to focus on (taking on one thing at a time).  Next Counseling Session: Friday, May 15, 2019 at 2:00pm via phone  [place]  Counseling Intern Voicemail:  [phone]

## 2019-05-08 NOTE — Progress Notes (Signed)
HPI:  Melissa [name] was previously seen in the [facility] clinic due to a personal and family history of breast cancer and concerns regarding a hereditary predisposition to cancer. Please refer to our prior cancer genetics clinic note for more information regarding our discussion, assessment and recommendations, at the time. Melissa [name] recent genetic test results were disclosed to her, as were recommendations warranted by these results. These results and recommendations are discussed in more detail below.  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 ER 50%, PR 30%, Ki-67 50%, HER-2 negative ratio 1.03   03/04/2019 Cancer Staging   Staging form: Breast, AJCC 8th Edition - Clinical stage from 03/04/2019: Stage IIIB (cT4, cN1, cM0, G3, ER+, PR+, HER2-) - Signed by GNicholas Lose MD on 03/04/2019    Neo-Adjuvant Chemotherapy   Adriamycin and Cytoxan x 4 given every three weeks without Neulasta support., followed by weekly Taxol x 12 versus Taxotere Cytoxan after her delivery   04/15/2019 Genetic Testing   Negative genetic testing:  No pathogenic variants detected on the [employer] Breast Cancer Guidelines-Based Panel or the Common Hereditary Cancers Panel. The report date is 04/15/2019.  The Breast Cancer Guidelines-Based panel offered by [facility] includes sequencing and rearrangement analysis for the following 11 genes:  ATM, BRCA1, BRCA2, CDH1, CHEK2, NBN, NF1, PALB2, PTEN, STK11 and TP53.  The Common Hereditary Cancers Panel offered by [facility]  includes sequencing and/or deletion duplication testing of the following 48 genes: APC, ATM, AXIN2, BARD1, BMPR1A, BRCA1, BRCA2, BRIP1, CDH1, CDK4, CDKN2A (p14ARF), CDKN2A (p16INK4a), CHEK2, CTNNA1, DICER1, EPCAM (Deletion/duplication testing only), GREM1 (promoter region deletion/duplication testing only), KIT, MEN1, MLH1, MSH2, MSH3, MSH6, MUTYH, NBN, NF1, NHTL1, PALB2, PDGFRA, PMS2, POLD1, POLE, PTEN, RAD50, RAD51C, RAD51D, RNF43, SDHB, SDHC, SDHD, SMAD4, SMARCA4. STK11, TP53, TSC1, TSC2, and VHL.  The following genes were evaluated for sequence changes only: SDHA and HOXB13 c.251G>A variant only.      FAMILY HISTORY:  We obtained a detailed, 4-generation family history.  Significant diagnoses are listed below: Family History  Problem Relation Age of Onset  . Breast cancer Paternal Grandmother        dx. 559sor younger  . Stroke Father 518 . Hypertension Father   . Cancer Maternal Aunt        unknown type, dx. late 463s . Colon cancer Paternal Uncle        dx 414s . Throat cancer Maternal Grandmother        dx. 848s . Cancer Paternal Uncle        unknown type, dx. 575s . Breast cancer Cousin        dx. 34s paternal 1st cousin   Ms. MPackardhas one daughter who is 148year old, and she is currently pregnant with another daughter. She has three sisters and three brothers, one of whom is a maternal half-brother. None of these individuals, nor any of their children, have had cancer.  Ms. MVesseymother is currently living in her [place] has not had cancer. She has three maternal uncles and two maternal aunts. One of  her uncles died when he was younger than 36 due to a motorcycle crash. One of her aunts was diagnosed with cancer in her late 17s, although Ms. Humphreys is unsure what type of cancer this was. There are no known diagnoses of cancer among any of her maternal first cousins. Melissa [name] maternal grandmother was diagnosed with with throat cancer in her 60s and had a history of smoking,  and her maternal grandfather died in his 12s or 41s without a history of cancer.  Melissa [name] father is currently living in his 36s and has not had cancer. She has four paternal uncles and one paternal aunt. One of her uncles was diagnosed with colon cancer in his 25s. Melissa [name] is unsure if this uncle has had genetic testing. Another uncle died from cancer in his 40s, although she is unsure what type of cancer this was. Melissa [name] has one paternal first cousin who was diagnosed with breast cancer in her 21s. Her paternal grandmother died in her 17s or 21s and had a history of breast cancer when she was in her 31s, or possibly younger. She is unsure how old her paternal grandfather was when he died.  Melissa [name] is unaware of previous family history of genetic testing for hereditary cancer risks. Her maternal ancestors are of [place] American descent, and paternal ancestors are of [place] American and Native American descent. There is no reported Ashkenazi Jewish ancestry. There is no known consanguinity.  GENETIC TEST RESULTS: Genetic testing reported out on 04/15/2019 through the [facility] Breast Cancer Guidelines-Based panel, and reflex testing reported out on 05/04/19 through the [facility] Common Hereditary Cancers panel. No pathogenic variants were detected.   The Breast Cancer Guidelines-Based panel offered by [facility] includes sequencing and rearrangement analysis for the following 11 genes:  ATM, BRCA1, BRCA2, CDH1, CHEK2, NBN, NF1, PALB2, PTEN, STK11 and TP53.  The Common Hereditary Cancers Panel offered by [facility] includes sequencing and/or deletion duplication testing of the following 48 genes: APC, ATM, AXIN2, BARD1, BMPR1A, BRCA1, BRCA2, BRIP1, CDH1, CDK4, CDKN2A (p14ARF), CDKN2A (p16INK4a), CHEK2, CTNNA1, DICER1, EPCAM (Deletion/duplication testing only), GREM1 (promoter region deletion/duplication testing only), KIT, MEN1, MLH1, MSH2, MSH3, MSH6, MUTYH, NBN, NF1, NHTL1, PALB2, PDGFRA, PMS2, POLD1,  POLE, PTEN, RAD50, RAD51C, RAD51D, RNF43, SDHB, SDHC, SDHD, SMAD4, SMARCA4. STK11, TP53, TSC1, TSC2, and VHL.  The following genes were evaluated for sequence changes only: SDHA and HOXB13 c.251G>A variant only. The test report will be scanned into EPIC and located under the Molecular Pathology section of the Results Review tab.  A portion of the result report is included below for reference.     We discussed with Ms. Nissley that because current genetic testing is not perfect, it is possible there may be a gene mutation in one of these genes that current testing cannot detect, but that chance is small.  We also discussed, that there could be another gene that has not yet been discovered, or that we have not yet tested, that is responsible for the cancer diagnoses in the family. It is also possible there is a hereditary cause for the cancer in the family that Ms. Broers did not inherit and therefore was not identified in her testing.  Therefore, it is important to remain in touch with cancer genetics in the future so that we can continue to offer Ms. Amyx the most up to date genetic testing.   CANCER SCREENING RECOMMENDATIONS: Ms. Amaral test result is considered negative (normal).  This means that we have  not identified a hereditary cause for her personal and family history of cancer at this time.  Given Ms. Back's personal and family histories, we must interpret these negative results with some caution.  Families with features suggestive of hereditary risk for cancer tend to have multiple family members with cancer, diagnoses in multiple generations and diagnoses before the age of 45. Ms. Lisa family exhibits some of these features. Thus, this result may simply reflect our current inability to detect all mutations within these genes or there may be a different gene that has not yet been discovered or tested.   An individual's cancer risk and medical management are not determined by genetic test  results alone. Overall cancer risk assessment incorporates additional factors, including personal medical history, family history, and any available genetic information that may result in a personalized plan for cancer prevention and surveillance.  RECOMMENDATIONS FOR FAMILY MEMBERS:  Individuals in this family might be at some increased risk of developing cancer, over the general population risk, simply due to the family history of cancer.  We recommended women in this family have a yearly mammogram beginning at age 88 or 69 years younger than the earliest onset of cancer (whichever comes first), an annual clinical breast exam, and perform monthly breast self-exams. Women in this family should also have a gynecological exam as recommended by their primary provider. All family members should have a colonoscopy by age 66.  FOLLOW-UP: Lastly, we discussed with Ms. Kingry that cancer genetics is a rapidly advancing field and it is possible that new genetic tests will be appropriate for her and/or her family members in the future. We encouraged her to remain in contact with cancer genetics on an annual basis so we can update her personal and family histories and let her know of advances in cancer genetics that may benefit this family.   Our contact number was provided. Ms. Couchman questions were answered to her satisfaction, and she knows she is welcome to call us at anytime with additional questions or concerns.   Clint Guy, MS, [facility] Genetic Counselor [email or web address] Phone: [phone]

## 2019-05-08 NOTE — Telephone Encounter (Signed)
Revealed negative results for her reflex genetic testing, which evaluated hereditary cancer genes associated with colorectal cancers and other types of cancer.  Discussed that we do not know why there is cancer in the family.  It is possible that there is a hereditary cause for the colon cancer in her uncle that she did not inherit.  It could also be due to a different gene that we are not testing, or our current technology may not be able detect something.  As we discussed with her initial genetic test results, it will be important for her to keep in contact with genetics to keep up with whether additional testing may be appropriate in the future.

## 2019-05-12 ENCOUNTER — Telehealth: Payer: Self-pay | Admitting: Emergency Medicine

## 2019-05-12 NOTE — Telephone Encounter (Signed)
Received call from pt requesting any medications to tx or advice for management of loss of taste/food tasting bland after chemo tx.  Advised pt to trying new foods or using more 'tangy' or sharp flavors when eating to stimulate the appetite/body such as eating pickles before each bite.  Pt verbalized understanding, states she will not be trying spicy foods d/t current pregnancy which RN agreed with.  Pt requested to be transferred to [facility] [place], transfer successful.  Pt denies any further questions or concerns at time of transfer.

## 2019-05-21 ENCOUNTER — Ambulatory Visit: Payer: Medicaid Other | Admitting: Hematology and Oncology

## 2019-05-21 ENCOUNTER — Other Ambulatory Visit: Payer: Medicaid Other

## 2019-05-21 ENCOUNTER — Ambulatory Visit: Payer: Medicaid Other

## 2019-05-26 ENCOUNTER — Encounter: Payer: Self-pay | Admitting: Licensed Clinical Social Worker

## 2019-05-26 NOTE — Progress Notes (Signed)
[place] CSW Progress Note  Received [employer] information from patient via fax today. CSW completed medical letter. 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 Mayorquin and she will send this to complete application. CSW will mail once received.  CSW also informed pt that she still has 2 installments left of the [employer]. Discussed additional resource from [employer] and [place] emailed link to apply to patient.   CSW will meet with pt in infusion on Thursday to follow up on foundation applications and give [employer] gift cards.    Edwinna Areola Analysia Dungee, LCSW

## 2019-05-27 NOTE — Progress Notes (Signed)
Received missing information to complete [employer] application from [name]. CSW added to application and put in mail through [facility] today.  Melissa [name] , LCSW

## 2019-05-27 NOTE — Progress Notes (Signed)
Patient Care Team: Patient, [name] (General Practice) Melissa Kaufmann, RN as Oncology Nurse Navigator Melissa Germany, RN as Oncology Nurse Navigator  DIAGNOSIS:    ICD-10-CM   1. 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 ER 50%, PR 30%, Ki-67 50%, HER-2 negative ratio 1.03   03/04/2019 Cancer Staging   Staging form: Breast, AJCC 8th Edition - Clinical stage from 03/04/2019: Stage IIIB (cT4, cN1, cM0, G3, ER+, PR+, HER2-) - Signed by GNicholas Lose MD on 03/04/2019    Neo-Adjuvant Chemotherapy   Adriamycin and Cytoxan x 4 given every three weeks without Neulasta support., followed by weekly Taxol x 12 versus Taxotere Cytoxan after her delivery   04/15/2019 Genetic Testing   Negative genetic testing:  No pathogenic variants detected on the [facility] Breast Cancer Guidelines-Based Panel or the Common Hereditary Cancers Panel. The report date is 04/15/2019.  The Breast Cancer Guidelines-Based panel offered by [facility] includes sequencing and rearrangement analysis for the following 11 genes:  ATM, BRCA1, BRCA2, CDH1, CHEK2, NBN, NF1, PALB2, PTEN, STK11 and TP53.  The Common Hereditary Cancers Panel offered by [facility] includes sequencing and/or deletion duplication testing of the following 48 genes: APC, ATM, AXIN2, BARD1, BMPR1A, BRCA1, BRCA2, BRIP1, CDH1, CDK4, CDKN2A (p14ARF), CDKN2A (p16INK4a),  CHEK2, CTNNA1, DICER1, EPCAM (Deletion/duplication testing only), GREM1 (promoter region deletion/duplication testing only), KIT, MEN1, MLH1, MSH2, MSH3, MSH6, MUTYH, NBN, NF1, NHTL1, PALB2, PDGFRA, PMS2, POLD1, POLE, PTEN, RAD50, RAD51C, RAD51D, RNF43, SDHB, SDHC, SDHD, SMAD4, SMARCA4. STK11, TP53, TSC1, TSC2, and VHL.  The following genes were evaluated for sequence changes only: SDHA and HOXB13 c.251G>A variant only.      CHIEF COMPLIANT: Cycle4Adriamycin and Cytoxan  INTERVAL HISTORY: Melissa Chiltonis a 29y.o. with above-mentioned history of left breast cancer currently on neoadjuvant chemotherapy with dose dense Adriamycin and Cytoxan. She is [redacted]weeks pregnant.She presents to the clinic todayfor a toxicity checkandcycle4.   She denies any nausea or vomiting.  She had hypokalemia on the last blood work and will prescribe potassium but she has not been taking it as recommended.  She is also not eating a proper diet.  She has not been eating enough protein.  ALLERGIES:  has No Known Allergies.  MEDICATIONS:  Current Outpatient Medications  Medication Sig Dispense Refill  . acetaminophen (TYLENOL) 500 MG tablet Take 500 mg by mouth every 6 (six) hours as needed.      .[place] Kitchenamoxicillin-clavulanate (AUGMENTIN) 875-125 MG tablet Take 1 tablet by mouth 2 (two) times daily. 14 tablet 0  . lidocaine-prilocaine (EMLA) cream APPLY 1 APPLICATION TOPICALLY ONCE FOR 1 DOSE.    . mupirocin ointment (BACTROBAN) 2 % Place 1 application into the nose 4 (four) times daily. 30 g 1  . ondansetron (ZOFRAN) 8 MG tablet Take 1 tablet (8 mg total) by mouth every 8 (eight) hours as needed for nausea or vomiting. 20 tablet 0  . potassium chloride (KLOR-CON) 10 MEQ tablet  Take 1 tablet (10 mEq total) by mouth daily. 30 tablet 0  . Prenatal Multivit-Min-Fe-FA (PRE-NATAL PO) Take 1 tablet by mouth daily.    . prochlorperazine (COMPAZINE) 10 MG tablet Take 1 tablet (10 mg total) by mouth every 6 (six) hours as needed  for nausea or vomiting. 30 tablet 0  . traMADol (ULTRAM) 50 MG tablet Take 1 tablet (50 mg total) by mouth every 6 (six) hours as needed. 6 tablet 1   No current facility-administered medications for this visit.    PHYSICAL EXAMINATION: ECOG PERFORMANCE STATUS: 1 - Symptomatic but completely ambulatory  Vitals:   05/28/19 0900  BP: 134/76  Pulse: (!) 101  Resp: 19  Temp: 97.8 F (36.6 C)  SpO2: 100%   Filed Weights   05/28/19 0900  Weight: 211 lb 1.6 oz (95.8 kg)     LABORATORY DATA:  I have reviewed the data as listed CMP Latest Ref Rng & Units 05/07/2019 04/17/2019 04/09/2019  Glucose 70 - 99 mg/dL 136(H) 101(H) 112(H)  BUN 6 - 20 mg/dL 5(L) 7 8  Creatinine 0.44 - 1.00 mg/dL 0.60 0.62 0.63  Sodium 135 - 145 mmol/L 139 136 138  Potassium 3.5 - 5.1 mmol/L 3.0(LL) 3.7 3.6  Chloride 98 - 111 mmol/L 107 106 107  CO2 22 - 32 mmol/L '23 23 23  '$ Calcium 8.9 - 10.3 mg/dL 8.2(L) 8.4(L) 8.3(L)  Total Protein 6.5 - 8.1 g/dL 6.0(L) 6.4(L) 6.2(L)  Total Bilirubin 0.3 - 1.2 mg/dL 0.4 0.3 0.2(L)  Alkaline Phos 38 - 126 U/L 99 78 80  AST 15 - 41 U/L 24 10(L) 9(L)  ALT 0 - 44 U/L '22 8 7    '$ Lab Results  Component Value Date   WBC 7.0 05/28/2019   HGB 10.3 (L) 05/28/2019   HCT 31.1 (L) 05/28/2019   MCV 92.6 05/28/2019   PLT 235 05/28/2019   NEUTROABS PENDING 05/28/2019    ASSESSMENT & PLAN:  Malignant neoplasm of overlapping sites of left breast in female, estrogen receptor positive ([place]) 02/26/2019:Pregnant lady with 20-monthhistory of left breast swelling and skin thickening, mammogram showed pleomorphic calcifications 8 cm, axilla lymph node biopsy positive ER 60%, PR 60%, Ki-67 50%, HER-2 negative ratio 1.25, anterior and posterior end of the calcifications biopsied: 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 ER 50%, PR 30%, Ki-67 50%, HER-2 negative ratio 1.03 T4N1 stage IIIb clinical stage Skin biopsy: 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 Neoadjuvant chemotherapy with dose dense Adriamycin and Cytoxan will be given every 3 weeks without Neulasta support given her pregnancy 2. After delivery, weekly Taxol x12 3. Followed by mastectomy and targeted node dissection versus full axillary lymph node dissection depending on the response. 4. Adjuvant radiation therapy 5. Followed by adjuvant antiestrogen therapy with complete estrogen blockade (ovarian suppression with AI) ----------------------------------------------------------------------------------------------------------------------------------------------------- Echo completed on 03/16/2018: EF 60-65% Breast MRI cannot be performed in the second trimester. Therefore it will not be done. CT scans and bone scans also cannot be performedat this time. We will do this after delivery and before surgery.  Current treatment: Cycle4every 3 week Adriamycin and Cytoxan  Chemo toxicities:Patient tolerated chemotherapy extremely well without any major toxicities. 1.Port site infection: Treated with antibiotics and it resolved 2.  Chemotherapy-induced anemia: Being monitored 3.  Hypokalemia: Potassium replacement but patient has not taken yet as prescribed. 4.  Hypoalbuminemia: I stressed the importance of eating more protein on a daily basis.    We  will note to do CTs and bone scan after she delivers.  Return to clinic after delivery to start Taxol (about 3 to 4 weeks after delivery if her OB allows).    No orders of the defined types were placed in this encounter.  The patient has a good understanding of the overall plan. she agrees with it. she will call with any problems that may develop before the next visit here.  Total time spent: 30 mins including face to face time and time spent for planning, charting and coordination of care  Nicholas Lose, MD 05/28/2019  I, Cloyde Reams Dorshimer, am acting as scribe for Dr. Nicholas Lose.  I  have reviewed the above documentation for accuracy and completeness, and I agree with the above.

## 2019-05-28 ENCOUNTER — Encounter: Payer: Self-pay | Admitting: General Practice

## 2019-05-28 ENCOUNTER — Inpatient Hospital Stay (HOSPITAL_BASED_OUTPATIENT_CLINIC_OR_DEPARTMENT_OTHER): Payer: Medicaid Other | Admitting: Hematology and Oncology

## 2019-05-28 ENCOUNTER — Other Ambulatory Visit: Payer: Self-pay

## 2019-05-28 ENCOUNTER — Inpatient Hospital Stay: Payer: Medicaid Other | Admitting: Licensed Clinical Social Worker

## 2019-05-28 ENCOUNTER — Inpatient Hospital Stay: Payer: Medicaid Other

## 2019-05-28 ENCOUNTER — Encounter: Payer: Self-pay | Admitting: *Deleted

## 2019-05-28 ENCOUNTER — Inpatient Hospital Stay: Payer: Medicaid Other | Attending: Hematology and Oncology

## 2019-05-28 ENCOUNTER — Telehealth: Payer: Self-pay | Admitting: *Deleted

## 2019-05-28 ENCOUNTER — Other Ambulatory Visit: Payer: Self-pay | Admitting: *Deleted

## 2019-05-28 VITALS — HR 92

## 2019-05-28 DIAGNOSIS — Z170 Estrogen receptor positive status [ER+]: Secondary | ICD-10-CM

## 2019-05-28 DIAGNOSIS — C50812 Malignant neoplasm of overlapping sites of left female breast: Secondary | ICD-10-CM

## 2019-05-28 DIAGNOSIS — E876 Hypokalemia: Secondary | ICD-10-CM | POA: Diagnosis not present

## 2019-05-28 DIAGNOSIS — Z79899 Other long term (current) drug therapy: Secondary | ICD-10-CM | POA: Diagnosis not present

## 2019-05-28 DIAGNOSIS — T451X5D Adverse effect of antineoplastic and immunosuppressive drugs, subsequent encounter: Secondary | ICD-10-CM | POA: Diagnosis not present

## 2019-05-28 DIAGNOSIS — Z5111 Encounter for antineoplastic chemotherapy: Secondary | ICD-10-CM | POA: Insufficient documentation

## 2019-05-28 DIAGNOSIS — E8809 Other disorders of plasma-protein metabolism, not elsewhere classified: Secondary | ICD-10-CM | POA: Insufficient documentation

## 2019-05-28 DIAGNOSIS — Z95828 Presence of other vascular implants and grafts: Secondary | ICD-10-CM

## 2019-05-28 DIAGNOSIS — D6481 Anemia due to antineoplastic chemotherapy: Secondary | ICD-10-CM | POA: Insufficient documentation

## 2019-05-28 LAB — CMP (CANCER CENTER ONLY)
ALT: 10 U/L (ref 0–44)
AST: 15 U/L (ref 15–41)
Albumin: 2.6 g/dL — ABNORMAL LOW (ref 3.5–5.0)
Alkaline Phosphatase: 105 U/L (ref 38–126)
Anion gap: 10 (ref 5–15)
BUN: 6 mg/dL (ref 6–20)
CO2: 21 mmol/L — ABNORMAL LOW (ref 22–32)
Calcium: 8.1 mg/dL — ABNORMAL LOW (ref 8.9–10.3)
Chloride: 107 mmol/L (ref 98–111)
Creatinine: 0.61 mg/dL (ref 0.44–1.00)
GFR, Est AFR Am: >60 mL/min (ref >60)
GFR, Estimated: >60 mL/min (ref >60)
Glucose, Bld: 125 mg/dL — ABNORMAL HIGH (ref 70–99)
Potassium: 3.1 mmol/L — ABNORMAL LOW (ref 3.5–5.1)
Sodium: 138 mmol/L (ref 135–145)
Total Bilirubin: 0.4 mg/dL (ref 0.3–1.2)
Total Protein: 6 g/dL — ABNORMAL LOW (ref 6.5–8.1)

## 2019-05-28 LAB — CBC WITH DIFFERENTIAL (CANCER CENTER ONLY)
Abs Immature Granulocytes: 0.35 10*3/uL — ABNORMAL HIGH (ref 0.00–0.07)
Basophils Absolute: 0.1 10*3/uL (ref 0.0–0.1)
Basophils Relative: 1 %
Eosinophils Absolute: 0 10*3/uL (ref 0.0–0.5)
Eosinophils Relative: 0 %
HCT: 31.1 % — ABNORMAL LOW (ref 36.0–46.0)
Hemoglobin: 10.3 g/dL — ABNORMAL LOW (ref 12.0–15.0)
Immature Granulocytes: 5 %
Lymphocytes Relative: 29 %
Lymphs Abs: 2.1 10*3/uL (ref 0.7–4.0)
MCH: 30.7 pg (ref 26.0–34.0)
MCHC: 33.1 g/dL (ref 30.0–36.0)
MCV: 92.6 fL (ref 80.0–100.0)
Monocytes Absolute: 1 10*3/uL (ref 0.1–1.0)
Monocytes Relative: 15 %
Neutro Abs: 3.5 10*3/uL (ref 1.7–7.7)
Neutrophils Relative %: 50 %
Platelet Count: 235 10*3/uL (ref 150–400)
RBC: 3.36 MIL/uL — ABNORMAL LOW (ref 3.87–5.11)
RDW: 15.3 % (ref 11.5–15.5)
WBC Count: 7 10*3/uL (ref 4.0–10.5)
nRBC: 1.4 % — ABNORMAL HIGH (ref 0.0–0.2)

## 2019-05-28 MED ORDER — HEPARIN SOD (PORK) LOCK FLUSH 100 UNIT/ML IV SOLN
500.0000 [IU] | Freq: Once | INTRAVENOUS | Status: AC | PRN
  Administered 2019-05-28: 12:00:00 500 [IU]
  Filled 2019-05-28: qty 5

## 2019-05-28 MED ORDER — HEPARIN SOD (PORK) LOCK FLUSH 100 UNIT/ML IV SOLN
500.0000 [IU] | Freq: Once | INTRAVENOUS | Status: DC
  Filled 2019-05-28: qty 5

## 2019-05-28 MED ORDER — SODIUM CHLORIDE 0.9% FLUSH
10.0000 mL | Freq: Once | INTRAVENOUS | Status: AC
  Administered 2019-05-28: 10 mL
  Filled 2019-05-28: qty 10

## 2019-05-28 MED ORDER — ONDANSETRON HCL 8 MG PO TABS
8.0000 mg | ORAL_TABLET | Freq: Once | ORAL | Status: AC
  Administered 2019-05-28: 8 mg via ORAL

## 2019-05-28 MED ORDER — ONDANSETRON HCL 8 MG PO TABS
ORAL_TABLET | ORAL | Status: AC
  Filled 2019-05-28: qty 1

## 2019-05-28 MED ORDER — SODIUM CHLORIDE 0.9 % IV SOLN
Freq: Once | INTRAVENOUS | Status: AC
  Filled 2019-05-28: qty 250

## 2019-05-28 MED ORDER — SODIUM CHLORIDE 0.9% FLUSH
10.0000 mL | INTRAVENOUS | Status: DC | PRN
  Administered 2019-05-28: 12:00:00 10 mL
  Filled 2019-05-28: qty 10

## 2019-05-28 MED ORDER — LORAZEPAM 2 MG/ML IJ SOLN
0.5000 mg | Freq: Once | INTRAMUSCULAR | Status: AC
  Administered 2019-05-28: 10:00:00 0.5 mg via INTRAVENOUS

## 2019-05-28 MED ORDER — DOXORUBICIN HCL CHEMO IV INJECTION 2 MG/ML
60.0000 mg/m2 | Freq: Once | INTRAVENOUS | Status: AC
  Administered 2019-05-28: 120 mg via INTRAVENOUS
  Filled 2019-05-28: qty 60

## 2019-05-28 MED ORDER — SODIUM CHLORIDE 0.9 % IV SOLN
600.0000 mg/m2 | Freq: Once | INTRAVENOUS | Status: AC
  Administered 2019-05-28: 11:00:00 1200 mg via INTRAVENOUS
  Filled 2019-05-28: qty 60

## 2019-05-28 MED ORDER — LORAZEPAM 2 MG/ML IJ SOLN
INTRAMUSCULAR | Status: AC
  Filled 2019-05-28: qty 1

## 2019-05-28 NOTE — Telephone Encounter (Signed)
Per Dr. Lindi Adie, pt will deliver then return to clinic for remainder of chemo (taxol). She will then see Dr. [name] for sx discussion. Called pt to clarify plan and informed she does not need to see Dr. [name] nor have [place] at this time. Informed pt next step is to deliver daughter and once her OB has released her to start tx again, she will call and we will get her started on taxol. Received verbal understanding.

## 2019-05-28 NOTE — Progress Notes (Signed)
[place] Spiritual Care Note  Met with Melissa [name] in infusion for pastoral follow-up. She used opportunity well to share and process recent stressors, as well as the renewed hope she felt at the progress Dr Lindi Adie highlighted during her appointment today. Melissa [name] states that she feels safe in her current living situation. She is eager for baby to come, to conclude pregnancy, and to continue to make progress with treatment. Although she has family offers for help, she notes that she wants to care for her child(ren) herself as much as possible in order to be present and enjoy the experience. Provided pastoral presence, empathic listening, normalization of feelings, and emotional support. Continuing to follow for pastoral support.   [place], [place], [facility] Pager [phone] Voicemail [phone]

## 2019-05-28 NOTE — Assessment & Plan Note (Signed)
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 Neoadjuvant chemotherapy with dose dense Adriamycin and Cytoxan will be given every 3 weeks without Neulasta support given her pregnancy 2. After delivery, weekly Taxol x12 3. Followed by mastectomy and targeted node dissection versus full axillary lymph node dissection depending on the response. 4. Adjuvant radiation therapy 5. Followed by adjuvant antiestrogen therapy with complete estrogen blockade (ovarian suppression with AI) ----------------------------------------------------------------------------------------------------------------------------------------------------- Echo completed on 03/16/2018: EF 60-65% Breast MRI cannot be performed in the second trimester. Therefore it will not be done. CT scans and bone scans also cannot be performedat this time. We will do this after delivery and before surgery.  Current treatment: Cycle4every 3 week Adriamycin and Cytoxan  Chemo toxicities:Patient tolerated chemotherapy extremely well without any major toxicities. 1.Port site infection: Treated with antibiotics and it resolved 2.  Chemotherapy-induced anemia: Being monitored  Spot on the left breast: Skin discoloration probably from cancer cells are dying.  Breast continues to get softened over time.  There is still some breast lymphedema as well as nodularity from the breast cancer.  We will note to do CTs and bone scan after she delivers.  Return  to clinic after delivery to start Taxol.

## 2019-05-28 NOTE — Patient Instructions (Signed)
[facility] Discharge Instructions for Patients Receiving Chemotherapy  Today you received the following chemotherapy agents: Adriamycin, Cytoxin   To help prevent nausea and vomiting after your treatment, we encourage you to take your nausea medication as directed.    If you develop nausea and vomiting that is not controlled by your nausea medication, call the clinic.   BELOW ARE SYMPTOMS THAT SHOULD BE REPORTED IMMEDIATELY:  *FEVER GREATER THAN 100.5 F  *CHILLS WITH OR WITHOUT FEVER  NAUSEA AND VOMITING THAT IS NOT CONTROLLED WITH YOUR NAUSEA MEDICATION  *UNUSUAL SHORTNESS OF BREATH  *UNUSUAL BRUISING OR BLEEDING  TENDERNESS IN MOUTH AND THROAT WITH OR WITHOUT PRESENCE OF ULCERS  *URINARY PROBLEMS  *BOWEL PROBLEMS  UNUSUAL RASH Items with * indicate a potential emergency and should be followed up as soon as possible.  Feel free to call the clinic should you have any questions or concerns. The clinic phone number is [phone].  Please show the CHEMO ALERT CARD at check-in to the Emergency Department and triage nurse.   

## 2019-05-28 NOTE — Telephone Encounter (Signed)
Called pt to congratulate on completing AC. Confirmed appt with Dr. [name] on 3/19 at 3:45. Pt informed she will be induced for delivery on 4/9. Encourage pt to call with needs. Received verbal understanding.

## 2019-05-28 NOTE — Progress Notes (Signed)
[place] CSW Progress Note  Holiday representative met with patient in infusion to provide installment 3 out of 4 of [employer]. Also signed up for and provided [employer] gift card.  [place] application mailed yesterday. No other needs or questions expressed today.    Edwinna Areola Tivis Wherry LCSW

## 2019-05-29 ENCOUNTER — Inpatient Hospital Stay: Payer: Medicaid Other

## 2019-05-29 NOTE — Progress Notes (Signed)
Counseling intern spoke to pt on 05/29/2019 for a telehealth counseling session via phone. Counseling intern provided space for pt to open up about experiences and responded with empathy, compassion, and normalization of feelings. Pt presented with a down mood.  Pt reported having her last chemo tx was this week until her next round begins in April after her baby is born. Pt reported feeling OK physically this week, but is having a harder time mentally because of dealing with her baby's daddy. Pt states she feels better mentally if she doesn't have to see him, but that during a recent interaction his hurtful remarks were distressing. Pt continues to try to "take the high road", but his comments were especially difficult this week because the pt is already feeling anxious about her upcoming scan. Pt stated she is ready to start her chemo as soon as possible after her baby is delivered, but she also worries if she needs a c-section if this could delay the chemo. Pt voiced that she will ask her doctor about this next week.   CI and pt discussed how hard it is to deal with a pregnancy (and soon a newborn due on 06/19/19), a dysfunctional relationship, and her cancer treatment all at once, so feeling overwhelmed is normal. CI and pt discussed how confusing and disheartening it is to hear her friend say cruel things to her when she knows she would never say these things to anyone. The pt stated she is trying to focus on "one thing at a time" and said she tells herself, "Don't worry too much, keep moving." CI expressed that the pt has a proven history of demonstrating her internal strength and she can remind herself of this when she is feeling down. Pt requested CI call next week to check-in.  Next Counseling Session: Friday, June 05, 2019 at 11:00am via phone  [facility] Counseling Intern Voicemail:  [phone]

## 2019-06-02 ENCOUNTER — Other Ambulatory Visit: Payer: Medicaid Other

## 2019-06-05 NOTE — Progress Notes (Signed)
Counseling intern (CI) spoke to pt on 06/05/2019 for a telehealth counseling session via phone. Counseling intern provided space for pt to open up about experiences and responded with empathy, compassion, and normalization of feelings. Pt presented to counseling in a down mood and was less talkative than at previous sessions. CI also had some difficulty hearing the pt today because of the phone connection, which made communicating more difficult.   Pt reported she is worried about her upcoming scan and has been unable to defuse from her negative thoughts about "what if" and "what then". Pt and CI discussed that "scanxiety" is a normal, but difficult psychological experience for patients with cancer. Additionally, pt also mentioned that the upcoming birth of her 2nd child and her subsequent chemotherapy tx is also weighing heavy on her mind. Pt and CI discussed how isolating cancer can feel for the pt and even though friends and family try to be supportive, they often come across as "not really listening" and/or just wanting to minimize or "fix" the problem.   CI provided psychoeducation about anxiety and discussed the benefits of using deep breathing to slow the heart rate and send her brain calming signals. Additionally, CI explained the 54321 grounding exercise as an example of helping to shift her thinking from worry and distress to the present. CI reminded pt that she has previously expressed her awareness of her internal strength, and although she might not be able to feel that strength at this time, it is still there. CI also mentioned self-compassion as a helpful strategy when she feels that nothing else helps.   Pt stated she would like to schedule another counseling session for the following week. CI will check-in with pt at that time to see if there is any improvement in her mood and distress level and offer additional strategies for coping at that time.   Next Counseling Session: Friday, June 12, 2019 at 11:00am via phone  [facility] Counseling Intern Voicemail:  [phone]

## 2019-06-08 ENCOUNTER — Telehealth: Payer: Self-pay | Admitting: *Deleted

## 2019-06-08 NOTE — Telephone Encounter (Signed)
Spoke to pt concerning scans after delivery. Pt scheduled to be induced on 4/9. Informed pt after delivery we will plan on scheduling CT cap and bone scan. Pt also wishes to have [place] of left breast to determine response to chemo. Pt informed her OB has stated she may start 2nd treatment quickly after delivery. Informed pt when she has been cleared from her OB to call and let us know. Received verbal understanding.

## 2019-06-11 ENCOUNTER — Telehealth: Payer: Self-pay | Admitting: Licensed Clinical Social Worker

## 2019-06-11 NOTE — Telephone Encounter (Signed)
Received email from [facility] asking for clarifying information for application. Sent requested information on treatment plan. Spoke with [name] to ensure that she saw the requests for more information on bills and bank statements. She did and will work on gathering and sending that information.   Melissa Areola Hadasah Brugger, LCSW

## 2019-06-12 NOTE — Progress Notes (Signed)
Counseling intern spoke to patient on 06/12/2019 for a telehealth counseling session via phone. Counseling intern provided space for pt to open up about experiences and responded with empathy, compassion, and normalization of feelings. Pt presented to counseling with a flat affect, but her mood was slightly improved compared to the past week.  Pt reported she experienced contractions for 30 minutes this morning, but they have not yet returned. She hopes she can make it to her induction date next Friday as planned. Pt stated she will have support from her mother when the new baby is born, but that she continues to have little financial support from her children's father. Pt reported although she is on a break from chemo, she worries that her cancer might grow while she is not in treatment. Pt recognized that calling the nurse with a question this week helped decrease her anxiety. Additionally, pt stated she worries about what the next round of chemo will be like and is especially scared that if something happens to her, her daughter will not remember her. However, she also recognized that she is tired of worrying. CI and pt discussed the vicious cycle of worrying and how it is hard to get out of this cycle. Pt reflected that she finds she forget to use some of her coping strategies when she is focused on worries. Pt discussed how some family members provide her with support and how others show they care, but have barriers to providing her with help. Pt also reported she was able to identify things she is thankful for and CI discussed how even noticing small everyday stuff to be grateful for can be helpful.   CI and pt processed feelings related to her current experiences. CI provided psycho-education about the vicious cycle of negative thoughts and how thought evaluation & replacement and relaxation strategies can help break the cycle. CI normalized the pt's feelings of isolation during her cancer experience. CI  also encouraged pt to continue using gratitude as a vehicle for increasing positive thinking.  Next Counseling Call: Friday, June 19, 2019 at 11:30am via phone (NOTE: This is pt's scheduled induction day so she may not be available)  [facility] Counseling Intern Voicemail:  [phone]

## 2019-06-16 ENCOUNTER — Other Ambulatory Visit: Payer: Medicaid Other

## 2019-06-18 ENCOUNTER — Encounter: Payer: Self-pay | Admitting: General Practice

## 2019-06-18 NOTE — Progress Notes (Signed)
[name] Spiritual Care Note  Left voicemail of pastoral encouragement (knowing that induction has been scheduled for tomorrow, 4/9). Following from a distance and plan to connect again when Melissa [name] is back in treatment.   [place], [place], [facility] Pager [phone] Voicemail [phone]

## 2019-06-19 NOTE — Progress Notes (Signed)
Counseling Intern (CI) spoke to patient on 05/19/2019 for a telehealth counseling session via phone. CI provided space for pt to open up about experiences and responded with empathy, compassion, and normalization of feelings. Pt presented to counseling in a cheerful mood and matching affect.   Pt reported that her daughter was born this morning at 10:15am. She stated she is feeling OK, but is tired. CI expressed excitement for the birth and compassion about the pt's exhaustion. Pt stated this birth experience was quicker than her first and she is glad to have the birth over with. Pt is hoping that now she can focus on caring for her new baby and starting up her treatments again in the next few weeks. Pt stated she didn't want to talk long today because she is tired and hungry and is looking forward to getting lunch and a nap. CI wrapped up the session and scheduled a follow-up call for next week. Pt stated she welcomes a call from the CI next week to check-in.  Next Scheduled Counseling Session: Friday, June 26, 2019 at 2:00pm  [facility] Counseling Intern Voicemail:  [phone]

## 2019-06-25 ENCOUNTER — Telehealth: Payer: Self-pay | Admitting: Hematology and Oncology

## 2019-06-25 NOTE — Telephone Encounter (Signed)
Scheduled appt per 4/15 sch message - pt is aware of appt date and time   

## 2019-06-29 ENCOUNTER — Telehealth: Payer: Self-pay

## 2019-06-29 ENCOUNTER — Other Ambulatory Visit: Payer: Self-pay

## 2019-06-29 DIAGNOSIS — C50812 Malignant neoplasm of overlapping sites of left female breast: Secondary | ICD-10-CM

## 2019-06-29 NOTE — Progress Notes (Signed)
Pharmacist Chemotherapy Monitoring - Follow Up Assessment    I verify that I have reviewed each item in the below checklist:  . Regimen for the patient is scheduled for the appropriate day and plan matches scheduled date. Marland Kitchen Appropriate non-routine labs are ordered dependent on drug ordered. . If applicable, additional medications reviewed and ordered per protocol based on lifetime cumulative doses and/or treatment regimen.   Plan for follow-up and/or issues identified: No . I-vent associated with next due treatment: No . MD and/or nursing notified: No  Melissa [name] [facility] 06/29/2019 10:49 AM

## 2019-06-29 NOTE — Telephone Encounter (Signed)
Pt reports she was in ED over weekend due to shortness of breath, lower extremity swelling, and cough.    Pt continues with symptoms, however minimal improvement.  Pt on Lasix 20mg  daily X 5 days by ED physician.  Pt states cough, and shortness of breath is increased when laying down.    Pt is s/p Adriamycin and Cytoxan.  RN reviewed with MD.  MD recommendations to obtain echocardiogram and keep follow up schedule for 4/23.   RN scheduled pt for echocardiogram for 4/22 @ 9am.  Pt aware.  RN educated patient to continue Lasix, monitor weight daily and report if changes of 3-5 lbs in one day, and encouraged patient to sleep with head elevated.  Pt verbalized understanding and agreement.

## 2019-06-30 ENCOUNTER — Encounter: Payer: Self-pay | Admitting: *Deleted

## 2019-07-02 ENCOUNTER — Other Ambulatory Visit: Payer: Self-pay

## 2019-07-02 ENCOUNTER — Ambulatory Visit (HOSPITAL_COMMUNITY)
Admission: RE | Admit: 2019-07-02 | Discharge: 2019-07-02 | Disposition: A | Discharge status: Home / Self Care | Payer: Medicaid Other | Source: Ambulatory Visit | Attending: Hematology and Oncology | Admitting: Hematology and Oncology

## 2019-07-02 ENCOUNTER — Telehealth: Payer: Self-pay | Admitting: Hematology and Oncology

## 2019-07-02 DIAGNOSIS — R0602 Shortness of breath: Secondary | ICD-10-CM | POA: Insufficient documentation

## 2019-07-02 DIAGNOSIS — C50812 Malignant neoplasm of overlapping sites of left female breast: Secondary | ICD-10-CM

## 2019-07-02 DIAGNOSIS — Z01818 Encounter for other preprocedural examination: Secondary | ICD-10-CM | POA: Insufficient documentation

## 2019-07-02 DIAGNOSIS — I081 Rheumatic disorders of both mitral and tricuspid valves: Secondary | ICD-10-CM | POA: Insufficient documentation

## 2019-07-02 DIAGNOSIS — I313 Pericardial effusion (noninflammatory): Secondary | ICD-10-CM | POA: Diagnosis not present

## 2019-07-02 DIAGNOSIS — I272 Pulmonary hypertension, unspecified: Secondary | ICD-10-CM | POA: Insufficient documentation

## 2019-07-02 DIAGNOSIS — R931 Abnormal findings on diagnostic imaging of heart and coronary circulation: Secondary | ICD-10-CM

## 2019-07-02 DIAGNOSIS — Z170 Estrogen receptor positive status [ER+]: Secondary | ICD-10-CM | POA: Diagnosis not present

## 2019-07-02 NOTE — Telephone Encounter (Signed)
Rescheduled 04/23 appointment timnes due to providers pal, patient has been called and notified.

## 2019-07-02 NOTE — Progress Notes (Signed)
  Echocardiogram 2D Echocardiogram has been performed.  Melissa [name] 07/02/2019, 10:10 AM

## 2019-07-02 NOTE — Progress Notes (Signed)
Patient Care Team: Patient, [name] (General Practice) [name], RN as Oncology Nurse Navigator Rockwell Germany, RN as Oncology Nurse Navigator  DIAGNOSIS:    ICD-10-CM   1. 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 ER 50%, PR 30%, Ki-67 50%, HER-2 negative ratio 1.03   03/04/2019 Cancer Staging   Staging form: Breast, AJCC 8th Edition - Clinical stage from 03/04/2019: Stage IIIB (cT4, cN1, cM0, G3, ER+, PR+, HER2-) - Signed by GNicholas Lose MD on 03/04/2019    Neo-Adjuvant Chemotherapy   Adriamycin and Cytoxan x 4 given every three weeks without Neulasta support., followed by weekly Taxol x 12 versus Taxotere Cytoxan after her delivery   04/15/2019 Genetic Testing   Negative genetic testing:  No pathogenic variants detected on the [employer] Breast Cancer Guidelines-Based Panel or the Common Hereditary Cancers Panel. The report date is 04/15/2019.  The Breast Cancer Guidelines-Based panel offered by [employer] includes sequencing and rearrangement analysis for the following 11 genes:  ATM, BRCA1, BRCA2, CDH1, CHEK2, NBN, NF1, PALB2, PTEN, STK11 and TP53.  The Common Hereditary Cancers Panel offered by [facility] includes sequencing and/or deletion duplication testing of the following 48 genes: APC, ATM, AXIN2, BARD1, BMPR1A, BRCA1, BRCA2, BRIP1, CDH1, CDK4, CDKN2A (p14ARF), CDKN2A (p16INK4a),  CHEK2, CTNNA1, DICER1, EPCAM (Deletion/duplication testing only), GREM1 (promoter region deletion/duplication testing only), KIT, MEN1, MLH1, MSH2, MSH3, MSH6, MUTYH, NBN, NF1, NHTL1, PALB2, PDGFRA, PMS2, POLD1, POLE, PTEN, RAD50, RAD51C, RAD51D, RNF43, SDHB, SDHC, SDHD, SMAD4, SMARCA4. STK11, TP53, TSC1, TSC2, and VHL.  The following genes were evaluated for sequence changes only: SDHA and HOXB13 c.251G>A variant only.      CHIEF COMPLIANT: Cycle1 Taxol  INTERVAL HISTORY: Melissa Warmackis a 29y.o. with above-mentioned history of left breast cancer who completed 4 cycles of neoadjuvant chemotherapy with dose dense Adriamycin and Cytoxan. She delivered her baby girl on 06/19/19. She presented to the ED on 06/27/19 for lower extremity edema and shortness of breath. CT was negative for PE and she was discharged with 5 days of Lasix '20mg'$ . Echo on 07/02/19 showed a severely decreased ejection fraction, <20%. She presents to the clinic todayfor follow-up and cycle 1 of Taxol.   ALLERGIES:  has No Known Allergies.  MEDICATIONS:  Current Outpatient Medications  Medication Sig Dispense Refill  . acetaminophen (TYLENOL) 500 MG tablet Take 500 mg by mouth every 6 (six) hours as needed.      . furosemide (LASIX) 20 MG tablet Take 1 tablet (20 mg total) by mouth 2 (two) times daily. 60 tablet 1  . lidocaine-prilocaine (EMLA) cream APPLY 1 APPLICATION TOPICALLY ONCE FOR 1 DOSE.    .Marland Kitchenpotassium chloride (KLOR-CON) 10 MEQ tablet Take 1 tablet (10 mEq total) by mouth daily. 30 tablet 0   No current facility-administered medications for this visit.   Facility-Administered Medications Ordered in Other Visits  Medication Dose Route Frequency Provider Last Rate Last Admin  . famotidine (  PEPCID) IVPB 20 mg premix  20 mg Intravenous Once Melissa Lose, MD 200 mL/hr at 07/03/19 0958 20 mg at 07/03/19 0958  . heparin lock flush 100 unit/mL  500 Units Intracatheter Once PRN Melissa Lose, MD      . metoprolol tartrate  (LOPRESSOR) tablet 25 mg  25 mg Oral Once Melissa Lose, MD      . PACLitaxel (TAXOL) 162 mg in sodium chloride 0.9 % 250 mL chemo infusion (</= '80mg'$ /m2)  80 mg/m2 (Treatment Plan Recorded) Intravenous Once Melissa Lose, MD      . sodium chloride flush (NS) 0.9 % injection 10 mL  10 mL Intracatheter PRN Melissa Lose, MD        PHYSICAL EXAMINATION: ECOG PERFORMANCE STATUS: 1 - Symptomatic but completely ambulatory  Vitals:   07/03/19 0840  BP: (!) 124/108  Pulse: (!) 121  Resp: 20  Temp: 98 F (36.7 C)  SpO2: 100%   There were no vitals filed for this visit.  LABORATORY DATA:  I have reviewed the data as listed CMP Latest Ref Rng & Units 07/03/2019 05/28/2019 05/07/2019  Glucose 70 - 99 mg/dL 105(H) 125(H) 136(H)  BUN 6 - 20 mg/dL 15 6 5(L)  Creatinine 0.44 - 1.00 mg/dL 1.02(H) 0.61 0.60  Sodium 135 - 145 mmol/L 141 138 139  Potassium 3.5 - 5.1 mmol/L 3.6 3.1(L) 3.0(LL)  Chloride 98 - 111 mmol/L 107 107 107  CO2 22 - 32 mmol/L 24 21(L) 23  Calcium 8.9 - 10.3 mg/dL 8.4(L) 8.1(L) 8.2(L)  Total Protein 6.5 - 8.1 g/dL 6.2(L) 6.0(L) 6.0(L)  Total Bilirubin 0.3 - 1.2 mg/dL 0.6 0.4 0.4  Alkaline Phos 38 - 126 U/L 85 105 99  AST 15 - 41 U/L '27 15 24  '$ ALT 0 - 44 U/L '24 10 22    '$ Lab Results  Component Value Date   WBC 5.7 07/03/2019   HGB 12.5 07/03/2019   HCT 39.5 07/03/2019   MCV 95.4 07/03/2019   PLT 291 07/03/2019   NEUTROABS 3.0 07/03/2019    ASSESSMENT & PLAN:  Malignant neoplasm of overlapping sites of left breast in female, estrogen receptor positive ([place]) 02/26/2019:Pregnant lady with 63-monthhistory of left breast swelling and skin thickening, mammogram showed pleomorphic calcifications 8 cm, axilla lymph node biopsy positive ER 60%, PR 60%, Ki-67 50%, HER-2 negative ratio 1.25, anterior and posterior end of the calcifications biopsied: 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 Neoadjuvant chemotherapy with dose dense Adriamycin and Cytoxan given every 3 weeks without Neulasta support given her pregnancy completed 05/28/2019 2. After delivery, weekly Taxol x12 starting 07/03/2019 3. Followed by mastectomy and targeted node dissection versus full axillary lymph node dissection depending on the response. 4. Adjuvant radiation therapy 5. Followed by adjuvant antiestrogen therapy with complete estrogen blockade (ovarian suppression with AI) ----------------------------------------------------------------------------------------------------------------------------------------------------- Echo completed on 03/16/2018: EF 60-65% Breast MRI cannot be performed in the second trimester.  CT CAP and bone scan will need to be done  Current treatment: Cycle 1 Taxol Chemo toxicities: 1.Chemo-induced cardiomyopathy: Patient presented with shortness of breath post partum and echocardiogram revealed an EF of 15%.  Patient is seeing cardiology today.  Since she started Lasix she is feeling slightly better.  I gave her a new prescription for Lasix of 20 mg twice a day.  She is  also taking oral potassium supplementation. We will give her a dose of her metoprolol in the infusion area for her heart rate of 120. We discussed the pros and cons of initiating Taxol and given the clinical improvement we decided to proceed with treatment today. 2.  Chemo induced anemia  Return to clinic in 1 week for cycle 2 Taxol.    No orders of the defined types were placed in this encounter.  The patient has a good understanding of the overall plan. she agrees with it. she will call with any problems that may develop before the next visit here.  Total time spent: 30 mins including face to face time and time spent for planning, charting and coordination of care  Melissa Lose,  MD 07/03/2019  I, Cloyde Reams Dorshimer, am acting as scribe for Dr. Nicholas [name].  I have reviewed the above documentation for accuracy and completeness, and I agree with the above.

## 2019-07-03 ENCOUNTER — Other Ambulatory Visit: Payer: Self-pay | Admitting: Medical

## 2019-07-03 ENCOUNTER — Inpatient Hospital Stay: Payer: Medicaid Other

## 2019-07-03 ENCOUNTER — Ambulatory Visit: Payer: Medicaid Other | Admitting: Hematology and Oncology

## 2019-07-03 ENCOUNTER — Inpatient Hospital Stay: Payer: Medicaid Other | Attending: Hematology and Oncology

## 2019-07-03 ENCOUNTER — Encounter: Payer: Self-pay | Admitting: Hematology and Oncology

## 2019-07-03 ENCOUNTER — Other Ambulatory Visit: Payer: Self-pay

## 2019-07-03 ENCOUNTER — Inpatient Hospital Stay (HOSPITAL_BASED_OUTPATIENT_CLINIC_OR_DEPARTMENT_OTHER): Payer: Medicaid Other | Admitting: Medical

## 2019-07-03 ENCOUNTER — Inpatient Hospital Stay: Payer: Medicaid Other | Admitting: Hematology and Oncology

## 2019-07-03 ENCOUNTER — Encounter: Payer: Self-pay | Admitting: *Deleted

## 2019-07-03 ENCOUNTER — Inpatient Hospital Stay (HOSPITAL_BASED_OUTPATIENT_CLINIC_OR_DEPARTMENT_OTHER): Payer: Medicaid Other | Admitting: Hematology and Oncology

## 2019-07-03 VITALS — BP 121/100 | HR 106 | Temp 98.5°F | Resp 20

## 2019-07-03 DIAGNOSIS — C50812 Malignant neoplasm of overlapping sites of left female breast: Secondary | ICD-10-CM | POA: Insufficient documentation

## 2019-07-03 DIAGNOSIS — Z170 Estrogen receptor positive status [ER+]: Secondary | ICD-10-CM

## 2019-07-03 DIAGNOSIS — Z5111 Encounter for antineoplastic chemotherapy: Secondary | ICD-10-CM | POA: Diagnosis not present

## 2019-07-03 DIAGNOSIS — R600 Localized edema: Secondary | ICD-10-CM | POA: Diagnosis not present

## 2019-07-03 DIAGNOSIS — R000 Tachycardia, unspecified: Secondary | ICD-10-CM

## 2019-07-03 DIAGNOSIS — T451X5A Adverse effect of antineoplastic and immunosuppressive drugs, initial encounter: Secondary | ICD-10-CM | POA: Insufficient documentation

## 2019-07-03 DIAGNOSIS — D6481 Anemia due to antineoplastic chemotherapy: Secondary | ICD-10-CM | POA: Insufficient documentation

## 2019-07-03 DIAGNOSIS — R0602 Shortness of breath: Secondary | ICD-10-CM | POA: Diagnosis not present

## 2019-07-03 DIAGNOSIS — R109 Unspecified abdominal pain: Secondary | ICD-10-CM

## 2019-07-03 DIAGNOSIS — Z79899 Other long term (current) drug therapy: Secondary | ICD-10-CM | POA: Diagnosis not present

## 2019-07-03 DIAGNOSIS — Z95828 Presence of other vascular implants and grafts: Secondary | ICD-10-CM

## 2019-07-03 LAB — CBC WITH DIFFERENTIAL (CANCER CENTER ONLY)
Abs Immature Granulocytes: 0.04 10*3/uL (ref 0.00–0.07)
Basophils Absolute: 0.1 10*3/uL (ref 0.0–0.1)
Basophils Relative: 1 %
Eosinophils Absolute: 0.2 10*3/uL (ref 0.0–0.5)
Eosinophils Relative: 3 %
HCT: 39.5 % (ref 36.0–46.0)
Hemoglobin: 12.5 g/dL (ref 12.0–15.0)
Immature Granulocytes: 1 %
Lymphocytes Relative: 30 %
Lymphs Abs: 1.7 10*3/uL (ref 0.7–4.0)
MCH: 30.2 pg (ref 26.0–34.0)
MCHC: 31.6 g/dL (ref 30.0–36.0)
MCV: 95.4 fL (ref 80.0–100.0)
Monocytes Absolute: 0.7 10*3/uL (ref 0.1–1.0)
Monocytes Relative: 12 %
Neutro Abs: 3 10*3/uL (ref 1.7–7.7)
Neutrophils Relative %: 53 %
Platelet Count: 291 10*3/uL (ref 150–400)
RBC: 4.14 MIL/uL (ref 3.87–5.11)
RDW: 15.8 % — ABNORMAL HIGH (ref 11.5–15.5)
WBC Count: 5.7 10*3/uL (ref 4.0–10.5)
nRBC: 0 % (ref 0.0–0.2)

## 2019-07-03 LAB — CMP (CANCER CENTER ONLY)
ALT: 24 U/L (ref 0–44)
AST: 27 U/L (ref 15–41)
Albumin: 2.9 g/dL — ABNORMAL LOW (ref 3.5–5.0)
Alkaline Phosphatase: 85 U/L (ref 38–126)
Anion gap: 10 (ref 5–15)
BUN: 15 mg/dL (ref 6–20)
CO2: 24 mmol/L (ref 22–32)
Calcium: 8.4 mg/dL — ABNORMAL LOW (ref 8.9–10.3)
Chloride: 107 mmol/L (ref 98–111)
Creatinine: 1.02 mg/dL — ABNORMAL HIGH (ref 0.44–1.00)
GFR, Est AFR Am: >60 mL/min (ref >60)
GFR, Estimated: >60 mL/min (ref >60)
Glucose, Bld: 105 mg/dL — ABNORMAL HIGH (ref 70–99)
Potassium: 3.6 mmol/L (ref 3.5–5.1)
Sodium: 141 mmol/L (ref 135–145)
Total Bilirubin: 0.6 mg/dL (ref 0.3–1.2)
Total Protein: 6.2 g/dL — ABNORMAL LOW (ref 6.5–8.1)

## 2019-07-03 MED ORDER — DIPHENHYDRAMINE HCL 50 MG/ML IJ SOLN
INTRAMUSCULAR | Status: AC
  Filled 2019-07-03: qty 1

## 2019-07-03 MED ORDER — ATROPINE SULFATE 1 MG/ML IJ SOLN
INTRAMUSCULAR | Status: AC
  Filled 2019-07-03: qty 1

## 2019-07-03 MED ORDER — METOPROLOL TARTRATE 25 MG PO TABS
25.0000 mg | ORAL_TABLET | Freq: Once | ORAL | Status: AC
  Administered 2019-07-03: 25 mg via ORAL
  Filled 2019-07-03: qty 1

## 2019-07-03 MED ORDER — SODIUM CHLORIDE 0.9 % IV SOLN
80.0000 mg/m2 | Freq: Once | INTRAVENOUS | Status: AC
  Administered 2019-07-03: 11:00:00 162 mg via INTRAVENOUS
  Filled 2019-07-03: qty 27

## 2019-07-03 MED ORDER — FAMOTIDINE IN NACL 20-0.9 MG/50ML-% IV SOLN
INTRAVENOUS | Status: AC
  Filled 2019-07-03: qty 50

## 2019-07-03 MED ORDER — HEPARIN SOD (PORK) LOCK FLUSH 100 UNIT/ML IV SOLN
500.0000 [IU] | Freq: Once | INTRAVENOUS | Status: AC | PRN
  Administered 2019-07-03: 500 [IU]
  Filled 2019-07-03: qty 5

## 2019-07-03 MED ORDER — ALUM & MAG HYDROXIDE-SIMETH 200-200-20 MG/5ML PO SUSP
ORAL | Status: AC
  Filled 2019-07-03: qty 30

## 2019-07-03 MED ORDER — FUROSEMIDE 20 MG PO TABS
20.0000 mg | ORAL_TABLET | Freq: Two times a day (BID) | ORAL | 1 refills | Status: DC
Start: 2019-07-03 — End: 2019-07-06

## 2019-07-03 MED ORDER — DIPHENHYDRAMINE HCL 50 MG/ML IJ SOLN
25.0000 mg | Freq: Once | INTRAMUSCULAR | Status: AC
  Administered 2019-07-03: 25 mg via INTRAVENOUS

## 2019-07-03 MED ORDER — ALUM & MAG HYDROXIDE-SIMETH 200-200-20 MG/5ML PO SUSP
30.0000 mL | Freq: Once | ORAL | Status: AC
  Administered 2019-07-03: 30 mL via ORAL

## 2019-07-03 MED ORDER — SODIUM CHLORIDE 0.9% FLUSH
10.0000 mL | INTRAVENOUS | Status: DC | PRN
  Administered 2019-07-03: 10 mL
  Filled 2019-07-03: qty 10

## 2019-07-03 MED ORDER — FAMOTIDINE IN NACL 20-0.9 MG/50ML-% IV SOLN
20.0000 mg | Freq: Once | INTRAVENOUS | Status: AC
  Administered 2019-07-03: 20 mg via INTRAVENOUS

## 2019-07-03 MED ORDER — ATROPINE SULFATE 1 MG/ML IJ SOLN
0.5000 mg | Freq: Once | INTRAMUSCULAR | Status: AC
  Administered 2019-07-03: 0.5 mg via INTRAVENOUS

## 2019-07-03 MED ORDER — SODIUM CHLORIDE 0.9% FLUSH
10.0000 mL | Freq: Once | INTRAVENOUS | Status: AC
  Administered 2019-07-03: 10 mL
  Filled 2019-07-03: qty 10

## 2019-07-03 MED ORDER — SODIUM CHLORIDE 0.9 % IV SOLN
Freq: Once | INTRAVENOUS | Status: AC
  Filled 2019-07-03: qty 250

## 2019-07-03 NOTE — Assessment & Plan Note (Signed)
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 Neoadjuvant chemotherapy with dose dense Adriamycin and Cytoxan given every 3 weeks without Neulasta support given her pregnancy completed 05/28/2019 2. After delivery, weekly Taxol x12 starting 07/03/2019 3. Followed by mastectomy and targeted node dissection versus full axillary lymph node dissection depending on the response. 4. Adjuvant radiation therapy 5. Followed by adjuvant antiestrogen therapy with complete estrogen blockade (ovarian suppression with AI) ----------------------------------------------------------------------------------------------------------------------------------------------------- Echo completed on 03/16/2018: EF 60-65% Breast MRI cannot be performed in the second trimester.  CT CAP and bone scan will be done  Current treatment: Cycle 1 Taxol Chemo toxicities: 1.Chemo-induced cardiomyopathy: Patient presented with shortness of breath post partum and echocardiogram revealed an EF of 15%.  Patient is seeing cardiology today. 2.  Chemo induced anemia  Return to clinic in 1 week for cycle 2 Taxol.

## 2019-07-03 NOTE — Progress Notes (Signed)
Okay to treat with HR and BP today per Dr. Lindi Adie -  Verbal orders for Metoprolol 25mg  X 1 orally.

## 2019-07-03 NOTE — Progress Notes (Signed)
After premeds completed patient c/o abdominal cramping.  Attempted to use BR but cramping continued.  Sandi Mealy PA-C to chairside.  Atropine 0.5 mg given and Maalox 16ml given.  Patient went to [facility] and had normal Bowel movement with some relief.    After exam and bowel movement proceeded with treatment and patient blood pressure dropped to 79/36, pulse of 121 respirations of 124, patient denies any light headedness or pain,Taxol stopped and Sandi Mealy PA-C returned to chair side.    At 1215 Patient describes breathing difficulty that resolved with O2 nasal cannula of 3 liters.  BP 71/59 Pulse 121   (see flowsheet for complete vital signs).  EKGN obtained.  Normal Sinus Tachycardia  After consulting with Dr Lindi Adie, Taxol will be held today.  Patient has consult with Dr Haroldine Laws on Monday regarding ECHO results.  Elevation of legs and low sodium diet/heart healthy diet prescribed.   At 1300 VSS at BP 119/96 pulse of 109 O2 sat 99%

## 2019-07-03 NOTE — Progress Notes (Signed)
The patient was seen in the infusion today prior to receiving cycle 1 of Taxol.  She developed significant abdominal pain which she described as cramping.  She had been to the bathroom and urinated but stated that she felt as though she needed to have a bowel movement.  She was given atropine 0.5 mg IV and PO Mylanta.  She was subsequently seen when she reported having palpitations.  Her pulse rate was approximately 135 and jumped to 165.  Her rhythm was stable.  An EKG was completed which showed sinus tachycardia.  There was no evidence of ischemic changes.  Her rate dropped to 117 and her blood pressure dropped to 79/36.  This was discussed with Dr. Lindi Adie who recommended holding her Taxol today.  He agreed to give the patient a small saline bolus of 250 mL over 30 minutes despite the fact that she has recently been found to have heart failure.  She is scheduled to see cardiology on Monday.  By the time I had returned to discuss this with the patient it was noted that her blood pressure had normalized at 119/96 with a pulse of 109.  Based on this, a bolus of saline was held.  The patient continued to be stable without any issues of concern and was released home.  Sandi Mealy, MHS, PA-C Physician Assistant

## 2019-07-03 NOTE — Progress Notes (Signed)
Called pt to assess needs and check in after delivery of daughter. No answer and vm not set up to leave a msg.

## 2019-07-03 NOTE — Patient Instructions (Signed)
Follow Hear Healthy Diet/Low Sodium Keep legs elevated above hips   Heart-Healthy Eating Plan Heart-healthy meal planning includes:  Eating less unhealthy fats.  Eating more healthy fats.  Making other changes in your diet. Talk with your doctor or a diet specialist (dietitian) to create an eating plan that is right for you. What is my plan? Your doctor may recommend an eating plan that includes:  Total fat: ______% or less of total calories a day.  Saturated fat: ______% or less of total calories a day.  Cholesterol: less than _________mg a day. What are tips for following this plan? Cooking Avoid frying your food. Try to bake, boil, grill, or broil it instead. You can also reduce fat by:  Removing the skin from poultry.  Removing all visible fats from meats.  Steaming vegetables in water or broth. Meal planning   At meals, divide your plate into four equal parts: ? Fill one-half of your plate with vegetables and green salads. ? Fill one-fourth of your plate with whole grains. ? Fill one-fourth of your plate with lean protein foods.  Eat 4-5 servings of vegetables per day. A serving of vegetables is: ? 1 cup of raw or cooked vegetables. ? 2 cups of raw leafy greens.  Eat 4-5 servings of fruit per day. A serving of fruit is: ? 1 medium whole fruit. ?  cup of dried fruit. ?  cup of fresh, frozen, or canned fruit. ?  cup of 100% fruit juice.  Eat more foods that have soluble fiber. These are apples, broccoli, carrots, beans, peas, and barley. Try to get 20-30 g of fiber per day.  Eat 4-5 servings of nuts, legumes, and seeds per week: ? 1 serving of dried beans or legumes equals  cup after being cooked. ? 1 serving of nuts is  cup. ? 1 serving of seeds equals 1 tablespoon. General information  Eat more home-cooked food. Eat less restaurant, buffet, and fast food.  Limit or avoid alcohol.  Limit foods that are high in starch and sugar.  Avoid fried  foods.  Lose weight if you are overweight.  Keep track of how much salt (sodium) you eat. This is important if you have high blood pressure. Ask your doctor to tell you more about this.  Try to add vegetarian meals each week. Fats  Choose healthy fats. These include olive oil and canola oil, flaxseeds, walnuts, almonds, and seeds.  Eat more omega-3 fats. These include salmon, mackerel, sardines, tuna, flaxseed oil, and ground flaxseeds. Try to eat fish at least 2 times each week.  Check food labels. Avoid foods with trans fats or high amounts of saturated fat.  Limit saturated fats. ? These are often found in animal products, such as meats, butter, and cream. ? These are also found in plant foods, such as palm oil, palm kernel oil, and coconut oil.  Avoid foods with partially hydrogenated oils in them. These have trans fats. Examples are stick margarine, some tub margarines, cookies, crackers, and other baked goods. What foods can I eat? Fruits All fresh, canned (in natural juice), or frozen fruits. Vegetables Fresh or frozen vegetables (raw, steamed, roasted, or grilled). Green salads. Grains Most grains. Choose whole wheat and whole grains most of the time. Rice and pasta, including brown rice and pastas made with whole wheat. Meats and other proteins Lean, well-trimmed beef, veal, pork, and lamb. Chicken and [place] without skin. All fish and shellfish. Wild duck, rabbit, pheasant, and venison. Egg whites or  low-cholesterol egg substitutes. Dried beans, peas, lentils, and tofu. Seeds and most nuts. Dairy Low-fat or nonfat cheeses, including ricotta and mozzarella. Skim or 1% milk that is liquid, powdered, or evaporated. Buttermilk that is made with low-fat milk. Nonfat or low-fat yogurt. Fats and oils Non-hydrogenated (trans-free) margarines. Vegetable oils, including soybean, sesame, sunflower, olive, peanut, safflower, corn, canola, and cottonseed. Salad dressings or mayonnaise  made with a vegetable oil. Beverages Mineral water. Coffee and tea. Diet carbonated beverages. Sweets and desserts Sherbet, gelatin, and fruit ice. Small amounts of dark chocolate. Limit all sweets and desserts. Seasonings and condiments All seasonings and condiments. The items listed above may not be a complete list of foods and drinks you can eat. Contact a dietitian for more options. What foods should I avoid? Fruits Canned fruit in heavy syrup. Fruit in cream or butter sauce. Fried fruit. Limit coconut. Vegetables Vegetables cooked in cheese, cream, or butter sauce. Fried vegetables. Grains Breads that are made with saturated or trans fats, oils, or whole milk. Croissants. Sweet rolls. Donuts. High-fat crackers, such as cheese crackers. Meats and other proteins Fatty meats, such as hot dogs, ribs, sausage, bacon, rib-eye roast or steak. High-fat deli meats, such as salami and bologna. Caviar. Domestic duck and goose. Organ meats, such as liver. Dairy Cream, sour cream, cream cheese, and creamed cottage cheese. Whole-milk cheeses. Whole or 2% milk that is liquid, evaporated, or condensed. Whole buttermilk. Cream sauce or high-fat cheese sauce. Yogurt that is made from whole milk. Fats and oils Meat fat, or shortening. Cocoa butter, hydrogenated oils, palm oil, coconut oil, palm kernel oil. Solid fats and shortenings, including bacon fat, salt pork, lard, and butter. Nondairy cream substitutes. Salad dressings with cheese or sour cream. Beverages Regular sodas and juice drinks with added sugar. Sweets and desserts Frosting. Pudding. Cookies. Cakes. Pies. Milk chocolate or white chocolate. Buttered syrups. Full-fat ice cream or ice cream drinks. The items listed above may not be a complete list of foods and drinks to avoid. Contact a dietitian for more information. Summary  Heart-healthy meal planning includes eating less unhealthy fats, eating more healthy fats, and making other  changes in your diet.  Eat a balanced diet. This includes fruits and vegetables, low-fat or nonfat dairy, lean protein, nuts and legumes, whole grains, and heart-healthy oils and fats. This information is not intended to replace advice given to you by your health care provider. Make sure you discuss any questions you have with your health care provider. Document Revised: 05/02/2017 Document Reviewed: 04/05/2017 Elsevier Patient Education  2020 [employer].

## 2019-07-06 ENCOUNTER — Encounter (HOSPITAL_COMMUNITY): Payer: Self-pay | Admitting: Internal Medicine

## 2019-07-06 ENCOUNTER — Ambulatory Visit (HOSPITAL_COMMUNITY)
Admission: RE | Admit: 2019-07-06 | Discharge: 2019-07-06 | Disposition: A | Discharge status: Home / Self Care | Payer: Medicaid Other | Source: Ambulatory Visit | Attending: Internal Medicine | Admitting: Internal Medicine

## 2019-07-06 ENCOUNTER — Encounter: Payer: Self-pay | Admitting: *Deleted

## 2019-07-06 ENCOUNTER — Other Ambulatory Visit: Payer: Self-pay

## 2019-07-06 VITALS — BP 132/100 | HR 116 | Wt 205.0 lb

## 2019-07-06 DIAGNOSIS — Z79899 Other long term (current) drug therapy: Secondary | ICD-10-CM | POA: Insufficient documentation

## 2019-07-06 DIAGNOSIS — Z803 Family history of malignant neoplasm of breast: Secondary | ICD-10-CM | POA: Diagnosis not present

## 2019-07-06 DIAGNOSIS — I429 Cardiomyopathy, unspecified: Secondary | ICD-10-CM | POA: Insufficient documentation

## 2019-07-06 DIAGNOSIS — I110 Hypertensive heart disease with heart failure: Secondary | ICD-10-CM | POA: Diagnosis not present

## 2019-07-06 DIAGNOSIS — Z8249 Family history of ischemic heart disease and other diseases of the circulatory system: Secondary | ICD-10-CM | POA: Insufficient documentation

## 2019-07-06 DIAGNOSIS — Z800 Family history of malignant neoplasm of digestive organs: Secondary | ICD-10-CM | POA: Diagnosis not present

## 2019-07-06 DIAGNOSIS — Z9221 Personal history of antineoplastic chemotherapy: Secondary | ICD-10-CM | POA: Diagnosis not present

## 2019-07-06 DIAGNOSIS — I10 Essential (primary) hypertension: Secondary | ICD-10-CM | POA: Diagnosis not present

## 2019-07-06 DIAGNOSIS — Z823 Family history of stroke: Secondary | ICD-10-CM | POA: Insufficient documentation

## 2019-07-06 DIAGNOSIS — Z170 Estrogen receptor positive status [ER+]: Secondary | ICD-10-CM | POA: Diagnosis not present

## 2019-07-06 DIAGNOSIS — E282 Polycystic ovarian syndrome: Secondary | ICD-10-CM

## 2019-07-06 DIAGNOSIS — C50812 Malignant neoplasm of overlapping sites of left female breast: Secondary | ICD-10-CM | POA: Insufficient documentation

## 2019-07-06 DIAGNOSIS — Z87891 Personal history of nicotine dependence: Secondary | ICD-10-CM | POA: Diagnosis not present

## 2019-07-06 DIAGNOSIS — I5021 Acute systolic (congestive) heart failure: Secondary | ICD-10-CM | POA: Insufficient documentation

## 2019-07-06 LAB — COMPREHENSIVE METABOLIC PANEL
ALT: 98 U/L — ABNORMAL HIGH (ref 0–44)
AST: 72 U/L — ABNORMAL HIGH (ref 15–41)
Albumin: 2.9 g/dL — ABNORMAL LOW (ref 3.5–5.0)
Alkaline Phosphatase: 79 U/L (ref 38–126)
Anion gap: 14 (ref 5–15)
BUN: 14 mg/dL (ref 6–20)
CO2: 19 mmol/L — ABNORMAL LOW (ref 22–32)
Calcium: 8.2 mg/dL — ABNORMAL LOW (ref 8.9–10.3)
Chloride: 104 mmol/L (ref 98–111)
Creatinine, Ser: 1.21 mg/dL — ABNORMAL HIGH (ref 0.44–1.00)
GFR calc Af Amer: >60 mL/min (ref >60)
GFR calc non Af Amer: 60 mL/min — ABNORMAL LOW (ref >60)
Glucose, Bld: 119 mg/dL — ABNORMAL HIGH (ref 70–99)
Potassium: 3.7 mmol/L (ref 3.5–5.1)
Sodium: 137 mmol/L (ref 135–145)
Total Bilirubin: 0.6 mg/dL (ref 0.3–1.2)
Total Protein: 6.1 g/dL — ABNORMAL LOW (ref 6.5–8.1)

## 2019-07-06 LAB — CBC
HCT: 40.1 % (ref 36.0–46.0)
Hemoglobin: 12.4 g/dL (ref 12.0–15.0)
MCH: 30 pg (ref 26.0–34.0)
MCHC: 30.9 g/dL (ref 30.0–36.0)
MCV: 97.1 fL (ref 80.0–100.0)
Platelets: 219 10*3/uL (ref 150–400)
RBC: 4.13 MIL/uL (ref 3.87–5.11)
RDW: 15.3 % (ref 11.5–15.5)
WBC: 7.1 10*3/uL (ref 4.0–10.5)
nRBC: 0.7 % — ABNORMAL HIGH (ref 0.0–0.2)

## 2019-07-06 LAB — BRAIN NATRIURETIC PEPTIDE: B Natriuretic Peptide: 2025 pg/mL — ABNORMAL HIGH (ref 0.0–100.0)

## 2019-07-06 MED ORDER — DIGOXIN 125 MCG PO TABS
125.0000 ug | ORAL_TABLET | Freq: Every day | ORAL | 11 refills | Status: DC
Start: 2019-07-06 — End: 2019-09-17

## 2019-07-06 MED ORDER — ENTRESTO 49-51 MG PO TABS: 1.0000 | ORAL_TABLET | Freq: Two times a day (BID) | ORAL | 3 refills | Status: DC

## 2019-07-06 MED ORDER — FUROSEMIDE 20 MG PO TABS
20.0000 mg | ORAL_TABLET | Freq: Every day | ORAL | 1 refills | Status: DC
Start: 2019-07-06 — End: 2019-09-17

## 2019-07-06 NOTE — Patient Instructions (Signed)
DECREASE Lasix to 20mg  daily. You may take an additional 20mg  daily as needed.  START Entresto 49/51mg  twice daily.  START Digoxin 0.125mg  daily.  Routine lab work today. Will notify you of abnormal results  Follow up with pharmacy every two weeks for three visits.  Follow up and echo with Dr.Bensimhon in 2-3 months.

## 2019-07-06 NOTE — Progress Notes (Signed)
.  Pharmacist Chemotherapy Monitoring - Follow Up Assessment    I verify that I have reviewed each item in the below checklist:  . Regimen for the patient is scheduled for the appropriate day and plan matches scheduled date. Marland Kitchen Appropriate non-routine labs are ordered dependent on drug ordered. . If applicable, additional medications reviewed and ordered per protocol based on lifetime cumulative doses and/or treatment regimen.   Plan for follow-up and/or issues identified: Yes . I-vent associated with next due treatment: Yes . MD and/or nursing notified: Yes  . Please see [facility] and notes from 4/23 as Taxol was not given due to blood pressure/heart rate issues.  Wynona Neat 07/06/2019 1:47 PM

## 2019-07-06 NOTE — Progress Notes (Addendum)
CARDIO-ONCOLOGY CLINIC CONSULT NOTE  Referring Physician: Lindi Adie Primary Cardiologist: New  HPI:  Melissa [name] is 29 y.o. female with left breast cancer referred by Dr. Lindi Adie for enrollment into the Cardio-Oncology program due to new-onset cardiomyopathy.   Malignant neoplasm of overlapping sites of left breast in female, estrogen receptor positive ([place])  02/26/2019 Initial Diagnosis   Pregnant lady with 25-monthhistory of left breast swelling and skin thickening, mammogram showed pleomorphic calcifications 8 cm, axilla lymph node biopsy positive ER 60%, PR 60%, Ki-67 50%, HER-2 negative ratio 1.25, anterior and posterior end of the calcifications biopsied: 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 ER 50%, PR 30%, Ki-67 50%, HER-2 negative ratio 1.03   03/04/2019 Cancer Staging   Staging form: Breast, AJCC 8th Edition - Clinical stage from 03/04/2019: Stage IIIB (cT4, cN1, cM0, G3, ER+, PR+, HER2-) - Signed by GNicholas Lose MD on 03/04/2019    Neo-Adjuvant Chemotherapy   Adriamycin and Cytoxan x 4 given every three weeks without Neulasta support., followed by weekly Taxol x 12 versus Taxotere Cytoxan after her delivery   04/15/2019 Genetic Testing   Negative genetic testing:  No pathogenic variants detected on the [employer] Breast Cancer Guidelines-Based Panel or the Common Hereditary Cancers Panel. The report date is 04/15/2019.  The BreastCancerGuidelines-Basedpanel offered by [employer] includes sequencing and rearrangement analysis for the following 11genes: ATM, BRCA1, BRCA2, CDH1, CHEK2,NBN, NF1,PALB2, PTEN, STK11 and TP53.The Common Hereditary Cancers Panel offered by [employer] includes sequencing and/or deletion duplication testing of the following 48 genes: APC, ATM, AXIN2, BARD1, BMPR1A, BRCA1, BRCA2, BRIP1, CDH1, CDK4, CDKN2A (p14ARF), CDKN2A (p16INK4a), CHEK2, CTNNA1, DICER1, EPCAM (Deletion/duplication testing only), GREM1 (promoter region deletion/duplication  testing only), KIT, MEN1, MLH1, MSH2, MSH3, MSH6, MUTYH, NBN, NF1, NHTL1, PALB2, PDGFRA, PMS2, POLD1, POLE, PTEN, RAD50, RAD51C, RAD51D, RNF43, SDHB, SDHC, SDHD, SMAD4, SMARCA4. STK11, TP53, TSC1, TSC2, and VHL.  The following genes were evaluated for sequence changes only: SDHA and HOXB13 c.251G>A variant only.     HPI:  She denies any h/o HTN or heart problems. Diagnosed with left breast CA in 12/20 when she was 4 months pregnant with her second child. Her first daughter was born at 349 weekin Nov 2019. No other complications except pre-term labor.  Echo 03/17/19: EF 60% RV normal    While pregnant she was treated with 4 cycles of neoadjuvant chemotherapy with dose dense Adriamycin and Cytoxan. She delivered her baby girl on 06/19/19. She did not have problems with HTN or pre-eclampsia.   She presented to the ED on 06/27/19 for lower extremity edema and shortness of breath. CT was negative for PE and she was discharged with 5 days of Lasix '20mg'$ . Echo on 07/02/19 showed a severely decreased ejection fraction, <20%. She   ECHO 07/02/19 EF 15-20%. RV moderately reduced  Reviewed personally.  Remains on lasix '20mg'$  bid. Weight down 220 -> 205. Feels much better. Can do ADLs without much problem. Still SOB with going up steps. Orthopnea and PND have resolved. Mild ankle edema.    Review of Systems: [y] = yes, '[ ]'$  = no   General: Weight gain '[ ]'$ ; Weight loss '[ ]'$ ; Anorexia '[ ]'$ ; Fatigue '[ ]'$ ; Fever '[ ]'$ ; Chills '[ ]'$ ; Weakness '[ ]'$   Cardiac: Chest pain/pressure '[ ]'$ ; Resting SOB '[ ]'$ ; Exertional SOB '[ ]'$ ; Orthopnea '[ ]'$ ; Pedal Edema '[ ]'$ ; Palpitations '[ ]'$ ; Syncope '[ ]'$ ; Presyncope '[ ]'$ ; Paroxysmal nocturnal dyspnea'[ ]'$   Pulmonary: Cough '[ ]'$ ; Wheezing'[ ]'$ ; Hemoptysis'[ ]'$ ; Sputum '[ ]'$ ;  Snoring '[ ]'$   GI: Vomiting'[ ]'$ ; Dysphagia'[ ]'$ ; Melena'[ ]'$ ; Hematochezia '[ ]'$ ; Heartburn'[ ]'$ ; Abdominal pain '[ ]'$ ; Constipation '[ ]'$ ; Diarrhea '[ ]'$ ; BRBPR '[ ]'$   GU: Hematuria'[ ]'$ ; Dysuria '[ ]'$ ; Nocturia'[ ]'$   Vascular: Pain in legs with walking '[ ]'$ ; Pain  in feet with lying flat '[ ]'$ ; Non-healing sores '[ ]'$ ; Stroke '[ ]'$ ; TIA '[ ]'$ ; Slurred speech '[ ]'$ ;  Neuro: Headaches'[ ]'$ ; Vertigo'[ ]'$ ; Seizures'[ ]'$ ; Paresthesias'[ ]'$ ;Blurred vision '[ ]'$ ; Diplopia '[ ]'$ ; Vision changes '[ ]'$   Ortho/Skin: Arthritis '[ ]'$ ; Joint pain '[ ]'$ ; Muscle pain '[ ]'$ ; Joint swelling '[ ]'$ ; Back Pain '[ ]'$ ; Rash '[ ]'$   Psych: Depression'[ ]'$ ; Anxiety'[ ]'$   Heme: Bleeding problems '[ ]'$ ; Clotting disorders '[ ]'$ ; Anemia '[ ]'$   Endocrine: Diabetes '[ ]'$ ; Thyroid dysfunction'[ ]'$    Past Medical History:  Diagnosis Date  . Abnormal Pap smear 07/13/11   ASCUS +HPV  . Cancer [facility])    breast cancer  . Family history of breast cancer   . Family history of colon cancer   . Family history of throat cancer   . 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 acetaminophen (TYLENOL) 500 MG tablet Take 500 mg by mouth every 6 (six) hours as needed.      . furosemide (LASIX) 20 MG tablet Take 1 tablet (20 mg total) by mouth 2 (two) times daily. 60 tablet 1  . lidocaine-prilocaine (EMLA) cream APPLY 1 APPLICATION TOPICALLY ONCE FOR 1 DOSE.    Marland Kitchen potassium chloride (KLOR-CON) 10 MEQ tablet Take 1 tablet (10 mEq total) by mouth daily. 30 tablet 0   No current facility-administered medications for this encounter.    No Known Allergies    Social History   Socioeconomic History  . Marital status: Single    Spouse name: Not on file  . Number of children: Not on file  . Years of education: Not on file  . Highest education level: Not on file  Occupational History  . Not on file  Tobacco Use  . Smoking status: Former Smoker    Types: Cigarettes    Quit date: 02/27/2019    Years since quitting: 0.3  . Smokeless tobacco: Never Used  Substance and Sexual Activity  . Alcohol use: Not Currently  . Drug use: Yes    Frequency: 2.0 times per week    Types: Marijuana    Comment: 03/14/2018  . Sexual activity: Yes    Birth  control/protection: None  Other Topics Concern  . Not on file  Social History Narrative  . Not on file   Social Determinants of Health   Financial Resource Strain:   . Difficulty of Paying Living Expenses:   Food Insecurity:   . Worried About Charity fundraiser in the Last Year:   . Arboriculturist in the Last Year:   Transportation Needs:   . Film/video editor (Medical):   Marland Kitchen Lack of Transportation (Non-Medical):   Physical Activity:   . Days of Exercise per Week:   . Minutes of Exercise per Session:   Stress:   . Feeling of Stress :   Social Connections:   . Frequency of Communication with Friends and Family:   . Frequency of Social Gatherings with Friends and Family:   . Attends Religious Services:   . Active Member of Clubs or Organizations:   .  Attends Archivist Meetings:   Marland Kitchen Marital Status:   Intimate Partner Violence:   . Fear of Current or Ex-Partner:   . Emotionally Abused:   Marland Kitchen Physically Abused:   . Sexually Abused:       Family History  Problem Relation Age of Onset  . Breast cancer Paternal Grandmother        dx. 51s or younger  . Stroke Father 22  . Hypertension Father   . Cancer Maternal Aunt        unknown type, dx. late 1s  . Colon cancer Paternal Uncle        dx 95s  . Throat cancer Maternal Grandmother        dx. 71s  . Cancer Paternal Uncle        unknown type, dx. 38s  . Breast cancer Cousin        dx. 21s, paternal 1st cousin    Vitals:   07/06/19 1438  BP: (!) 132/100  Pulse: (!) 116  SpO2: 97%  Weight: 93 kg (205 lb)    PHYSICAL EXAM: General:  Well appearing. No respiratory difficulty HEENT: normal Neck: supple. no JVD. Carotids 2+ bilat; no bruits. No lymphadenopathy or thryomegaly appreciated. Cor: PMI nondisplaced. Tachy regular + s3 Lungs: clear Abdomen: soft, nontender, nondistended. No hepatosplenomegaly. No bruits or masses. Good bowel sounds. Extremities: no cyanosis, clubbing, rash, edema Neuro:  alert & oriented x 3, cranial nerves grossly intact. moves all 4 extremities w/o difficulty. Affect pleasant.  ECG: Sinus tach 117. Low volts. Prwp. QRS narrow Personally reviewed    ASSESSMENT & PLAN: 1. Acute systolic HF - EF 29-24% suspect peri-partum vs adriamycin toxicity (hard to know which one) - symptoms improved with diuresis but I am still very concerned for low output with prominent tachycardia and bicarb of 19 on labs - She is not breast feeding - Start Entresto 49/15 bid - Start digoxin 0.125daily - Decrease lasix to 20 daily 90fom 20 bid) with addition of Entresto. Can take extra as needed  - Add digoxin  - Will need to follow very closely. Arrange f/u in [facility] for f/u q2 weeks. Call if any deterioration.  - Discussed need for IUD or other forms of contraception  2. HTN - start Entresto  3. Breast CA - s/p treatment with taxol and adriamycin   DGlori Bickers MD  2:50 PM

## 2019-07-09 NOTE — Progress Notes (Signed)
Counseling Intern spoke to pt on 2021 for a telehealth counseling session via phone. Counseling intern provided space for pt to open up about experiences and responded with empathy, compassion, and normalization of feelings.   Pt reported that her baby is doing well, but that she has had a stressful few weeks with increased anxiety. She said she's feeling better for the past few days, but that she had a hard time during the first few weeks after the baby's birth. Pt shared that none of her family came to be with her when she went into labor and that it has caused her to feel hurt, sad, and angry. Pt feels that "everybody has turned their back" on her, has "let her down," and "nothing makes sense to her" right now.  Pt reflected that she feels isolated and alone with no one she can trust. Pt did go to the ER on 4/17 because of shortness of breath and fluid retention and ultimately said she went because of her fears that something serious was wrong. She was treated and released and glad to know she was going to be OK for the time being. Pt and CI discussed her fears about dying and leaving her children without a mother. CI also reminded pt that her body has been through a lot and that her post-pregnancy experience might be different from her past experience or from her friend's experiences. Pt shared that she attempted to start chemo last week, but it was stopped because of her inability to tolerate the chemo safely at that time. She said that she will try again to begin chemo tomorrow and hopes that her body is more prepared to tolerate the tx this time. Pt reported she continues to have difficulty dealing with her baby's father, but that she is not concerned for her safety at this time.   Pt has aspirations to be happier, less anxious, and to do "normal" things with her kids this summer like going out to a park or to the beach.  Pt reported she continues to rely on prayer, distraction, and positive redirection  to cope with distressing thoughts. She said she is grateful to have her baby to care for because she provides the pt a sense of purpose and a positive experience to focus on. Pt also recognized that she needs to take a list of questions to her doctor appointments to ensure she is getting her questions answered, but that she hopes to hear encouraging news each week. Ultimately, the pt tries to take things day to day and tries not to focus on negative events or thoughts.  CI provided space for the pt to process emotions related to recent experiences and encouraged the pt to make meaning from these events. CI used psychoeducation to discuss anxiety, resilience and coping strategies. CI informed the pt that this will be our last session together. She encouraged the pt to find a counselor in the community and emailed the patient some contact information for several [facility]. Pt reflected that opening up to a counselor was a "big step" for her, but that she is considering trying it again.   Art Buff [place] Counseling Intern Voicemail:  [phone]

## 2019-07-09 NOTE — Progress Notes (Signed)
Patient Care Team: Patient, [name] (General Practice) [name], RN as Oncology Nurse Navigator Rockwell Germany, RN as Oncology Nurse Navigator Rolm Bookbinder, MD as Surgeon (General Surgery) Nicholas Lose, MD as Medical Oncologist (Hematology and Oncology)  DIAGNOSIS:    ICD-10-CM   1. 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 ER 50%, PR 30%, Ki-67 50%, HER-2 negative ratio 1.03   03/04/2019 Cancer Staging   Staging form: Breast, AJCC 8th Edition - Clinical stage from 03/04/2019: Stage IIIB (cT4, cN1, cM0, G3, ER+, PR+, HER2-) - Signed by GNicholas Lose MD on 03/04/2019    Neo-Adjuvant Chemotherapy   Adriamycin and Cytoxan x 4 given every three weeks without Neulasta support., followed by weekly Taxol x 12 versus Taxotere Cytoxan after her delivery   04/15/2019 Genetic Testing   Negative genetic testing:  No pathogenic variants detected on the [employer] Breast Cancer Guidelines-Based Panel or the Common Hereditary Cancers Panel. The report date is 04/15/2019.  The Breast Cancer Guidelines-Based panel offered by [employer] includes sequencing and rearrangement analysis for the following 11 genes:  ATM, BRCA1, BRCA2, CDH1, CHEK2, NBN, NF1, PALB2, PTEN, STK11 and TP53.  The Common Hereditary Cancers Panel offered by [facility] includes sequencing and/or deletion duplication testing of the  following 48 genes: APC, ATM, AXIN2, BARD1, BMPR1A, BRCA1, BRCA2, BRIP1, CDH1, CDK4, CDKN2A (p14ARF), CDKN2A (p16INK4a), CHEK2, CTNNA1, DICER1, EPCAM (Deletion/duplication testing only), GREM1 (promoter region deletion/duplication testing only), KIT, MEN1, MLH1, MSH2, MSH3, MSH6, MUTYH, NBN, NF1, NHTL1, PALB2, PDGFRA, PMS2, POLD1, POLE, PTEN, RAD50, RAD51C, RAD51D, RNF43, SDHB, SDHC, SDHD, SMAD4, SMARCA4. STK11, TP53, TSC1, TSC2, and VHL.  The following genes were evaluated for sequence changes only: SDHA and HOXB13 c.251G>A variant only.      CHIEF COMPLIANT: Cycle2 Taxol  INTERVAL HISTORY: AKennedee Kitzmilleris a 29y.o. with above-mentioned history of left breast cancer who completed 4 cycles of neoadjuvant chemotherapy with dose dense Adriamycin and Cytoxan, delivered her baby, and is currently on weekly Taxol. She presents to the clinic todayfor follow-up and cycle 2 of Taxol.  With the first dose of Taxol she had severe tachycardia and we decided to stop the infusion.  She was seen by Dr. BHaroldine Lawswho adjusted her medications and prescribed her digoxin as well as Entresto and reduce her Lasix to once a day.  She appears to be doing significantly better today.  ALLERGIES:  has No Known Allergies.  MEDICATIONS:  Current Outpatient Medications  Medication Sig Dispense Refill  . acetaminophen (TYLENOL) 500 MG tablet Take 500 mg by mouth every 6 (six) hours as needed.      . digoxin (LANOXIN) 0.125 MG tablet Take 1 tablet (125 mcg total) by mouth daily. 30 tablet 11  . furosemide (LASIX) 20 MG tablet Take 1 tablet (20 mg total) by mouth daily. May take an additional tablet daily as needed. 60 tablet 1  . lidocaine-prilocaine (EMLA) cream APPLY 1 APPLICATION TOPICALLY ONCE  FOR 1 DOSE.    Marland Kitchen potassium chloride (KLOR-CON) 10 MEQ tablet Take 1 tablet (10 mEq total) by mouth daily. 30 tablet 0  . sacubitril-valsartan (ENTRESTO) 49-51 MG Take 1 tablet by mouth 2 (two) times daily. 60 tablet 3   No  current facility-administered medications for this visit.    PHYSICAL EXAMINATION: ECOG PERFORMANCE STATUS: 1 - Symptomatic but completely ambulatory  Vitals:   07/10/19 0848  BP: (!) 124/92  Pulse: (!) 111  Resp: 17  Temp: 98.3 F (36.8 C)  SpO2: 100%   Filed Weights   07/10/19 0848  Weight: 201 lb 8 oz (91.4 kg)    LABORATORY DATA:  I have reviewed the data as listed CMP Latest Ref Rng & Units 07/06/2019 07/03/2019 05/28/2019  Glucose 70 - 99 mg/dL 119(H) 105(H) 125(H)  BUN 6 - 20 mg/dL '14 15 6  '$ Creatinine 0.44 - 1.00 mg/dL 1.21(H) 1.02(H) 0.61  Sodium 135 - 145 mmol/L 137 141 138  Potassium 3.5 - 5.1 mmol/L 3.7 3.6 3.1(L)  Chloride 98 - 111 mmol/L 104 107 107  CO2 22 - 32 mmol/L 19(L) 24 21(L)  Calcium 8.9 - 10.3 mg/dL 8.2(L) 8.4(L) 8.1(L)  Total Protein 6.5 - 8.1 g/dL 6.1(L) 6.2(L) 6.0(L)  Total Bilirubin 0.3 - 1.2 mg/dL 0.6 0.6 0.4  Alkaline Phos 38 - 126 U/L 79 85 105  AST 15 - 41 U/L 72(H) 27 15  ALT 0 - 44 U/L 98(H) 24 10    Lab Results  Component Value Date   WBC 7.1 07/06/2019   HGB 12.4 07/06/2019   HCT 40.1 07/06/2019   MCV 97.1 07/06/2019   PLT 219 07/06/2019   NEUTROABS 3.0 07/03/2019    ASSESSMENT & PLAN:  Malignant neoplasm of overlapping sites of left breast in female, estrogen receptor positive ([place]) 02/26/2019:Pregnant lady with 59-monthhistory of left breast swelling and skin thickening, mammogram showed pleomorphic calcifications 8 cm, axilla lymph node biopsy positive ER 60%, PR 60%, Ki-67 50%, HER-2 negative ratio 1.25, anterior and posterior end of the calcifications biopsied: 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 Neoadjuvant chemotherapy with dose dense Adriamycin and Cytoxan given every 3 weeks without Neulasta support given her  pregnancy completed 05/28/2019 2. After delivery, weekly Taxol x12 starting 07/03/2019 3. Followed by mastectomy and targeted node dissection versus full axillary lymph node dissection depending on the response. 4. Adjuvant radiation therapy 5. Followed by adjuvant antiestrogen therapy with complete estrogen blockade (ovarian suppression with AI) ----------------------------------------------------------------------------------------------------------------------------------------------------- Echo completed on 03/16/2018: EF 60-65% Breast MRI cannot be performed in the second trimester.  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    Dr. BHaroldine Lawsstarted her on Entresto and digoxin.  Also on Lasix once a day.  She is feeling remarkably better today.  She is not short of breath to walking with exertion. 2.  Chemo induced anemia: Resolved 3.  Taxol related tachycardia: We will reinitiate therapy.  We will obtain CT chest abdomen pelvis and bone scan in the next week. She will come weekly for Taxol and I will see her every other week  No orders of the defined types were placed  in this encounter.  The patient has a good understanding of the overall plan. she agrees with it. she will call with any problems that may develop before the next visit here.  Total time spent: 30 mins including face to face time and time spent for planning, charting and coordination of care  Nicholas Lose, MD 07/10/2019  I, Cloyde Reams Dorshimer, am acting as scribe for Dr. Nicholas Lose.  I have reviewed the above documentation for accuracy and completeness, and I agree with the above.

## 2019-07-10 ENCOUNTER — Other Ambulatory Visit: Payer: Self-pay

## 2019-07-10 ENCOUNTER — Other Ambulatory Visit: Payer: Self-pay | Admitting: Hematology and Oncology

## 2019-07-10 ENCOUNTER — Inpatient Hospital Stay: Payer: Medicaid Other

## 2019-07-10 ENCOUNTER — Inpatient Hospital Stay (HOSPITAL_BASED_OUTPATIENT_CLINIC_OR_DEPARTMENT_OTHER): Payer: Medicaid Other | Admitting: Hematology and Oncology

## 2019-07-10 VITALS — BP 115/86 | HR 95 | Temp 97.9°F | Resp 18

## 2019-07-10 DIAGNOSIS — Z95828 Presence of other vascular implants and grafts: Secondary | ICD-10-CM

## 2019-07-10 DIAGNOSIS — C50812 Malignant neoplasm of overlapping sites of left female breast: Secondary | ICD-10-CM

## 2019-07-10 DIAGNOSIS — Z170 Estrogen receptor positive status [ER+]: Secondary | ICD-10-CM

## 2019-07-10 DIAGNOSIS — Z5111 Encounter for antineoplastic chemotherapy: Secondary | ICD-10-CM | POA: Diagnosis not present

## 2019-07-10 LAB — CMP (CANCER CENTER ONLY)
ALT: 43 U/L (ref 0–44)
AST: 26 U/L (ref 15–41)
Albumin: 2.8 g/dL — ABNORMAL LOW (ref 3.5–5.0)
Alkaline Phosphatase: 97 U/L (ref 38–126)
Anion gap: 12 (ref 5–15)
BUN: 9 mg/dL (ref 6–20)
CO2: 26 mmol/L (ref 22–32)
Calcium: 8.4 mg/dL — ABNORMAL LOW (ref 8.9–10.3)
Chloride: 106 mmol/L (ref 98–111)
Creatinine: 1.05 mg/dL — ABNORMAL HIGH (ref 0.44–1.00)
GFR, Est AFR Am: >60 mL/min (ref >60)
GFR, Estimated: >60 mL/min (ref >60)
Glucose, Bld: 102 mg/dL — ABNORMAL HIGH (ref 70–99)
Potassium: 3.7 mmol/L (ref 3.5–5.1)
Sodium: 144 mmol/L (ref 135–145)
Total Bilirubin: 0.5 mg/dL (ref 0.3–1.2)
Total Protein: 6.2 g/dL — ABNORMAL LOW (ref 6.5–8.1)

## 2019-07-10 LAB — CBC WITH DIFFERENTIAL (CANCER CENTER ONLY)
Abs Immature Granulocytes: 0.02 10*3/uL (ref 0.00–0.07)
Basophils Absolute: 0.1 10*3/uL (ref 0.0–0.1)
Basophils Relative: 1 %
Eosinophils Absolute: 0.2 10*3/uL (ref 0.0–0.5)
Eosinophils Relative: 4 %
HCT: 41.2 % (ref 36.0–46.0)
Hemoglobin: 13.4 g/dL (ref 12.0–15.0)
Immature Granulocytes: 0 %
Lymphocytes Relative: 26 %
Lymphs Abs: 1.5 10*3/uL (ref 0.7–4.0)
MCH: 30.5 pg (ref 26.0–34.0)
MCHC: 32.5 g/dL (ref 30.0–36.0)
MCV: 93.6 fL (ref 80.0–100.0)
Monocytes Absolute: 0.6 10*3/uL (ref 0.1–1.0)
Monocytes Relative: 10 %
Neutro Abs: 3.4 10*3/uL (ref 1.7–7.7)
Neutrophils Relative %: 59 %
Platelet Count: 242 10*3/uL (ref 150–400)
RBC: 4.4 MIL/uL (ref 3.87–5.11)
RDW: 14.8 % (ref 11.5–15.5)
WBC Count: 5.8 10*3/uL (ref 4.0–10.5)
nRBC: 0 % (ref 0.0–0.2)

## 2019-07-10 MED ORDER — SODIUM CHLORIDE 0.9% FLUSH
10.0000 mL | INTRAVENOUS | Status: DC | PRN
  Administered 2019-07-10: 10 mL
  Filled 2019-07-10: qty 10

## 2019-07-10 MED ORDER — FAMOTIDINE IN NACL 20-0.9 MG/50ML-% IV SOLN
INTRAVENOUS | Status: AC
  Filled 2019-07-10: qty 50

## 2019-07-10 MED ORDER — HEPARIN SOD (PORK) LOCK FLUSH 100 UNIT/ML IV SOLN
500.0000 [IU] | Freq: Once | INTRAVENOUS | Status: AC | PRN
  Administered 2019-07-10: 500 [IU]
  Filled 2019-07-10: qty 5

## 2019-07-10 MED ORDER — SODIUM CHLORIDE 0.9% FLUSH
10.0000 mL | Freq: Once | INTRAVENOUS | Status: AC
  Administered 2019-07-10: 10 mL
  Filled 2019-07-10: qty 10

## 2019-07-10 MED ORDER — FAMOTIDINE IN NACL 20-0.9 MG/50ML-% IV SOLN
20.0000 mg | Freq: Once | INTRAVENOUS | Status: AC
  Administered 2019-07-10: 10:00:00 20 mg via INTRAVENOUS

## 2019-07-10 MED ORDER — DIPHENHYDRAMINE HCL 50 MG/ML IJ SOLN
25.0000 mg | Freq: Once | INTRAMUSCULAR | Status: AC
  Administered 2019-07-10: 25 mg via INTRAVENOUS

## 2019-07-10 MED ORDER — DIPHENHYDRAMINE HCL 50 MG/ML IJ SOLN
INTRAMUSCULAR | Status: AC
  Filled 2019-07-10: qty 1

## 2019-07-10 MED ORDER — SODIUM CHLORIDE 0.9 % IV SOLN
Freq: Once | INTRAVENOUS | Status: AC
  Filled 2019-07-10: qty 250

## 2019-07-10 MED ORDER — SODIUM CHLORIDE 0.9 % IV SOLN
80.0000 mg/m2 | Freq: Once | INTRAVENOUS | Status: AC
  Administered 2019-07-10: 162 mg via INTRAVENOUS
  Filled 2019-07-10: qty 27

## 2019-07-10 NOTE — Patient Instructions (Signed)
[facility] Discharge Instructions for Patients Receiving Chemotherapy  Today you received the following chemotherapy agents Taxol  To help prevent nausea and vomiting after your treatment, we encourage you to take your nausea medication    If you develop nausea and vomiting that is not controlled by your nausea medication, call the clinic.   BELOW ARE SYMPTOMS THAT SHOULD BE REPORTED IMMEDIATELY:  *FEVER GREATER THAN 100.5 F  *CHILLS WITH OR WITHOUT FEVER  NAUSEA AND VOMITING THAT IS NOT CONTROLLED WITH YOUR NAUSEA MEDICATION  *UNUSUAL SHORTNESS OF BREATH  *UNUSUAL BRUISING OR BLEEDING  TENDERNESS IN MOUTH AND THROAT WITH OR WITHOUT PRESENCE OF ULCERS  *URINARY PROBLEMS  *BOWEL PROBLEMS  UNUSUAL RASH Items with * indicate a potential emergency and should be followed up as soon as possible.  Feel free to call the clinic should you have any questions or concerns. The clinic phone number is [phone].  Please show the [place] at check-in to the Emergency Department and triage nurse.  Paclitaxel injection What is this medicine? PACLITAXEL (PAK li TAX el) is a chemotherapy drug. It targets fast dividing cells, like cancer cells, and causes these cells to die. This medicine is used to treat ovarian cancer, breast cancer, lung cancer, Kaposi's sarcoma, and other cancers. This medicine may be used for other purposes; ask your health care provider or pharmacist if you have questions. COMMON BRAND NAME(S): Onxol, Taxol What should I tell my health care provider before I take this medicine? They need to know if you have any of these conditions:  history of irregular heartbeat  liver disease  low blood counts, like low white cell, platelet, or red cell counts  lung or breathing disease, like asthma  tingling of the fingers or toes, or other nerve disorder  an unusual or allergic reaction to paclitaxel, alcohol, polyoxyethylated castor oil, other  chemotherapy, other medicines, foods, dyes, or preservatives  pregnant or trying to get pregnant  breast-feeding How should I use this medicine? This drug is given as an infusion into a vein. It is administered in a hospital or clinic by a specially trained health care professional. Talk to your pediatrician regarding the use of this medicine in children. Special care may be needed. Overdosage: If you think you have taken too much of this medicine contact a poison control center or emergency room at once. NOTE: This medicine is only for you. Do not share this medicine with others. What if I miss a dose? It is important not to miss your dose. Call your doctor or health care professional if you are unable to keep an appointment. What may interact with this medicine? Do not take this medicine with any of the following medications:  disulfiram  metronidazole This medicine may also interact with the following medications:  antiviral medicines for hepatitis, HIV or AIDS  certain antibiotics like erythromycin and clarithromycin  certain medicines for fungal infections like ketoconazole and itraconazole  certain medicines for seizures like carbamazepine, phenobarbital, phenytoin  gemfibrozil  nefazodone  rifampin  St. John's wort This list may not describe all possible interactions. Give your health care provider a list of all the medicines, herbs, non-prescription drugs, or dietary supplements you use. Also tell them if you smoke, drink alcohol, or use illegal drugs. Some items may interact with your medicine. What should I watch for while using this medicine? Your condition will be monitored carefully while you are receiving this medicine. You will need important blood work done  while you are taking this medicine. This medicine can cause serious allergic reactions. To reduce your risk you will need to take other medicine(s) before treatment with this medicine. If you experience  allergic reactions like skin rash, itching or hives, swelling of the face, lips, or tongue, tell your doctor or health care professional right away. In some cases, you may be given additional medicines to help with side effects. Follow all directions for their use. This drug may make you feel generally unwell. This is not uncommon, as chemotherapy can affect healthy cells as well as cancer cells. Report any side effects. Continue your course of treatment even though you feel ill unless your doctor tells you to stop. Call your doctor or health care professional for advice if you get a fever, chills or sore throat, or other symptoms of a cold or flu. Do not treat yourself. This drug decreases your body's ability to fight infections. Try to avoid being around people who are sick. This medicine may increase your risk to bruise or bleed. Call your doctor or health care professional if you notice any unusual bleeding. Be careful brushing and flossing your teeth or using a toothpick because you may get an infection or bleed more easily. If you have any dental work done, tell your dentist you are receiving this medicine. Avoid taking products that contain aspirin, acetaminophen, ibuprofen, naproxen, or ketoprofen unless instructed by your doctor. These medicines may hide a fever. Do not become pregnant while taking this medicine. Women should inform their doctor if they wish to become pregnant or think they might be pregnant. There is a potential for serious side effects to an unborn child. Talk to your health care professional or pharmacist for more information. Do not breast-feed an infant while taking this medicine. Men are advised not to father a child while receiving this medicine. This product may contain alcohol. Ask your pharmacist or healthcare provider if this medicine contains alcohol. Be sure to tell all healthcare providers you are taking this medicine. Certain medicines, like metronidazole and  disulfiram, can cause an unpleasant reaction when taken with alcohol. The reaction includes flushing, headache, nausea, vomiting, sweating, and increased thirst. The reaction can last from 30 minutes to several hours. What side effects may I notice from receiving this medicine? Side effects that you should report to your doctor or health care professional as soon as possible:  allergic reactions like skin rash, itching or hives, swelling of the face, lips, or tongue  breathing problems  changes in vision  fast, irregular heartbeat  high or low blood pressure  mouth sores  pain, tingling, numbness in the hands or feet  signs of decreased platelets or bleeding - bruising, pinpoint red spots on the skin, black, tarry stools, blood in the urine  signs of decreased red blood cells - unusually weak or tired, feeling faint or lightheaded, falls  signs of infection - fever or chills, cough, sore throat, pain or difficulty passing urine  signs and symptoms of liver injury like dark yellow or brown urine; general ill feeling or flu-like symptoms; light-colored stools; loss of appetite; nausea; right upper belly pain; unusually weak or tired; yellowing of the eyes or skin  swelling of the ankles, feet, hands  unusually slow heartbeat Side effects that usually do not require medical attention (report to your doctor or health care professional if they continue or are bothersome):  diarrhea  hair loss  loss of appetite  muscle or joint pain  nausea,  vomiting  pain, redness, or irritation at site where injected  tiredness This list may not describe all possible side effects. Call your doctor for medical advice about side effects. You may report side effects to FDA at 1-800-FDA-1088. Where should I keep my medicine? This drug is given in a hospital or clinic and will not be stored at home. NOTE: This sheet is a summary. It may not cover all possible information. If you have questions  about this medicine, talk to your doctor, pharmacist, or health care provider.  2020 Elsevier/Gold Standard (2016-10-30 13:14:55)

## 2019-07-10 NOTE — Assessment & Plan Note (Signed)
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 Neoadjuvant chemotherapy with dose dense Adriamycin and Cytoxan given every 3 weeks without Neulasta support given her pregnancy completed 05/28/2019 2. After delivery, weekly Taxol x12 starting 07/03/2019 3. Followed by mastectomy and targeted node dissection versus full axillary lymph node dissection depending on the response. 4. Adjuvant radiation therapy 5. Followed by adjuvant antiestrogen therapy with complete estrogen blockade (ovarian suppression with AI) ----------------------------------------------------------------------------------------------------------------------------------------------------- Echo completed on 03/16/2018: EF 60-65% Breast MRI cannot be performed in the second trimester.  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    Dr. BHaroldine Lawsstarted her on Entresto and digoxin.  Also on Lasix once a day. 2.  Chemo induced anemia 3.  Taxol related tachycardia: We will reinitiate therapy.

## 2019-07-13 NOTE — Progress Notes (Signed)
Pharmacist Chemotherapy Monitoring - Follow Up Assessment    I verify that I have reviewed each item in the below checklist:  . Regimen for the patient is scheduled for the appropriate day and plan matches scheduled date. Marland Kitchen Appropriate non-routine labs are ordered dependent on drug ordered. . If applicable, additional medications reviewed and ordered per protocol based on lifetime cumulative doses and/or treatment regimen.   Plan for follow-up and/or issues identified: No . I-vent associated with next due treatment: No . MD and/or nursing notified: No  Philomena Course 07/13/2019 4:02 PM

## 2019-07-17 ENCOUNTER — Other Ambulatory Visit: Payer: Self-pay

## 2019-07-17 ENCOUNTER — Inpatient Hospital Stay: Payer: Medicaid Other

## 2019-07-17 ENCOUNTER — Inpatient Hospital Stay: Payer: Medicaid Other | Attending: Hematology and Oncology

## 2019-07-17 VITALS — BP 125/86 | HR 96 | Temp 98.0°F | Resp 18

## 2019-07-17 DIAGNOSIS — Z8249 Family history of ischemic heart disease and other diseases of the circulatory system: Secondary | ICD-10-CM | POA: Diagnosis not present

## 2019-07-17 DIAGNOSIS — Z5111 Encounter for antineoplastic chemotherapy: Secondary | ICD-10-CM | POA: Insufficient documentation

## 2019-07-17 DIAGNOSIS — C50812 Malignant neoplasm of overlapping sites of left female breast: Secondary | ICD-10-CM | POA: Insufficient documentation

## 2019-07-17 DIAGNOSIS — Z803 Family history of malignant neoplasm of breast: Secondary | ICD-10-CM | POA: Diagnosis not present

## 2019-07-17 DIAGNOSIS — Z801 Family history of malignant neoplasm of trachea, bronchus and lung: Secondary | ICD-10-CM | POA: Insufficient documentation

## 2019-07-17 DIAGNOSIS — Z800 Family history of malignant neoplasm of digestive organs: Secondary | ICD-10-CM | POA: Insufficient documentation

## 2019-07-17 DIAGNOSIS — Z79899 Other long term (current) drug therapy: Secondary | ICD-10-CM | POA: Insufficient documentation

## 2019-07-17 DIAGNOSIS — Z170 Estrogen receptor positive status [ER+]: Secondary | ICD-10-CM

## 2019-07-17 DIAGNOSIS — Z87891 Personal history of nicotine dependence: Secondary | ICD-10-CM | POA: Diagnosis not present

## 2019-07-17 DIAGNOSIS — Z95828 Presence of other vascular implants and grafts: Secondary | ICD-10-CM

## 2019-07-17 LAB — CMP (CANCER CENTER ONLY)
ALT: 25 U/L (ref 0–44)
AST: 26 U/L (ref 15–41)
Albumin: 3 g/dL — ABNORMAL LOW (ref 3.5–5.0)
Alkaline Phosphatase: 91 U/L (ref 38–126)
Anion gap: 10 (ref 5–15)
BUN: 11 mg/dL (ref 6–20)
CO2: 25 mmol/L (ref 22–32)
Calcium: 8.5 mg/dL — ABNORMAL LOW (ref 8.9–10.3)
Chloride: 106 mmol/L (ref 98–111)
Creatinine: 0.85 mg/dL (ref 0.44–1.00)
GFR, Est AFR Am: >60 mL/min (ref >60)
GFR, Estimated: >60 mL/min (ref >60)
Glucose, Bld: 96 mg/dL (ref 70–99)
Potassium: 3.8 mmol/L (ref 3.5–5.1)
Sodium: 141 mmol/L (ref 135–145)
Total Bilirubin: 0.3 mg/dL (ref 0.3–1.2)
Total Protein: 6.7 g/dL (ref 6.5–8.1)

## 2019-07-17 LAB — CBC WITH DIFFERENTIAL (CANCER CENTER ONLY)
Abs Immature Granulocytes: 0.03 10*3/uL (ref 0.00–0.07)
Basophils Absolute: 0.1 10*3/uL (ref 0.0–0.1)
Basophils Relative: 1 %
Eosinophils Absolute: 0.2 10*3/uL (ref 0.0–0.5)
Eosinophils Relative: 6 %
HCT: 39.4 % (ref 36.0–46.0)
Hemoglobin: 12.4 g/dL (ref 12.0–15.0)
Immature Granulocytes: 1 %
Lymphocytes Relative: 43 %
Lymphs Abs: 1.6 10*3/uL (ref 0.7–4.0)
MCH: 29.8 pg (ref 26.0–34.0)
MCHC: 31.5 g/dL (ref 30.0–36.0)
MCV: 94.7 fL (ref 80.0–100.0)
Monocytes Absolute: 0.2 10*3/uL (ref 0.1–1.0)
Monocytes Relative: 5 %
Neutro Abs: 1.7 10*3/uL (ref 1.7–7.7)
Neutrophils Relative %: 44 %
Platelet Count: 220 10*3/uL (ref 150–400)
RBC: 4.16 MIL/uL (ref 3.87–5.11)
RDW: 14.9 % (ref 11.5–15.5)
WBC Count: 3.8 10*3/uL — ABNORMAL LOW (ref 4.0–10.5)
nRBC: 0 % (ref 0.0–0.2)

## 2019-07-17 MED ORDER — SODIUM CHLORIDE 0.9% FLUSH
10.0000 mL | Freq: Once | INTRAVENOUS | Status: AC
  Administered 2019-07-17: 10 mL
  Filled 2019-07-17: qty 10

## 2019-07-17 MED ORDER — DIPHENHYDRAMINE HCL 50 MG/ML IJ SOLN
25.0000 mg | Freq: Once | INTRAMUSCULAR | Status: AC
  Administered 2019-07-17: 25 mg via INTRAVENOUS

## 2019-07-17 MED ORDER — SODIUM CHLORIDE 0.9 % IV SOLN
80.0000 mg/m2 | Freq: Once | INTRAVENOUS | Status: AC
  Administered 2019-07-17: 162 mg via INTRAVENOUS
  Filled 2019-07-17: qty 27

## 2019-07-17 MED ORDER — HEPARIN SOD (PORK) LOCK FLUSH 100 UNIT/ML IV SOLN
500.0000 [IU] | Freq: Once | INTRAVENOUS | Status: AC | PRN
  Administered 2019-07-17: 500 [IU]
  Filled 2019-07-17: qty 5

## 2019-07-17 MED ORDER — FAMOTIDINE IN NACL 20-0.9 MG/50ML-% IV SOLN
20.0000 mg | Freq: Once | INTRAVENOUS | Status: AC
  Administered 2019-07-17: 20 mg via INTRAVENOUS

## 2019-07-17 MED ORDER — SODIUM CHLORIDE 0.9 % IV SOLN
Freq: Once | INTRAVENOUS | Status: AC
  Filled 2019-07-17: qty 250

## 2019-07-17 MED ORDER — SODIUM CHLORIDE 0.9% FLUSH
10.0000 mL | INTRAVENOUS | Status: DC | PRN
  Administered 2019-07-17: 10 mL
  Filled 2019-07-17: qty 10

## 2019-07-17 MED ORDER — FAMOTIDINE IN NACL 20-0.9 MG/50ML-% IV SOLN
INTRAVENOUS | Status: AC
  Filled 2019-07-17: qty 50

## 2019-07-17 MED ORDER — DIPHENHYDRAMINE HCL 50 MG/ML IJ SOLN
INTRAMUSCULAR | Status: AC
  Filled 2019-07-17: qty 1

## 2019-07-17 NOTE — Patient Instructions (Signed)
[facility] Discharge Instructions for Patients Receiving Chemotherapy  Today you received the following chemotherapy agents:  Taxol.  To help prevent nausea and vomiting after your treatment, we encourage you to take your nausea medication as directed.   If you develop nausea and vomiting that is not controlled by your nausea medication, call the clinic.   BELOW ARE SYMPTOMS THAT SHOULD BE REPORTED IMMEDIATELY:  *FEVER GREATER THAN 100.5 F  *CHILLS WITH OR WITHOUT FEVER  NAUSEA AND VOMITING THAT IS NOT CONTROLLED WITH YOUR NAUSEA MEDICATION  *UNUSUAL SHORTNESS OF BREATH  *UNUSUAL BRUISING OR BLEEDING  TENDERNESS IN MOUTH AND THROAT WITH OR WITHOUT PRESENCE OF ULCERS  *URINARY PROBLEMS  *BOWEL PROBLEMS  UNUSUAL RASH Items with * indicate a potential emergency and should be followed up as soon as possible.  Feel free to call the clinic should you have any questions or concerns. The clinic phone number is [phone].  Please show the CHEMO ALERT CARD at check-in to the Emergency Department and triage nurse.   

## 2019-07-20 ENCOUNTER — Telehealth (HOSPITAL_COMMUNITY): Payer: Self-pay | Admitting: Pharmacist

## 2019-07-20 NOTE — Telephone Encounter (Signed)
Advanced Heart Failure Patient Advocate Encounter  Prior Authorization for Delene Loll has been approved.    PA# [id number] Recipient ID: [id number] L Effective dates: 07/20/2019 - 07/19/2020  Patients co-pay is $3.00  Audry Riles, PharmD, BCPS, BCCP, CPP Heart Failure Clinic Pharmacist [phone]

## 2019-07-20 NOTE — Progress Notes (Incomplete)
***  In Progress*** ?Referring Physician: Lindi Adie ?Primary Cardiologist: Dr. Haroldine Laws ? ?HPI:  ?Melissa [name] is 29 y.o. female with left breast cancer referred by Dr. Lindi Adie for enrollment into the Cardio-Oncology program due to new-onset cardiomyopathy. ? ?She denied any h/o HTN or heart problems. Diagnosed with left breast CA in 02/2019 when she was 4 months pregnant with her second child. Her first daughter was born at 85 weeks in November 2019. No other complications except pre-term labor. ?? ?Echo 03/17/19: EF 60% RV normal  ?? ?? ?While pregnant she was treated with 4 cycles of?neoadjuvant chemotherapy with dose dense Adriamycin and Cytoxan. She delivered her baby girl on 06/19/19. She did not have problems with HTN or pre-eclampsia.  ?? ?She presented to the ED on 06/27/19 for lower extremity edema and shortness of breath. CT was negative for PE and she was discharged with 5 days of furosemide 20 mg. Echo on 07/02/19 showed a severely decreased ejection fraction, <20%.?  ?? ?ECHO 07/02/19 EF 15-20%. RV moderately reduced   ?? ?Recently presented to HF Clinic on 07/06/19 with Dr. Haroldine Laws. Remained on furosemide 20mg  BID. Weight down 220 lbs-> 205 lbs. Felt much better. Could do ADLs without much problem. Still SOB with going up steps. Orthopnea and PND had resolved. Mild ankle edema.  ? ?Today she returns to HF clinic for pharmacist medication titration. At last visit with MD, Delene Loll 49/51 mg BID and digoxin 0.125 mg daily were initiated. Furosemide was decreased to 20 mg BID. ? ?? Shortness of breath/dyspnea on exertion? {YES NO:22349}  ?? Orthopnea/PND? {YES NO:22349} ?? Edema? {YES NO:22349} ?? Lightheadedness/dizziness? {YES NO:22349} ?? Daily weights at home? {YES NO:22349} ?? Blood pressure/heart rate monitoring at home? {YES NO:22349} ?? Following low-sodium/fluid-restricted diet? {YES NO:22349} ? ?HF Medications: ?Entresto 49/51 mg BID ?Digoxin 0.125 mg daily ?Furosemide 20 mg daily ? Potassium chloride 10 mEq daily ? ?Has the patient been experiencing any side e

## 2019-07-20 NOTE — Progress Notes (Signed)
Pharmacist Chemotherapy Monitoring - Follow Up Assessment    I verify that I have reviewed each item in the below checklist:  . Regimen for the patient is scheduled for the appropriate day and plan matches scheduled date. Marland Kitchen Appropriate non-routine labs are ordered dependent on drug ordered. . If applicable, additional medications reviewed and ordered per protocol based on lifetime cumulative doses and/or treatment regimen.   Plan for follow-up and/or issues identified: No . I-vent associated with next due treatment: No . MD and/or nursing notified: No  Lynetta Tomczak D 07/20/2019 4:25 PM

## 2019-07-22 ENCOUNTER — Encounter (HOSPITAL_COMMUNITY): Payer: Self-pay

## 2019-07-22 ENCOUNTER — Encounter (HOSPITAL_COMMUNITY)
Admission: RE | Admit: 2019-07-22 | Discharge: 2019-07-22 | Disposition: A | Discharge status: Home / Self Care | Payer: Medicaid Other | Source: Ambulatory Visit | Attending: Hematology and Oncology | Admitting: Hematology and Oncology

## 2019-07-22 ENCOUNTER — Other Ambulatory Visit: Payer: Self-pay

## 2019-07-22 ENCOUNTER — Ambulatory Visit (HOSPITAL_COMMUNITY)
Admission: RE | Admit: 2019-07-22 | Discharge: 2019-07-22 | Disposition: A | Discharge status: Home / Self Care | Payer: Medicaid Other | Source: Ambulatory Visit | Attending: Hematology and Oncology | Admitting: Hematology and Oncology

## 2019-07-22 DIAGNOSIS — C50812 Malignant neoplasm of overlapping sites of left female breast: Secondary | ICD-10-CM

## 2019-07-22 DIAGNOSIS — Z170 Estrogen receptor positive status [ER+]: Secondary | ICD-10-CM | POA: Diagnosis present

## 2019-07-22 IMAGING — CT CT ABD-PELV W/ CM
2 of 4 series · 13 of 36 positions shown, 16 images · IV contrast (OMNIPAQUE)
Comparison: [DATE]

CLINICAL DATA: Breast cancer staging.

EXAM:
CT CHEST, ABDOMEN, AND PELVIS WITH CONTRAST
TECHNIQUE: Multidetector CT imaging of the chest, abdomen and pelvis was
performed following the standard protocol during bolus
administration of intravenous contrast.
CONTRAST:  100mL OMNIPAQUE IOHEXOL 300 MG/ML  SOLN

[Series 2: cap with · axial · 0.76mm/px · z∈[+1197,+1717]mm · 10 of 124 slices shown, 13 images]
[im 10/124  mediastinal]
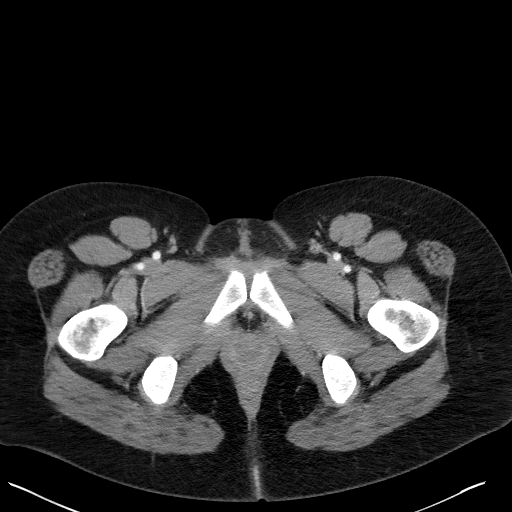
[im 10/124  lung]
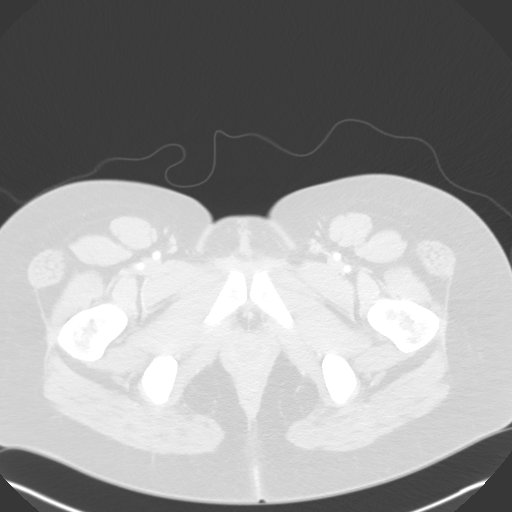
[im 19/124  lung]
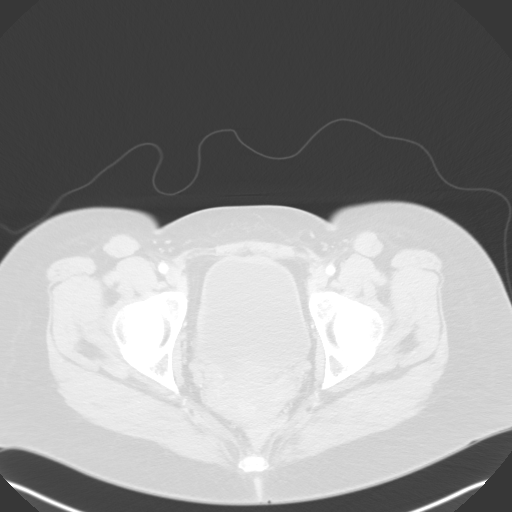
[im 38/124  lung]
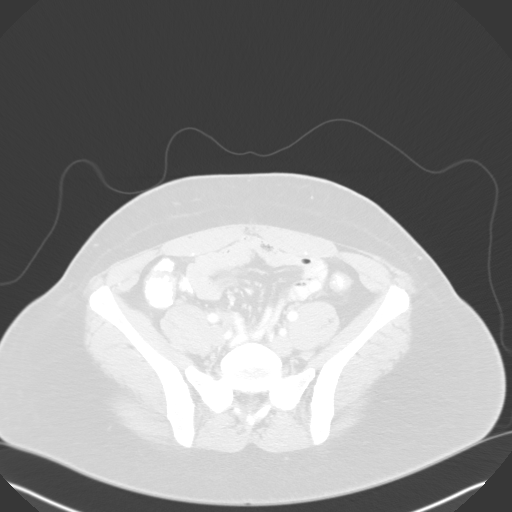
[im 48/124  lung]
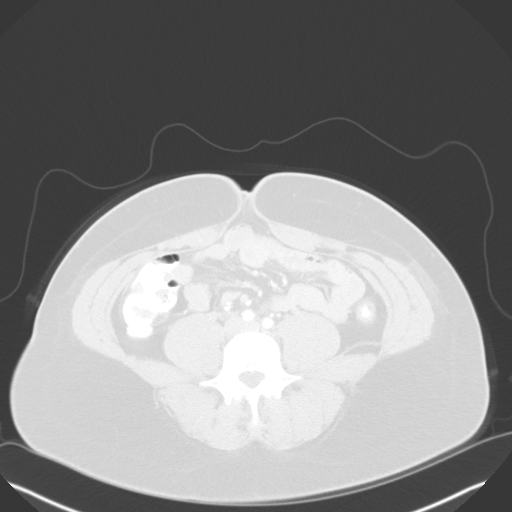
[im 57/124  mediastinal]
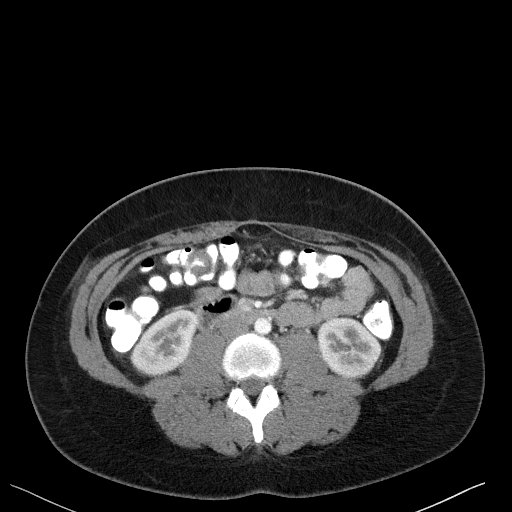
[im 57/124  lung]
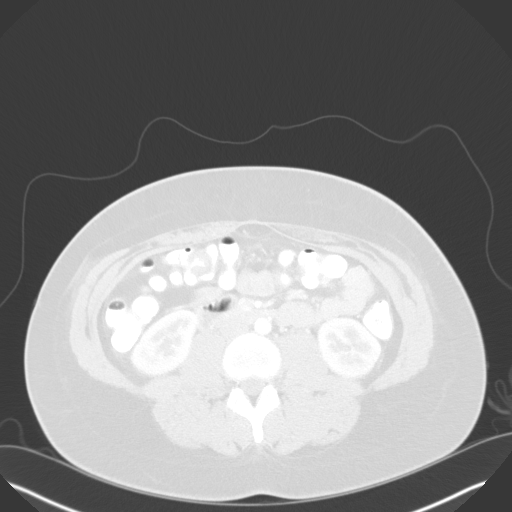
[im 67/124  lung]
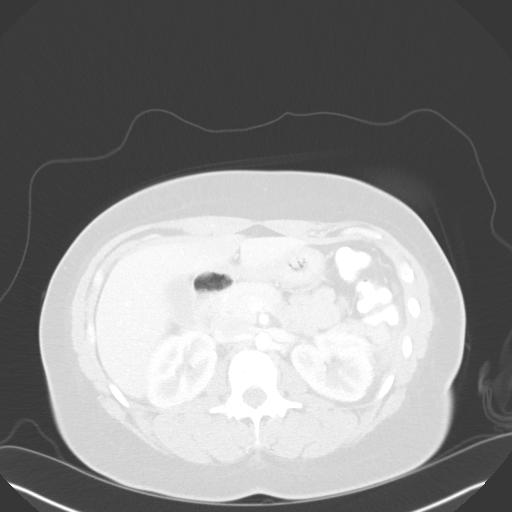
[im 76/124  lung]
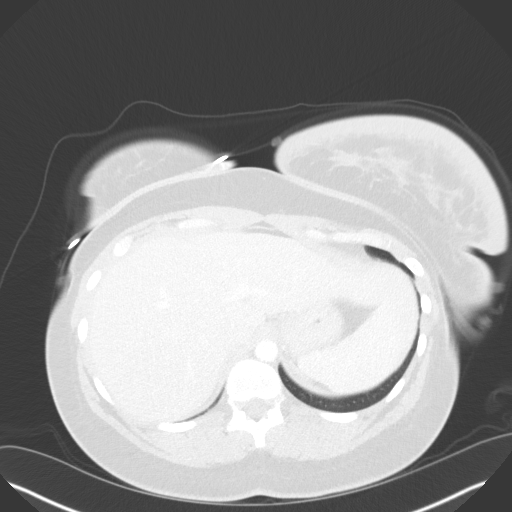
[im 95/124  lung]
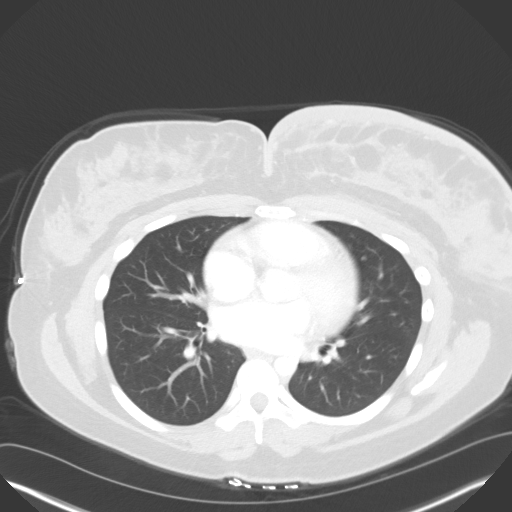
[im 105/124  mediastinal]
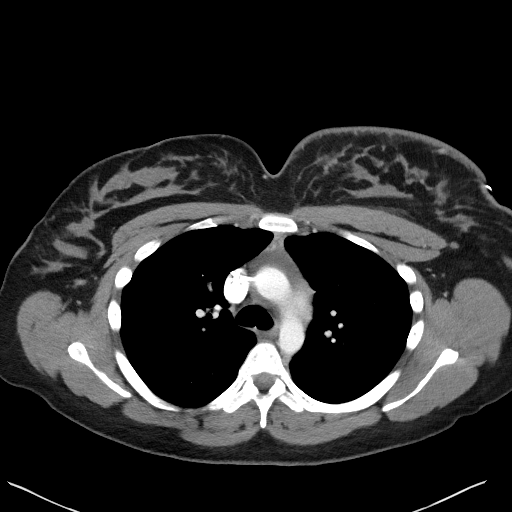
[im 105/124  lung]
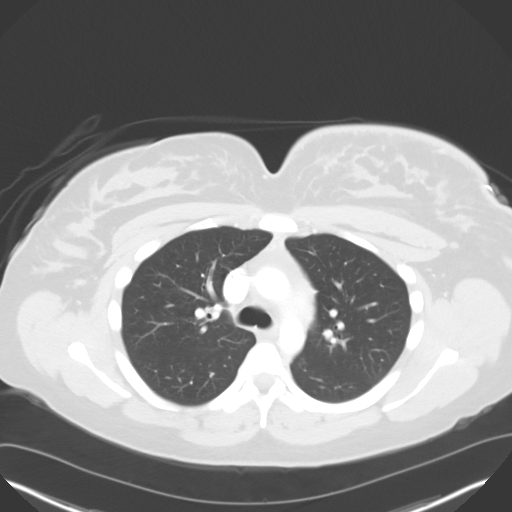
[im 114/124  lung]
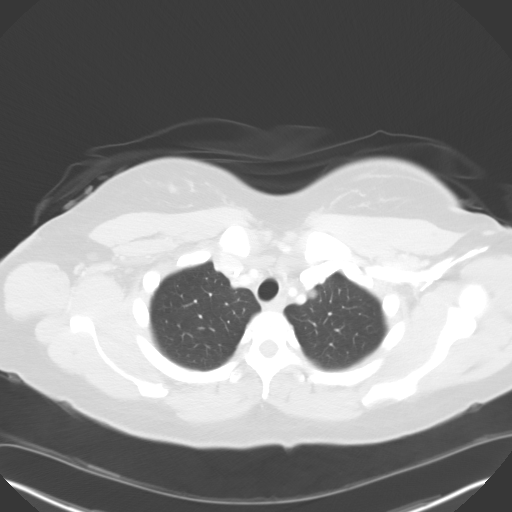

[Series 4: coronals · coronal · 0.94mm/px · 3 of 102 slices shown]
[im 21/102  lung]
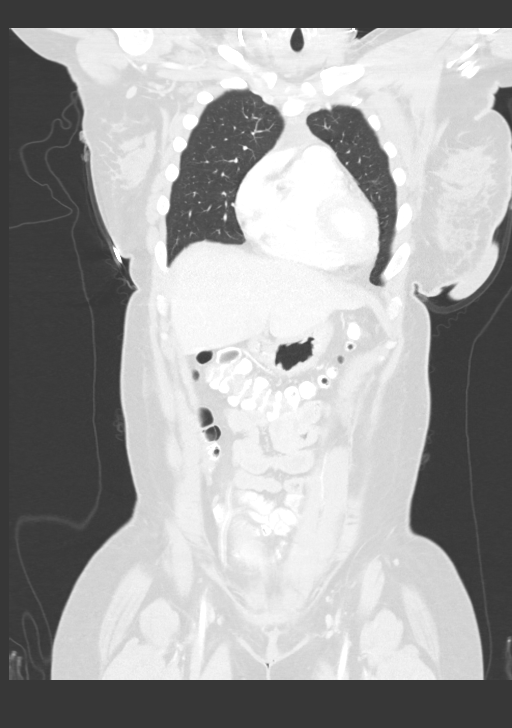
[im 41/102  lung]
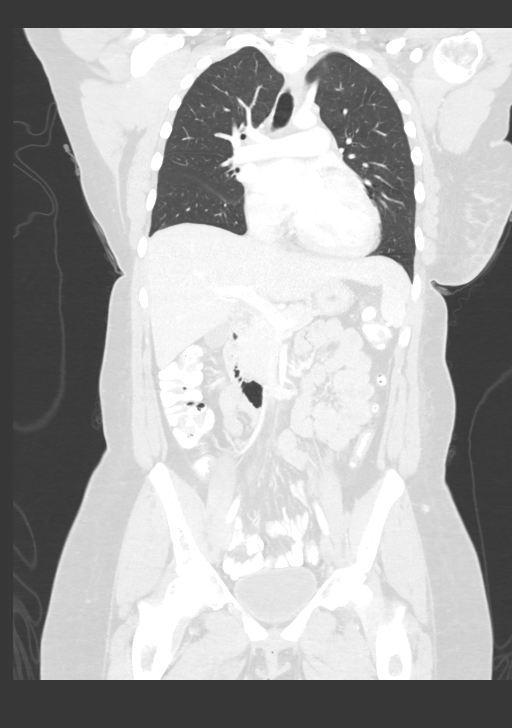
[im 61/102  lung]
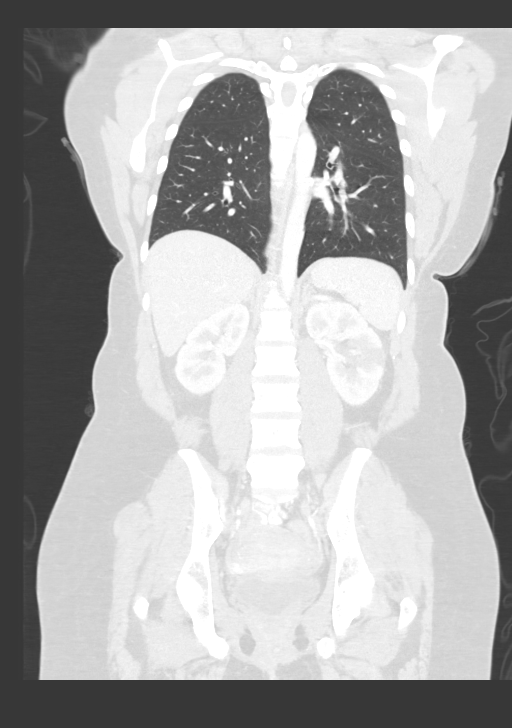

[13 of 36 positions shown; findings below may reference images not displayed]

FINDINGS: CT CHEST FINDINGS

Cardiovascular: RIGHT IJ Port-A-Cath terminates in the proximal to
mid superior vena cava.

Aortic caliber is normal. Heart size remains enlarged. No
pericardial effusion. Central pulmonary vasculature is unremarkable.

Mediastinum/Nodes: No axillary lymphadenopathy. No thoracic inlet
lymphadenopathy. Esophagus is grossly normal.

Lungs/Pleura: No consolidation. No pleural effusion. Airways are
patent. Resolution of pleural effusions seen on the previous study.

Musculoskeletal: Marked skin thickening over the LEFT breast and
asymmetric density throughout the LEFT breast is similar to the
previous exam. Please see below for further details regarding
musculoskeletal findings.

CT ABDOMEN PELVIS FINDINGS

Hepatobiliary: Normal appearance of the liver and biliary tree.

Pancreas: Pancreas is unremarkable. No focal lesion.

Spleen: Spleen normal size without focal lesion.

Adrenals/Urinary Tract: Adrenal glands are normal.

Stranding about the LEFT kidney in the interpolar aspect with
hypodense area measuring 2.2 x 2.0 cm. No hydronephrosis. No
nephrolithiasis. Normal appearance of urinary bladder.

RIGHT kidney is unremarkable.

Stomach/Bowel: No acute gastric or small bowel process.

Question of colonic thickening particularly along the D3 descending
and sigmoid colon

Vascular/Lymphatic: Vascular structures in the abdomen are patent.
No adenopathy in the retroperitoneum or upper abdomen.

No adenopathy in the pelvis.

Reproductive: CT appearance of uterus and adnexa is unremarkable.

Other: Small fat containing umbilical hernia and mild rectus
diastasis.

Musculoskeletal: Sclerotic lesions in the thoracic spine.
Particularly at T8 along the inferior endplate but also involving
the LEFT pedicle and vertebral body very subtle on the prior study.
No acute bone process. Sclerosis also in T9, T10 with small
sclerotic foci less than a cm.
IMPRESSION: 1. Stranding about the LEFT kidney in the interpolar aspect with a
2.2 x 2.0 cm hypodense area. This could represent a focal area of
pyelonephritis or renal infarct, less likely a focal renal mass
given morphology in appearance. Correlate with any pain or history
of recent UTI/pyelonephritis.
2. Sclerotic lesions in the thoracic spine, suspicious for
metastatic disease. These areas appear more sclerotic than on
previous exam potentially representing signs of treatment effect.
3. Question of colonic thickening particularly along the descending
and sigmoid colon. Correlate with any symptoms of colitis.
4. Marked skin thickening over the LEFT breast and asymmetric
density throughout the LEFT breast is similar to the previous exam.

These results will be called to the ordering clinician or
representative by the Radiologist Assistant, and communication
documented in the PACS or [REDACTED].

## 2019-07-22 IMAGING — CT CT CHEST W/ CM
2 of 4 series · 13 of 36 positions shown, 16 images · IV contrast (OMNIPAQUE)
Comparison: [DATE]

CLINICAL DATA: Breast cancer staging.

EXAM:
CT CHEST, ABDOMEN, AND PELVIS WITH CONTRAST
TECHNIQUE: Multidetector CT imaging of the chest, abdomen and pelvis was
performed following the standard protocol during bolus
administration of intravenous contrast.
CONTRAST:  100mL OMNIPAQUE IOHEXOL 300 MG/ML  SOLN

[Series 2: cap with · axial · 0.76mm/px · z∈[+1197,+1717]mm · 10 of 124 slices shown, 13 images]
[im 10/124  mediastinal]
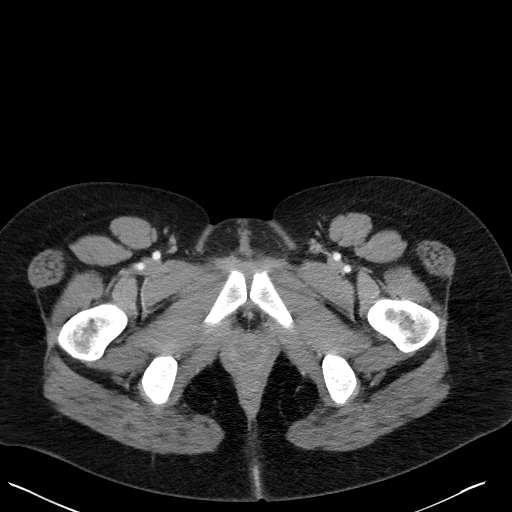
[im 10/124  lung]
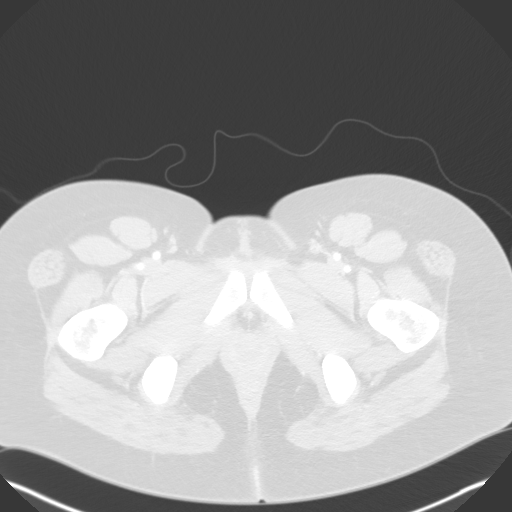
[im 19/124  lung]
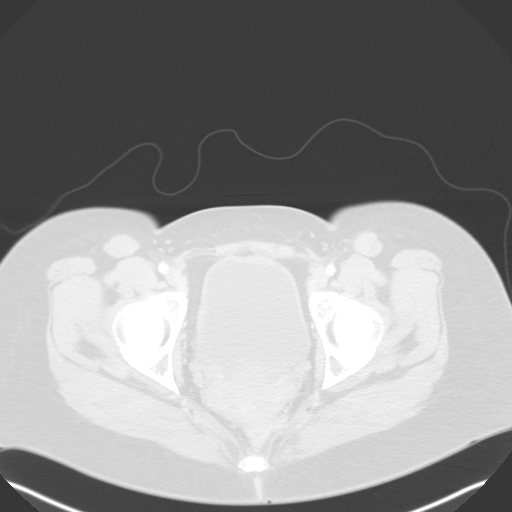
[im 38/124  lung]
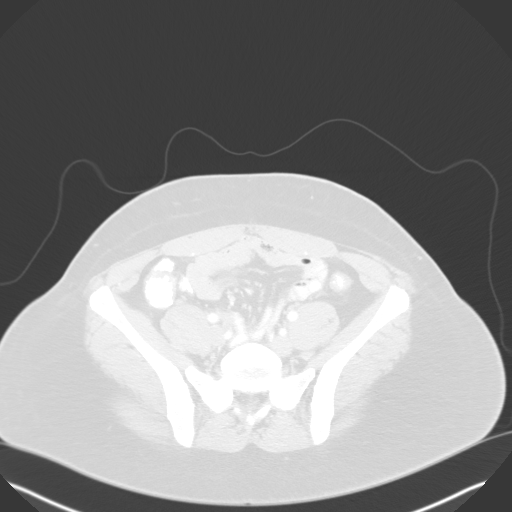
[im 48/124  lung]
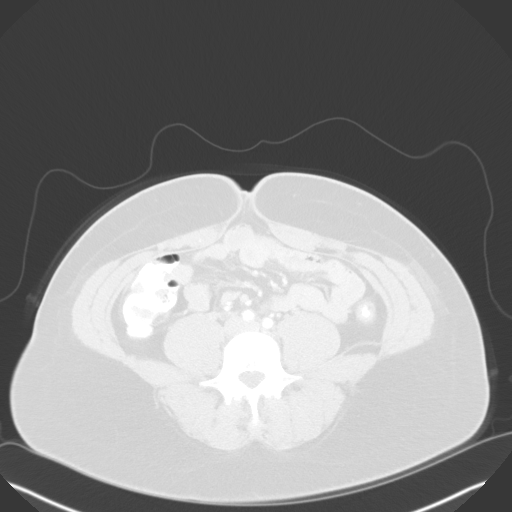
[im 57/124  mediastinal]
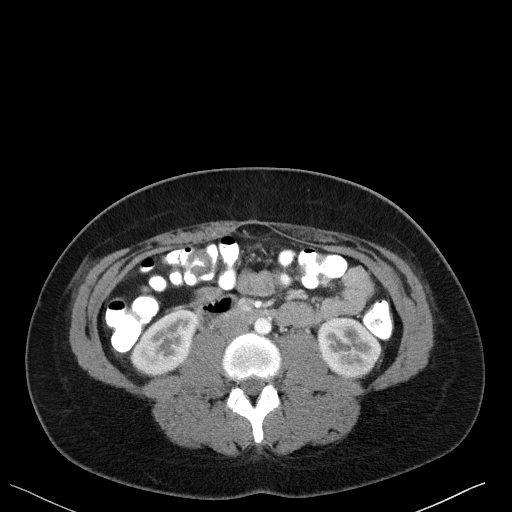
[im 57/124  lung]
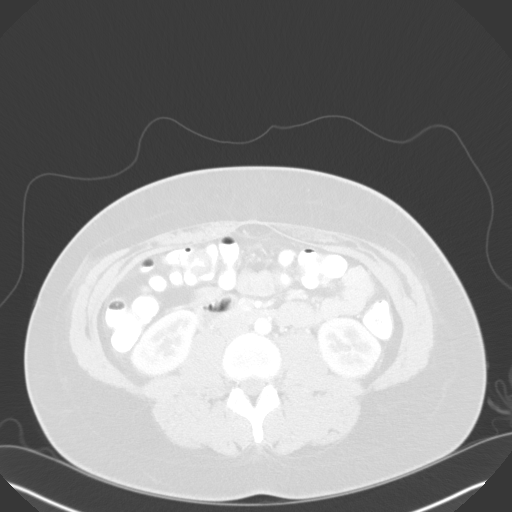
[im 67/124  lung]
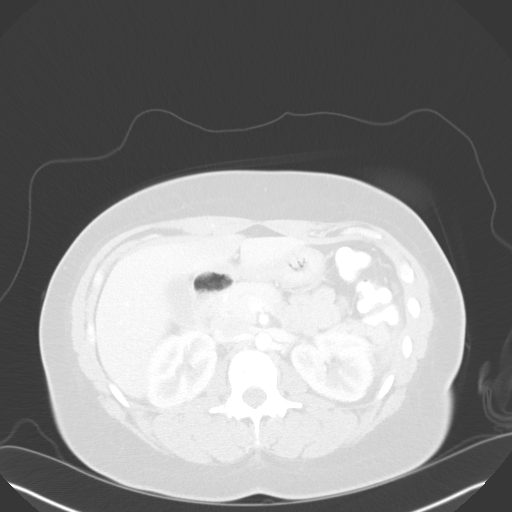
[im 76/124  lung]
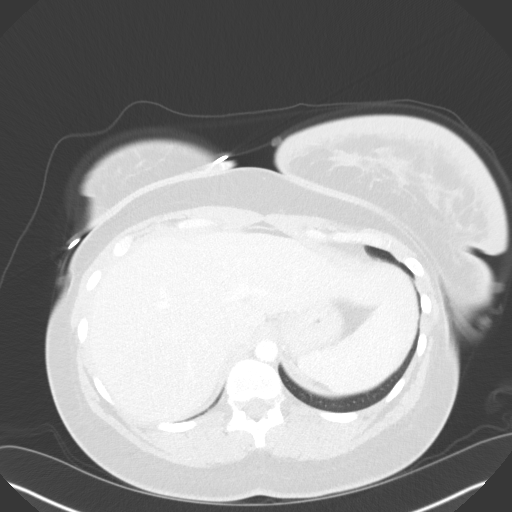
[im 95/124  lung]
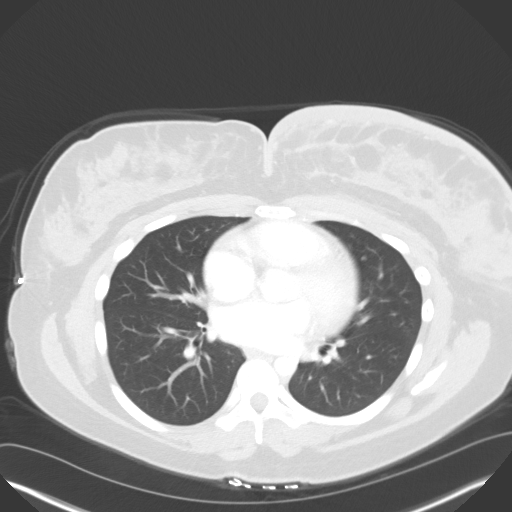
[im 105/124  mediastinal]
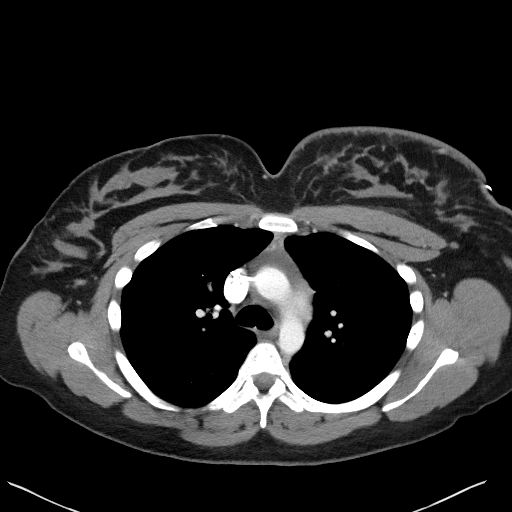
[im 105/124  lung]
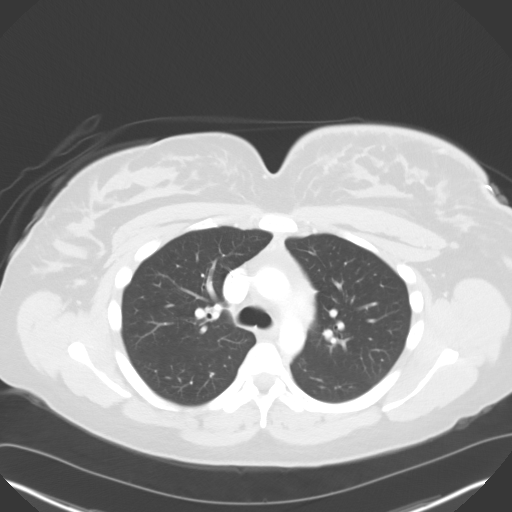
[im 114/124  lung]
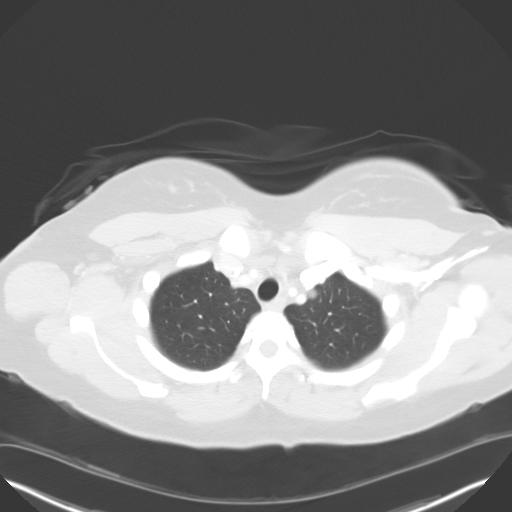

[Series 4: coronals · coronal · 0.94mm/px · 3 of 102 slices shown]
[im 21/102  lung]
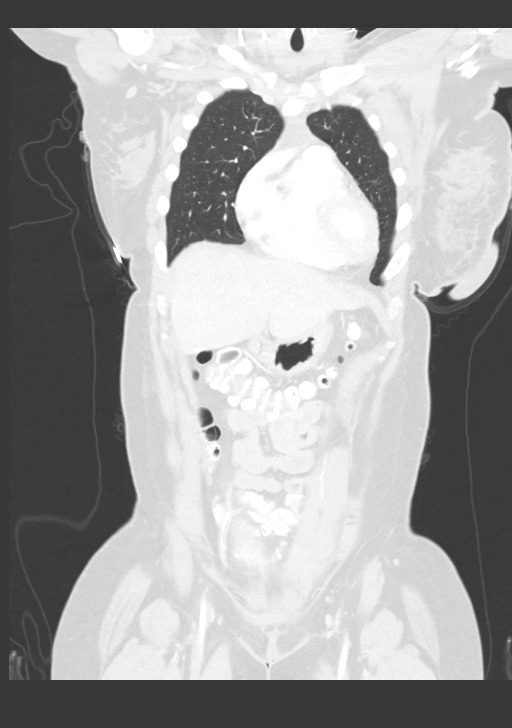
[im 41/102  lung]
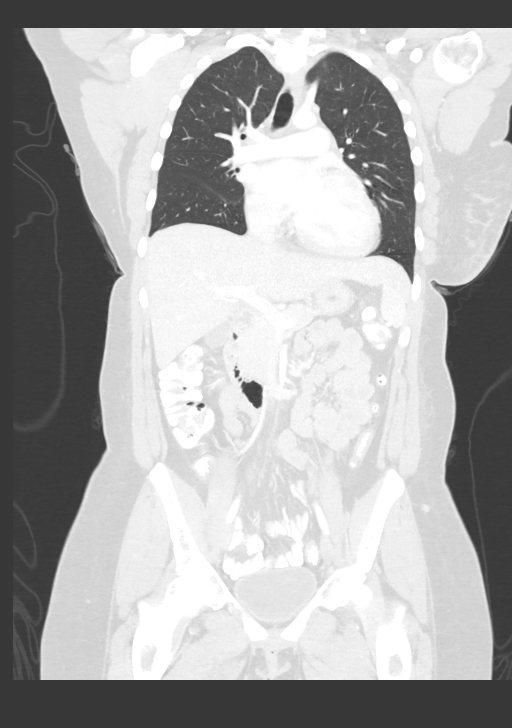
[im 61/102  lung]
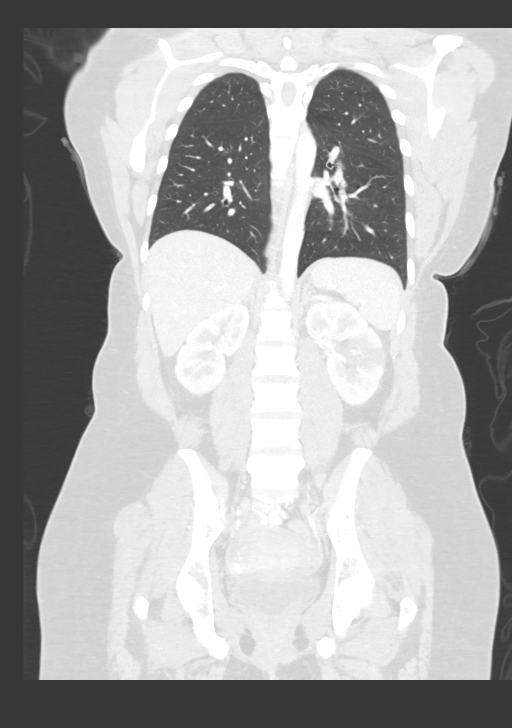

[13 of 36 positions shown; findings below may reference images not displayed]

FINDINGS: CT CHEST FINDINGS

Cardiovascular: RIGHT IJ Port-A-Cath terminates in the proximal to
mid superior vena cava.

Aortic caliber is normal. Heart size remains enlarged. No
pericardial effusion. Central pulmonary vasculature is unremarkable.

Mediastinum/Nodes: No axillary lymphadenopathy. No thoracic inlet
lymphadenopathy. Esophagus is grossly normal.

Lungs/Pleura: No consolidation. No pleural effusion. Airways are
patent. Resolution of pleural effusions seen on the previous study.

Musculoskeletal: Marked skin thickening over the LEFT breast and
asymmetric density throughout the LEFT breast is similar to the
previous exam. Please see below for further details regarding
musculoskeletal findings.

CT ABDOMEN PELVIS FINDINGS

Hepatobiliary: Normal appearance of the liver and biliary tree.

Pancreas: Pancreas is unremarkable. No focal lesion.

Spleen: Spleen normal size without focal lesion.

Adrenals/Urinary Tract: Adrenal glands are normal.

Stranding about the LEFT kidney in the interpolar aspect with
hypodense area measuring 2.2 x 2.0 cm. No hydronephrosis. No
nephrolithiasis. Normal appearance of urinary bladder.

RIGHT kidney is unremarkable.

Stomach/Bowel: No acute gastric or small bowel process.

Question of colonic thickening particularly along the D3 descending
and sigmoid colon

Vascular/Lymphatic: Vascular structures in the abdomen are patent.
No adenopathy in the retroperitoneum or upper abdomen.

No adenopathy in the pelvis.

Reproductive: CT appearance of uterus and adnexa is unremarkable.

Other: Small fat containing umbilical hernia and mild rectus
diastasis.

Musculoskeletal: Sclerotic lesions in the thoracic spine.
Particularly at T8 along the inferior endplate but also involving
the LEFT pedicle and vertebral body very subtle on the prior study.
No acute bone process. Sclerosis also in T9, T10 with small
sclerotic foci less than a cm.
IMPRESSION: 1. Stranding about the LEFT kidney in the interpolar aspect with a
2.2 x 2.0 cm hypodense area. This could represent a focal area of
pyelonephritis or renal infarct, less likely a focal renal mass
given morphology in appearance. Correlate with any pain or history
of recent UTI/pyelonephritis.
2. Sclerotic lesions in the thoracic spine, suspicious for
metastatic disease. These areas appear more sclerotic than on
previous exam potentially representing signs of treatment effect.
3. Question of colonic thickening particularly along the descending
and sigmoid colon. Correlate with any symptoms of colitis.
4. Marked skin thickening over the LEFT breast and asymmetric
density throughout the LEFT breast is similar to the previous exam.

These results will be called to the ordering clinician or
representative by the Radiologist Assistant, and communication
documented in the PACS or [REDACTED].

## 2019-07-22 MED ORDER — IOHEXOL 300 MG/ML  SOLN
100.0000 mL | Freq: Once | INTRAMUSCULAR | Status: AC | PRN
  Administered 2019-07-22: 100 mL via INTRAVENOUS

## 2019-07-22 MED ORDER — SODIUM CHLORIDE (PF) 0.9 % IJ SOLN
INTRAMUSCULAR | Status: AC
  Filled 2019-07-22: qty 50

## 2019-07-22 MED ORDER — IOHEXOL 9 MG/ML PO SOLN
500.0000 mL | ORAL | Status: AC
  Administered 2019-07-22 (×2): 500 mL via ORAL

## 2019-07-22 MED ORDER — TECHNETIUM TC 99M MEDRONATE IV KIT
21.8000 | PACK | Freq: Once | INTRAVENOUS | Status: AC
  Administered 2019-07-22: 21.8 via INTRAVENOUS

## 2019-07-22 MED ORDER — IOHEXOL 9 MG/ML PO SOLN
ORAL | Status: AC
  Filled 2019-07-22: qty 1000

## 2019-07-23 ENCOUNTER — Other Ambulatory Visit: Payer: Self-pay | Admitting: *Deleted

## 2019-07-23 DIAGNOSIS — Z170 Estrogen receptor positive status [ER+]: Secondary | ICD-10-CM

## 2019-07-23 DIAGNOSIS — C50812 Malignant neoplasm of overlapping sites of left female breast: Secondary | ICD-10-CM

## 2019-07-23 DIAGNOSIS — Z95828 Presence of other vascular implants and grafts: Secondary | ICD-10-CM

## 2019-07-23 NOTE — Progress Notes (Addendum)
Melissa [name] Care Team: Melissa [name], [name] (General Practice) [name], RN as Oncology Nurse Navigator Rockwell Germany, RN as Oncology Nurse Navigator Rolm Bookbinder, MD as Surgeon (General Surgery) Nicholas Lose, MD as Medical Oncologist (Hematology and Oncology)  DIAGNOSIS:    ICD-10-CM   1. Malignant neoplasm of overlapping sites of left breast in female, estrogen receptor positive ([place])  C50.812    Z17.0     SUMMARY OF ONCOLOGIC HISTORY: Oncology History  Malignant neoplasm of overlapping sites of left breast in female, estrogen receptor positive ([place])  02/26/2019 Initial Diagnosis   Pregnant lady with 60-monthhistory of left breast swelling and skin thickening, mammogram showed pleomorphic calcifications 8 cm, axilla lymph node biopsy positive ER 60%, PR 60%, Ki-67 50%, HER-2 negative ratio 1.25, anterior and posterior end of the calcifications biopsied: 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 ER 50%, PR 30%, Ki-67 50%, HER-2 negative ratio 1.03   03/04/2019 Cancer Staging   Staging form: Breast, AJCC 8th Edition - Clinical stage from 03/04/2019: Stage IIIB (cT4, cN1, cM0, G3, ER+, PR+, HER2-) - Signed by GNicholas Lose MD on 03/04/2019    Neo-Adjuvant Chemotherapy   Adriamycin and Cytoxan x 4 given every three weeks without Neulasta support., followed by weekly Taxol x 12 versus Taxotere Cytoxan after her delivery   04/15/2019 Genetic Testing   Negative genetic testing:  No pathogenic variants detected on the [employer] Breast Cancer Guidelines-Based Panel or the Common Hereditary Cancers Panel. The report date is 04/15/2019.  The Breast Cancer Guidelines-Based panel offered by [facility] includes sequencing and rearrangement analysis for the following 11 genes:  ATM, BRCA1, BRCA2, CDH1, CHEK2, NBN, NF1, PALB2, PTEN, STK11 and TP53.  The Common Hereditary Cancers Panel offered by [facility] includes sequencing and/or deletion duplication testing of the  following 48 genes: APC, ATM, AXIN2, BARD1, BMPR1A, BRCA1, BRCA2, BRIP1, CDH1, CDK4, CDKN2A (p14ARF), CDKN2A (p16INK4a), CHEK2, CTNNA1, DICER1, EPCAM (Deletion/duplication testing only), GREM1 (promoter region deletion/duplication testing only), KIT, MEN1, MLH1, MSH2, MSH3, MSH6, MUTYH, NBN, NF1, NHTL1, PALB2, PDGFRA, PMS2, POLD1, POLE, PTEN, RAD50, RAD51C, RAD51D, RNF43, SDHB, SDHC, SDHD, SMAD4, SMARCA4. STK11, TP53, TSC1, TSC2, and VHL.  The following genes were evaluated for sequence changes only: SDHA and HOXB13 c.251G>A variant only.      CHIEF COMPLIANT: Cycle4 Taxol  INTERVAL HISTORY: AJanyla Biscoeis a 29y.o. with above-mentioned history of left breast cancerwho completed 4 cycles ofneoadjuvant chemotherapy with dose dense Adriamycin and Cytoxan, delivered her baby, and is currently on weekly Taxol. CT CAP on 07/22/19 showed sclerotic lesions on the thoracic spine suspicious for metastatic disease. Bone scan on 07/22/19 showed no evidence of osseous metastatic disease. She presents to the clinic todayforfollow-up and cycle 4 of Taxol.   ALLERGIES:  has No Known Allergies.  MEDICATIONS:  Current Outpatient Medications  Medication Sig Dispense Refill  . acetaminophen (TYLENOL) 500 MG tablet Take 500 mg by mouth every 6 (six) hours as needed.      . digoxin (LANOXIN) 0.125 MG tablet Take 1 tablet (125 mcg total) by mouth daily. 30 tablet 11  . furosemide (LASIX) 20 MG tablet Take 1 tablet (20 mg total) by mouth daily. May take an additional tablet daily as needed. 60 tablet 1  . lidocaine-prilocaine (EMLA) cream APPLY 1 APPLICATION TOPICALLY ONCE FOR 1 DOSE.    .Marland Kitchenpotassium chloride (KLOR-CON) 10 MEQ tablet Take 1 tablet (10 mEq total) by mouth daily. 30 tablet 3  . sacubitril-valsartan (ENTRESTO) 49-51  MG Take 1 tablet by mouth 2 (two) times daily. 60 tablet 3   No current facility-administered medications for this visit.    PHYSICAL EXAMINATION: ECOG PERFORMANCE STATUS: 1 -  Symptomatic but completely ambulatory  Vitals:   07/24/19 1054  BP: 103/72  Pulse: 90  Resp: 18  Temp: 98.5 F (36.9 C)  SpO2: 100%   Filed Weights   07/24/19 1054  Weight: 193 lb 3.2 oz (87.6 kg)    LABORATORY DATA:  I have reviewed the data as listed CMP Latest Ref Rng & Units 07/17/2019 07/10/2019 07/06/2019  Glucose 70 - 99 mg/dL 96 102(H) 119(H)  BUN 6 - 20 mg/dL '11 9 14  '$ Creatinine 0.44 - 1.00 mg/dL 0.85 1.05(H) 1.21(H)  Sodium 135 - 145 mmol/L 141 144 137  Potassium 3.5 - 5.1 mmol/L 3.8 3.7 3.7  Chloride 98 - 111 mmol/L 106 106 104  CO2 22 - 32 mmol/L 25 26 19(L)  Calcium 8.9 - 10.3 mg/dL 8.5(L) 8.4(L) 8.2(L)  Total Protein 6.5 - 8.1 g/dL 6.7 6.2(L) 6.1(L)  Total Bilirubin 0.3 - 1.2 mg/dL 0.3 0.5 0.6  Alkaline Phos 38 - 126 U/L 91 97 79  AST 15 - 41 U/L 26 26 72(H)  ALT 0 - 44 U/L 25 43 98(H)    Lab Results  Component Value Date   WBC 2.9 (L) 07/24/2019   HGB 11.7 (L) 07/24/2019   HCT 36.4 07/24/2019   MCV 93.8 07/24/2019   PLT 189 07/24/2019   NEUTROABS PENDING 07/24/2019    ASSESSMENT & PLAN:  Malignant neoplasm of overlapping sites of left breast in female, estrogen receptor positive ([place]) 02/26/2019:Pregnant lady with 77-monthhistory of left breast swelling and skin thickening, mammogram showed pleomorphic calcifications 8 cm, axilla lymph node biopsy positive ER 60%, PR 60%, Ki-67 50%, HER-2 negative ratio 1.25, anterior and posterior end of the calcifications biopsied: 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 Neoadjuvant chemotherapy with dose dense Adriamycin and Cytoxan given every 3 weeks without Neulasta support given her pregnancycompleted 05/28/2019 2. After delivery, weekly Taxol x12starting 07/03/2019 3. Followed by mastectomy and targeted  node dissection versus full axillary lymph node dissection depending on the response. 4. Adjuvant radiation therapy 5. Followed by adjuvant antiestrogen therapy with complete estrogen blockade (ovarian suppression with AI) ----------------------------------------------------------------------------------------------------------------------------------------------------- Echo completed on 03/16/2018: EF 60-65% Breast MRI cannot be performed in the second trimester.  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   Dr. BHaroldine Lawsstarted her on Entresto and digoxin.  Also on Lasix once a day. Continuing to show improvement in symptoms. 2.Chemo induced anemia: Resolved   CT CAP 07/23/2019: Stranding about the left kidney 2.2 x 2 cm could be renal infarct.  Sclerotic lesions in the thoracic spine suspicious for metastatic disease particularly T8 also left pedicle and sclerosis at T9-T10.  Colon thickening descending and sigmoid colon.  Left breast thickening  Bone scan 07/23/2019: No demonstrable bony metastatic disease.  Radiology review: I discussed with the Melissa [name] that the scans suggest that she has evidence of possible metastatic disease in the bone.  We would like to treat her as curative intent because of oligometastatic disease. She will  likely need Ibrance after completion of her treatment.  She will come weekly for Taxol and I will see her every other week    No orders of the defined types were placed in this encounter.  The Melissa [name] has a good understanding of the overall plan. she agrees with it. she will call with any problems that may develop before the next visit here.  Total time spent: 30 mins including face to face time and time spent for planning, charting and  coordination of care  Nicholas Lose, MD 07/24/2019  I, Cloyde Reams Dorshimer, am acting as scribe for Dr. Nicholas Lose.  I have reviewed the above documentation for accuracy and completeness, and I agree with the above.  Addendum: Because her [place] is 1.3, we will reduce the dosage of Taxol to 65 mg/m.

## 2019-07-24 ENCOUNTER — Inpatient Hospital Stay: Payer: Medicaid Other

## 2019-07-24 ENCOUNTER — Encounter: Payer: Self-pay | Admitting: *Deleted

## 2019-07-24 ENCOUNTER — Inpatient Hospital Stay (HOSPITAL_BASED_OUTPATIENT_CLINIC_OR_DEPARTMENT_OTHER): Payer: Medicaid Other | Admitting: Hematology and Oncology

## 2019-07-24 ENCOUNTER — Other Ambulatory Visit: Payer: Self-pay

## 2019-07-24 DIAGNOSIS — Z170 Estrogen receptor positive status [ER+]: Secondary | ICD-10-CM

## 2019-07-24 DIAGNOSIS — C50812 Malignant neoplasm of overlapping sites of left female breast: Secondary | ICD-10-CM

## 2019-07-24 DIAGNOSIS — Z95828 Presence of other vascular implants and grafts: Secondary | ICD-10-CM

## 2019-07-24 DIAGNOSIS — Z5111 Encounter for antineoplastic chemotherapy: Secondary | ICD-10-CM | POA: Diagnosis not present

## 2019-07-24 LAB — CMP (CANCER CENTER ONLY)
ALT: 25 U/L (ref 0–44)
AST: 27 U/L (ref 15–41)
Albumin: 3.3 g/dL — ABNORMAL LOW (ref 3.5–5.0)
Alkaline Phosphatase: 93 U/L (ref 38–126)
Anion gap: 9 (ref 5–15)
BUN: 9 mg/dL (ref 6–20)
CO2: 25 mmol/L (ref 22–32)
Calcium: 9 mg/dL (ref 8.9–10.3)
Chloride: 105 mmol/L (ref 98–111)
Creatinine: 0.85 mg/dL (ref 0.44–1.00)
GFR, Est AFR Am: >60 mL/min (ref >60)
GFR, Estimated: >60 mL/min (ref >60)
Glucose, Bld: 88 mg/dL (ref 70–99)
Potassium: 4.3 mmol/L (ref 3.5–5.1)
Sodium: 139 mmol/L (ref 135–145)
Total Bilirubin: 0.3 mg/dL (ref 0.3–1.2)
Total Protein: 6.7 g/dL (ref 6.5–8.1)

## 2019-07-24 LAB — CBC WITH DIFFERENTIAL (CANCER CENTER ONLY)
Abs Immature Granulocytes: 0.03 10*3/uL (ref 0.00–0.07)
Basophils Absolute: 0 10*3/uL (ref 0.0–0.1)
Basophils Relative: 1 %
Eosinophils Absolute: 0.1 10*3/uL (ref 0.0–0.5)
Eosinophils Relative: 3 %
HCT: 36.4 % (ref 36.0–46.0)
Hemoglobin: 11.7 g/dL — ABNORMAL LOW (ref 12.0–15.0)
Immature Granulocytes: 1 %
Lymphocytes Relative: 44 %
Lymphs Abs: 1.3 10*3/uL (ref 0.7–4.0)
MCH: 30.2 pg (ref 26.0–34.0)
MCHC: 32.1 g/dL (ref 30.0–36.0)
MCV: 93.8 fL (ref 80.0–100.0)
Monocytes Absolute: 0.2 10*3/uL (ref 0.1–1.0)
Monocytes Relative: 6 %
Neutro Abs: 1.3 10*3/uL — ABNORMAL LOW (ref 1.7–7.7)
Neutrophils Relative %: 45 %
Platelet Count: 189 10*3/uL (ref 150–400)
RBC: 3.88 MIL/uL (ref 3.87–5.11)
RDW: 14.8 % (ref 11.5–15.5)
WBC Count: 2.9 10*3/uL — ABNORMAL LOW (ref 4.0–10.5)
nRBC: 0 % (ref 0.0–0.2)

## 2019-07-24 MED ORDER — FAMOTIDINE IN NACL 20-0.9 MG/50ML-% IV SOLN
20.0000 mg | Freq: Once | INTRAVENOUS | Status: AC
  Administered 2019-07-24: 20 mg via INTRAVENOUS

## 2019-07-24 MED ORDER — FAMOTIDINE IN NACL 20-0.9 MG/50ML-% IV SOLN
INTRAVENOUS | Status: AC
  Filled 2019-07-24: qty 50

## 2019-07-24 MED ORDER — SODIUM CHLORIDE 0.9% FLUSH
10.0000 mL | INTRAVENOUS | Status: DC | PRN
  Filled 2019-07-24: qty 10

## 2019-07-24 MED ORDER — SODIUM CHLORIDE 0.9% FLUSH
10.0000 mL | Freq: Once | INTRAVENOUS | Status: AC
  Administered 2019-07-24: 10 mL
  Filled 2019-07-24: qty 10

## 2019-07-24 MED ORDER — HEPARIN SOD (PORK) LOCK FLUSH 100 UNIT/ML IV SOLN
500.0000 [IU] | Freq: Once | INTRAVENOUS | Status: DC
  Filled 2019-07-24: qty 5

## 2019-07-24 MED ORDER — SODIUM CHLORIDE 0.9 % IV SOLN
65.0000 mg/m2 | Freq: Once | INTRAVENOUS | Status: AC
  Administered 2019-07-24: 132 mg via INTRAVENOUS
  Filled 2019-07-24: qty 22

## 2019-07-24 MED ORDER — DIPHENHYDRAMINE HCL 50 MG/ML IJ SOLN
25.0000 mg | Freq: Once | INTRAMUSCULAR | Status: AC
  Administered 2019-07-24: 25 mg via INTRAVENOUS

## 2019-07-24 MED ORDER — HEPARIN SOD (PORK) LOCK FLUSH 100 UNIT/ML IV SOLN
500.0000 [IU] | Freq: Once | INTRAVENOUS | Status: DC | PRN
  Filled 2019-07-24: qty 5

## 2019-07-24 MED ORDER — SODIUM CHLORIDE 0.9 % IV SOLN
Freq: Once | INTRAVENOUS | Status: AC
  Filled 2019-07-24: qty 250

## 2019-07-24 MED ORDER — SODIUM CHLORIDE 0.9 % IV SOLN: 80.0000 mg/m2 | Freq: Once | INTRAVENOUS | Status: DC

## 2019-07-24 MED ORDER — DIPHENHYDRAMINE HCL 50 MG/ML IJ SOLN
INTRAMUSCULAR | Status: AC
  Filled 2019-07-24: qty 1

## 2019-07-24 MED ORDER — POTASSIUM CHLORIDE ER 10 MEQ PO TBCR: 10.0000 meq | EXTENDED_RELEASE_TABLET | Freq: Every day | ORAL | 3 refills | Status: DC

## 2019-07-24 NOTE — Patient Instructions (Signed)
[facility] Discharge Instructions for Patients Receiving Chemotherapy  Today you received the following chemotherapy agents Taxol   To help prevent nausea and vomiting after your treatment, we encourage you to take your nausea medication as directed.   If you develop nausea and vomiting that is not controlled by your nausea medication, call the clinic.   BELOW ARE SYMPTOMS THAT SHOULD BE REPORTED IMMEDIATELY:  *FEVER GREATER THAN 100.5 F  *CHILLS WITH OR WITHOUT FEVER  NAUSEA AND VOMITING THAT IS NOT CONTROLLED WITH YOUR NAUSEA MEDICATION  *UNUSUAL SHORTNESS OF BREATH  *UNUSUAL BRUISING OR BLEEDING  TENDERNESS IN MOUTH AND THROAT WITH OR WITHOUT PRESENCE OF ULCERS  *URINARY PROBLEMS  *BOWEL PROBLEMS  UNUSUAL RASH Items with * indicate a potential emergency and should be followed up as soon as possible.  Feel free to call the clinic you have any questions or concerns. The clinic phone number is [phone].  Please show the CHEMO ALERT CARD at check-in to the Emergency Department and triage nurse.   

## 2019-07-24 NOTE — Progress Notes (Signed)
MD Lindi Adie would like to reduce dose of Taxol to 65 mg/m2 due to low anc  Larene [place], PharmD

## 2019-07-24 NOTE — Assessment & Plan Note (Signed)
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 Neoadjuvant chemotherapy with dose dense Adriamycin and Cytoxan given every 3 weeks without Neulasta support given her pregnancycompleted 05/28/2019 2. After delivery, weekly Taxol x12starting 07/03/2019 3. Followed by mastectomy and targeted node dissection versus full axillary lymph node dissection depending on the response. 4. Adjuvant radiation therapy 5. Followed by adjuvant antiestrogen therapy with complete estrogen blockade (ovarian suppression with AI) ----------------------------------------------------------------------------------------------------------------------------------------------------- Echo completed on 03/16/2018: EF 60-65% Breast MRI cannot be performed in the second trimester.  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   Dr. BHaroldine Lawsstarted her on Entresto and digoxin.  Also on Lasix once a day. Continuing to show improvement in symptoms. 2.Chemo induced anemia: Resolved   CT CAP 07/23/2019: Stranding about the left kidney 2.2 x 2 cm could be renal infarct.  Sclerotic lesions in the thoracic spine suspicious for metastatic disease particularly T8 also left pedicle and sclerosis at T9-T10.  Colon thickening descending and  sigmoid colon.  Left breast thickening  Bone scan 07/23/2019: No demonstrable bony metastatic disease.  Radiology review: I discussed with the patient that the scans suggest that she has evidence of possible metastatic disease in the bone.  We would like to treat her as curative intent because of oligometastatic disease. She will likely need Ibrance after completion of her treatment.  She will come weekly for Taxol and I will see her every other week

## 2019-07-24 NOTE — Progress Notes (Signed)
Per MD okay to treat with ANC 1.3 and dose reduction.

## 2019-07-28 NOTE — Progress Notes (Signed)
Pharmacist Chemotherapy Monitoring - Follow Up Assessment    I verify that I have reviewed each item in the below checklist:  . Regimen for the patient is scheduled for the appropriate day and plan matches scheduled date. Marland Kitchen Appropriate non-routine labs are ordered dependent on drug ordered. . If applicable, additional medications reviewed and ordered per protocol based on lifetime cumulative doses and/or treatment regimen.   Plan for follow-up and/or issues identified: No . I-vent associated with next due treatment: No . MD and/or nursing notified: No  Melissa [name] D 07/28/2019 3:50 PM

## 2019-07-29 ENCOUNTER — Encounter (HOSPITAL_COMMUNITY): Payer: Self-pay

## 2019-07-29 ENCOUNTER — Ambulatory Visit (HOSPITAL_COMMUNITY)
Admission: RE | Admit: 2019-07-29 | Discharge: 2019-07-29 | Disposition: A | Discharge status: Home / Self Care | Payer: Medicaid Other | Source: Ambulatory Visit | Attending: Internal Medicine | Admitting: Internal Medicine

## 2019-07-29 ENCOUNTER — Other Ambulatory Visit: Payer: Self-pay

## 2019-07-29 VITALS — BP 116/68 | HR 91 | Ht 63.0 in | Wt 194.4 lb

## 2019-07-29 DIAGNOSIS — Z8751 Personal history of pre-term labor: Secondary | ICD-10-CM | POA: Insufficient documentation

## 2019-07-29 DIAGNOSIS — Z79899 Other long term (current) drug therapy: Secondary | ICD-10-CM | POA: Diagnosis not present

## 2019-07-29 DIAGNOSIS — I5021 Acute systolic (congestive) heart failure: Secondary | ICD-10-CM | POA: Diagnosis present

## 2019-07-29 DIAGNOSIS — Z9221 Personal history of antineoplastic chemotherapy: Secondary | ICD-10-CM | POA: Insufficient documentation

## 2019-07-29 DIAGNOSIS — I110 Hypertensive heart disease with heart failure: Secondary | ICD-10-CM | POA: Insufficient documentation

## 2019-07-29 DIAGNOSIS — Z853 Personal history of malignant neoplasm of breast: Secondary | ICD-10-CM | POA: Diagnosis not present

## 2019-07-29 DIAGNOSIS — I5022 Chronic systolic (congestive) heart failure: Secondary | ICD-10-CM

## 2019-07-29 MED ORDER — SPIRONOLACTONE 25 MG PO TABS: 25.0000 mg | ORAL_TABLET | Freq: Every day | ORAL | 3 refills | Status: DC

## 2019-07-29 NOTE — Progress Notes (Signed)
Referring Physician: Lindi Adie Primary Cardiologist: Dr. Haroldine Laws  HPI:  Melissa [name] is 29 y.o. female with left breast cancer referred by Dr. Lindi Adie for enrollment into the Cardio-Oncology program due to new-onset cardiomyopathy.  She denied any h/o HTN or heart problems. Diagnosed with left breast CA in 02/2019 when she was 4 months pregnant with her second child. Her first daughter was born at 79 weeks in November 2019. No other complications except pre-term labor.  Echo 03/17/19: EF 60% RV normal   While pregnant she was treated with 4 cycles ofneoadjuvant chemotherapy with dose dense Adriamycin and Cytoxan. She delivered her baby girl on 06/19/19. She did not have problems with HTN or pre-eclampsia.   She presented to the ED on 06/27/19 for lower extremity edema and shortness of breath. CT was negative for PE and she was discharged with 5 days of furosemide 20 mg. Echo on 07/02/19 showed a severely decreased ejection fraction, <20%.   ECHO 07/02/19 EF 15-20%. RV moderately reduced    Recently presented to HF Clinic on 07/06/19 with Dr. Haroldine Laws. Remained on furosemide 20mg  BID. Weight down 220 lbs-> 205 lbs. Felt much better. Could do ADLs without much problem. Still SOB with going up steps. Orthopnea and PND had resolved. Mild ankle edema.    Today she returns to HF clinic for pharmacist medication titration. At last visit with MD, Delene Loll 49/51 mg BID and digoxin 0.125 mg daily were initiated. Furosemide was decreased to 20 mg daily. She is feeling well symptomatically. She denies dizziness, lightheadedness, and fatigue. No chest pain or palpitations. Her SOB is improving; she can now walk about a mile without SOB. She is moderately active as well (plays with her children outside). She is able to complete all ADLs. She does not check her weight at home. Weight stable from last visit. She has only been taking furosemide 20mg  daily since last HF clinic visit. She has not needed any additional doses  due to edema. No LEE or PND/Orthopnea. Her appetite has been good lately and she tries to adhere to a low-salt diet.   HF Medications: Entresto 49-51 mg BID Digoxin 0.125 mg daily Furosemide 20 mg daily Potassium chloride 10 mEq daily  Has the patient been experiencing any side effects to the medications prescribed?  no  Does the patient have any problems obtaining medications due to transportation or finances?   No, has [place] medicaid for insurance  Understanding of regimen: good Understanding of indications: good Potential of compliance: good Patient understands to avoid NSAIDs. Patient understands to avoid decongestants.    Pertinent Lab Values (07/24/19): Marland Kitchen Serum creatinine 0.85, BUN 9, Potassium 4.3, Sodium 139, BNP 2,025 (07/06/19)  Vital Signs: . Weight: 194.4 lbs (last clinic weight: 193 lbs) . Blood pressure: 116/68  . Heart rate: 91   Assessment: 1. Acute systolic HF - EF 0000000 suspect peri-partum vs adriamycin toxicity (hard to know which one) - Symptoms improved with diuresis still very concerned for low output with prominent tachycardia  - NYHA Class II-III, euvolemic on exam. - She is not breast feeding - Continue furosemide 20 daily - Continue Entresto 49/51 mg BID - Initiate spironolactone 25 mg daily and stop potassium supplementation. Repeat BMET in 1 week.  - Continue digoxin 0.125 mg daily. Check level next visit.  - Discussed need for IUD or other forms of contraception  2. HTN  -Controlled at 116/68 today  - Initiate spironolactone and continue Entresto as above  3. Breast CA - s/p treatment with taxol and  adriamycin    Plan: 1) Medication changes: Based on clinical presentation, vital signs and recent labs will initiate spironolactone 25 mg daily and stop potassium supplementation. Repeat BMET in 1 week.  2) Follow-up: 2 weeks with Pharmacy Clinic & 7 weeks with Dr. Haroldine Laws  Kennon Holter, PharmD PGY1 Ambulatory Care Pharmacy  Resident  Audry Riles, PharmD, BCPS, [facility], CPP Heart Failure Clinic Pharmacist [phone]

## 2019-07-29 NOTE — Patient Instructions (Signed)
It was a pleasure seeing you today!  MEDICATIONS: -We are changing your medications today -Start spironolactone 25mg  once daily -Stop taking your potassium supplement. You can keep this at home though in case you need it in the future if your potassium level is low again.  -Call if you have questions about your medications.  NEXT APPOINTMENT: Return to clinic in 2 weeks with pharmacist.  In general, to take care of your heart failure: -Limit your fluid intake to 2 Liters (half-gallon) per day.   -Limit your salt intake to ideally 2-3 grams (2000-3000 mg) per day. -Weigh yourself daily and record, and bring that "weight diary" to your next appointment.  (Weight gain of 2-3 pounds in 1 day typically means fluid weight.) -The medications for your heart are to help your heart and help you live longer.   -Please contact us before stopping any of your heart medications.  Call the clinic at [phone] with questions or to reschedule future appointments.

## 2019-07-31 ENCOUNTER — Inpatient Hospital Stay: Payer: Medicaid Other

## 2019-07-31 ENCOUNTER — Other Ambulatory Visit: Payer: Self-pay | Admitting: Oncology

## 2019-07-31 ENCOUNTER — Other Ambulatory Visit: Payer: Self-pay

## 2019-07-31 VITALS — BP 95/75 | HR 87 | Temp 98.7°F | Resp 16

## 2019-07-31 DIAGNOSIS — Z95828 Presence of other vascular implants and grafts: Secondary | ICD-10-CM

## 2019-07-31 DIAGNOSIS — C50812 Malignant neoplasm of overlapping sites of left female breast: Secondary | ICD-10-CM

## 2019-07-31 DIAGNOSIS — Z170 Estrogen receptor positive status [ER+]: Secondary | ICD-10-CM

## 2019-07-31 DIAGNOSIS — Z5111 Encounter for antineoplastic chemotherapy: Secondary | ICD-10-CM | POA: Diagnosis not present

## 2019-07-31 LAB — CMP (CANCER CENTER ONLY)
ALT: 20 U/L (ref 0–44)
AST: 20 U/L (ref 15–41)
Albumin: 3.5 g/dL (ref 3.5–5.0)
Alkaline Phosphatase: 92 U/L (ref 38–126)
Anion gap: 9 (ref 5–15)
BUN: 9 mg/dL (ref 6–20)
CO2: 27 mmol/L (ref 22–32)
Calcium: 8.6 mg/dL — ABNORMAL LOW (ref 8.9–10.3)
Chloride: 106 mmol/L (ref 98–111)
Creatinine: 0.93 mg/dL (ref 0.44–1.00)
GFR, Est AFR Am: >60 mL/min (ref >60)
GFR, Estimated: >60 mL/min (ref >60)
Glucose, Bld: 102 mg/dL — ABNORMAL HIGH (ref 70–99)
Potassium: 4 mmol/L (ref 3.5–5.1)
Sodium: 142 mmol/L (ref 135–145)
Total Bilirubin: 0.3 mg/dL (ref 0.3–1.2)
Total Protein: 6.7 g/dL (ref 6.5–8.1)

## 2019-07-31 LAB — CBC WITH DIFFERENTIAL (CANCER CENTER ONLY)
Abs Immature Granulocytes: 0.02 10*3/uL (ref 0.00–0.07)
Basophils Absolute: 0 10*3/uL (ref 0.0–0.1)
Basophils Relative: 1 %
Eosinophils Absolute: 0.1 10*3/uL (ref 0.0–0.5)
Eosinophils Relative: 3 %
HCT: 33.6 % — ABNORMAL LOW (ref 36.0–46.0)
Hemoglobin: 10.8 g/dL — ABNORMAL LOW (ref 12.0–15.0)
Immature Granulocytes: 1 %
Lymphocytes Relative: 49 %
Lymphs Abs: 1.6 10*3/uL (ref 0.7–4.0)
MCH: 30 pg (ref 26.0–34.0)
MCHC: 32.1 g/dL (ref 30.0–36.0)
MCV: 93.3 fL (ref 80.0–100.0)
Monocytes Absolute: 0.3 10*3/uL (ref 0.1–1.0)
Monocytes Relative: 8 %
Neutro Abs: 1.3 10*3/uL — ABNORMAL LOW (ref 1.7–7.7)
Neutrophils Relative %: 38 %
Platelet Count: 235 10*3/uL (ref 150–400)
RBC: 3.6 MIL/uL — ABNORMAL LOW (ref 3.87–5.11)
RDW: 15.4 % (ref 11.5–15.5)
WBC Count: 3.3 10*3/uL — ABNORMAL LOW (ref 4.0–10.5)
nRBC: 0 % (ref 0.0–0.2)

## 2019-07-31 MED ORDER — DIPHENHYDRAMINE HCL 50 MG/ML IJ SOLN
25.0000 mg | Freq: Once | INTRAMUSCULAR | Status: AC
  Administered 2019-07-31: 25 mg via INTRAVENOUS

## 2019-07-31 MED ORDER — SODIUM CHLORIDE 0.9 % IV SOLN
Freq: Once | INTRAVENOUS | Status: AC
  Filled 2019-07-31: qty 250

## 2019-07-31 MED ORDER — SODIUM CHLORIDE 0.9% FLUSH
10.0000 mL | INTRAVENOUS | Status: DC | PRN
  Administered 2019-07-31: 10 mL
  Filled 2019-07-31: qty 10

## 2019-07-31 MED ORDER — FAMOTIDINE IN NACL 20-0.9 MG/50ML-% IV SOLN
INTRAVENOUS | Status: AC
  Filled 2019-07-31: qty 50

## 2019-07-31 MED ORDER — SODIUM CHLORIDE 0.9% FLUSH
10.0000 mL | Freq: Once | INTRAVENOUS | Status: AC
  Administered 2019-07-31: 10 mL
  Filled 2019-07-31: qty 10

## 2019-07-31 MED ORDER — DIPHENHYDRAMINE HCL 50 MG/ML IJ SOLN
INTRAMUSCULAR | Status: AC
  Filled 2019-07-31: qty 1

## 2019-07-31 MED ORDER — SODIUM CHLORIDE 0.9 % IV SOLN
65.0000 mg/m2 | Freq: Once | INTRAVENOUS | Status: AC
  Administered 2019-07-31: 132 mg via INTRAVENOUS
  Filled 2019-07-31: qty 22

## 2019-07-31 MED ORDER — HEPARIN SOD (PORK) LOCK FLUSH 100 UNIT/ML IV SOLN
500.0000 [IU] | Freq: Once | INTRAVENOUS | Status: AC | PRN
  Administered 2019-07-31: 500 [IU]
  Filled 2019-07-31: qty 5

## 2019-07-31 MED ORDER — FAMOTIDINE IN NACL 20-0.9 MG/50ML-% IV SOLN
20.0000 mg | Freq: Once | INTRAVENOUS | Status: AC
  Administered 2019-07-31: 20 mg via INTRAVENOUS

## 2019-07-31 NOTE — Patient Instructions (Signed)
[facility] Discharge Instructions for Patients Receiving Chemotherapy  Today you received the following chemotherapy agents :  Taxol.  To help prevent nausea and vomiting after your treatment, we encourage you to take your nausea medication as prescribed.   If you develop nausea and vomiting that is not controlled by your nausea medication, call the clinic.   BELOW ARE SYMPTOMS THAT SHOULD BE REPORTED IMMEDIATELY:  *FEVER GREATER THAN 100.5 F  *CHILLS WITH OR WITHOUT FEVER  NAUSEA AND VOMITING THAT IS NOT CONTROLLED WITH YOUR NAUSEA MEDICATION  *UNUSUAL SHORTNESS OF BREATH  *UNUSUAL BRUISING OR BLEEDING  TENDERNESS IN MOUTH AND THROAT WITH OR WITHOUT PRESENCE OF ULCERS  *URINARY PROBLEMS  *BOWEL PROBLEMS  UNUSUAL RASH Items with * indicate a potential emergency and should be followed up as soon as possible.  Feel free to call the clinic should you have any questions or concerns. The clinic phone number is [phone].  Please show the CHEMO ALERT CARD at check-in to the Emergency Department and triage nurse.   

## 2019-07-31 NOTE — Progress Notes (Signed)
Received verbal okay from Dr. Lindi Adie to treat patient with an [place] of 1.3.

## 2019-08-03 NOTE — Progress Notes (Signed)
Pharmacist Chemotherapy Monitoring - Follow Up Assessment    I verify that I have reviewed each item in the below checklist:  . Regimen for the patient is scheduled for the appropriate day and plan matches scheduled date. Marland Kitchen Appropriate non-routine labs are ordered dependent on drug ordered. . If applicable, additional medications reviewed and ordered per protocol based on lifetime cumulative doses and/or treatment regimen.   Plan for follow-up and/or issues identified: No . I-vent associated with next due treatment: No . MD and/or nursing notified: No  Cortnee Steinmiller D 08/03/2019 4:08 PM

## 2019-08-03 NOTE — Progress Notes (Signed)
Pharmacist Chemotherapy Monitoring - Follow Up Assessment    I verify that I have reviewed each item in the below checklist:  . Regimen for the patient is scheduled for the appropriate day and plan matches scheduled date. Marland Kitchen Appropriate non-routine labs are ordered dependent on drug ordered. . If applicable, additional medications reviewed and ordered per protocol based on lifetime cumulative doses and/or treatment regimen.   Plan for follow-up and/or issues identified: No . I-vent associated with next due treatment: No . MD and/or nursing notified: No  Melissa [name] 08/03/2019 4:09 PM

## 2019-08-06 ENCOUNTER — Encounter: Payer: Self-pay | Admitting: *Deleted

## 2019-08-06 ENCOUNTER — Telehealth: Payer: Self-pay | Admitting: Hematology and Oncology

## 2019-08-06 NOTE — Telephone Encounter (Signed)
Scheduled per 5/27 sch message. Noted to give pt updated calendar.

## 2019-08-07 ENCOUNTER — Inpatient Hospital Stay: Payer: Medicaid Other

## 2019-08-07 ENCOUNTER — Encounter: Payer: Self-pay | Admitting: Adult Health

## 2019-08-07 ENCOUNTER — Inpatient Hospital Stay (HOSPITAL_BASED_OUTPATIENT_CLINIC_OR_DEPARTMENT_OTHER): Payer: Medicaid Other | Admitting: Adult Health

## 2019-08-07 ENCOUNTER — Other Ambulatory Visit: Payer: Self-pay

## 2019-08-07 VITALS — BP 102/82 | HR 93 | Temp 98.5°F | Resp 17 | Ht 63.0 in | Wt 198.3 lb

## 2019-08-07 DIAGNOSIS — C50812 Malignant neoplasm of overlapping sites of left female breast: Secondary | ICD-10-CM

## 2019-08-07 DIAGNOSIS — Z170 Estrogen receptor positive status [ER+]: Secondary | ICD-10-CM

## 2019-08-07 DIAGNOSIS — Z5111 Encounter for antineoplastic chemotherapy: Secondary | ICD-10-CM | POA: Diagnosis not present

## 2019-08-07 LAB — CMP (CANCER CENTER ONLY)
ALT: 20 U/L (ref 0–44)
AST: 20 U/L (ref 15–41)
Albumin: 3.4 g/dL — ABNORMAL LOW (ref 3.5–5.0)
Alkaline Phosphatase: 82 U/L (ref 38–126)
Anion gap: 10 (ref 5–15)
BUN: 8 mg/dL (ref 6–20)
CO2: 24 mmol/L (ref 22–32)
Calcium: 8.6 mg/dL — ABNORMAL LOW (ref 8.9–10.3)
Chloride: 108 mmol/L (ref 98–111)
Creatinine: 0.78 mg/dL (ref 0.44–1.00)
GFR, Est AFR Am: >60 mL/min (ref >60)
GFR, Estimated: >60 mL/min (ref >60)
Glucose, Bld: 104 mg/dL — ABNORMAL HIGH (ref 70–99)
Potassium: 3.7 mmol/L (ref 3.5–5.1)
Sodium: 142 mmol/L (ref 135–145)
Total Bilirubin: 0.3 mg/dL (ref 0.3–1.2)
Total Protein: 6.4 g/dL — ABNORMAL LOW (ref 6.5–8.1)

## 2019-08-07 LAB — CBC WITH DIFFERENTIAL (CANCER CENTER ONLY)
Abs Immature Granulocytes: 0.04 10*3/uL (ref 0.00–0.07)
Basophils Absolute: 0 10*3/uL (ref 0.0–0.1)
Basophils Relative: 1 %
Eosinophils Absolute: 0 10*3/uL (ref 0.0–0.5)
Eosinophils Relative: 1 %
HCT: 33.4 % — ABNORMAL LOW (ref 36.0–46.0)
Hemoglobin: 10.7 g/dL — ABNORMAL LOW (ref 12.0–15.0)
Immature Granulocytes: 1 %
Lymphocytes Relative: 43 %
Lymphs Abs: 1.9 10*3/uL (ref 0.7–4.0)
MCH: 30.1 pg (ref 26.0–34.0)
MCHC: 32 g/dL (ref 30.0–36.0)
MCV: 94.1 fL (ref 80.0–100.0)
Monocytes Absolute: 0.3 10*3/uL (ref 0.1–1.0)
Monocytes Relative: 8 %
Neutro Abs: 2.1 10*3/uL (ref 1.7–7.7)
Neutrophils Relative %: 46 %
Platelet Count: 242 10*3/uL (ref 150–400)
RBC: 3.55 MIL/uL — ABNORMAL LOW (ref 3.87–5.11)
RDW: 15.8 % — ABNORMAL HIGH (ref 11.5–15.5)
WBC Count: 4.3 10*3/uL (ref 4.0–10.5)
nRBC: 0 % (ref 0.0–0.2)

## 2019-08-07 MED ORDER — HEPARIN SOD (PORK) LOCK FLUSH 100 UNIT/ML IV SOLN
500.0000 [IU] | Freq: Once | INTRAVENOUS | Status: AC | PRN
  Administered 2019-08-07: 500 [IU]
  Filled 2019-08-07: qty 5

## 2019-08-07 MED ORDER — SODIUM CHLORIDE 0.9 % IV SOLN
65.0000 mg/m2 | Freq: Once | INTRAVENOUS | Status: AC
  Administered 2019-08-07: 132 mg via INTRAVENOUS
  Filled 2019-08-07: qty 22

## 2019-08-07 MED ORDER — SODIUM CHLORIDE 0.9 % IV SOLN
Freq: Once | INTRAVENOUS | Status: AC
  Filled 2019-08-07: qty 250

## 2019-08-07 MED ORDER — DIPHENHYDRAMINE HCL 50 MG/ML IJ SOLN
25.0000 mg | Freq: Once | INTRAMUSCULAR | Status: AC
  Administered 2019-08-07: 25 mg via INTRAVENOUS

## 2019-08-07 MED ORDER — SODIUM CHLORIDE 0.9% FLUSH
10.0000 mL | INTRAVENOUS | Status: DC | PRN
  Administered 2019-08-07: 10 mL
  Filled 2019-08-07: qty 10

## 2019-08-07 MED ORDER — FAMOTIDINE IN NACL 20-0.9 MG/50ML-% IV SOLN
20.0000 mg | Freq: Once | INTRAVENOUS | Status: AC
  Administered 2019-08-07: 20 mg via INTRAVENOUS

## 2019-08-07 MED ORDER — DIPHENHYDRAMINE HCL 50 MG/ML IJ SOLN
INTRAMUSCULAR | Status: AC
  Filled 2019-08-07: qty 1

## 2019-08-07 MED ORDER — FAMOTIDINE IN NACL 20-0.9 MG/50ML-% IV SOLN
INTRAVENOUS | Status: AC
  Filled 2019-08-07: qty 50
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
## 2019-08-07 NOTE — Patient Instructions (Signed)
Cancer Center Discharge Instructions for Patients Receiving Chemotherapy  Today you received the following chemotherapy agents :  Taxol.  To help prevent nausea and vomiting after your treatment, we encourage you to take your nausea medication as prescribed.   If you develop nausea and vomiting that is not controlled by your nausea medication, call the clinic.   BELOW ARE SYMPTOMS THAT SHOULD BE REPORTED IMMEDIATELY:  *FEVER GREATER THAN 100.5 F  *CHILLS WITH OR WITHOUT FEVER  NAUSEA AND VOMITING THAT IS NOT CONTROLLED WITH YOUR NAUSEA MEDICATION  *UNUSUAL SHORTNESS OF BREATH  *UNUSUAL BRUISING OR BLEEDING  TENDERNESS IN MOUTH AND THROAT WITH OR WITHOUT PRESENCE OF ULCERS  *URINARY PROBLEMS  *BOWEL PROBLEMS  UNUSUAL RASH Items with * indicate a potential emergency and should be followed up as soon as possible.  Feel free to call the clinic should you have any questions or concerns. The clinic phone number is [phone].  Please show the CHEMO ALERT CARD at check-in to the Emergency Department and triage nurse.   

## 2019-08-08 ENCOUNTER — Encounter: Payer: Self-pay | Admitting: Adult Health

## 2019-08-11 ENCOUNTER — Inpatient Hospital Stay (HOSPITAL_COMMUNITY)
Admission: RE | Admit: 2019-08-11 | Discharge: 2019-08-11 | Disposition: A | Discharge status: Home / Self Care | Payer: Medicaid Other | Source: Ambulatory Visit

## 2019-08-11 ENCOUNTER — Encounter: Payer: Self-pay | Admitting: Licensed Clinical Social Worker
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
## 2019-08-14 ENCOUNTER — Encounter: Payer: Self-pay | Admitting: *Deleted

## 2019-08-14 ENCOUNTER — Inpatient Hospital Stay: Payer: Medicaid Other | Attending: Hematology and Oncology

## 2019-08-14 ENCOUNTER — Inpatient Hospital Stay: Payer: Medicaid Other

## 2019-08-14 ENCOUNTER — Other Ambulatory Visit: Payer: Self-pay

## 2019-08-14 ENCOUNTER — Other Ambulatory Visit: Payer: Self-pay | Admitting: Oncology

## 2019-08-14 VITALS — BP 110/67 | HR 76 | Temp 98.5°F | Resp 16

## 2019-08-14 DIAGNOSIS — C50812 Malignant neoplasm of overlapping sites of left female breast: Secondary | ICD-10-CM | POA: Diagnosis present

## 2019-08-14 DIAGNOSIS — Z5111 Encounter for antineoplastic chemotherapy: Secondary | ICD-10-CM | POA: Diagnosis not present

## 2019-08-14 DIAGNOSIS — C773 Secondary and unspecified malignant neoplasm of axilla and upper limb lymph nodes: Secondary | ICD-10-CM | POA: Insufficient documentation

## 2019-08-14 DIAGNOSIS — Z170 Estrogen receptor positive status [ER+]: Secondary | ICD-10-CM | POA: Diagnosis not present

## 2019-08-14 DIAGNOSIS — T451X5A Adverse effect of antineoplastic and immunosuppressive drugs, initial encounter: Secondary | ICD-10-CM | POA: Insufficient documentation

## 2019-08-14 DIAGNOSIS — D6481 Anemia due to antineoplastic chemotherapy: Secondary | ICD-10-CM | POA: Diagnosis not present

## 2019-08-14 DIAGNOSIS — Z79899 Other long term (current) drug therapy: Secondary | ICD-10-CM | POA: Insufficient documentation

## 2019-08-14 DIAGNOSIS — Z9221 Personal history of antineoplastic chemotherapy: Secondary | ICD-10-CM | POA: Diagnosis not present

## 2019-08-14 DIAGNOSIS — Z95828 Presence of other vascular implants and grafts: Secondary | ICD-10-CM

## 2019-08-14 LAB — CBC WITH DIFFERENTIAL (CANCER CENTER ONLY)
Abs Immature Granulocytes: 0.06 10*3/uL (ref 0.00–0.07)
Basophils Absolute: 0 10*3/uL (ref 0.0–0.1)
Basophils Relative: 1 %
Eosinophils Absolute: 0.1 10*3/uL (ref 0.0–0.5)
Eosinophils Relative: 1 %
HCT: 33.3 % — ABNORMAL LOW (ref 36.0–46.0)
Hemoglobin: 10.7 g/dL — ABNORMAL LOW (ref 12.0–15.0)
Immature Granulocytes: 1 %
Lymphocytes Relative: 34 %
Lymphs Abs: 1.6 10*3/uL (ref 0.7–4.0)
MCH: 29.8 pg (ref 26.0–34.0)
MCHC: 32.1 g/dL (ref 30.0–36.0)
MCV: 92.8 fL (ref 80.0–100.0)
Monocytes Absolute: 0.3 10*3/uL (ref 0.1–1.0)
Monocytes Relative: 7 %
Neutro Abs: 2.7 10*3/uL (ref 1.7–7.7)
Neutrophils Relative %: 56 %
Platelet Count: 216 10*3/uL (ref 150–400)
RBC: 3.59 MIL/uL — ABNORMAL LOW (ref 3.87–5.11)
RDW: 16.1 % — ABNORMAL HIGH (ref 11.5–15.5)
WBC Count: 4.7 10*3/uL (ref 4.0–10.5)
nRBC: 0 % (ref 0.0–0.2)

## 2019-08-14 LAB — CMP (CANCER CENTER ONLY)
ALT: 17 U/L (ref 0–44)
AST: 16 U/L (ref 15–41)
Albumin: 3.5 g/dL (ref 3.5–5.0)
Alkaline Phosphatase: 77 U/L (ref 38–126)
Anion gap: 9 (ref 5–15)
BUN: 11 mg/dL (ref 6–20)
CO2: 25 mmol/L (ref 22–32)
Calcium: 8.6 mg/dL — ABNORMAL LOW (ref 8.9–10.3)
Chloride: 106 mmol/L (ref 98–111)
Creatinine: 0.87 mg/dL (ref 0.44–1.00)
GFR, Est AFR Am: >60 mL/min (ref >60)
GFR, Estimated: >60 mL/min (ref >60)
Glucose, Bld: 109 mg/dL — ABNORMAL HIGH (ref 70–99)
Potassium: 3.7 mmol/L (ref 3.5–5.1)
Sodium: 140 mmol/L (ref 135–145)
Total Bilirubin: 0.3 mg/dL (ref 0.3–1.2)
Total Protein: 6.6 g/dL (ref 6.5–8.1)

## 2019-08-14 MED ORDER — DIPHENHYDRAMINE HCL 50 MG/ML IJ SOLN
INTRAMUSCULAR | Status: AC
  Filled 2019-08-14: qty 1

## 2019-08-14 MED ORDER — SODIUM CHLORIDE 0.9% FLUSH
10.0000 mL | Freq: Once | INTRAVENOUS | Status: DC
  Filled 2019-08-14: qty 10

## 2019-08-14 MED ORDER — FAMOTIDINE IN NACL 20-0.9 MG/50ML-% IV SOLN
INTRAVENOUS | Status: AC
  Filled 2019-08-14: qty 50

## 2019-08-14 MED ORDER — HEPARIN SOD (PORK) LOCK FLUSH 100 UNIT/ML IV SOLN
500.0000 [IU] | Freq: Once | INTRAVENOUS | Status: AC | PRN
  Administered 2019-08-14: 500 [IU]
  Filled 2019-08-14: qty 5

## 2019-08-14 MED ORDER — DIPHENHYDRAMINE HCL 50 MG/ML IJ SOLN
25.0000 mg | Freq: Once | INTRAMUSCULAR | Status: AC
  Administered 2019-08-14: 25 mg via INTRAVENOUS

## 2019-08-14 MED ORDER — SODIUM CHLORIDE 0.9 % IV SOLN
65.0000 mg/m2 | Freq: Once | INTRAVENOUS | Status: AC
  Administered 2019-08-14: 132 mg via INTRAVENOUS
  Filled 2019-08-14: qty 22

## 2019-08-14 MED ORDER — SODIUM CHLORIDE 0.9 % IV SOLN
Freq: Once | INTRAVENOUS | Status: AC
  Filled 2019-08-14: qty 250

## 2019-08-14 MED ORDER — SODIUM CHLORIDE 0.9% FLUSH
10.0000 mL | INTRAVENOUS | Status: DC | PRN
  Administered 2019-08-14: 10 mL
  Filled 2019-08-14: qty 10

## 2019-08-14 MED ORDER — FAMOTIDINE IN NACL 20-0.9 MG/50ML-% IV SOLN
20.0000 mg | Freq: Once | INTRAVENOUS | Status: AC
  Administered 2019-08-14: 20 mg via INTRAVENOUS

## 2019-08-14 NOTE — Patient Instructions (Signed)
[facility] Discharge Instructions for Patients Receiving Chemotherapy  Today you received the following chemotherapy agents: paclitaxel.  To help prevent nausea and vomiting after your treatment, we encourage you to take your nausea medication as directed.   If you develop nausea and vomiting that is not controlled by your nausea medication, call the clinic.   BELOW ARE SYMPTOMS THAT SHOULD BE REPORTED IMMEDIATELY:  *FEVER GREATER THAN 100.5 F  *CHILLS WITH OR WITHOUT FEVER  NAUSEA AND VOMITING THAT IS NOT CONTROLLED WITH YOUR NAUSEA MEDICATION  *UNUSUAL SHORTNESS OF BREATH  *UNUSUAL BRUISING OR BLEEDING  TENDERNESS IN MOUTH AND THROAT WITH OR WITHOUT PRESENCE OF ULCERS  *URINARY PROBLEMS  *BOWEL PROBLEMS  UNUSUAL RASH Items with * indicate a potential emergency and should be followed up as soon as possible.  Feel free to call the clinic should you have any questions or concerns. The clinic phone number is [phone].  Please show the CHEMO ALERT CARD at check-in to the Emergency Department and triage nurse.   

## 2019-08-20 NOTE — Progress Notes (Signed)
Patient Care Team: Patient, [name] (General Practice) [name], RN as Oncology Nurse Navigator Rockwell Germany, RN as Oncology Nurse Navigator Rolm Bookbinder, MD as Surgeon (General Surgery) Nicholas Lose, MD as Medical Oncologist (Hematology and Oncology)  DIAGNOSIS:    ICD-10-CM   1. 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 ER 50%, PR 30%, Ki-67 50%, HER-2 negative ratio 1.03   03/04/2019 Cancer Staging   Staging form: Breast, AJCC 8th Edition - Clinical stage from 03/04/2019: Stage IIIB (cT4, cN1, cM0, G3, ER+, PR+, HER2-) - Signed by GNicholas Lose MD on 03/04/2019    Neo-Adjuvant Chemotherapy   Adriamycin and Cytoxan x 4 given every three weeks without Neulasta support., followed by weekly Taxol x 12 versus Taxotere Cytoxan after her delivery   04/15/2019 Genetic Testing   Negative genetic testing:  No pathogenic variants detected on the [employer] Breast Cancer Guidelines-Based Panel or the Common Hereditary Cancers Panel. The report date is 04/15/2019.  The Breast Cancer Guidelines-Based panel offered by [facility] includes sequencing and rearrangement analysis for the following 11 genes:  ATM, BRCA1, BRCA2, CDH1, CHEK2, NBN, NF1, PALB2, PTEN, STK11 and TP53.  The Common Hereditary Cancers Panel offered by [facility] includes sequencing and/or deletion duplication testing of the  following 48 genes: APC, ATM, AXIN2, BARD1, BMPR1A, BRCA1, BRCA2, BRIP1, CDH1, CDK4, CDKN2A (p14ARF), CDKN2A (p16INK4a), CHEK2, CTNNA1, DICER1, EPCAM (Deletion/duplication testing only), GREM1 (promoter region deletion/duplication testing only), KIT, MEN1, MLH1, MSH2, MSH3, MSH6, MUTYH, NBN, NF1, NHTL1, PALB2, PDGFRA, PMS2, POLD1, POLE, PTEN, RAD50, RAD51C, RAD51D, RNF43, SDHB, SDHC, SDHD, SMAD4, SMARCA4. STK11, TP53, TSC1, TSC2, and VHL.  The following genes were evaluated for sequence changes only: SDHA and HOXB13 c.251G>A variant only.      CHIEF COMPLIANT: Cycle8Taxol  INTERVAL HISTORY: Melissa [name] a 29y.o. with above-mentioned history of left breast cancerwho completed 4 cycles ofneoadjuvant chemotherapy with dose dense Adriamycin and Cytoxan,delivered her baby, and is currently on weekly Taxol. She presents to the clinic todayfor cycle8.   ALLERGIES:  has No Known Allergies.  MEDICATIONS:  Current Outpatient Medications  Medication Sig Dispense Refill  . acetaminophen (TYLENOL) 500 MG tablet Take 500 mg by mouth every 6 (six) hours as needed.      . digoxin (LANOXIN) 0.125 MG tablet Take 1 tablet (125 mcg total) by mouth daily. 30 tablet 11  . furosemide (LASIX) 20 MG tablet Take 1 tablet (20 mg total) by mouth daily. May take an additional tablet daily as needed. 60 tablet 1  . lidocaine-prilocaine (EMLA) cream APPLY 1 APPLICATION TOPICALLY ONCE FOR 1 DOSE.    . sacubitril-valsartan (ENTRESTO) 49-51 MG Take 1 tablet by mouth 2 (two) times daily. 60 tablet 3  . spironolactone (ALDACTONE) 25 MG tablet Take 1 tablet (25 mg total) by mouth daily. 90 tablet 3   No current facility-administered medications for this visit.    PHYSICAL EXAMINATION: ECOG PERFORMANCE STATUS: 1 - Symptomatic  but completely ambulatory  Vitals:   08/21/19 1006  BP: 115/75  Pulse: 82  Resp: 17  Temp: 98.3 F (36.8 C)  SpO2: 100%   Filed Weights   08/21/19 1006  Weight: 197 lb 11.2 oz (89.7  kg)    LABORATORY DATA:  I have reviewed the data as listed CMP Latest Ref Rng & Units 08/14/2019 08/07/2019 07/31/2019  Glucose 70 - 99 mg/dL 109(H) 104(H) 102(H)  BUN 6 - 20 mg/dL '11 8 9  '$ Creatinine 0.44 - 1.00 mg/dL 0.87 0.78 0.93  Sodium 135 - 145 mmol/L 140 142 142  Potassium 3.5 - 5.1 mmol/L 3.7 3.7 4.0  Chloride 98 - 111 mmol/L 106 108 106  CO2 22 - 32 mmol/L '25 24 27  '$ Calcium 8.9 - 10.3 mg/dL 8.6(L) 8.6(L) 8.6(L)  Total Protein 6.5 - 8.1 g/dL 6.6 6.4(L) 6.7  Total Bilirubin 0.3 - 1.2 mg/dL 0.3 0.3 0.3  Alkaline Phos 38 - 126 U/L 77 82 92  AST 15 - 41 U/L '16 20 20  '$ ALT 0 - 44 U/L '17 20 20    '$ Lab Results  Component Value Date   WBC 4.8 08/21/2019   HGB 11.0 (L) 08/21/2019   HCT 34.2 (L) 08/21/2019   MCV 94.0 08/21/2019   PLT 214 08/21/2019   NEUTROABS PENDING 08/21/2019    ASSESSMENT & PLAN:  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 Neoadjuvant chemotherapy with dose dense Adriamycin and Cytoxan given every 3 weeks without Neulasta support given her pregnancycompleted 05/28/2019 2. Post-Partum weekly Taxol x12starting 07/03/2019 3. Followed by mastectomy and targeted node dissection versus full axillary lymph node dissection depending on the response. 4. Adjuvant radiation therapy 5. Followed by adjuvant antiestrogen therapy with complete estrogen blockade (ovarian suppression with  AI) CT C/A/P on 5/13 shows sclerotic bone lesions, bone scan negative 6. 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 BHaroldine Lawsstarted her on Entresto and digoxin. Also on Lasix once a day.Continuing to show improvement in symptoms. 2.Chemo induced anemia: Today's hemoglobin is 11 3.  Occasional mild tingling of the fingers: We do not need to make any changes to treatment at this time.  Return to clinic weekly for Taxol every other week for follow-up with me. After cycle 12 we will need to arrange for breast MRI.  No orders of the defined types were placed in this encounter.  The patient has a good understanding of the overall plan. she agrees with it. she will call with any problems that may develop before the next visit here.  Total time spent: 30 mins including face to face time and time spent for planning, charting and coordination of care  GNicholas Lose MD 08/21/2019  I, MCloyde ReamsDorshimer, am acting as scribe for Dr. VNicholas Lose  I have reviewed the above documentation for accuracy and completeness, and I agree with the above.

## 2019-08-21 ENCOUNTER — Other Ambulatory Visit: Payer: Self-pay | Admitting: Hematology and Oncology

## 2019-08-21 ENCOUNTER — Inpatient Hospital Stay: Payer: Medicaid Other

## 2019-08-21 ENCOUNTER — Other Ambulatory Visit: Payer: Self-pay

## 2019-08-21 ENCOUNTER — Encounter: Payer: Self-pay | Admitting: *Deleted

## 2019-08-21 ENCOUNTER — Inpatient Hospital Stay (HOSPITAL_BASED_OUTPATIENT_CLINIC_OR_DEPARTMENT_OTHER): Payer: Medicaid Other | Admitting: Hematology and Oncology

## 2019-08-21 DIAGNOSIS — C50812 Malignant neoplasm of overlapping sites of left female breast: Secondary | ICD-10-CM

## 2019-08-21 DIAGNOSIS — Z170 Estrogen receptor positive status [ER+]: Secondary | ICD-10-CM | POA: Diagnosis not present

## 2019-08-21 DIAGNOSIS — Z95828 Presence of other vascular implants and grafts: Secondary | ICD-10-CM

## 2019-08-21 DIAGNOSIS — Z5111 Encounter for antineoplastic chemotherapy: Secondary | ICD-10-CM | POA: Diagnosis not present

## 2019-08-21 LAB — CBC WITH DIFFERENTIAL (CANCER CENTER ONLY)
Abs Immature Granulocytes: 0.05 10*3/uL (ref 0.00–0.07)
Basophils Absolute: 0.1 10*3/uL (ref 0.0–0.1)
Basophils Relative: 1 %
Eosinophils Absolute: 0.1 10*3/uL (ref 0.0–0.5)
Eosinophils Relative: 2 %
HCT: 34.2 % — ABNORMAL LOW (ref 36.0–46.0)
Hemoglobin: 11 g/dL — ABNORMAL LOW (ref 12.0–15.0)
Immature Granulocytes: 1 %
Lymphocytes Relative: 41 %
Lymphs Abs: 2 10*3/uL (ref 0.7–4.0)
MCH: 30.2 pg (ref 26.0–34.0)
MCHC: 32.2 g/dL (ref 30.0–36.0)
MCV: 94 fL (ref 80.0–100.0)
Monocytes Absolute: 0.4 10*3/uL (ref 0.1–1.0)
Monocytes Relative: 8 %
Neutro Abs: 2.3 10*3/uL (ref 1.7–7.7)
Neutrophils Relative %: 47 %
Platelet Count: 214 10*3/uL (ref 150–400)
RBC: 3.64 MIL/uL — ABNORMAL LOW (ref 3.87–5.11)
RDW: 16.5 % — ABNORMAL HIGH (ref 11.5–15.5)
WBC Count: 4.8 10*3/uL (ref 4.0–10.5)
nRBC: 0 % (ref 0.0–0.2)

## 2019-08-21 LAB — CMP (CANCER CENTER ONLY)
ALT: 14 U/L (ref 0–44)
AST: 16 U/L (ref 15–41)
Albumin: 3.8 g/dL (ref 3.5–5.0)
Alkaline Phosphatase: 76 U/L (ref 38–126)
Anion gap: 10 (ref 5–15)
BUN: 9 mg/dL (ref 6–20)
CO2: 26 mmol/L (ref 22–32)
Calcium: 8.9 mg/dL (ref 8.9–10.3)
Chloride: 106 mmol/L (ref 98–111)
Creatinine: 0.82 mg/dL (ref 0.44–1.00)
GFR, Est AFR Am: >60 mL/min (ref >60)
GFR, Estimated: >60 mL/min (ref >60)
Glucose, Bld: 90 mg/dL (ref 70–99)
Potassium: 3.6 mmol/L (ref 3.5–5.1)
Sodium: 142 mmol/L (ref 135–145)
Total Bilirubin: 0.4 mg/dL (ref 0.3–1.2)
Total Protein: 6.7 g/dL (ref 6.5–8.1)

## 2019-08-21 MED ORDER — FAMOTIDINE IN NACL 20-0.9 MG/50ML-% IV SOLN
INTRAVENOUS | Status: AC
  Filled 2019-08-21: qty 50

## 2019-08-21 MED ORDER — FAMOTIDINE IN NACL 20-0.9 MG/50ML-% IV SOLN
20.0000 mg | Freq: Once | INTRAVENOUS | Status: AC
  Administered 2019-08-21: 20 mg via INTRAVENOUS

## 2019-08-21 MED ORDER — HEPARIN SOD (PORK) LOCK FLUSH 100 UNIT/ML IV SOLN
500.0000 [IU] | Freq: Once | INTRAVENOUS | Status: AC | PRN
  Administered 2019-08-21: 500 [IU]
  Filled 2019-08-21: qty 5

## 2019-08-21 MED ORDER — SODIUM CHLORIDE 0.9 % IV SOLN
65.0000 mg/m2 | Freq: Once | INTRAVENOUS | Status: AC
  Administered 2019-08-21: 132 mg via INTRAVENOUS
  Filled 2019-08-21: qty 22

## 2019-08-21 MED ORDER — SODIUM CHLORIDE 0.9% FLUSH
10.0000 mL | Freq: Once | INTRAVENOUS | Status: AC
  Administered 2019-08-21: 10 mL
  Filled 2019-08-21: qty 10

## 2019-08-21 MED ORDER — SODIUM CHLORIDE 0.9 % IV SOLN
Freq: Once | INTRAVENOUS | Status: AC
  Filled 2019-08-21: qty 250

## 2019-08-21 MED ORDER — SODIUM CHLORIDE 0.9% FLUSH
10.0000 mL | INTRAVENOUS | Status: DC | PRN
  Administered 2019-08-21: 10 mL
  Filled 2019-08-21: qty 10

## 2019-08-21 MED ORDER — DIPHENHYDRAMINE HCL 50 MG/ML IJ SOLN
25.0000 mg | Freq: Once | INTRAMUSCULAR | Status: AC
  Administered 2019-08-21: 25 mg via INTRAVENOUS

## 2019-08-21 MED ORDER — DIPHENHYDRAMINE HCL 50 MG/ML IJ SOLN
INTRAMUSCULAR | Status: AC
  Filled 2019-08-21: qty 1

## 2019-08-21 NOTE — Patient Instructions (Signed)

## 2019-08-21 NOTE — Assessment & Plan Note (Signed)
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 Neoadjuvant chemotherapy with dose dense Adriamycin and Cytoxan given every 3 weeks without Neulasta support given her pregnancycompleted 05/28/2019 2. Post-Partum weekly Taxol x12starting 07/03/2019 3. Followed by mastectomy and targeted node dissection versus full axillary lymph node dissection depending on the response. 4. Adjuvant radiation therapy 5. Followed by adjuvant antiestrogen therapy with complete estrogen blockade (ovarian suppression with AI) CT C/A/P on 5/13 shows sclerotic bone lesions, bone scan negative 6. 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 BHaroldine Lawsstarted her on Entresto and digoxin. Also on Lasix once a day.Continuing to show improvement in symptoms. 2.Chemo induced anemia: Resolved  Return to clinic weekly for Taxol every other week for follow-up with me. After cycle 12 we will need to arrange for breast MRI.

## 2019-08-24 NOTE — Progress Notes (Signed)
Referring Physician: Lindi Adie Primary Cardiologist: Dr. Haroldine Laws  HPI:  Melissa [name] is 29 y.o. female with left breast cancer referred by Dr. Lindi Adie for enrollment into the Cardio-Oncology program due to new-onset cardiomyopathy.  She denied any h/o HTN or heart problems. Diagnosed with left breast CA in 02/2019 when she was 4 months pregnant with her second child. Her first daughter was born at 65 weeks in November 2019. No other complications except pre-term labor.  Echo 03/17/19: EF 60% RV normal   While pregnant she was treated with 4 cycles ofneoadjuvant chemotherapy with dose dense Adriamycin and Cytoxan. She delivered her baby girl on 06/19/19. She did not have problems with HTN or pre-eclampsia.   She presented to the ED on 06/27/19 for lower extremity edema and shortness of breath. CT was negative for PE and she was discharged with 5 days of furosemide 20 mg. Echo on 07/02/19 showed a severely decreased ejection fraction, <20%.   ECHO 07/02/19 EF 15-20%. RV moderately reduced.    Recently presented to HF Clinic on 07/06/19 with Dr. Haroldine Laws. Remained on furosemide 20mg  BID. Weight down 220 lbs-> 205 lbs. Felt much better. Could do ADLs without much problem. Still SOB with going up steps. Orthopnea and PND had resolved. Mild ankle edema.    Recently returned to HF clinic for pharmacist medication titration on 07/29/19. She was feeling well symptomatically. She denied dizziness, lightheadedness, and fatigue. No chest pain or palpitations. Her SOB was improving; she could walk about a mile without SOB. She does not check her weight at home. Weight was stable from last visit. She had only been taking furosemide 20mg  daily since last HF clinic visit as instructed. She had not needed any additional doses of the furosemide. No LEE or PND/Orthopnea. Her appetite had been good lately and she reported trying to adhere to a low-salt diet.  Today she returns to HF clinic for pharmacist medication  titration. At recent visits to clinic, Entresto 49/51 mg BID, digoxin 0.125 mg daily and spironolactone 25 mg daily were initiated. Furosemide was decreased to 20 mg daily and potassium supplements were discontinued. Overall she is feeling well today. No dizziness, lightheadedness, chest pain or palpitations. She noted some difficulty breathing a few weeks ago after not taking her medications for 3 days but this has resolved since she restarted her medications. Has not missed any doses since that time. She takes furosemide 20 mg daily and has not needed any extra. No LEE, PND or orthopnea since resuming furosemide. Weight is down 4 lbs from last clinic visit. She does not weigh herself at home.    HF Medications: Entresto 49-51 mg BID Digoxin 0.125 mg daily Spironolactone 25 mg daily Furosemide 20 mg daily Potassium chloride 10 mEq daily  Has the patient been experiencing any side effects to the medications prescribed?  no  Does the patient have any problems obtaining medications due to transportation or finances?   No, has [place] medicaid for insurance  Understanding of regimen: good Understanding of indications: good Potential of compliance: good Patient understands to avoid NSAIDs. Patient understands to avoid decongestants.    Pertinent Lab Values (09/04/19): Marland Kitchen Serum creatinine 0.94, BUN 12, Potassium 3.6, Sodium 143, BNP 2,025 (07/06/19)  Vital Signs: . Weight: 190.2 lbs (last clinic weight: 194.4 lbs) . Blood pressure: 126/64 . Heart rate: 49   Assessment: 1. Acute systolic HF - EF 61-60% suspect peri-partum vs adriamycin toxicity (hard to know which one). - NYHA Class II-III, euvolemic on exam. - She is  not breast feeding.  - Continue furosemide 20 daily - Increase Entresto to 97/103 mg BID. Repeat BMET next week. - Continue spironolactone 25 mg daily  - Continue digoxin 0.125 mg daily.  - Discussed need for IUD or other forms of contraception  2. HTN  -Controlled at 126/64  today  - Increase Entresto as above. Continue spironolactone.   3. Breast CA - s/p treatment with taxol and adriamycin    Plan: 1) Medication changes: Based on clinical presentation, vital signs and recent labs will increase Entresto to 97/103 mg BID.  2) Follow-up:1 week with Dr. Haroldine Laws with echo.    Audry Riles, PharmD, BCPS, BCCP, CPP Heart Failure Clinic Pharmacist [phone]

## 2019-08-25 ENCOUNTER — Encounter: Payer: Self-pay | Admitting: Licensed Clinical Social Worker

## 2019-08-25 NOTE — Progress Notes (Signed)
[place] CSW Progress Note  Clinical Education officer, museum contacted patient by phone to provide ongoing emotional support. Amazing reports doing well this week. She is taking treatment day by day and has had few side effects. She is anxious to get repeat scans done after tx to see the progress. There have not been any issues with her children's father since we last spoke. He is respecting the 82B which is active until March 2020. She has been told by her lawyer to get a formal custody arrangement in place before it expires, but does not feel she has the bandwidth to do so right now.  Otherwise, she is focusing on enjoying her time with her kids and doing enjoyable activities like fishing.   CSW will continue to offer support phone calls every 2 weeks.   Edwinna Areola Emagene Merfeld , LCSW

## 2019-08-27 ENCOUNTER — Encounter: Payer: Self-pay | Admitting: *Deleted

## 2019-08-27 ENCOUNTER — Telehealth: Payer: Self-pay | Admitting: *Deleted

## 2019-08-27 NOTE — Telephone Encounter (Signed)
Called pt and left vm with appt date and time to see Dr. Iran Planas on 7/8 at 1120. Contact information provided for questions or needs.

## 2019-08-28 ENCOUNTER — Other Ambulatory Visit: Payer: Self-pay

## 2019-08-28 ENCOUNTER — Inpatient Hospital Stay: Payer: Medicaid Other

## 2019-08-28 VITALS — BP 112/77 | HR 86 | Temp 98.4°F | Resp 16 | Wt 198.0 lb

## 2019-08-28 DIAGNOSIS — C50812 Malignant neoplasm of overlapping sites of left female breast: Secondary | ICD-10-CM

## 2019-08-28 DIAGNOSIS — Z170 Estrogen receptor positive status [ER+]: Secondary | ICD-10-CM

## 2019-08-28 DIAGNOSIS — Z95828 Presence of other vascular implants and grafts: Secondary | ICD-10-CM

## 2019-08-28 DIAGNOSIS — Z5111 Encounter for antineoplastic chemotherapy: Secondary | ICD-10-CM | POA: Diagnosis not present

## 2019-08-28 LAB — CMP (CANCER CENTER ONLY)
ALT: 16 U/L (ref 0–44)
AST: 18 U/L (ref 15–41)
Albumin: 3.6 g/dL (ref 3.5–5.0)
Alkaline Phosphatase: 72 U/L (ref 38–126)
Anion gap: 7 (ref 5–15)
BUN: 12 mg/dL (ref 6–20)
CO2: 25 mmol/L (ref 22–32)
Calcium: 8.8 mg/dL — ABNORMAL LOW (ref 8.9–10.3)
Chloride: 108 mmol/L (ref 98–111)
Creatinine: 0.83 mg/dL (ref 0.44–1.00)
GFR, Est AFR Am: >60 mL/min (ref >60)
GFR, Estimated: >60 mL/min (ref >60)
Glucose, Bld: 92 mg/dL (ref 70–99)
Potassium: 4.1 mmol/L (ref 3.5–5.1)
Sodium: 140 mmol/L (ref 135–145)
Total Bilirubin: 0.3 mg/dL (ref 0.3–1.2)
Total Protein: 6.4 g/dL — ABNORMAL LOW (ref 6.5–8.1)

## 2019-08-28 LAB — CBC WITH DIFFERENTIAL (CANCER CENTER ONLY)
Abs Immature Granulocytes: 0.03 10*3/uL (ref 0.00–0.07)
Basophils Absolute: 0 10*3/uL (ref 0.0–0.1)
Basophils Relative: 1 %
Eosinophils Absolute: 0.1 10*3/uL (ref 0.0–0.5)
Eosinophils Relative: 1 %
HCT: 32.4 % — ABNORMAL LOW (ref 36.0–46.0)
Hemoglobin: 10.4 g/dL — ABNORMAL LOW (ref 12.0–15.0)
Immature Granulocytes: 1 %
Lymphocytes Relative: 45 %
Lymphs Abs: 1.7 10*3/uL (ref 0.7–4.0)
MCH: 30.1 pg (ref 26.0–34.0)
MCHC: 32.1 g/dL (ref 30.0–36.0)
MCV: 93.6 fL (ref 80.0–100.0)
Monocytes Absolute: 0.3 10*3/uL (ref 0.1–1.0)
Monocytes Relative: 7 %
Neutro Abs: 1.7 10*3/uL (ref 1.7–7.7)
Neutrophils Relative %: 45 %
Platelet Count: 231 10*3/uL (ref 150–400)
RBC: 3.46 MIL/uL — ABNORMAL LOW (ref 3.87–5.11)
RDW: 17.4 % — ABNORMAL HIGH (ref 11.5–15.5)
WBC Count: 3.8 10*3/uL — ABNORMAL LOW (ref 4.0–10.5)
nRBC: 0 % (ref 0.0–0.2)

## 2019-08-28 MED ORDER — SODIUM CHLORIDE 0.9 % IV SOLN
Freq: Once | INTRAVENOUS | Status: AC
  Filled 2019-08-28: qty 250

## 2019-08-28 MED ORDER — FAMOTIDINE IN NACL 20-0.9 MG/50ML-% IV SOLN
INTRAVENOUS | Status: AC
  Filled 2019-08-28: qty 50

## 2019-08-28 MED ORDER — SODIUM CHLORIDE 0.9% FLUSH
10.0000 mL | Freq: Once | INTRAVENOUS | Status: AC
  Administered 2019-08-28: 10 mL
  Filled 2019-08-28: qty 10

## 2019-08-28 MED ORDER — HEPARIN SOD (PORK) LOCK FLUSH 100 UNIT/ML IV SOLN
500.0000 [IU] | Freq: Once | INTRAVENOUS | Status: AC | PRN
  Administered 2019-08-28: 500 [IU]
  Filled 2019-08-28: qty 5

## 2019-08-28 MED ORDER — FAMOTIDINE IN NACL 20-0.9 MG/50ML-% IV SOLN
20.0000 mg | Freq: Once | INTRAVENOUS | Status: AC
  Administered 2019-08-28: 20 mg via INTRAVENOUS

## 2019-08-28 MED ORDER — SODIUM CHLORIDE 0.9 % IV SOLN
65.0000 mg/m2 | Freq: Once | INTRAVENOUS | Status: AC
  Administered 2019-08-28: 132 mg via INTRAVENOUS
  Filled 2019-08-28: qty 22

## 2019-08-28 MED ORDER — DIPHENHYDRAMINE HCL 50 MG/ML IJ SOLN
INTRAMUSCULAR | Status: AC
  Filled 2019-08-28: qty 1

## 2019-08-28 MED ORDER — SODIUM CHLORIDE 0.9% FLUSH
10.0000 mL | INTRAVENOUS | Status: DC | PRN
  Administered 2019-08-28: 10 mL
  Filled 2019-08-28: qty 10

## 2019-08-28 MED ORDER — DIPHENHYDRAMINE HCL 50 MG/ML IJ SOLN
25.0000 mg | Freq: Once | INTRAMUSCULAR | Status: AC
  Administered 2019-08-28: 25 mg via INTRAVENOUS

## 2019-08-28 NOTE — Patient Instructions (Signed)
[facility] Discharge Instructions for Patients Receiving Chemotherapy  Today you received the following chemotherapy agent: Taxol  To help prevent nausea and vomiting after your treatment, we encourage you to take your nausea medication as directed by your MD.   If you develop nausea and vomiting that is not controlled by your nausea medication, call the clinic.   BELOW ARE SYMPTOMS THAT SHOULD BE REPORTED IMMEDIATELY:  *FEVER GREATER THAN 100.5 F  *CHILLS WITH OR WITHOUT FEVER  NAUSEA AND VOMITING THAT IS NOT CONTROLLED WITH YOUR NAUSEA MEDICATION  *UNUSUAL SHORTNESS OF BREATH  *UNUSUAL BRUISING OR BLEEDING  TENDERNESS IN MOUTH AND THROAT WITH OR WITHOUT PRESENCE OF ULCERS  *URINARY PROBLEMS  *BOWEL PROBLEMS  UNUSUAL RASH Items with * indicate a potential emergency and should be followed up as soon as possible.  Feel free to call the clinic should you have any questions or concerns. The clinic phone number is [phone].  Please show the [facility] at check-in to the Emergency Department and triage nurse.

## 2019-09-02 ENCOUNTER — Other Ambulatory Visit: Payer: Self-pay | Admitting: *Deleted

## 2019-09-02 DIAGNOSIS — C50812 Malignant neoplasm of overlapping sites of left female breast: Secondary | ICD-10-CM

## 2019-09-03 NOTE — Progress Notes (Signed)
Patient Care Team: Patient, [name] (General Practice) [name], RN as Oncology Nurse Navigator Rockwell Germany, RN as Oncology Nurse Navigator Rolm Bookbinder, MD as Surgeon (General Surgery) Nicholas Lose, MD as Medical Oncologist (Hematology and Oncology)  DIAGNOSIS:    ICD-10-CM   1. Port-A-Cath in place  Z95.828 sodium chloride flush (NS) 0.9 % injection 10 mL    alteplase (CATHFLO ACTIVASE) injection 2 mg  2. 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 ER 50%, PR 30%, Ki-67 50%, HER-2 negative ratio 1.03   03/04/2019 Cancer Staging   Staging form: Breast, AJCC 8th Edition - Clinical stage from 03/04/2019: Stage IIIB (cT4, cN1, cM0, G3, ER+, PR+, HER2-) - Signed by GNicholas Lose MD on 03/04/2019    Neo-Adjuvant Chemotherapy   Adriamycin and Cytoxan x 4 given every three weeks without Neulasta support., followed by weekly Taxol x 12 versus Taxotere Cytoxan after her delivery   04/15/2019 Genetic Testing   Negative genetic testing:  No pathogenic variants detected on the [facility] Breast Cancer Guidelines-Based Panel or the Common Hereditary Cancers Panel. The report date is 04/15/2019.  The Breast Cancer Guidelines-Based panel offered by [facility] includes sequencing and rearrangement analysis for the following 11 genes:  ATM, BRCA1, BRCA2, CDH1, CHEK2, NBN, NF1, PALB2, PTEN, STK11  and TP53.  The Common Hereditary Cancers Panel offered by [facility] includes sequencing and/or deletion duplication testing of the following 48 genes: APC, ATM, AXIN2, BARD1, BMPR1A, BRCA1, BRCA2, BRIP1, CDH1, CDK4, CDKN2A (p14ARF), CDKN2A (p16INK4a), CHEK2, CTNNA1, DICER1, EPCAM (Deletion/duplication testing only), GREM1 (promoter region deletion/duplication testing only), KIT, MEN1, MLH1, MSH2, MSH3, MSH6, MUTYH, NBN, NF1, NHTL1, PALB2, PDGFRA, PMS2, POLD1, POLE, PTEN, RAD50, RAD51C, RAD51D, RNF43, SDHB, SDHC, SDHD, SMAD4, SMARCA4. STK11, TP53, TSC1, TSC2, and VHL.  The following genes were evaluated for sequence changes only: SDHA and HOXB13 c.251G>A variant only.      CHIEF COMPLIANT: Cycle 10 Taxol  INTERVAL HISTORY: Melissa Lehrmannis a 29y.o. with above-mentioned history of left breast cancerwho completed 4 cycles ofneoadjuvant chemotherapy with dose dense Adriamycin and Cytoxan,delivered her baby, and is currently on weekly Taxol.She presents to the clinic todayfor cycle10.    She is having mild discomfort in the bottom of her feet.  She does not report this as neuropathy.  It is just an uncomfortable sensation in the feet.  Denies any nausea or vomiting.  ALLERGIES:  has No Known Allergies.  MEDICATIONS:  Current Outpatient Medications  Medication Sig Dispense Refill  . acetaminophen (TYLENOL) 500 MG tablet Take 500 mg by mouth every 6 (six) hours as needed.      . digoxin (LANOXIN) 0.125 MG tablet Take 1 tablet (125 mcg total) by mouth daily. 30 tablet 11  . furosemide (LASIX) 20 MG tablet Take 1 tablet (20 mg total) by mouth daily. May take an additional tablet daily as needed. 60 tablet 1  . lidocaine-prilocaine (EMLA) cream APPLY 1 APPLICATION TOPICALLY  ONCE FOR 1 DOSE.    . sacubitril-valsartan (ENTRESTO) 49-51 MG Take 1 tablet by mouth 2 (two) times daily. 60 tablet 3  . spironolactone (ALDACTONE) 25 MG tablet Take 1 tablet (25 mg total) by mouth daily. 90 tablet 3   Current  Facility-Administered Medications  Medication Dose Route Frequency Provider Last Rate Last Admin  . alteplase (CATHFLO ACTIVASE) injection 2 mg  2 mg Intracatheter Once Nicholas Lose, MD      . sodium chloride flush (NS) 0.9 % injection 10 mL  10 mL Intravenous PRN Nicholas Lose, MD   10 mL at 09/04/19 1210    PHYSICAL EXAMINATION: ECOG PERFORMANCE STATUS: 1 - Symptomatic but completely ambulatory  Vitals:   09/04/19 1209  BP: 112/75  Pulse: 85  Resp: 18  Temp: 98.2 F (36.8 C)  SpO2: 100%   Filed Weights   09/04/19 1209  Weight: 185 lb 9.6 oz (84.2 kg)    LABORATORY DATA:  I have reviewed the data as listed CMP Latest Ref Rng & Units 08/28/2019 08/21/2019 08/14/2019  Glucose 70 - 99 mg/dL 92 90 109(H)  BUN 6 - 20 mg/dL '12 9 11  '$ Creatinine 0.44 - 1.00 mg/dL 0.83 0.82 0.87  Sodium 135 - 145 mmol/L 140 142 140  Potassium 3.5 - 5.1 mmol/L 4.1 3.6 3.7  Chloride 98 - 111 mmol/L 108 106 106  CO2 22 - 32 mmol/L '25 26 25  '$ Calcium 8.9 - 10.3 mg/dL 8.8(L) 8.9 8.6(L)  Total Protein 6.5 - 8.1 g/dL 6.4(L) 6.7 6.6  Total Bilirubin 0.3 - 1.2 mg/dL 0.3 0.4 0.3  Alkaline Phos 38 - 126 U/L 72 76 77  AST 15 - 41 U/L '18 16 16  '$ ALT 0 - 44 U/L '16 14 17    '$ Lab Results  Component Value Date   WBC 3.8 (L) 08/28/2019   HGB 10.4 (L) 08/28/2019   HCT 32.4 (L) 08/28/2019   MCV 93.6 08/28/2019   PLT 231 08/28/2019   NEUTROABS 1.7 08/28/2019    ASSESSMENT & PLAN:  Malignant neoplasm of overlapping sites of left breast in female, estrogen receptor positive ([place]) 02/26/2019:Pregnant lady with 38-monthhistory of left breast swelling and skin thickening, mammogram showed pleomorphic calcifications 8 cm, axilla lymph node biopsy positive ER 60%, PR 60%, Ki-67 50%, HER-2 negative ratio 1.25, anterior and posterior end of the calcifications biopsied: 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 Neoadjuvant chemotherapy with dose dense Adriamycin and Cytoxan given every 3 weeks without Neulasta support given her pregnancycompleted 05/28/2019 2. Post-Partum weekly Taxol x12starting 07/03/2019 3. Followed by mastectomy and targeted node dissection versus full axillary lymph node dissection depending on the response. 4. Adjuvant radiation therapy 5. Followed by adjuvant antiestrogen therapy with complete estrogen blockade (ovarian suppression with AI) CT C/A/P on 5/13 shows sclerotic bone lesions, bone scan negative 6. 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 BHaroldine Lawsstarted her on Entresto and digoxin. Also on Lasix once a day.Continuing to show improvement in symptoms. 2.Chemo induced anemia: Hemoglobin is 10.4  Return to clinic weekly for Taxol every other week for follow-up with me. After cycle 12 she has appointments for mammogram and ultrasound. Follow-up in tumor  board after mammogram and ultrasound.    No orders of the defined types were placed in this encounter.  The patient has a good understanding of the overall plan. she agrees with it. she will call with any problems that may develop before the next visit here.  Total time spent: 30 mins including face to face time and time spent for planning, charting and coordination of care  Nicholas Lose, MD 09/04/2019  I, Cloyde Reams Dorshimer, am acting as scribe for Dr. Nicholas Lose.  I have reviewed the above documentation for accuracy and completeness, and I  agree with the above.

## 2019-09-04 ENCOUNTER — Inpatient Hospital Stay (HOSPITAL_BASED_OUTPATIENT_CLINIC_OR_DEPARTMENT_OTHER): Payer: Medicaid Other | Admitting: Hematology and Oncology

## 2019-09-04 ENCOUNTER — Inpatient Hospital Stay: Payer: Medicaid Other

## 2019-09-04 ENCOUNTER — Other Ambulatory Visit: Payer: Self-pay

## 2019-09-04 VITALS — BP 112/75 | HR 85 | Temp 98.2°F | Resp 18 | Ht 63.0 in | Wt 185.6 lb

## 2019-09-04 DIAGNOSIS — Z170 Estrogen receptor positive status [ER+]: Secondary | ICD-10-CM | POA: Diagnosis not present

## 2019-09-04 DIAGNOSIS — Z95828 Presence of other vascular implants and grafts: Secondary | ICD-10-CM

## 2019-09-04 DIAGNOSIS — C50812 Malignant neoplasm of overlapping sites of left female breast: Secondary | ICD-10-CM

## 2019-09-04 DIAGNOSIS — Z5111 Encounter for antineoplastic chemotherapy: Secondary | ICD-10-CM | POA: Diagnosis not present

## 2019-09-04 LAB — CBC WITH DIFFERENTIAL (CANCER CENTER ONLY)
Abs Immature Granulocytes: 0.02 10*3/uL (ref 0.00–0.07)
Basophils Absolute: 0 10*3/uL (ref 0.0–0.1)
Basophils Relative: 1 %
Eosinophils Absolute: 0.1 10*3/uL (ref 0.0–0.5)
Eosinophils Relative: 2 %
HCT: 38.6 % (ref 36.0–46.0)
Hemoglobin: 12.7 g/dL (ref 12.0–15.0)
Immature Granulocytes: 0 %
Lymphocytes Relative: 38 %
Lymphs Abs: 1.8 10*3/uL (ref 0.7–4.0)
MCH: 30.7 pg (ref 26.0–34.0)
MCHC: 32.9 g/dL (ref 30.0–36.0)
MCV: 93.2 fL (ref 80.0–100.0)
Monocytes Absolute: 0.4 10*3/uL (ref 0.1–1.0)
Monocytes Relative: 9 %
Neutro Abs: 2.4 10*3/uL (ref 1.7–7.7)
Neutrophils Relative %: 50 %
Platelet Count: 239 10*3/uL (ref 150–400)
RBC: 4.14 MIL/uL (ref 3.87–5.11)
RDW: 17.4 % — ABNORMAL HIGH (ref 11.5–15.5)
WBC Count: 4.8 10*3/uL (ref 4.0–10.5)
nRBC: 0 % (ref 0.0–0.2)

## 2019-09-04 LAB — CMP (CANCER CENTER ONLY)
ALT: 19 U/L (ref 0–44)
AST: 20 U/L (ref 15–41)
Albumin: 4.1 g/dL (ref 3.5–5.0)
Alkaline Phosphatase: 62 U/L (ref 38–126)
Anion gap: 15 (ref 5–15)
BUN: 12 mg/dL (ref 6–20)
CO2: 24 mmol/L (ref 22–32)
Calcium: 9.4 mg/dL (ref 8.9–10.3)
Chloride: 104 mmol/L (ref 98–111)
Creatinine: 0.94 mg/dL (ref 0.44–1.00)
GFR, Est AFR Am: >60 mL/min (ref >60)
GFR, Estimated: >60 mL/min (ref >60)
Glucose, Bld: 103 mg/dL — ABNORMAL HIGH (ref 70–99)
Potassium: 3.6 mmol/L (ref 3.5–5.1)
Sodium: 143 mmol/L (ref 135–145)
Total Bilirubin: 0.5 mg/dL (ref 0.3–1.2)
Total Protein: 7.6 g/dL (ref 6.5–8.1)

## 2019-09-04 MED ORDER — SODIUM CHLORIDE 0.9 % IV SOLN
Freq: Once | INTRAVENOUS | Status: AC
  Filled 2019-09-04: qty 250

## 2019-09-04 MED ORDER — FAMOTIDINE IN NACL 20-0.9 MG/50ML-% IV SOLN
20.0000 mg | Freq: Once | INTRAVENOUS | Status: AC
  Administered 2019-09-04: 20 mg via INTRAVENOUS

## 2019-09-04 MED ORDER — FAMOTIDINE IN NACL 20-0.9 MG/50ML-% IV SOLN
INTRAVENOUS | Status: AC
  Filled 2019-09-04: qty 50

## 2019-09-04 MED ORDER — ALTEPLASE 2 MG IJ SOLR
2.0000 mg | Freq: Once | INTRAMUSCULAR | Status: AC
  Administered 2019-09-04: 2 mg
  Filled 2019-09-04: qty 2

## 2019-09-04 MED ORDER — SODIUM CHLORIDE 0.9% FLUSH
10.0000 mL | INTRAVENOUS | Status: DC | PRN
  Administered 2019-09-04: 10 mL
  Filled 2019-09-04: qty 10

## 2019-09-04 MED ORDER — SODIUM CHLORIDE 0.9 % IV SOLN
65.0000 mg/m2 | Freq: Once | INTRAVENOUS | Status: AC
  Administered 2019-09-04: 132 mg via INTRAVENOUS
  Filled 2019-09-04: qty 22

## 2019-09-04 MED ORDER — DIPHENHYDRAMINE HCL 50 MG/ML IJ SOLN
25.0000 mg | Freq: Once | INTRAMUSCULAR | Status: AC
  Administered 2019-09-04: 25 mg via INTRAVENOUS

## 2019-09-04 MED ORDER — SODIUM CHLORIDE 0.9% FLUSH
10.0000 mL | INTRAVENOUS | Status: DC | PRN
  Administered 2019-09-04: 10 mL via INTRAVENOUS
  Filled 2019-09-04: qty 10

## 2019-09-04 MED ORDER — HEPARIN SOD (PORK) LOCK FLUSH 100 UNIT/ML IV SOLN
500.0000 [IU] | Freq: Once | INTRAVENOUS | Status: AC | PRN
  Administered 2019-09-04: 500 [IU]
  Filled 2019-09-04: qty 5

## 2019-09-04 MED ORDER — ALTEPLASE 2 MG IJ SOLR
INTRAMUSCULAR | Status: AC
  Filled 2019-09-04: qty 2

## 2019-09-04 MED ORDER — DIPHENHYDRAMINE HCL 50 MG/ML IJ SOLN
INTRAMUSCULAR | Status: AC
  Filled 2019-09-04: qty 1

## 2019-09-04 NOTE — Patient Instructions (Signed)
[facility] Discharge Instructions for Patients Receiving Chemotherapy  Today you received the following chemotherapy agents :  Taxol.  To help prevent nausea and vomiting after your treatment, we encourage you to take your nausea medication as prescribed.   If you develop nausea and vomiting that is not controlled by your nausea medication, call the clinic.   BELOW ARE SYMPTOMS THAT SHOULD BE REPORTED IMMEDIATELY:  *FEVER GREATER THAN 100.5 F  *CHILLS WITH OR WITHOUT FEVER  NAUSEA AND VOMITING THAT IS NOT CONTROLLED WITH YOUR NAUSEA MEDICATION  *UNUSUAL SHORTNESS OF BREATH  *UNUSUAL BRUISING OR BLEEDING  TENDERNESS IN MOUTH AND THROAT WITH OR WITHOUT PRESENCE OF ULCERS  *URINARY PROBLEMS  *BOWEL PROBLEMS  UNUSUAL RASH Items with * indicate a potential emergency and should be followed up as soon as possible.  Feel free to call the clinic should you have any questions or concerns. The clinic phone number is [phone].  Please show the CHEMO ALERT CARD at check-in to the Emergency Department and triage nurse.   

## 2019-09-04 NOTE — Assessment & Plan Note (Signed)
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 Neoadjuvant chemotherapy with dose dense Adriamycin and Cytoxan given every 3 weeks without Neulasta support given her pregnancycompleted 05/28/2019 2. Post-Partum weekly Taxol x12starting 07/03/2019 3. Followed by mastectomy and targeted node dissection versus full axillary lymph node dissection depending on the response. 4. Adjuvant radiation therapy 5. Followed by adjuvant antiestrogen therapy with complete estrogen blockade (ovarian suppression with AI) CT C/A/P on 5/13 shows sclerotic bone lesions, bone scan negative 6. 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 Bensimhon started her on Entresto and digoxin. Also on Lasix once a day.Continuing to show improvement in symptoms. 2.Chemo induced anemia:   Return to clinic weekly for Taxol every other week for follow-up with me. After cycle 12 she has appointments for mammogram and ultrasound. Follow-up in tumor board after mammogram and ultrasound. 

## 2019-09-08 ENCOUNTER — Telehealth: Payer: Self-pay | Admitting: Hematology and Oncology

## 2019-09-08 ENCOUNTER — Other Ambulatory Visit: Payer: Self-pay | Admitting: General Surgery

## 2019-09-08 ENCOUNTER — Other Ambulatory Visit: Payer: Self-pay

## 2019-09-08 ENCOUNTER — Ambulatory Visit (HOSPITAL_COMMUNITY)
Admission: RE | Admit: 2019-09-08 | Discharge: 2019-09-08 | Disposition: A | Discharge status: Home / Self Care | Payer: Medicaid Other | Source: Ambulatory Visit | Attending: Adult Health | Admitting: Adult Health

## 2019-09-08 DIAGNOSIS — Z9221 Personal history of antineoplastic chemotherapy: Secondary | ICD-10-CM | POA: Diagnosis not present

## 2019-09-08 DIAGNOSIS — Z853 Personal history of malignant neoplasm of breast: Secondary | ICD-10-CM | POA: Insufficient documentation

## 2019-09-08 DIAGNOSIS — Z79899 Other long term (current) drug therapy: Secondary | ICD-10-CM | POA: Diagnosis not present

## 2019-09-08 DIAGNOSIS — I5022 Chronic systolic (congestive) heart failure: Secondary | ICD-10-CM

## 2019-09-08 DIAGNOSIS — C50812 Malignant neoplasm of overlapping sites of left female breast: Secondary | ICD-10-CM

## 2019-09-08 DIAGNOSIS — I110 Hypertensive heart disease with heart failure: Secondary | ICD-10-CM | POA: Insufficient documentation

## 2019-09-08 DIAGNOSIS — I5021 Acute systolic (congestive) heart failure: Secondary | ICD-10-CM | POA: Diagnosis present

## 2019-09-08 MED ORDER — SACUBITRIL-VALSARTAN 97-103 MG PO TABS: 1.0000 | ORAL_TABLET | Freq: Two times a day (BID) | ORAL | 11 refills | Status: DC

## 2019-09-08 NOTE — Telephone Encounter (Signed)
Per 6/25 los, no changes made to pt schedule

## 2019-09-08 NOTE — Patient Instructions (Addendum)
It was a pleasure seeing you today!  MEDICATIONS: -We are changing your medications today -Increase Entresto to 97/103 mg (1 tablet) twice daily. You may take 2 tablets of the 49/51 mg strength until you pick up the new medication. -Call if you have questions about your medications.   NEXT APPOINTMENT: Return to clinic in 4 weeks with Dr. Haroldine Laws.  In general, to take care of your heart failure: -Limit your fluid intake to 2 Liters (half-gallon) per day.   -Limit your salt intake to ideally 2-3 grams (2000-3000 mg) per day. -Weigh yourself daily and record, and bring that "weight diary" to your next appointment.  (Weight gain of 2-3 pounds in 1 day typically means fluid weight.) -The medications for your heart are to help your heart and help you live longer.   -Please contact us before stopping any of your heart medications.  Call the clinic at [phone] with questions or to reschedule future appointments.

## 2019-09-11 ENCOUNTER — Inpatient Hospital Stay: Payer: Medicaid Other

## 2019-09-11 ENCOUNTER — Inpatient Hospital Stay: Payer: Medicaid Other | Attending: Hematology and Oncology

## 2019-09-11 ENCOUNTER — Other Ambulatory Visit: Payer: Self-pay

## 2019-09-11 VITALS — BP 127/79 | HR 87 | Resp 18 | Wt 190.0 lb

## 2019-09-11 DIAGNOSIS — Z170 Estrogen receptor positive status [ER+]: Secondary | ICD-10-CM | POA: Diagnosis not present

## 2019-09-11 DIAGNOSIS — R200 Anesthesia of skin: Secondary | ICD-10-CM | POA: Insufficient documentation

## 2019-09-11 DIAGNOSIS — Z79899 Other long term (current) drug therapy: Secondary | ICD-10-CM | POA: Insufficient documentation

## 2019-09-11 DIAGNOSIS — G620 Drug-induced polyneuropathy: Secondary | ICD-10-CM | POA: Diagnosis not present

## 2019-09-11 DIAGNOSIS — T451X5A Adverse effect of antineoplastic and immunosuppressive drugs, initial encounter: Secondary | ICD-10-CM | POA: Diagnosis not present

## 2019-09-11 DIAGNOSIS — Z95828 Presence of other vascular implants and grafts: Secondary | ICD-10-CM

## 2019-09-11 DIAGNOSIS — D6481 Anemia due to antineoplastic chemotherapy: Secondary | ICD-10-CM | POA: Insufficient documentation

## 2019-09-11 DIAGNOSIS — C50812 Malignant neoplasm of overlapping sites of left female breast: Secondary | ICD-10-CM | POA: Diagnosis present

## 2019-09-11 DIAGNOSIS — C773 Secondary and unspecified malignant neoplasm of axilla and upper limb lymph nodes: Secondary | ICD-10-CM | POA: Insufficient documentation

## 2019-09-11 DIAGNOSIS — Z79811 Long term (current) use of aromatase inhibitors: Secondary | ICD-10-CM | POA: Diagnosis not present

## 2019-09-11 DIAGNOSIS — Z9012 Acquired absence of left breast and nipple: Secondary | ICD-10-CM | POA: Insufficient documentation

## 2019-09-11 DIAGNOSIS — Z5111 Encounter for antineoplastic chemotherapy: Secondary | ICD-10-CM | POA: Insufficient documentation

## 2019-09-11 LAB — CBC WITH DIFFERENTIAL (CANCER CENTER ONLY)
Abs Immature Granulocytes: 0.05 10*3/uL (ref 0.00–0.07)
Basophils Absolute: 0 10*3/uL (ref 0.0–0.1)
Basophils Relative: 1 %
Eosinophils Absolute: 0.1 10*3/uL (ref 0.0–0.5)
Eosinophils Relative: 2 %
HCT: 34.2 % — ABNORMAL LOW (ref 36.0–46.0)
Hemoglobin: 11 g/dL — ABNORMAL LOW (ref 12.0–15.0)
Immature Granulocytes: 1 %
Lymphocytes Relative: 36 %
Lymphs Abs: 1.8 10*3/uL (ref 0.7–4.0)
MCH: 30.3 pg (ref 26.0–34.0)
MCHC: 32.2 g/dL (ref 30.0–36.0)
MCV: 94.2 fL (ref 80.0–100.0)
Monocytes Absolute: 0.4 10*3/uL (ref 0.1–1.0)
Monocytes Relative: 8 %
Neutro Abs: 2.6 10*3/uL (ref 1.7–7.7)
Neutrophils Relative %: 52 %
Platelet Count: 277 10*3/uL (ref 150–400)
RBC: 3.63 MIL/uL — ABNORMAL LOW (ref 3.87–5.11)
RDW: 17.6 % — ABNORMAL HIGH (ref 11.5–15.5)
WBC Count: 5 10*3/uL (ref 4.0–10.5)
nRBC: 0 % (ref 0.0–0.2)

## 2019-09-11 LAB — CMP (CANCER CENTER ONLY)
ALT: 15 U/L (ref 0–44)
AST: 17 U/L (ref 15–41)
Albumin: 3.8 g/dL (ref 3.5–5.0)
Alkaline Phosphatase: 59 U/L (ref 38–126)
Anion gap: 12 (ref 5–15)
BUN: 8 mg/dL (ref 6–20)
CO2: 25 mmol/L (ref 22–32)
Calcium: 9.1 mg/dL (ref 8.9–10.3)
Chloride: 105 mmol/L (ref 98–111)
Creatinine: 0.82 mg/dL (ref 0.44–1.00)
GFR, Est AFR Am: >60 mL/min (ref >60)
GFR, Estimated: >60 mL/min (ref >60)
Glucose, Bld: 100 mg/dL — ABNORMAL HIGH (ref 70–99)
Potassium: 3.5 mmol/L (ref 3.5–5.1)
Sodium: 142 mmol/L (ref 135–145)
Total Bilirubin: 0.3 mg/dL (ref 0.3–1.2)
Total Protein: 6.9 g/dL (ref 6.5–8.1)

## 2019-09-11 MED ORDER — DIPHENHYDRAMINE HCL 50 MG/ML IJ SOLN
INTRAMUSCULAR | Status: AC
  Filled 2019-09-11: qty 1

## 2019-09-11 MED ORDER — DIPHENHYDRAMINE HCL 50 MG/ML IJ SOLN
25.0000 mg | Freq: Once | INTRAMUSCULAR | Status: AC
  Administered 2019-09-11: 25 mg via INTRAVENOUS

## 2019-09-11 MED ORDER — HEPARIN SOD (PORK) LOCK FLUSH 100 UNIT/ML IV SOLN
500.0000 [IU] | Freq: Once | INTRAVENOUS | Status: AC | PRN
  Administered 2019-09-11: 500 [IU]
  Filled 2019-09-11: qty 5

## 2019-09-11 MED ORDER — FAMOTIDINE IN NACL 20-0.9 MG/50ML-% IV SOLN
20.0000 mg | Freq: Once | INTRAVENOUS | Status: AC
  Administered 2019-09-11: 20 mg via INTRAVENOUS

## 2019-09-11 MED ORDER — SODIUM CHLORIDE 0.9 % IV SOLN
Freq: Once | INTRAVENOUS | Status: AC
  Filled 2019-09-11: qty 250

## 2019-09-11 MED ORDER — SODIUM CHLORIDE 0.9 % IV SOLN
65.0000 mg/m2 | Freq: Once | INTRAVENOUS | Status: AC
  Administered 2019-09-11: 132 mg via INTRAVENOUS
  Filled 2019-09-11: qty 22

## 2019-09-11 MED ORDER — SODIUM CHLORIDE 0.9% FLUSH
10.0000 mL | INTRAVENOUS | Status: DC | PRN
  Administered 2019-09-11: 10 mL
  Filled 2019-09-11: qty 10

## 2019-09-11 MED ORDER — SODIUM CHLORIDE 0.9% FLUSH
10.0000 mL | Freq: Once | INTRAVENOUS | Status: AC
  Administered 2019-09-11: 10 mL
  Filled 2019-09-11: qty 10

## 2019-09-11 MED ORDER — FAMOTIDINE IN NACL 20-0.9 MG/50ML-% IV SOLN
INTRAVENOUS | Status: AC
  Filled 2019-09-11: qty 50

## 2019-09-11 NOTE — Patient Instructions (Signed)
[facility] Discharge Instructions for Patients Receiving Chemotherapy  Today you received the following chemotherapy agents: paclitaxel.  To help prevent nausea and vomiting after your treatment, we encourage you to take your nausea medication as directed.   If you develop nausea and vomiting that is not controlled by your nausea medication, call the clinic.   BELOW ARE SYMPTOMS THAT SHOULD BE REPORTED IMMEDIATELY:  *FEVER GREATER THAN 100.5 F  *CHILLS WITH OR WITHOUT FEVER  NAUSEA AND VOMITING THAT IS NOT CONTROLLED WITH YOUR NAUSEA MEDICATION  *UNUSUAL SHORTNESS OF BREATH  *UNUSUAL BRUISING OR BLEEDING  TENDERNESS IN MOUTH AND THROAT WITH OR WITHOUT PRESENCE OF ULCERS  *URINARY PROBLEMS  *BOWEL PROBLEMS  UNUSUAL RASH Items with * indicate a potential emergency and should be followed up as soon as possible.  Feel free to call the clinic should you have any questions or concerns. The clinic phone number is [phone].  Please show the CHEMO ALERT CARD at check-in to the Emergency Department and triage nurse.   

## 2019-09-11 NOTE — Patient Instructions (Signed)

## 2019-09-16 ENCOUNTER — Telehealth: Payer: Self-pay | Admitting: Hematology and Oncology

## 2019-09-16 NOTE — Telephone Encounter (Signed)
Rescheduled per provider request, called and left a msg for pt with new appt time

## 2019-09-17 ENCOUNTER — Ambulatory Visit (HOSPITAL_BASED_OUTPATIENT_CLINIC_OR_DEPARTMENT_OTHER)
Admission: RE | Admit: 2019-09-17 | Discharge: 2019-09-17 | Disposition: A | Discharge status: Home / Self Care | Payer: Medicaid Other | Source: Ambulatory Visit | Attending: Internal Medicine | Admitting: Internal Medicine

## 2019-09-17 ENCOUNTER — Ambulatory Visit (HOSPITAL_COMMUNITY)
Admission: RE | Admit: 2019-09-17 | Discharge: 2019-09-17 | Disposition: A | Discharge status: Home / Self Care | Payer: Medicaid Other | Source: Ambulatory Visit | Attending: Obstetrics and Gynecology | Admitting: Obstetrics and Gynecology

## 2019-09-17 ENCOUNTER — Encounter (HOSPITAL_COMMUNITY): Payer: Self-pay | Admitting: Internal Medicine

## 2019-09-17 ENCOUNTER — Other Ambulatory Visit: Payer: Self-pay

## 2019-09-17 VITALS — BP 118/76 | HR 68 | Wt 190.0 lb

## 2019-09-17 DIAGNOSIS — I428 Other cardiomyopathies: Secondary | ICD-10-CM | POA: Insufficient documentation

## 2019-09-17 DIAGNOSIS — Z79899 Other long term (current) drug therapy: Secondary | ICD-10-CM | POA: Diagnosis not present

## 2019-09-17 DIAGNOSIS — Z87891 Personal history of nicotine dependence: Secondary | ICD-10-CM | POA: Diagnosis not present

## 2019-09-17 DIAGNOSIS — Z170 Estrogen receptor positive status [ER+]: Secondary | ICD-10-CM

## 2019-09-17 DIAGNOSIS — I110 Hypertensive heart disease with heart failure: Secondary | ICD-10-CM | POA: Diagnosis not present

## 2019-09-17 DIAGNOSIS — Z8751 Personal history of pre-term labor: Secondary | ICD-10-CM | POA: Insufficient documentation

## 2019-09-17 DIAGNOSIS — I5022 Chronic systolic (congestive) heart failure: Secondary | ICD-10-CM | POA: Insufficient documentation

## 2019-09-17 DIAGNOSIS — C50812 Malignant neoplasm of overlapping sites of left female breast: Secondary | ICD-10-CM | POA: Diagnosis not present

## 2019-09-17 DIAGNOSIS — I10 Essential (primary) hypertension: Secondary | ICD-10-CM

## 2019-09-17 DIAGNOSIS — G2581 Restless legs syndrome: Secondary | ICD-10-CM | POA: Insufficient documentation

## 2019-09-17 DIAGNOSIS — Z803 Family history of malignant neoplasm of breast: Secondary | ICD-10-CM | POA: Insufficient documentation

## 2019-09-17 LAB — BASIC METABOLIC PANEL
Anion gap: 9 (ref 5–15)
BUN: 11 mg/dL (ref 6–20)
CO2: 24 mmol/L (ref 22–32)
Calcium: 9 mg/dL (ref 8.9–10.3)
Chloride: 105 mmol/L (ref 98–111)
Creatinine, Ser: 0.98 mg/dL (ref 0.44–1.00)
GFR calc Af Amer: >60 mL/min (ref >60)
GFR calc non Af Amer: >60 mL/min (ref >60)
Glucose, Bld: 104 mg/dL — ABNORMAL HIGH (ref 70–99)
Potassium: 3.9 mmol/L (ref 3.5–5.1)
Sodium: 138 mmol/L (ref 135–145)

## 2019-09-17 MED ORDER — FUROSEMIDE 20 MG PO TABS: 20.0000 mg | ORAL_TABLET | Freq: Every day | ORAL | 2 refills | Status: DC | PRN

## 2019-09-17 MED ORDER — DAPAGLIFLOZIN PROPANEDIOL 10 MG PO TABS
10.0000 mg | ORAL_TABLET | Freq: Every day | ORAL | 11 refills | Status: DC
Start: 2019-09-17 — End: 2020-05-12

## 2019-09-17 MED ORDER — CARVEDILOL 3.125 MG PO TABS
3.1250 mg | ORAL_TABLET | Freq: Two times a day (BID) | ORAL | 11 refills | Status: DC
Start: 2019-09-17 — End: 2020-01-13

## 2019-09-17 NOTE — Addendum Note (Signed)
Encounter addended by: Shonna Chock, CMA on: 0/11/2328 3:00 PM  Actions taken: Medication long-term status modified, Order list changed, Diagnosis association updated, Charge Capture section accepted, Clinical Note Signed

## 2019-09-17 NOTE — Progress Notes (Signed)
Patient Care Team: Patient, [name] (General Practice) Melissa Kaufmann, RN as Oncology Nurse Navigator Melissa Germany, RN as Oncology Nurse Navigator Melissa Bookbinder, MD as Surgeon (General Surgery) Melissa Lose, MD as Medical Oncologist (Hematology and Oncology)  DIAGNOSIS:    ICD-10-CM   1. Malignant neoplasm of overlapping sites of left breast in female, estrogen receptor positive ([place])  C50.812    Z17.0     SUMMARY OF ONCOLOGIC HISTORY: Oncology History  Malignant neoplasm of overlapping sites of left breast in female, estrogen receptor positive ([place])  02/26/2019 Initial Diagnosis   Pregnant lady with 66-monthhistory of left breast swelling and skin thickening, mammogram showed pleomorphic calcifications 8 cm, axilla lymph node biopsy positive ER 60%, PR 60%, Ki-67 50%, HER-2 negative ratio 1.25, anterior and posterior end of the calcifications biopsied: 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 ER 50%, PR 30%, Ki-67 50%, HER-2 negative ratio 1.03   03/04/2019 Cancer Staging   Staging form: Breast, AJCC 8th Edition - Clinical stage from 03/04/2019: Stage IIIB (cT4, cN1, cM0, G3, ER+, PR+, HER2-) - Signed by GNicholas Lose MD on 03/04/2019    Neo-Adjuvant Chemotherapy   Melissa [name] and Melissa [name] x 4 given every three weeks without Neulasta support., followed by weekly Melissa [name] x 12 versus Taxotere Melissa [name] after her delivery   04/15/2019 Genetic Testing   Negative genetic testing:  No pathogenic variants detected on the [employer] Breast Cancer Guidelines-Based Panel or the Common Hereditary Cancers Panel. The report date is 04/15/2019.  The Breast Cancer Guidelines-Based panel offered by [employer] includes sequencing and rearrangement analysis for the following 11 genes:  ATM, BRCA1, BRCA2, CDH1, CHEK2, NBN, NF1, PALB2, PTEN, STK11 and TP53.  The Common Hereditary Cancers Panel offered by [facility] includes sequencing and/or deletion duplication testing of the  following 48 genes: APC, ATM, AXIN2, BARD1, BMPR1A, BRCA1, BRCA2, BRIP1, CDH1, CDK4, CDKN2A (p14ARF), CDKN2A (p16INK4a), CHEK2, CTNNA1, DICER1, EPCAM (Deletion/duplication testing only), GREM1 (promoter region deletion/duplication testing only), KIT, MEN1, MLH1, MSH2, MSH3, MSH6, MUTYH, NBN, NF1, NHTL1, PALB2, PDGFRA, PMS2, POLD1, POLE, PTEN, RAD50, RAD51C, RAD51D, RNF43, SDHB, SDHC, SDHD, SMAD4, SMARCA4. STK11, TP53, TSC1, TSC2, and VHL.  The following genes were evaluated for sequence changes only: SDHA and HOXB13 c.251G>A variant only.      CHIEF COMPLIANT: Cycle 12 Melissa [name]  INTERVAL HISTORY: ARobyn Galatiis a 29y.o. with above-mentioned history of left breast cancerwho completed 4 cycles ofneoadjuvant chemotherapy with dose dense Melissa [name] and Melissa [name],delivered her baby, and is currently on weekly Melissa [name].She presents to the clinic todayfor cycle12.   Patient experienced mild numbness of the tips of the fingers and toes.  She also feels tired.  Her muscles feel weak in the lower extremities.  ALLERGIES:  has No Known Allergies.  MEDICATIONS:  Current Outpatient Medications  Medication Sig Dispense Refill  . carvedilol (COREG) 3.125 MG tablet Take 1 tablet (3.125 mg total) by mouth 2 (two) times daily. 60 tablet 11  . dapagliflozin propanediol (FARXIGA) 10 MG TABS tablet Take 1 tablet (10 mg total) by mouth daily before breakfast. 30 tablet 11  . furosemide (LASIX) 20 MG tablet Take 1 tablet (20 mg total) by mouth daily as needed for fluid or edema. 90 tablet 2  . lidocaine-prilocaine (EMLA) cream APPLY 1 APPLICATION TOPICALLY ONCE FOR 1 DOSE.    . sacubitril-valsartan (ENTRESTO) 97-103 MG Take 1 tablet by mouth 2 (two) times daily. 60 tablet 11  . spironolactone (ALDACTONE) 25 MG tablet Take 1  tablet (25 mg total) by mouth daily. 90 tablet 3   No current facility-administered medications for this visit.    PHYSICAL EXAMINATION: ECOG PERFORMANCE STATUS: 1 - Symptomatic but completely  ambulatory  Vitals:   09/18/19 1213  BP: 111/73  Pulse: 85  Resp: 20  Temp: 98.3 F (36.8 C)  SpO2: 100%   Filed Weights   09/18/19 1213  Weight: 189 lb 12.8 oz (86.1 kg)    LABORATORY DATA:  I have reviewed the data as listed CMP Latest Ref Rng & Units 09/17/2019 09/11/2019 09/04/2019  Glucose 70 - 99 mg/dL 104(H) 100(H) 103(H)  BUN 6 - 20 mg/dL _0 Creatinine 0.44 - 1.00 mg/dL 0.98 0.82 0.94  Sodium 135 - 145 mmol/L 138 142 143  Potassium 3.5 - 5.1 mmol/L 3.9 3.5 3.6  Chloride 98 - 111 mmol/L 105 105 104  CO2 22 - 32 mmol/L _1 Calcium 8.9 - 10.3 mg/dL 9.0 9.1 9.4  Total Protein 6.5 - 8.1 g/dL - 6.9 7.6  Total Bilirubin 0.3 - 1.2 mg/dL - 0.3 0.5  Alkaline Phos 38 - 126 U/L - 59 62  AST 15 - 41 U/L - 17 20  ALT 0 - 44 U/L - 15 19    Lab Results  Component Value Date   WBC 4.1 09/18/2019   HGB 10.3 (L) 09/18/2019   HCT 32.2 (L) 09/18/2019   MCV 93.9 09/18/2019   PLT 248 09/18/2019   NEUTROABS PENDING 09/18/2019    ASSESSMENT & PLAN:  Malignant neoplasm of overlapping sites of left breast in female, estrogen receptor positive ([place]) 02/26/2019:Pregnant lady with 87-monthhistory of left breast swelling and skin thickening, mammogram showed pleomorphic calcifications 8 cm, axilla lymph node biopsy positive ER 60%, PR 60%, Ki-67 50%, HER-2 negative ratio 1.25, anterior and posterior end of the calcifications biopsied: 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 Neoadjuvant chemotherapy with dose dense Melissa [name] and Melissa [name] given every 3 weeks without Neulasta support given her pregnancycompleted 05/28/2019 2. Post-Partumweekly Melissa [name] x12starting 07/03/2019 3. Followed by mastectomy and targeted node dissection versus full axillary lymph node dissection  depending on the response. 4. Adjuvant radiation therapy 5. Followed by adjuvant antiestrogen therapy with complete estrogen blockade (ovarian suppression with AI) CT C/A/P on 5/13 shows sclerotic bone lesions, bone scan negative 6. 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 BHaroldine Lawsstarted her on Entresto and digoxin. Also on Lasix once a day.Continuing to show improvement in symptoms. 2.Chemo induced anemia: Hemoglobin is 10.3 3.  Chemo-induced peripheral neuropathy: Mild numbness of the tips of the fingers and toes  Mammogram and ultrasound have been scheduled for 10/02/2019 Patient has appointments to see Dr. WDonne Hazel  Return to clinic after surgery to discuss the pathology report.    No orders of the defined types were placed in this encounter.  The patient has a good understanding of the overall plan. she agrees with it. she will call with any problems that may develop before the next visit here.  Total time spent: 30 mins including face to face time and time spent for planning, charting and coordination of care  GNicholas Lose MD 09/18/2019  I, MCloyde ReamsDorshimer, am  acting as scribe for Dr. Nicholas [name].  I have reviewed the above documentation for accuracy and completeness, and I agree with the above.

## 2019-09-17 NOTE — Progress Notes (Signed)
  Echocardiogram 2D Echocardiogram has been performed.  Melissa [name] 09/17/2019, 2:01 PM

## 2019-09-17 NOTE — Progress Notes (Signed)
CARDIO-ONCOLOGY CLINIC NOTE  Referring Physician: Pamelia Hoit Primary Cardiologist: New  HPI:  Melissa [name] is 29 y.o. female with left breast cancer referred by Dr. Pamelia Hoit for enrollment into the Cardio-Oncology program due to nonischemic cardiomyopathy.   Malignant neoplasm of overlapping sites of left breast in female, estrogen receptor positive ([facility])  02/26/2019 Initial Diagnosis   Pregnant lady with 5-month history of left breast swelling and skin thickening, mammogram showed pleomorphic calcifications 8 cm, axilla lymph node biopsy positive ER 60%, PR 60%, Ki-67 50%, HER-2 negative ratio 1.25, anterior and posterior end of the calcifications biopsied: 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 ER 50%, PR 30%, Ki-67 50%, HER-2 negative ratio 1.03   03/04/2019 Cancer Staging   Staging form: Breast, AJCC 8th Edition - Clinical stage from 03/04/2019: Stage IIIB (cT4, cN1, cM0, G3, ER+, PR+, HER2-) - Signed by Serena Croissant, MD on 03/04/2019    Neo-Adjuvant Chemotherapy   Adriamycin and Cytoxan x 4 given every three weeks without Neulasta support., followed by weekly Taxol x 12 versus Taxotere Cytoxan after her delivery   04/15/2019 Genetic Testing   Negative genetic testing:  No pathogenic variants detected on the [employer] Breast Cancer Guidelines-Based Panel or the Common Hereditary Cancers Panel. The report date is 04/15/2019.  The BreastCancerGuidelines-Basedpanel offered by [employer] includes sequencing and rearrangement analysis for the following 11genes: ATM, BRCA1, BRCA2, CDH1, CHEK2,NBN, NF1,PALB2, PTEN, STK11 and TP53.The Common Hereditary Cancers Panel offered by [employer] includes sequencing and/or deletion duplication testing of the following 48 genes: APC, ATM, AXIN2, BARD1, BMPR1A, BRCA1, BRCA2, BRIP1, CDH1, CDK4, CDKN2A (p14ARF), CDKN2A (p16INK4a), CHEK2, CTNNA1, DICER1, EPCAM (Deletion/duplication testing only), GREM1 (promoter region deletion/duplication testing  only), KIT, MEN1, MLH1, MSH2, MSH3, MSH6, MUTYH, NBN, NF1, NHTL1, PALB2, PDGFRA, PMS2, POLD1, POLE, PTEN, RAD50, RAD51C, RAD51D, RNF43, SDHB, SDHC, SDHD, SMAD4, SMARCA4. STK11, TP53, TSC1, TSC2, and VHL.  The following genes were evaluated for sequence changes only: SDHA and HOXB13 c.251G>A variant only.     HPI:  She denies any h/o HTN or heart problems. Diagnosed with left breast CA in 12/20 when she was 4 months pregnant with her second child. Her first daughter was born at 24 week in Nov 2019. No other complications except pre-term labor.  Echo 03/17/19: EF 60% RV normal    While pregnant she was treated with 4 cycles of neoadjuvant chemotherapy with dose dense Adriamycin and Cytoxan. She delivered her baby girl on 06/19/19. She did not have problems with HTN or pre-eclampsia.   She presented to the ED on 06/27/19 for lower extremity edema and shortness of breath. CT was negative for PE and she was discharged with 5 days of Lasix 20mg . Echo on 07/02/19 showed a severely decreased ejection fraction, <20%. She   ECHO 07/02/19 EF 15-20%. RV moderately reduced  Reviewed personally.  Has been following with our PharmD and HF meds adjusted. Feeling better. Now back on chemo (Taxol) - tomorrow is last treatment - gives her restless legs. Gets around pretty good. Goes to store without DOE. No orthopnea or PND. No dizziness. Taking lasix 40 daily.    Past Medical History:  Diagnosis Date  . Abnormal Pap smear 07/13/11   ASCUS +HPV  . Cancer [facility])    breast cancer  . Family history of breast cancer   . Family history of colon cancer   . Family history of throat cancer   . 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 digoxin (LANOXIN) 0.125 MG tablet Take 1 tablet (125 mcg total) by mouth daily. 30 tablet 11  . furosemide (LASIX) 20 MG tablet Take 1 tablet (20 mg total) by mouth daily. May take an additional  tablet daily as needed. 60 tablet 1  . lidocaine-prilocaine (EMLA) cream APPLY 1 APPLICATION TOPICALLY ONCE FOR 1 DOSE.    . sacubitril-valsartan (ENTRESTO) 97-103 MG Take 1 tablet by mouth 2 (two) times daily. 60 tablet 11  . spironolactone (ALDACTONE) 25 MG tablet Take 1 tablet (25 mg total) by mouth daily. 90 tablet 3   No current facility-administered medications for this encounter.    No Known Allergies    Social History   Socioeconomic History  . Marital status: Single    Spouse name: Not on file  . Number of children: Not on file  . Years of education: Not on file  . Highest education level: Not on file  Occupational History  . Not on file  Tobacco Use  . Smoking status: Former Smoker    Types: Cigarettes    Quit date: 02/27/2019    Years since quitting: 0.5  . Smokeless tobacco: Never Used  Substance and Sexual Activity  . Alcohol use: Not Currently  . Drug use: Yes    Frequency: 2.0 times per week    Types: Marijuana    Comment: 03/14/2018  . Sexual activity: Yes    Birth control/protection: None  Other Topics Concern  . Not on file  Social History Narrative  . Not on file   Social Determinants of Health   Financial Resource Strain:   . Difficulty of Paying Living Expenses:   Food Insecurity:   . Worried About Programme researcher, broadcasting/film/video in the Last Year:   . Barista in the Last Year:   Transportation Needs:   . Freight forwarder (Medical):   Marland Kitchen Lack of Transportation (Non-Medical):   Physical Activity:   . Days of Exercise per Week:   . Minutes of Exercise per Session:   Stress:   . Feeling of Stress :   Social Connections:   . Frequency of Communication with Friends and Family:   . Frequency of Social Gatherings with Friends and Family:   . Attends Religious Services:   . Active Member of Clubs or Organizations:   . Attends Banker Meetings:   Marland Kitchen Marital Status:   Intimate Partner Violence:   . Fear of Current or Ex-Partner:   .  Emotionally Abused:   Marland Kitchen Physically Abused:   . Sexually Abused:       Family History  Problem Relation Age of Onset  . Breast cancer Paternal Grandmother        dx. 76s or younger  . Stroke Father 15  . Hypertension Father   . Cancer Maternal Aunt        unknown type, dx. late 57s  . Colon cancer Paternal Uncle        dx 42s  . Throat cancer Maternal Grandmother        dx. 69s  . Cancer Paternal Uncle        unknown type, dx. 6s  . Breast cancer Cousin        dx. 30s, paternal 1st cousin    Vitals:   09/17/19 1357  BP: 118/76  Pulse: 68  SpO2: 98%  Weight: 86.2 kg (190 lb)    PHYSICAL EXAM: General:  Well appearing. No resp  difficulty HEENT: normal Neck: supple. no JVD. Carotids 2+ bilat; no bruits. No lymphadenopathy or thryomegaly appreciated. Cor: PMI nondisplaced. Regular rate & rhythm. No rubs, gallops or murmurs. + Port  Lungs: clear Abdomen: soft, nontender, nondistended. No hepatosplenomegaly. No bruits or masses. Good bowel sounds. Extremities: no cyanosis, clubbing, rash, trace edema Neuro: alert & orientedx3, cranial nerves grossly intact. moves all 4 extremities w/o difficulty. Affect pleasant   ASSESSMENT & PLAN: 1. Acute systolic HF - EF 10-31% suspect peri-partum vs adriamycin toxicity (hard to know which one) - Much improved NYHA I - Echo today 09/17/19 EF 40-45% RV ok  - Volume status looks good on lasix 40 daily - Continue Entresto 97/103 bid - Continue spiro 25 daily -  Stop digoxin - Start carvedilol 3.125 bid - Add Farxiga 10 - Stop lasix use just as needed - Discussed need for IUD or other forms of contraception. She said that cancer doctors told her that she can't get pregnant again. Asked her to confirm with them. I still recommended IUD  2. HTN - Blood pressure well controlled.  3. Breast CA - s/p treatment with taxol and adriamycin   Glori Bickers, MD  2:37 PM

## 2019-09-17 NOTE — Patient Instructions (Addendum)
Labs done today. We will contact you only if your labs are abnormal.  STOP taking Digoxin  START Carvdeilol 3.125mg (1 tablet) by mouth 2 times daily.  START Farxiga 10mg (1 tablet) by mouth daily.  Take Lasix 20mg (1 tablet) only a needed.  No other medication changes were made. Please continue all other medications as prescribed.   Your physician recommends that you schedule a follow-up appointment in: 2-3 months.  If you have any questions or concerns before your next appointment please send [place] a message through [place] or call our office at [phone].    TO LEAVE A MESSAGE FOR THE NURSE SELECT OPTION 2, PLEASE LEAVE A MESSAGE INCLUDING: . YOUR NAME . DATE OF BIRTH . CALL BACK NUMBER . REASON FOR CALL**this is important as we prioritize the call backs  [place] AS LONG AS YOU CALL BEFORE 4:00 PM   At the [facility], you and your health needs are our priority. As part of our continuing mission to provide you with exceptional heart care, we have created designated Provider Care Teams. These Care Teams include your primary Cardiologist (physician) and Advanced Practice Providers (APPs- Physician Assistants and Nurse Practitioners) who all work together to provide you with the care you need, when you need it.   You may see any of the following providers on your designated Care Team at your next follow up: Marland Kitchen Dr Glori Bickers . Dr Loralie Champagne . Darrick Grinder, NP . Lyda Jester, PA . Audry Riles, PharmD   Please be sure to bring in all your medications bottles to every appointment.

## 2019-09-18 ENCOUNTER — Inpatient Hospital Stay: Payer: Medicaid Other

## 2019-09-18 ENCOUNTER — Inpatient Hospital Stay: Payer: Medicaid Other | Admitting: Hematology and Oncology

## 2019-09-18 ENCOUNTER — Encounter: Payer: Self-pay | Admitting: *Deleted

## 2019-09-18 ENCOUNTER — Inpatient Hospital Stay (HOSPITAL_BASED_OUTPATIENT_CLINIC_OR_DEPARTMENT_OTHER): Payer: Medicaid Other | Admitting: Hematology and Oncology

## 2019-09-18 ENCOUNTER — Telehealth: Payer: Self-pay | Admitting: *Deleted

## 2019-09-18 ENCOUNTER — Other Ambulatory Visit: Payer: Self-pay

## 2019-09-18 DIAGNOSIS — C50812 Malignant neoplasm of overlapping sites of left female breast: Secondary | ICD-10-CM | POA: Diagnosis not present

## 2019-09-18 DIAGNOSIS — Z5111 Encounter for antineoplastic chemotherapy: Secondary | ICD-10-CM | POA: Diagnosis not present

## 2019-09-18 DIAGNOSIS — Z170 Estrogen receptor positive status [ER+]: Secondary | ICD-10-CM

## 2019-09-18 DIAGNOSIS — Z95828 Presence of other vascular implants and grafts: Secondary | ICD-10-CM

## 2019-09-18 LAB — CBC WITH DIFFERENTIAL (CANCER CENTER ONLY)
Abs Immature Granulocytes: 0.04 10*3/uL (ref 0.00–0.07)
Basophils Absolute: 0.1 10*3/uL (ref 0.0–0.1)
Basophils Relative: 1 %
Eosinophils Absolute: 0.1 10*3/uL (ref 0.0–0.5)
Eosinophils Relative: 2 %
HCT: 32.2 % — ABNORMAL LOW (ref 36.0–46.0)
Hemoglobin: 10.3 g/dL — ABNORMAL LOW (ref 12.0–15.0)
Immature Granulocytes: 1 %
Lymphocytes Relative: 40 %
Lymphs Abs: 1.6 10*3/uL (ref 0.7–4.0)
MCH: 30 pg (ref 26.0–34.0)
MCHC: 32 g/dL (ref 30.0–36.0)
MCV: 93.9 fL (ref 80.0–100.0)
Monocytes Absolute: 0.3 10*3/uL (ref 0.1–1.0)
Monocytes Relative: 8 %
Neutro Abs: 2 10*3/uL (ref 1.7–7.7)
Neutrophils Relative %: 48 %
Platelet Count: 248 10*3/uL (ref 150–400)
RBC: 3.43 MIL/uL — ABNORMAL LOW (ref 3.87–5.11)
RDW: 18.3 % — ABNORMAL HIGH (ref 11.5–15.5)
WBC Count: 4.1 10*3/uL (ref 4.0–10.5)
nRBC: 0 % (ref 0.0–0.2)

## 2019-09-18 LAB — CMP (CANCER CENTER ONLY)
ALT: 13 U/L (ref 0–44)
AST: 18 U/L (ref 15–41)
Albumin: 3.8 g/dL (ref 3.5–5.0)
Alkaline Phosphatase: 56 U/L (ref 38–126)
Anion gap: 9 (ref 5–15)
BUN: 13 mg/dL (ref 6–20)
CO2: 24 mmol/L (ref 22–32)
Calcium: 9.5 mg/dL (ref 8.9–10.3)
Chloride: 107 mmol/L (ref 98–111)
Creatinine: 0.79 mg/dL (ref 0.44–1.00)
GFR, Est AFR Am: >60 mL/min (ref >60)
GFR, Estimated: >60 mL/min (ref >60)
Glucose, Bld: 91 mg/dL (ref 70–99)
Potassium: 4 mmol/L (ref 3.5–5.1)
Sodium: 140 mmol/L (ref 135–145)
Total Bilirubin: 0.4 mg/dL (ref 0.3–1.2)
Total Protein: 6.6 g/dL (ref 6.5–8.1)

## 2019-09-18 MED ORDER — DIPHENHYDRAMINE HCL 50 MG/ML IJ SOLN
INTRAMUSCULAR | Status: AC
  Filled 2019-09-18: qty 1

## 2019-09-18 MED ORDER — SODIUM CHLORIDE 0.9 % IV SOLN
65.0000 mg/m2 | Freq: Once | INTRAVENOUS | Status: AC
  Administered 2019-09-18: 132 mg via INTRAVENOUS
  Filled 2019-09-18: qty 22

## 2019-09-18 MED ORDER — HEPARIN SOD (PORK) LOCK FLUSH 100 UNIT/ML IV SOLN
500.0000 [IU] | Freq: Once | INTRAVENOUS | Status: AC | PRN
  Administered 2019-09-18: 500 [IU]
  Filled 2019-09-18: qty 5

## 2019-09-18 MED ORDER — SODIUM CHLORIDE 0.9% FLUSH
10.0000 mL | INTRAVENOUS | Status: DC | PRN
  Administered 2019-09-18: 10 mL
  Filled 2019-09-18: qty 10

## 2019-09-18 MED ORDER — SODIUM CHLORIDE 0.9% FLUSH
10.0000 mL | Freq: Once | INTRAVENOUS | Status: AC
  Administered 2019-09-18: 10 mL
  Filled 2019-09-18: qty 10

## 2019-09-18 MED ORDER — FAMOTIDINE IN NACL 20-0.9 MG/50ML-% IV SOLN
INTRAVENOUS | Status: AC
  Filled 2019-09-18: qty 50

## 2019-09-18 MED ORDER — SODIUM CHLORIDE 0.9 % IV SOLN
Freq: Once | INTRAVENOUS | Status: AC
  Filled 2019-09-18: qty 250

## 2019-09-18 MED ORDER — DIPHENHYDRAMINE HCL 50 MG/ML IJ SOLN
25.0000 mg | Freq: Once | INTRAMUSCULAR | Status: AC
  Administered 2019-09-18: 25 mg via INTRAVENOUS

## 2019-09-18 MED ORDER — FAMOTIDINE IN NACL 20-0.9 MG/50ML-% IV SOLN
20.0000 mg | Freq: Once | INTRAVENOUS | Status: AC
  Administered 2019-09-18: 20 mg via INTRAVENOUS

## 2019-09-18 NOTE — Telephone Encounter (Signed)
Called pt to congratulate on completing chemo. Denies further needs or questions at this time. Confirmed mammo/US for 7/23. Denies further questions or needs at this time.

## 2019-09-18 NOTE — Patient Instructions (Signed)
[facility] Discharge Instructions for Patients Receiving Chemotherapy  Today you received the following chemotherapy agents Paclitaxel  To help prevent nausea and vomiting after your treatment, we encourage you to take your nausea medication as directed   If you develop nausea and vomiting that is not controlled by your nausea medication, call the clinic.   BELOW ARE SYMPTOMS THAT SHOULD BE REPORTED IMMEDIATELY:  *FEVER GREATER THAN 100.5 F  *CHILLS WITH OR WITHOUT FEVER  NAUSEA AND VOMITING THAT IS NOT CONTROLLED WITH YOUR NAUSEA MEDICATION  *UNUSUAL SHORTNESS OF BREATH  *UNUSUAL BRUISING OR BLEEDING  TENDERNESS IN MOUTH AND THROAT WITH OR WITHOUT PRESENCE OF ULCERS  *URINARY PROBLEMS  *BOWEL PROBLEMS  UNUSUAL RASH Items with * indicate a potential emergency and should be followed up as soon as possible.  Feel free to call the clinic should you have any questions or concerns. The clinic phone number is [phone].  Please show the CHEMO ALERT CARD at check-in to the Emergency Department and triage nurse.   

## 2019-09-18 NOTE — Assessment & Plan Note (Signed)
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 Neoadjuvant chemotherapy with dose dense Adriamycin and Cytoxan given every 3 weeks without Neulasta support given her pregnancycompleted 05/28/2019 2. Post-Partumweekly Taxol x12starting 07/03/2019 3. Followed by mastectomy and targeted node dissection versus full axillary lymph node dissection depending on the response. 4. Adjuvant radiation therapy 5. Followed by adjuvant antiestrogen therapy with complete estrogen blockade (ovarian suppression with AI) CT C/A/P on 5/13 shows sclerotic bone lesions, bone scan negative 6. 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 BHaroldine Lawsstarted her on Entresto and digoxin. Also on Lasix once a day.Continuing to show improvement in symptoms. 2.Chemo induced anemia: Hemoglobin is 10.4 Mammogram and ultrasound have been scheduled for 10/02/2019 Patient has appointments to see surgery.  Return to clinic after surgery to discuss the pathology report.

## 2019-09-21 ENCOUNTER — Other Ambulatory Visit: Payer: Medicaid Other

## 2019-09-22 ENCOUNTER — Telehealth: Payer: Self-pay | Admitting: Hematology and Oncology

## 2019-09-22 ENCOUNTER — Other Ambulatory Visit: Payer: Self-pay | Admitting: General Surgery

## 2019-09-22 DIAGNOSIS — C50812 Malignant neoplasm of overlapping sites of left female breast: Secondary | ICD-10-CM

## 2019-09-22 NOTE — Telephone Encounter (Signed)
No 7/9 los, no changes made to pt schedule

## 2019-09-24 ENCOUNTER — Encounter: Payer: Self-pay | Admitting: Licensed Clinical Social Worker

## 2019-09-24 NOTE — Progress Notes (Signed)
[facility] CSW Progress Note  Clinical Education officer, museum contacted patient by phone to provide ongoing emotional support. Patient has finished chemo and is due for scans and then surgery on 10/08/19. Discussed frustrations and worries today, like delay in scans due to insurance, worry about results, and plans for care for daughter while recovering from surgery. Her sister will care for the baby, but 31 yo may be with dad. Melissa [name] is trying to find alternatives since her daughter has helped her through this process and she does not want to be without her during recovery.    CSW provided empathic listening and worked with patient on identifying what is in and out of her control. Discussed ways to create action plans for those within control and ways to cope and manage reactions to things outside control. She is currently using prayer, enjoyable activities, and distraction. CSW also informed on grounding and breathing strategies to help in the moment.  CSW will call again prior to patient's surgery on 7/29.    Edwinna Areola Brando Taves , LCSW

## 2019-09-29 ENCOUNTER — Inpatient Hospital Stay (HOSPITAL_COMMUNITY): Admission: RE | Admit: 2019-09-29 | Payer: Medicaid Other | Source: Ambulatory Visit

## 2019-09-30 ENCOUNTER — Other Ambulatory Visit: Payer: Self-pay

## 2019-09-30 ENCOUNTER — Encounter (HOSPITAL_COMMUNITY)
Admission: RE | Admit: 2019-09-30 | Discharge: 2019-09-30 | Disposition: A | Discharge status: Home / Self Care | Payer: Medicaid Other | Source: Ambulatory Visit | Attending: General Surgery | Admitting: General Surgery

## 2019-09-30 ENCOUNTER — Encounter (HOSPITAL_COMMUNITY): Payer: Self-pay

## 2019-09-30 DIAGNOSIS — Z853 Personal history of malignant neoplasm of breast: Secondary | ICD-10-CM | POA: Diagnosis not present

## 2019-09-30 DIAGNOSIS — Z01810 Encounter for preprocedural cardiovascular examination: Secondary | ICD-10-CM | POA: Diagnosis present

## 2019-09-30 DIAGNOSIS — Z01812 Encounter for preprocedural laboratory examination: Secondary | ICD-10-CM | POA: Insufficient documentation

## 2019-09-30 DIAGNOSIS — Z9221 Personal history of antineoplastic chemotherapy: Secondary | ICD-10-CM | POA: Diagnosis not present

## 2019-09-30 DIAGNOSIS — I428 Other cardiomyopathies: Secondary | ICD-10-CM | POA: Diagnosis not present

## 2019-09-30 HISTORY — DX: Heart failure, unspecified: I50.9

## 2019-09-30 HISTORY — DX: Anemia, unspecified: D64.9

## 2019-09-30 LAB — CBC
HCT: 38.5 % (ref 36.0–46.0)
Hemoglobin: 12.1 g/dL (ref 12.0–15.0)
MCH: 30.8 pg (ref 26.0–34.0)
MCHC: 31.4 g/dL (ref 30.0–36.0)
MCV: 98 fL (ref 80.0–100.0)
Platelets: 283 10*3/uL (ref 150–400)
RBC: 3.93 MIL/uL (ref 3.87–5.11)
RDW: 18.3 % — ABNORMAL HIGH (ref 11.5–15.5)
WBC: 4.9 10*3/uL (ref 4.0–10.5)
nRBC: 0 % (ref 0.0–0.2)

## 2019-09-30 LAB — COMPREHENSIVE METABOLIC PANEL
ALT: 12 U/L (ref 0–44)
AST: 22 U/L (ref 15–41)
Albumin: 3.9 g/dL (ref 3.5–5.0)
Alkaline Phosphatase: 59 U/L (ref 38–126)
Anion gap: 8 (ref 5–15)
BUN: 14 mg/dL (ref 6–20)
CO2: 28 mmol/L (ref 22–32)
Calcium: 9.3 mg/dL (ref 8.9–10.3)
Chloride: 102 mmol/L (ref 98–111)
Creatinine, Ser: 0.84 mg/dL (ref 0.44–1.00)
GFR calc Af Amer: >60 mL/min (ref >60)
GFR calc non Af Amer: >60 mL/min (ref >60)
Glucose, Bld: 100 mg/dL — ABNORMAL HIGH (ref 70–99)
Potassium: 4.5 mmol/L (ref 3.5–5.1)
Sodium: 138 mmol/L (ref 135–145)
Total Bilirubin: 0.6 mg/dL (ref 0.3–1.2)
Total Protein: 7.3 g/dL (ref 6.5–8.1)

## 2019-09-30 LAB — POCT PREGNANCY, URINE: Preg Test, Ur: NEGATIVE

## 2019-09-30 NOTE — Progress Notes (Addendum)
PCP - per patient, does not currently have a PCP, but is set up to see [name], NP soon. Cardiologist - Glori Bickers, MD Oncologist- Nicholas Lose, MD  PPM/ICD - Denies  Chest x-ray - 03/18/19 (1-view) EKG - 07/03/19 Stress Test - Denies ECHO - 09/17/19 Cardiac Cath - Denies  Sleep Study - Denies  Patient denies being diabetic.  Blood Thinner Instructions: N/A Aspirin Instructions: N/A  ERAS Protcol - Yes PRE-SURGERY Ensure or G2- Ensure given  COVID TEST- 10/05/19   Anesthesia review: Yes, review ECHO, Active Chemo tx.  Patient denies shortness of breath, fever, cough and chest pain at PAT appointment   All instructions explained to the patient, with a verbal understanding of the material. Patient agrees to go over the instructions while at home for a better understanding. Patient also instructed to self quarantine after being tested for COVID-19. The opportunity to ask questions was provided.

## 2019-09-30 NOTE — Pre-Procedure Instructions (Signed)
Your procedure is scheduled on Thursday, July 29, from 1:30 PM- 4:34 PM.  Report to [facility] Main Entrance "A" at 11:30 A.M., and check in at the Admitting office.  Call this number if you have problems the morning of surgery:  [phone]  Call [phone] if you have any questions prior to your surgery date Monday-Friday 8am-4pm.    Remember:  Do not eat after midnight the night before your surgery.  You may drink clear liquids until 10:30 AM the morning of your surgery.   Clear liquids allowed are: Water, Non-Citrus Juices (without pulp), Carbonated Beverages, Clear Tea, Black Coffee Only, and Gatorade.    The day of surgery (if you do NOT have diabetes):  o Drink ONE (1) Pre-Surgery Clear Ensure by 10:30 AM the morning of surgery.   o This drink was given to you during your hospital  pre-op appointment visit. o Nothing else to drink after completing the  Pre-Surgery Clear Ensure.          If you have questions, please contact your surgeons office.     Take these medicines the morning of surgery with A SIP OF WATER: carvedilol (COREG)    *DO NOT TAKE dapagliflozin propanediol (FARXIGA) the DAY BEFORE OR THE MORNING OF SURGERY.   As of today, STOP taking any Aspirin (unless otherwise instructed by your surgeon) Aleve, Naproxen, Ibuprofen, Motrin, Advil, Goody's, BC's, all herbal medications, fish oil, and all vitamins.        The Morning of Surgery:              Do not wear jewelry, make up, or nail polish.            Do not wear lotions, powders, perfumes, or deodorant.            Do not shave 48 hours prior to surgery.              Do not bring valuables to the hospital.            [facility] is not responsible for any belongings or valuables.  Do NOT Smoke (Tobacco/Vaping) or drink Alcohol 24 hours prior to your procedure.  If you use a CPAP at night, you may bring all equipment for your overnight stay.   Contacts, glasses, dentures or bridgework may  not be worn into surgery.      For patients admitted to the hospital, discharge time will be determined by your treatment team.   Patients discharged the day of surgery will not be allowed to drive home, and someone needs to stay with them for 24 hours.    Special instructions:   [facility]- Preparing For Surgery  Before surgery, you can play an important role. Because skin is not sterile, your skin needs to be as free of germs as possible. You can reduce the number of germs on your skin by washing with CHG (chlorahexidine gluconate) Soap before surgery.  CHG is an antiseptic cleaner which kills germs and bonds with the skin to continue killing germs even after washing.    Oral Hygiene is also important to reduce your risk of infection.  Remember - BRUSH YOUR TEETH THE MORNING OF SURGERY WITH YOUR REGULAR TOOTHPASTE  Please do not use if you have an allergy to CHG or antibacterial soaps. If your skin becomes reddened/irritated stop using the CHG.  Do not shave (including legs and underarms) for at least 48 hours prior to first CHG shower. It  is OK to shave your face.  Please follow these instructions carefully.   1. Shower the NIGHT BEFORE SURGERY and the MORNING OF SURGERY with CHG Soap.   2. If you chose to wash your hair, wash your hair first as usual with your normal shampoo.  3. After you shampoo, rinse your hair and body thoroughly to remove the shampoo.  4. Use CHG as you would any other liquid soap. You can apply CHG directly to the skin and wash gently with a scrungie or a clean washcloth.   5. Apply the CHG Soap to your body ONLY FROM THE NECK DOWN.  Do not use on open wounds or open sores. Avoid contact with your eyes, ears, mouth and genitals (private parts). Wash Face and genitals (private parts)  with your normal soap.   6. Wash thoroughly, paying special attention to the area where your surgery will be performed.  7. Thoroughly rinse your body with warm water from the  neck down.  8. DO NOT shower/wash with your normal soap after using and rinsing off the CHG Soap.  9. Pat yourself dry with a CLEAN TOWEL.  10. Wear CLEAN PAJAMAS to bed the night before surgery  11. Place CLEAN SHEETS on your bed the night of your first shower and DO NOT SLEEP WITH PETS.   Day of Surgery: Wear Clean/Comfortable clothing the morning of surgery Do not apply any deodorants/lotions.   Remember to brush your teeth WITH YOUR REGULAR TOOTHPASTE.   Please read over the following fact sheets that you were given.

## 2019-10-01 NOTE — Anesthesia Preprocedure Evaluation (Addendum)
Anesthesia Evaluation  Patient identified by MRN, date of birth, ID band Patient awake    Reviewed: Allergy & Precautions, NPO status , Patient's Chart, lab work & pertinent test results  Airway Mallampati: II  TM Distance: >3 FB Neck ROM: Full    Dental  (+) Teeth Intact, Dental Advisory Given   Pulmonary former smoker,    breath sounds clear to auscultation       Cardiovascular +CHF   Rhythm:Regular Rate:Normal     Neuro/Psych negative neurological ROS  negative psych ROS   GI/Hepatic negative GI ROS, Neg liver ROS,   Endo/Other  negative endocrine ROS  Renal/GU negative Renal ROS     Musculoskeletal   Abdominal Normal abdominal exam  (+)   Peds  Hematology negative hematology ROS (+)   Anesthesia Other Findings   Reproductive/Obstetrics                            Anesthesia Physical Anesthesia Plan  ASA: II  Anesthesia Plan: General   Post-op Pain Management:    Induction: Intravenous  PONV Risk Score and Plan: 4 or greater and Ondansetron, Dexamethasone, Midazolam and Scopolamine patch - Pre-op  Airway Management Planned: Oral ETT  Additional Equipment: None  Intra-op Plan:   Post-operative Plan: Extubation in OR  Informed Consent: I have reviewed the patients History and Physical, chart, labs and discussed the procedure including the risks, benefits and alternatives for the proposed anesthesia with the patient or authorized representative who has indicated his/her understanding and acceptance.     Dental advisory given  Plan Discussed with: CRNA  Anesthesia Plan Comments: (PAT note by Karoline Caldwell, PA-C: Patient is being followed by Dr. 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 Diagnosed with left breast CA in 12/20 when she was 4 months pregnant with her second child. No other complications except  pre-term labor. Echo 03/17/19: EF 60% RV normal. While pregnant she was treated with 4 cycles ofneoadjuvant chemotherapy with dose dense Adriamycin and Cytoxan. She delivered her baby girl on 06/19/19. She did not have problems with HTN or pre-eclampsia. She presented to the ED on 06/27/19 for lower extremity edema and shortness of breath. CT was negative for PE and she was discharged with 5 days of Lasix 20mg . Echo on 07/02/19 showed a severely decreased ejection fraction, <20%.She has been following with cardiology PharmD and HF meds adjusted. Feeling better. Now back on chemo (Taxol).  Last seen by Dr. Britta Mccreedy on 09/17/2019 and repeat echo at that time showed EF 40-45 % (per his note).  He noted she was much improved NYHA I.  Preop labs reviewed, unremarkable.  EKG 07/03/2019:Sinus tachycardia.  Rate 117. Possible Anterolateral infarct , age undetermined  TTE 09/17/2019: 1. Left ventricular ejection fraction, by estimation, is 35 to 40%. The  left ventricle has moderately decreased function. The left ventricle  demonstrates global hypokinesis. The left ventricular internal cavity size  was mildly dilated. Left ventricular  diastolic parameters are consistent with Grade I diastolic dysfunction  (impaired relaxation).  2. Right ventricular systolic function is normal. The right ventricular  size is mildly enlarged. There is normal pulmonary artery systolic  pressure. The estimated right ventricular systolic pressure is 54.6 mmHg.  3. The mitral valve is grossly normal. Trivial mitral valve  regurgitation. No evidence of mitral stenosis.  4. The aortic valve is tricuspid. Aortic valve regurgitation is not  visualized. No aortic stenosis is  present.  5. The inferior vena cava is normal in size with greater than 50%  respiratory variability, suggesting right atrial pressure of 3 mmHg.   Comparison(s): Changes from prior study are noted. EF has improved to  35-40%.  )       Anesthesia Quick  Evaluation

## 2019-10-01 NOTE — Progress Notes (Signed)
Anesthesia Chart Review:  Patient is being followed by Dr. 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 Diagnosed with left breast CA in 12/20 when she was 4 months pregnant with her second child. No other complications except pre-term labor. Echo 03/17/19: EF 60% RV normal. While pregnant she was treated with 4 cycles ofneoadjuvant chemotherapy with dose dense Adriamycin and Cytoxan. She delivered her baby girl on 06/19/19. She did not have problems with HTN or pre-eclampsia. She presented to the ED on 06/27/19 for lower extremity edema and shortness of breath. CT was negative for PE and she was discharged with 5 days of Lasix 20mg . Echo on 07/02/19 showed a severely decreased ejection fraction, <20%.She has been following with cardiology PharmD and HF meds adjusted. Feeling better. Now back on chemo (Taxol).  Last seen by Dr. Britta Mccreedy on 09/17/2019 and repeat echo at that time showed EF 40-45 % (per his note).  He noted she was much improved NYHA I.  Preop labs reviewed, unremarkable.  EKG 07/03/2019:Sinus tachycardia.  Rate 117. Possible Anterolateral infarct , age undetermined  TTE 09/17/2019: 1. Left ventricular ejection fraction, by estimation, is 35 to 40%. The  left ventricle has moderately decreased function. The left ventricle  demonstrates global hypokinesis. The left ventricular internal cavity size  was mildly dilated. Left ventricular  diastolic parameters are consistent with Grade I diastolic dysfunction  (impaired relaxation).  2. Right ventricular systolic function is normal. The right ventricular  size is mildly enlarged. There is normal pulmonary artery systolic  pressure. The estimated right ventricular systolic pressure is 97.9 mmHg.  3. The mitral valve is grossly normal. Trivial mitral valve  regurgitation. No evidence of mitral stenosis.  4. The aortic valve is tricuspid. Aortic valve regurgitation is not  visualized.  No aortic stenosis is present.  5. The inferior vena cava is normal in size with greater than 50%  respiratory variability, suggesting right atrial pressure of 3 mmHg.   Comparison(s): Changes from prior study are noted. EF has improved to  35-40%.    Wynonia Musty [facility] Short Stay Center/Anesthesiology Phone [phone] 10/01/2019 4:09 PM

## 2019-10-02 ENCOUNTER — Other Ambulatory Visit: Payer: Self-pay

## 2019-10-02 ENCOUNTER — Encounter: Payer: Self-pay | Admitting: *Deleted

## 2019-10-05 ENCOUNTER — Other Ambulatory Visit (HOSPITAL_COMMUNITY)
Admission: RE | Admit: 2019-10-05 | Discharge: 2019-10-05 | Disposition: A | Discharge status: Home / Self Care | Payer: Medicaid Other | Source: Ambulatory Visit | Attending: General Surgery | Admitting: General Surgery

## 2019-10-05 DIAGNOSIS — Z20822 Contact with and (suspected) exposure to covid-19: Secondary | ICD-10-CM | POA: Diagnosis not present

## 2019-10-05 DIAGNOSIS — Z01812 Encounter for preprocedural laboratory examination: Secondary | ICD-10-CM | POA: Insufficient documentation

## 2019-10-05 DIAGNOSIS — S62639A Displaced fracture of distal phalanx of unspecified finger, initial encounter for closed fracture: Secondary | ICD-10-CM

## 2019-10-05 HISTORY — DX: Displaced fracture of distal phalanx of unspecified finger, initial encounter for closed fracture: S62.639A

## 2019-10-05 LAB — SARS CORONAVIRUS 2 (TAT 6-24 HRS): SARS Coronavirus 2: NEGATIVE

## 2019-10-06 ENCOUNTER — Telehealth: Payer: Self-pay | Admitting: Licensed Clinical Social Worker

## 2019-10-06 NOTE — Telephone Encounter (Signed)
[place] [facility] Social Work  CSW called patient to offer ongoing support prior to pt's surgery on Thursday. Patient unable to speak at this time, said she would call this CSW back later when she had more time.   Edwinna Areola Shania Bjelland, LCSW

## 2019-10-07 ENCOUNTER — Other Ambulatory Visit: Payer: Self-pay | Admitting: General Surgery

## 2019-10-07 ENCOUNTER — Other Ambulatory Visit: Payer: Self-pay

## 2019-10-07 ENCOUNTER — Ambulatory Visit
Admission: RE | Admit: 2019-10-07 | Discharge: 2019-10-07 | Disposition: A | Discharge status: Home / Self Care | Payer: Medicaid Other | Source: Ambulatory Visit | Attending: General Surgery | Admitting: General Surgery

## 2019-10-07 DIAGNOSIS — Z170 Estrogen receptor positive status [ER+]: Secondary | ICD-10-CM

## 2019-10-07 DIAGNOSIS — C50812 Malignant neoplasm of overlapping sites of left female breast: Secondary | ICD-10-CM

## 2019-10-07 IMAGING — US US PLC BREAST LOC DEV 1ST LESION INC US GUIDE*L*
1 series · 6 of 6 positions shown · non-contrast
Comparison: Previous exam(s).

CLINICAL DATA: Pre bilateral mastectomy localization of a left
axillary lymph node biopsy on [DATE], demonstrating a metastatic
node. The patient has recently completed neoadjuvant chemotherapy.

EXAM:
ULTRASOUND GUIDED RADIOACTIVE SEED LOCALIZATION OF THE LEFT AXILLA

[Series 1: us plc breast loc dev 1st lesion inc us guide*left · 0.06mm/px · 6 of 6 slices shown]
[im 1/6]
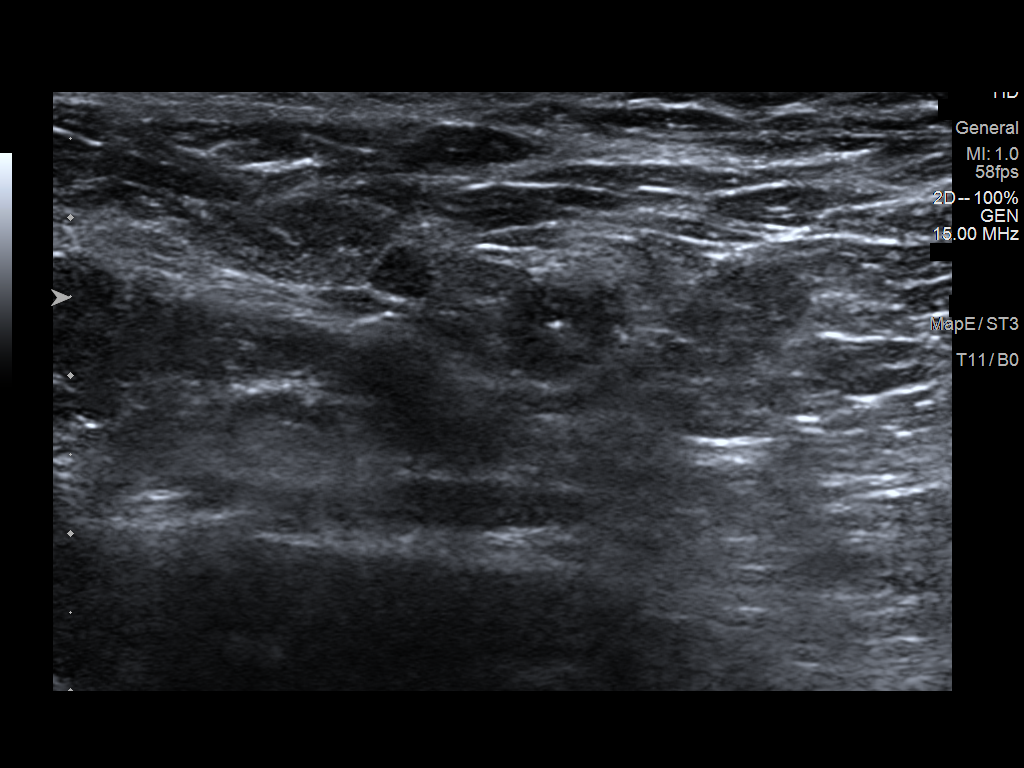
[im 2/6]
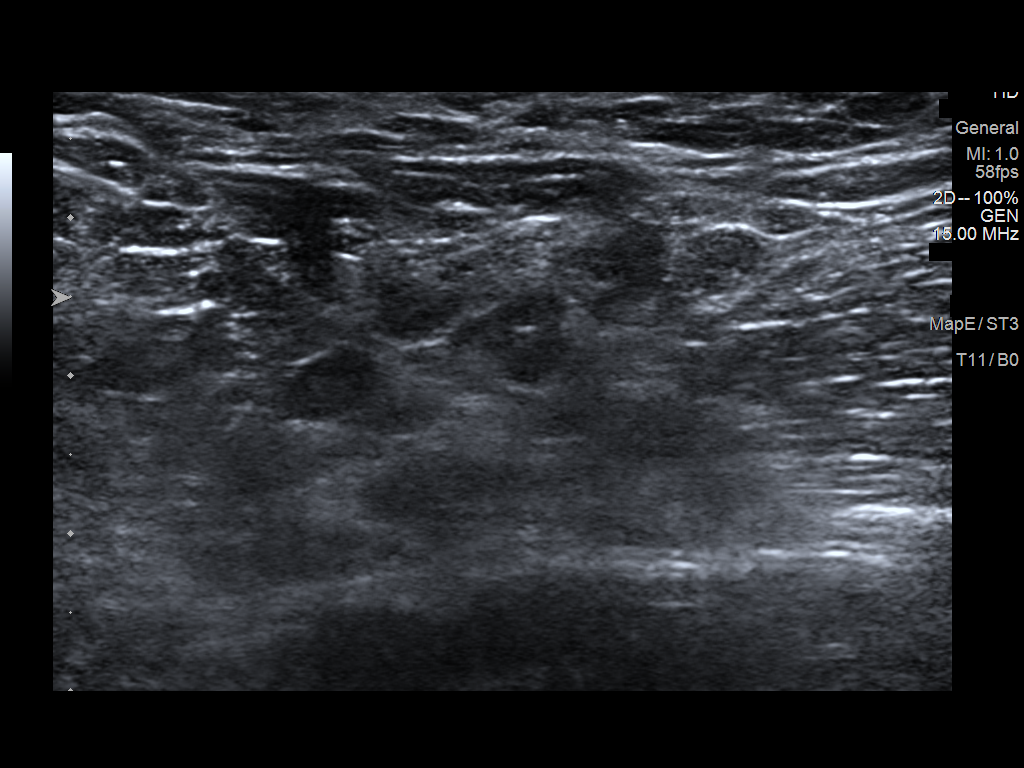
[im 3/6]
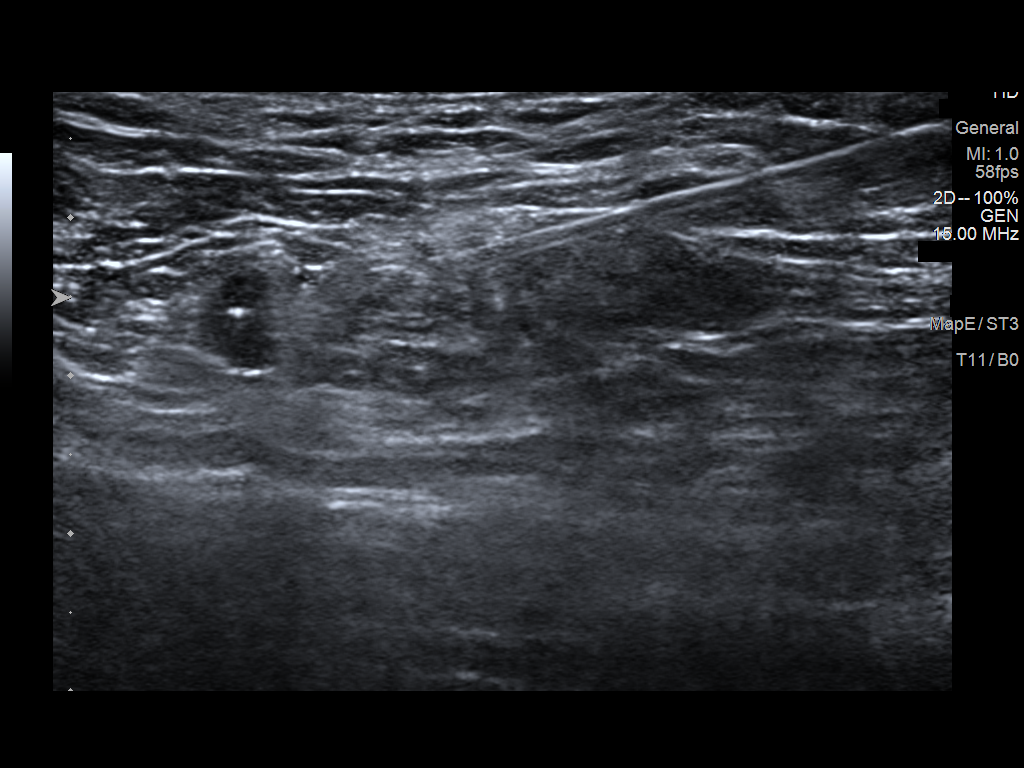
[im 4/6]
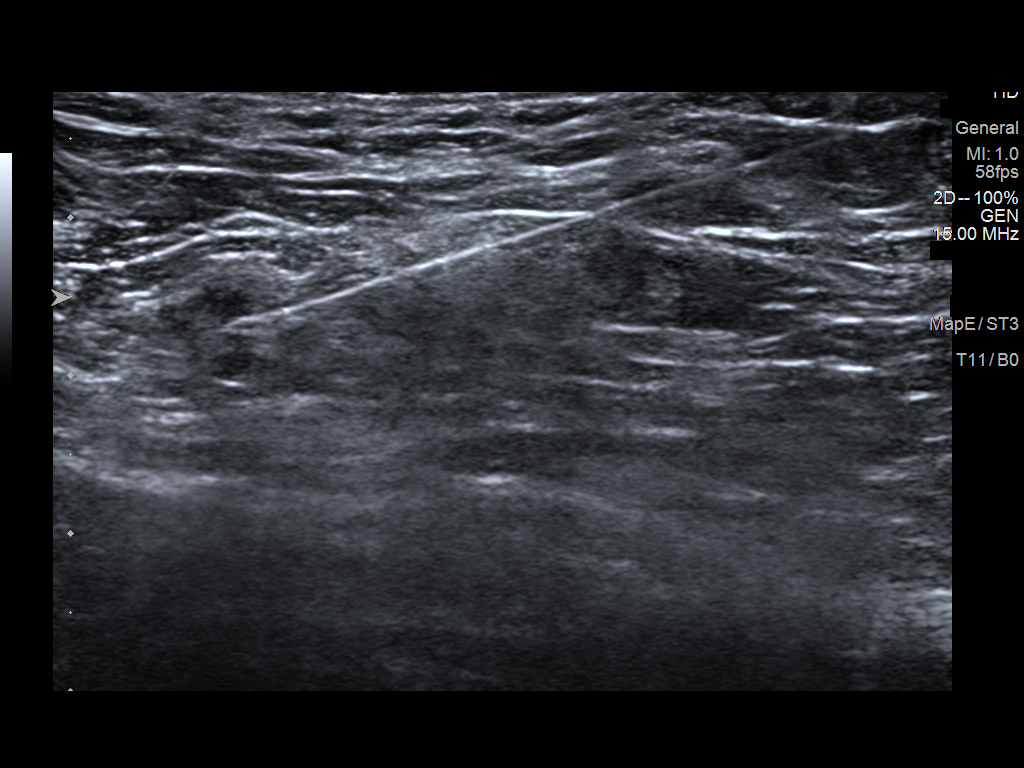
[im 5/6]
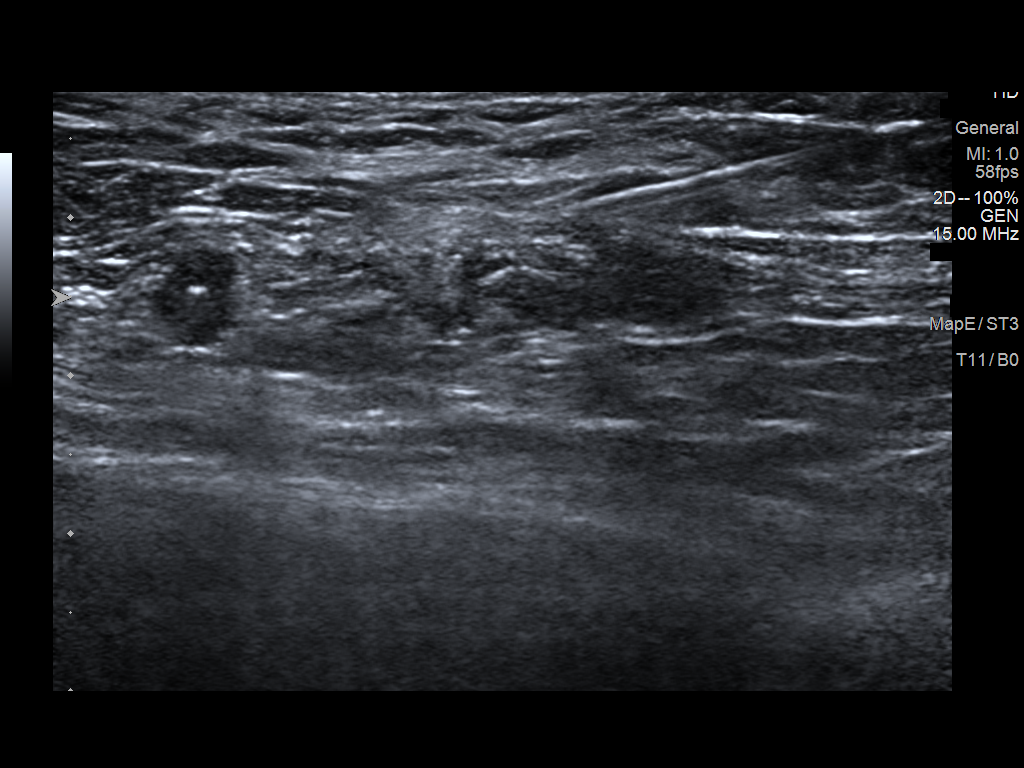
[im 6/6]
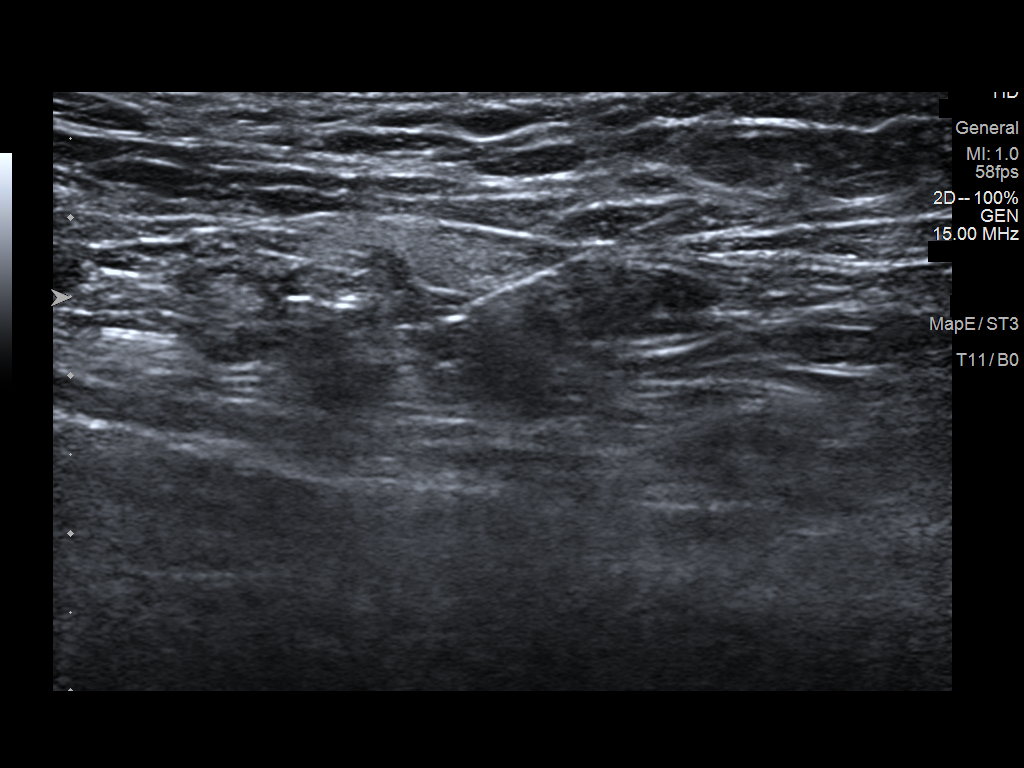

[6 of 6 positions shown; findings below may reference images not displayed]

FINDINGS: Patient presents for radioactive seed localization prior to
bilateral mastectomy and left axillary lymph node resection. I met
with the patient and we discussed the procedure of seed localization
including benefits and alternatives. We discussed the high
likelihood of a successful procedure. We discussed the risks of the
procedure including infection, bleeding, tissue injury and further
surgery. We discussed the low dose of radioactivity involved in the
procedure. Informed, written consent was given.

The usual time-out protocol was performed immediately prior to the
procedure.

Preliminary ultrasound evaluation of the left axilla demonstrated
multiple normal appearing left axillary lymph nodes. There was 1
node with a small linear, brightly echogenic focus at the same
location as the HydroMARK clip seen in a similar appearing lymph
node at time of biopsy on [DATE].

Using ultrasound guidance, sterile technique, 1% lidocaine and an
[93] radioactive seed, the lymph nodes seen with the small linear
right echogenic focus in the left axilla was localized using an
inferolateral approach. The follow-up mammogram images confirm the
seed in the expected location and were marked for Dr. EVY.

Follow-up survey of the patient confirms presence of the radioactive
seed.

Order number of [93] seed:  [PHONE_NUMBER].

Total activity:  0.233 mCi reference Date: [DATE]

A post procedure 3D spot compression mammogram the left axilla was
obtained in the oblique projection. The radioactive seed is located
in a lymph node 3 cm superior to the HydroMARK clip in the
previously biopsied lymph node.

The patient tolerated the procedure well and was released from the
[REDACTED]. She was given instructions regarding seed removal.
IMPRESSION: Radioactive seed localization left axilla. The radioactive seed is
located in a lymph node 3 cm superior to the previously biopsied
lymph node containing the HydroMARK biopsy marker clip. Therefore,
at the time of resection of the lymph node containing the seed,
resection of the nodes more inferiorly in the left axilla is
recommended. This has been discussed with Dr. EVY. No apparent
complications.

## 2019-10-07 IMAGING — MG MM BREAST LOCALIZATION CLIP
4 series · 4 of 12 positions shown · non-contrast
Comparison: Previous exam(s).

CLINICAL DATA: Status post ultrasound-guided left axillary
radioactive seed placement.

EXAM:
DIAGNOSTIC LEFT MAMMOGRAM POST ULTRASOUND-GUIDED RADIOACTIVE SEED
PLACEMENT

[L TAN synth-2D (1 of 2)]
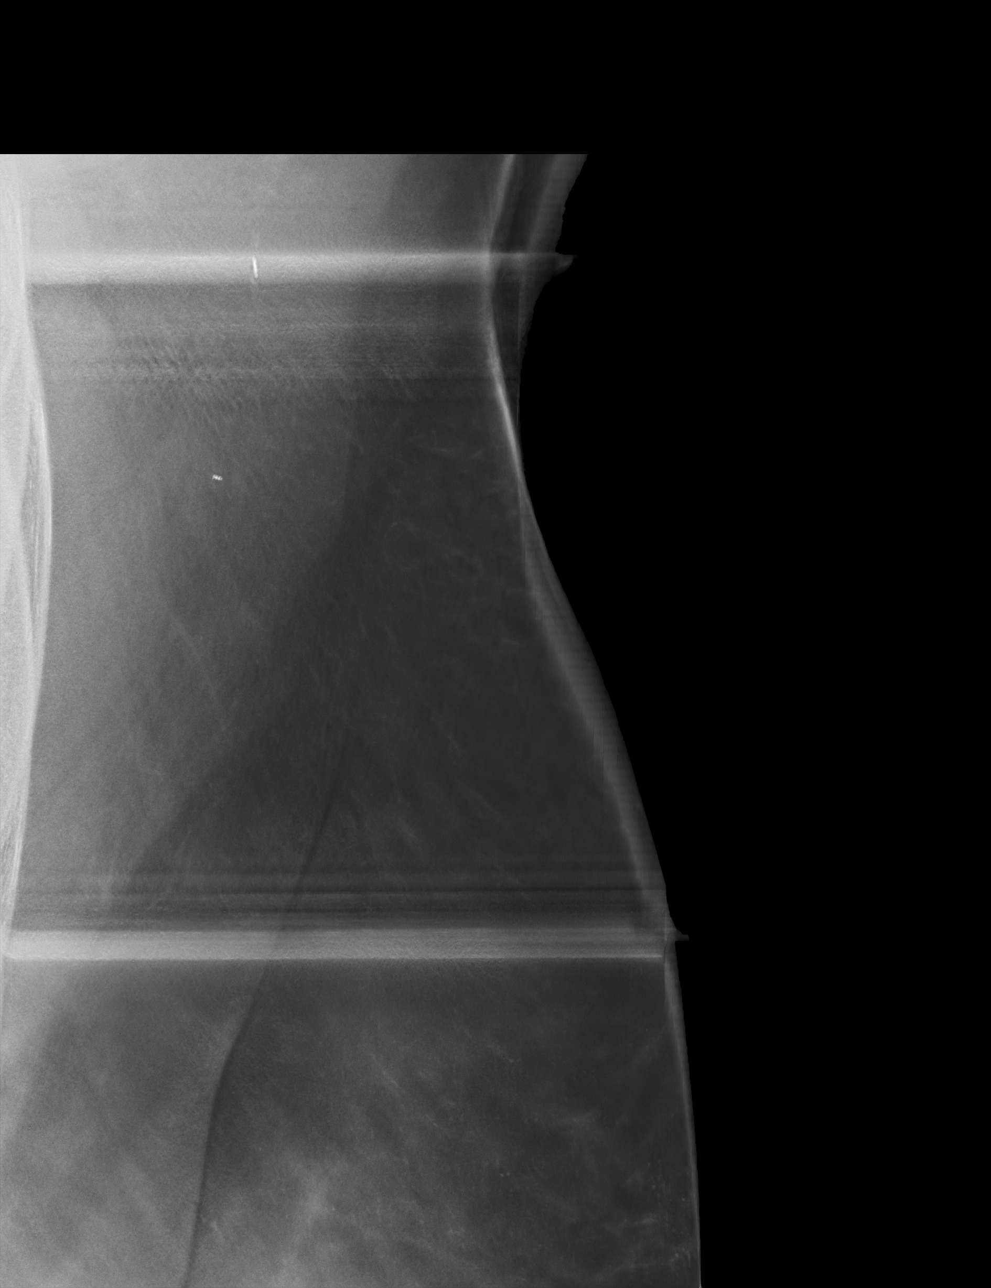

[L TAN synth-2D (2 of 2)]
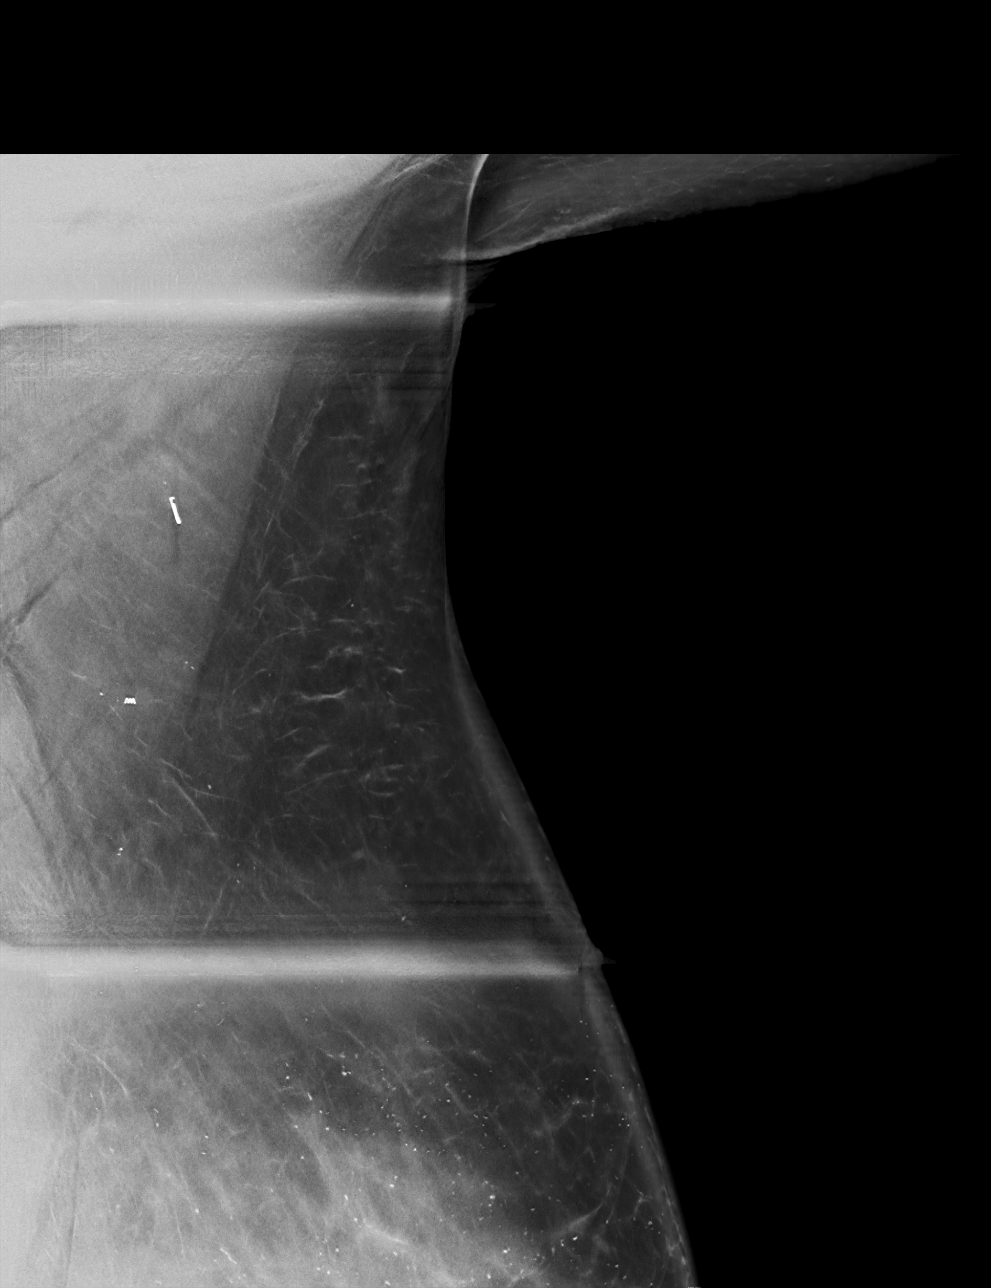

[L TAN tomo (1 of 2) · tomo slice 51/101.0]
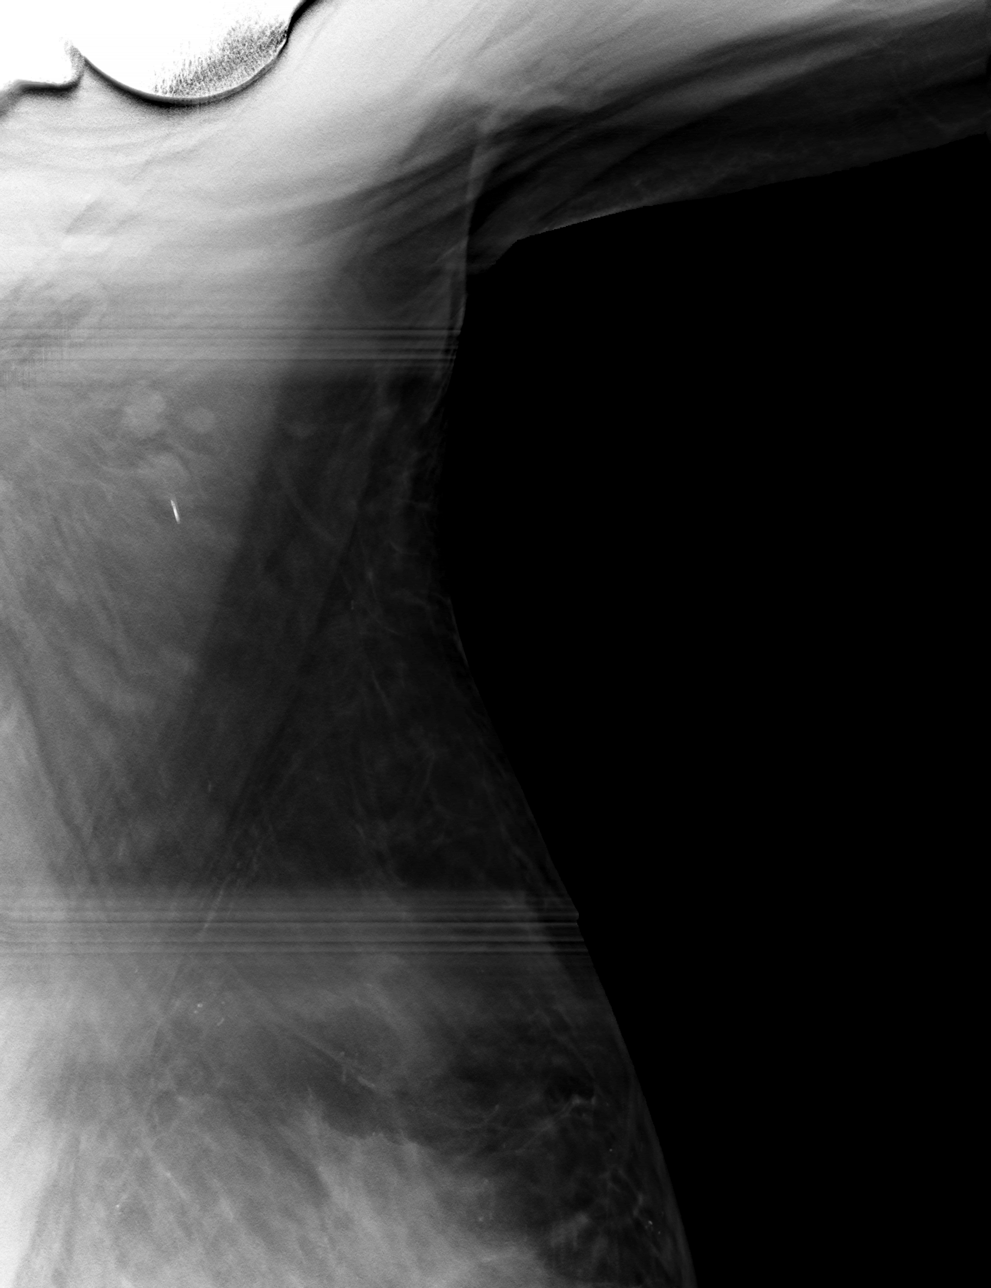

[L TAN tomo (2 of 2) · tomo slice 63/125.0]
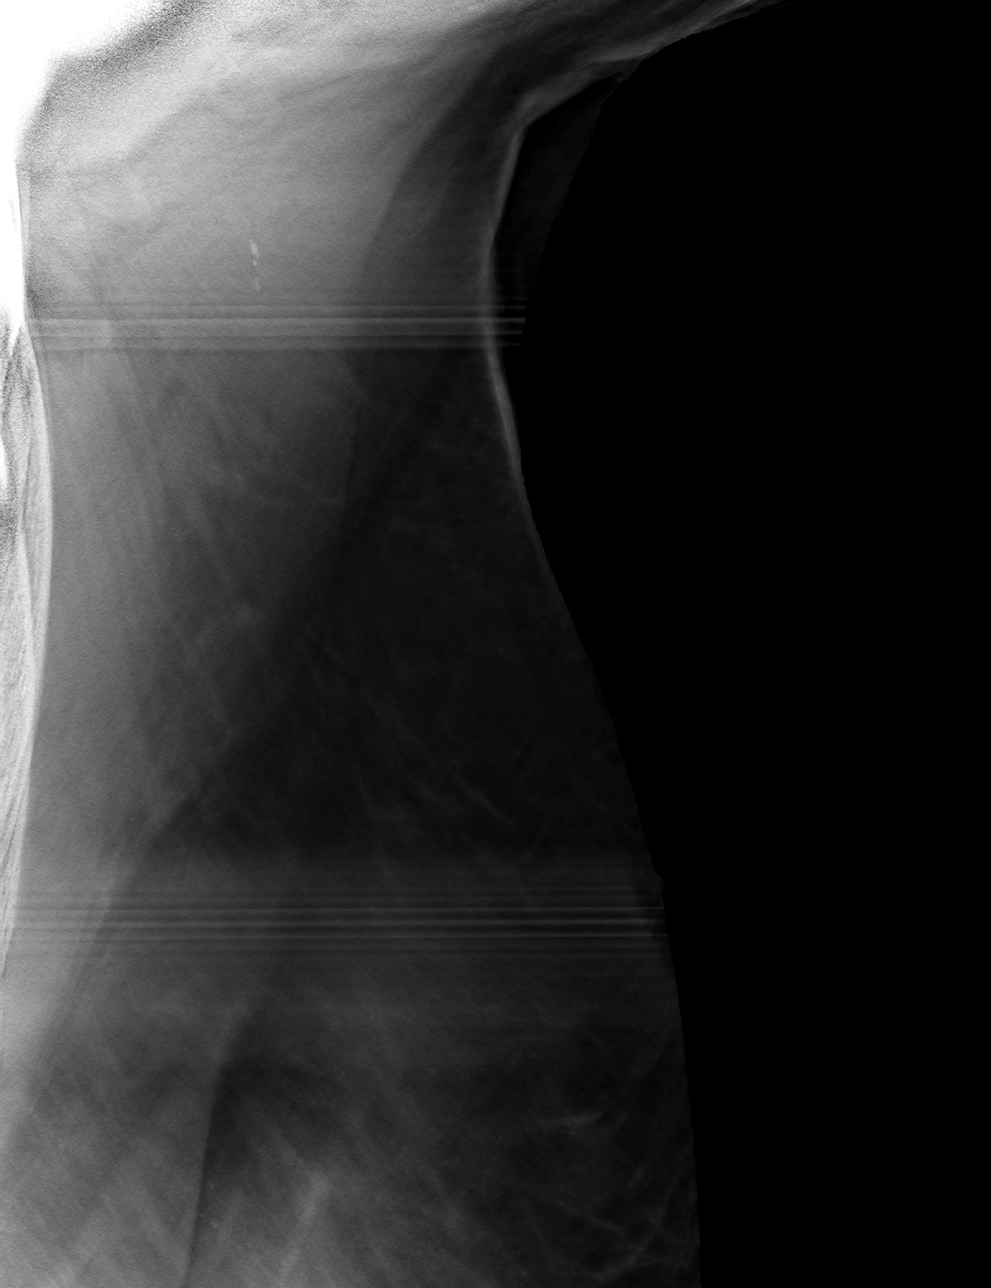

[4 of 12 positions shown; findings below may reference images not displayed]

FINDINGS: Mammographic images were obtained following ultrasound-guided
radioactive seed placement. These demonstrate the radioactive seed 3
cm superior to the spiral shaped HydroMARK biopsy marker clip.
IMPRESSION: Radioactive seed 3 cm superior to the spiral shaped HydroMARK biopsy
marker clip. Therefore, the localized node containing a bright
echogenic focus does not correspond to the previously biopsied node.
This was discussed with Dr. BASSNT following the procedure with a
recommendation for surgical excision of the left axillary nodes
inferior to the seed, in addition to the node containing the seed.

Final Assessment: Post Procedure Mammograms for Seed Placement

## 2019-10-08 ENCOUNTER — Ambulatory Visit
Admission: RE | Admit: 2019-10-08 | Discharge: 2019-10-08 | Disposition: A | Discharge status: Home / Self Care | Payer: Medicaid Other | Source: Ambulatory Visit | Attending: General Surgery | Admitting: General Surgery

## 2019-10-08 ENCOUNTER — Observation Stay (HOSPITAL_COMMUNITY)
Admission: RE | Admit: 2019-10-08 | Discharge: 2019-10-09 | Disposition: A | Discharge status: Home / Self Care | Payer: Medicaid Other | Attending: General Surgery | Admitting: General Surgery

## 2019-10-08 ENCOUNTER — Encounter (HOSPITAL_COMMUNITY): Payer: Self-pay | Admitting: General Surgery

## 2019-10-08 ENCOUNTER — Ambulatory Visit (HOSPITAL_COMMUNITY)
Admission: RE | Admit: 2019-10-08 | Discharge: 2019-10-08 | Disposition: A | Discharge status: Home / Self Care | Payer: Medicaid Other | Source: Ambulatory Visit | Attending: General Surgery | Admitting: General Surgery

## 2019-10-08 ENCOUNTER — Ambulatory Visit (HOSPITAL_COMMUNITY): Payer: Medicaid Other | Admitting: Certified Registered Nurse Anesthetist

## 2019-10-08 ENCOUNTER — Encounter: Payer: Self-pay | Admitting: *Deleted

## 2019-10-08 ENCOUNTER — Other Ambulatory Visit: Payer: Self-pay

## 2019-10-08 ENCOUNTER — Ambulatory Visit (HOSPITAL_COMMUNITY): Payer: Medicaid Other | Admitting: Physician Assistant

## 2019-10-08 ENCOUNTER — Encounter (HOSPITAL_COMMUNITY)
Admission: RE | Disposition: A | Discharge status: Home / Self Care | Payer: Self-pay | Source: Home / Self Care | Attending: General Surgery

## 2019-10-08 DIAGNOSIS — O9229 Other disorders of breast associated with pregnancy and the puerperium: Secondary | ICD-10-CM | POA: Diagnosis not present

## 2019-10-08 DIAGNOSIS — C50911 Malignant neoplasm of unspecified site of right female breast: Secondary | ICD-10-CM | POA: Insufficient documentation

## 2019-10-08 DIAGNOSIS — Z3A20 20 weeks gestation of pregnancy: Secondary | ICD-10-CM | POA: Diagnosis not present

## 2019-10-08 DIAGNOSIS — Z170 Estrogen receptor positive status [ER+]: Secondary | ICD-10-CM

## 2019-10-08 DIAGNOSIS — Z87891 Personal history of nicotine dependence: Secondary | ICD-10-CM | POA: Diagnosis not present

## 2019-10-08 DIAGNOSIS — C50912 Malignant neoplasm of unspecified site of left female breast: Secondary | ICD-10-CM | POA: Diagnosis not present

## 2019-10-08 DIAGNOSIS — C50812 Malignant neoplasm of overlapping sites of left female breast: Secondary | ICD-10-CM

## 2019-10-08 HISTORY — PX: RADIOACTIVE SEED GUIDED AXILLARY SENTINEL LYMPH NODE: SHX6735

## 2019-10-08 HISTORY — PX: MASTECTOMY: SHX3

## 2019-10-08 HISTORY — PX: MASTECTOMY W/ SENTINEL NODE BIOPSY: SHX2001

## 2019-10-08 LAB — TYPE AND SCREEN
ABO/RH(D): B POS
Antibody Screen: NEGATIVE

## 2019-10-08 LAB — ABO/RH: ABO/RH(D): B POS

## 2019-10-08 IMAGING — MG MM BREAST SURGICAL SPECIMEN
2 series · 2 of 2 positions shown · non-contrast
Comparison: Previous exam(s).

CLINICAL DATA: Post left axilla excision. Note that the radioactive
seed was located in a lymph node 3 cm superior to the HydroMARK clip
in the previously biopsied lymph node.

EXAM:
SPECIMEN RADIOGRAPH OF THE LEFT BREAST

[L (1 of 2)]
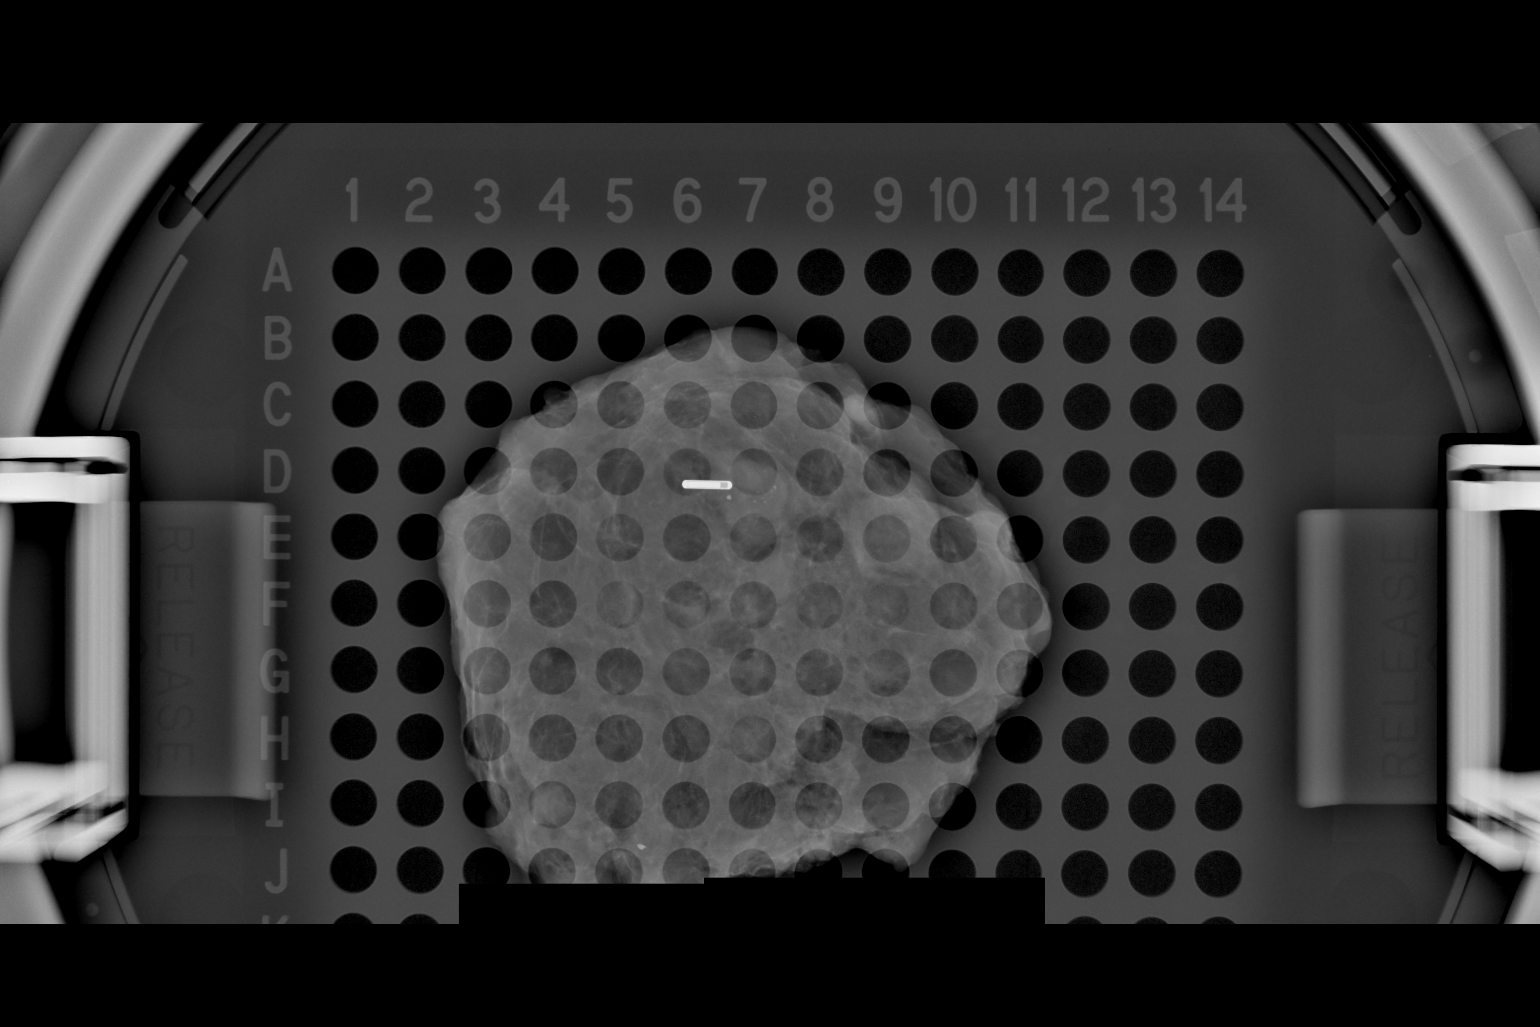

[L (2 of 2)]
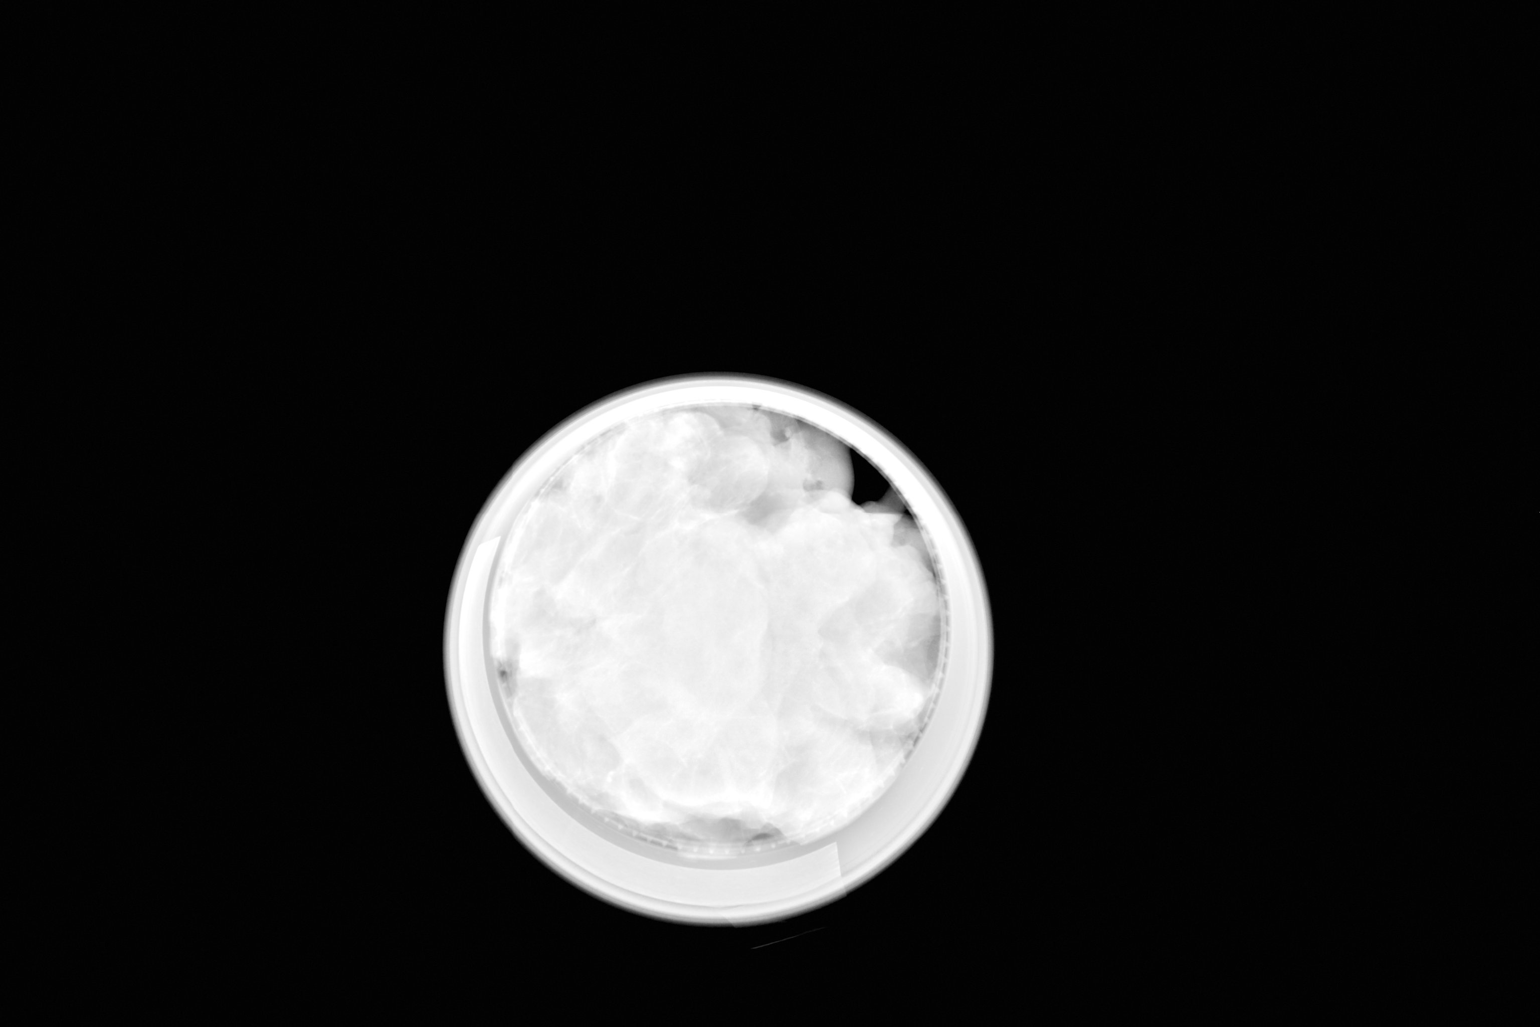

[2 of 2 positions shown; findings below may reference images not displayed]

FINDINGS: Status post excision of the left breast. The radioactive seed only
is present, completely intact, and marked for pathology.
IMPRESSION: Specimen radiograph of the left breast.

## 2019-10-08 SURGERY — MASTECTOMY WITH SENTINEL LYMPH NODE BIOPSY
Anesthesia: General | Site: Breast | Laterality: Left

## 2019-10-08 MED ORDER — SODIUM CHLORIDE 0.9 % IV SOLN: INTRAVENOUS | Status: DC

## 2019-10-08 MED ORDER — ACETAMINOPHEN 160 MG/5ML PO SOLN: 325.0000 mg | Freq: Once | ORAL | Status: DC | PRN

## 2019-10-08 MED ORDER — PROPOFOL 10 MG/ML IV BOLUS
INTRAVENOUS | Status: AC
  Filled 2019-10-08: qty 20

## 2019-10-08 MED ORDER — PHENYLEPHRINE HCL-NACL 10-0.9 MG/250ML-% IV SOLN
INTRAVENOUS | Status: DC | PRN
Start: 2019-10-08 — End: 2019-10-08
  Administered 2019-10-08: 25 ug/min via INTRAVENOUS

## 2019-10-08 MED ORDER — CARVEDILOL 3.125 MG PO TABS
3.1250 mg | ORAL_TABLET | Freq: Two times a day (BID) | ORAL | Status: DC
  Administered 2019-10-08 – 2019-10-09 (×2): 3.125 mg via ORAL
  Filled 2019-10-08 (×2): qty 1

## 2019-10-08 MED ORDER — ONDANSETRON HCL 4 MG/2ML IJ SOLN
INTRAMUSCULAR | Status: AC
  Filled 2019-10-08: qty 2

## 2019-10-08 MED ORDER — TECHNETIUM TC 99M SULFUR COLLOID FILTERED
1.0000 | Freq: Once | INTRAVENOUS | Status: AC | PRN
  Administered 2019-10-08: 1 via INTRADERMAL

## 2019-10-08 MED ORDER — KETOROLAC TROMETHAMINE 15 MG/ML IJ SOLN
INTRAMUSCULAR | Status: AC
  Administered 2019-10-08: 15 mg via INTRAVENOUS
  Filled 2019-10-08: qty 1

## 2019-10-08 MED ORDER — ACETAMINOPHEN 10 MG/ML IV SOLN: 1000.0000 mg | Freq: Once | INTRAVENOUS | Status: DC | PRN

## 2019-10-08 MED ORDER — LACTATED RINGERS IV SOLN: INTRAVENOUS | Status: DC | PRN

## 2019-10-08 MED ORDER — FENTANYL CITRATE (PF) 100 MCG/2ML IJ SOLN
INTRAMUSCULAR | Status: AC
  Administered 2019-10-08: 50 ug via INTRAVENOUS
  Filled 2019-10-08: qty 2

## 2019-10-08 MED ORDER — FENTANYL CITRATE (PF) 100 MCG/2ML IJ SOLN
50.0000 ug | Freq: Once | INTRAMUSCULAR | Status: AC
  Administered 2019-10-08: 50 ug via INTRAVENOUS

## 2019-10-08 MED ORDER — MEPERIDINE HCL 25 MG/ML IJ SOLN: 6.2500 mg | INTRAMUSCULAR | Status: DC | PRN

## 2019-10-08 MED ORDER — FENTANYL CITRATE (PF) 100 MCG/2ML IJ SOLN
INTRAMUSCULAR | Status: AC
  Filled 2019-10-08: qty 2

## 2019-10-08 MED ORDER — HYDROMORPHONE HCL 1 MG/ML IJ SOLN
0.2500 mg | INTRAMUSCULAR | Status: DC | PRN
  Administered 2019-10-08 (×2): 0.25 mg via INTRAVENOUS

## 2019-10-08 MED ORDER — ORAL CARE MOUTH RINSE: 15.0000 mL | Freq: Once | OROMUCOSAL | Status: AC

## 2019-10-08 MED ORDER — SIMETHICONE 80 MG PO CHEW: 40.0000 mg | CHEWABLE_TABLET | Freq: Four times a day (QID) | ORAL | Status: DC | PRN

## 2019-10-08 MED ORDER — MIDAZOLAM HCL 2 MG/2ML IJ SOLN
INTRAMUSCULAR | Status: AC
  Filled 2019-10-08: qty 2

## 2019-10-08 MED ORDER — ROCURONIUM BROMIDE 10 MG/ML (PF) SYRINGE
PREFILLED_SYRINGE | INTRAVENOUS | Status: DC | PRN
  Administered 2019-10-08: 70 mg via INTRAVENOUS
  Administered 2019-10-08: 10 mg via INTRAVENOUS

## 2019-10-08 MED ORDER — PROMETHAZINE HCL 25 MG/ML IJ SOLN: 6.2500 mg | INTRAMUSCULAR | Status: DC | PRN

## 2019-10-08 MED ORDER — FENTANYL CITRATE (PF) 100 MCG/2ML IJ SOLN: 50.0000 ug | Freq: Once | INTRAMUSCULAR | Status: AC

## 2019-10-08 MED ORDER — ONDANSETRON HCL 4 MG/2ML IJ SOLN: 4.0000 mg | Freq: Four times a day (QID) | INTRAMUSCULAR | Status: DC | PRN

## 2019-10-08 MED ORDER — ACETAMINOPHEN 500 MG PO TABS
1000.0000 mg | ORAL_TABLET | Freq: Four times a day (QID) | ORAL | Status: DC
  Administered 2019-10-08 – 2019-10-09 (×4): 1000 mg via ORAL
  Filled 2019-10-08 (×4): qty 2

## 2019-10-08 MED ORDER — STERILE WATER FOR IRRIGATION IR SOLN
Status: DC | PRN
  Administered 2019-10-08: 1000 mL

## 2019-10-08 MED ORDER — METHOCARBAMOL 500 MG PO TABS
500.0000 mg | ORAL_TABLET | Freq: Three times a day (TID) | ORAL | Status: DC
  Administered 2019-10-08 – 2019-10-09 (×2): 500 mg via ORAL
  Filled 2019-10-08 (×2): qty 1

## 2019-10-08 MED ORDER — ALBUMIN HUMAN 5 % IV SOLN: INTRAVENOUS | Status: DC | PRN

## 2019-10-08 MED ORDER — LACTATED RINGERS IV SOLN: INTRAVENOUS | Status: DC

## 2019-10-08 MED ORDER — SACUBITRIL-VALSARTAN 97-103 MG PO TABS
1.0000 | ORAL_TABLET | Freq: Two times a day (BID) | ORAL | Status: DC
  Administered 2019-10-08 – 2019-10-09 (×2): 1 via ORAL
  Filled 2019-10-08 (×3): qty 1

## 2019-10-08 MED ORDER — LIDOCAINE 2% (20 MG/ML) 5 ML SYRINGE
INTRAMUSCULAR | Status: DC | PRN
  Administered 2019-10-08: 50 mg via INTRAVENOUS

## 2019-10-08 MED ORDER — GABAPENTIN 100 MG PO CAPS: 100.0000 mg | ORAL_CAPSULE | ORAL | Status: AC

## 2019-10-08 MED ORDER — ONDANSETRON 4 MG PO TBDP: 4.0000 mg | ORAL_TABLET | Freq: Four times a day (QID) | ORAL | Status: DC | PRN

## 2019-10-08 MED ORDER — HEMOSTATIC AGENTS (NO CHARGE) OPTIME
TOPICAL | Status: DC | PRN
  Administered 2019-10-08 (×3): 1 via TOPICAL

## 2019-10-08 MED ORDER — ACETAMINOPHEN 500 MG PO TABS
ORAL_TABLET | ORAL | Status: AC
  Administered 2019-10-08: 1000 mg via ORAL
  Filled 2019-10-08: qty 2

## 2019-10-08 MED ORDER — PROPOFOL 10 MG/ML IV BOLUS
INTRAVENOUS | Status: DC | PRN
  Administered 2019-10-08: 160 mg via INTRAVENOUS

## 2019-10-08 MED ORDER — TRAMADOL HCL 50 MG PO TABS: 50.0000 mg | ORAL_TABLET | Freq: Four times a day (QID) | ORAL | Status: DC | PRN

## 2019-10-08 MED ORDER — GABAPENTIN 100 MG PO CAPS
ORAL_CAPSULE | ORAL | Status: AC
  Administered 2019-10-08: 100 mg via ORAL
  Filled 2019-10-08: qty 1

## 2019-10-08 MED ORDER — ENSURE PRE-SURGERY PO LIQD: 296.0000 mL | Freq: Once | ORAL | Status: DC

## 2019-10-08 MED ORDER — CEFAZOLIN SODIUM-DEXTROSE 2-4 GM/100ML-% IV SOLN
2.0000 g | INTRAVENOUS | Status: AC
  Administered 2019-10-08: 2 g via INTRAVENOUS

## 2019-10-08 MED ORDER — LIDOCAINE 2% (20 MG/ML) 5 ML SYRINGE
INTRAMUSCULAR | Status: AC
  Filled 2019-10-08: qty 5

## 2019-10-08 MED ORDER — FENTANYL CITRATE (PF) 250 MCG/5ML IJ SOLN
INTRAMUSCULAR | Status: AC
  Filled 2019-10-08: qty 5

## 2019-10-08 MED ORDER — SPIRONOLACTONE 25 MG PO TABS
25.0000 mg | ORAL_TABLET | Freq: Every day | ORAL | Status: DC
  Administered 2019-10-09: 25 mg via ORAL
  Filled 2019-10-08: qty 1

## 2019-10-08 MED ORDER — DEXAMETHASONE SODIUM PHOSPHATE 10 MG/ML IJ SOLN
INTRAMUSCULAR | Status: AC
  Filled 2019-10-08: qty 1

## 2019-10-08 MED ORDER — FENTANYL CITRATE (PF) 100 MCG/2ML IJ SOLN
25.0000 ug | INTRAMUSCULAR | Status: DC | PRN
  Administered 2019-10-08 (×2): 25 ug via INTRAVENOUS
  Administered 2019-10-08 (×2): 50 ug via INTRAVENOUS

## 2019-10-08 MED ORDER — DAPAGLIFLOZIN PROPANEDIOL 10 MG PO TABS
10.0000 mg | ORAL_TABLET | Freq: Every day | ORAL | Status: DC
  Administered 2019-10-09: 10 mg via ORAL
  Filled 2019-10-08: qty 1

## 2019-10-08 MED ORDER — DEXAMETHASONE SODIUM PHOSPHATE 10 MG/ML IJ SOLN
INTRAMUSCULAR | Status: DC | PRN
  Administered 2019-10-08: 5 mg via INTRAVENOUS

## 2019-10-08 MED ORDER — FUROSEMIDE 20 MG PO TABS: 20.0000 mg | ORAL_TABLET | Freq: Every day | ORAL | Status: DC | PRN

## 2019-10-08 MED ORDER — 0.9 % SODIUM CHLORIDE (POUR BTL) OPTIME
TOPICAL | Status: DC | PRN
  Administered 2019-10-08: 1000 mL

## 2019-10-08 MED ORDER — MORPHINE SULFATE (PF) 2 MG/ML IV SOLN
1.0000 mg | INTRAVENOUS | Status: DC | PRN
  Administered 2019-10-08: 1 mg via INTRAVENOUS
  Filled 2019-10-08: qty 1

## 2019-10-08 MED ORDER — BUPIVACAINE-EPINEPHRINE (PF) 0.25% -1:200000 IJ SOLN
INTRAMUSCULAR | Status: DC | PRN
  Administered 2019-10-08 (×2): 30 mL

## 2019-10-08 MED ORDER — CHLORHEXIDINE GLUCONATE 0.12 % MT SOLN
15.0000 mL | Freq: Once | OROMUCOSAL | Status: AC
  Administered 2019-10-08: 15 mL via OROMUCOSAL
  Filled 2019-10-08: qty 15

## 2019-10-08 MED ORDER — CEFAZOLIN SODIUM-DEXTROSE 2-4 GM/100ML-% IV SOLN
INTRAVENOUS | Status: AC
  Filled 2019-10-08: qty 100

## 2019-10-08 MED ORDER — EPHEDRINE SULFATE 50 MG/ML IJ SOLN
INTRAMUSCULAR | Status: DC | PRN
  Administered 2019-10-08: 5 mg via INTRAVENOUS

## 2019-10-08 MED ORDER — ACETAMINOPHEN 325 MG PO TABS: 325.0000 mg | ORAL_TABLET | Freq: Once | ORAL | Status: DC | PRN

## 2019-10-08 MED ORDER — FENTANYL CITRATE (PF) 250 MCG/5ML IJ SOLN
INTRAMUSCULAR | Status: DC | PRN
  Administered 2019-10-08: 150 ug via INTRAVENOUS
  Administered 2019-10-08 (×2): 50 ug via INTRAVENOUS

## 2019-10-08 MED ORDER — HYDROMORPHONE HCL 1 MG/ML IJ SOLN
INTRAMUSCULAR | Status: AC
  Filled 2019-10-08: qty 1

## 2019-10-08 MED ORDER — KETOROLAC TROMETHAMINE 15 MG/ML IJ SOLN: 15.0000 mg | INTRAMUSCULAR | Status: AC

## 2019-10-08 MED ORDER — MIDAZOLAM HCL 2 MG/2ML IJ SOLN: 2.0000 mg | Freq: Once | INTRAMUSCULAR | Status: AC

## 2019-10-08 MED ORDER — MIDAZOLAM HCL 5 MG/5ML IJ SOLN
INTRAMUSCULAR | Status: DC | PRN
  Administered 2019-10-08: 2 mg via INTRAVENOUS

## 2019-10-08 MED ORDER — ACETAMINOPHEN 500 MG PO TABS: 1000.0000 mg | ORAL_TABLET | ORAL | Status: AC

## 2019-10-08 MED ORDER — ONDANSETRON HCL 4 MG/2ML IJ SOLN
INTRAMUSCULAR | Status: DC | PRN
  Administered 2019-10-08: 4 mg via INTRAVENOUS

## 2019-10-08 MED ORDER — OXYCODONE HCL 5 MG PO TABS
5.0000 mg | ORAL_TABLET | ORAL | Status: DC | PRN
  Administered 2019-10-08 – 2019-10-09 (×2): 10 mg via ORAL
  Administered 2019-10-09 (×2): 5 mg via ORAL
  Filled 2019-10-08 (×2): qty 2
  Filled 2019-10-08: qty 1
  Filled 2019-10-08: qty 2

## 2019-10-08 MED ORDER — MIDAZOLAM HCL 2 MG/2ML IJ SOLN
INTRAMUSCULAR | Status: AC
  Administered 2019-10-08: 2 mg via INTRAVENOUS
  Filled 2019-10-08: qty 2

## 2019-10-08 SURGICAL SUPPLY — 54 items
ADH SKNCLS APL OCTYL .7 VIOL (GAUZE/BANDAGES/DRESSINGS) ×12
APPLIER CLIP 9.375 MED OPEN (MISCELLANEOUS) ×6
BINDER BREAST XLRG (GAUZE/BANDAGES/DRESSINGS) ×3 IMPLANT
BIOPATCH RED 1 DISK 7.0 (GAUZE/BANDAGES/DRESSINGS) ×6 IMPLANT
CANISTER SUCT 3000ML PPV (MISCELLANEOUS) ×3 IMPLANT
CHLORAPREP W/TINT 26 (MISCELLANEOUS) ×6 IMPLANT
CLIP APPLIE 9.375 MED OPEN (MISCELLANEOUS) ×4 IMPLANT
CNTNR URN SCR LID CUP LEK RST (MISCELLANEOUS) ×2 IMPLANT
CONT SPEC 4OZ STRL OR WHT (MISCELLANEOUS) ×3
COVER PROBE W GEL 5X96 (DRAPES) ×3 IMPLANT
COVER SURGICAL LIGHT HANDLE (MISCELLANEOUS) ×3 IMPLANT
COVER WAND RF STERILE (DRAPES) ×3 IMPLANT
DERMABOND ADVANCED (GAUZE/BANDAGES/DRESSINGS) ×6
DERMABOND ADVANCED .7 DNX12 (GAUZE/BANDAGES/DRESSINGS) ×12 IMPLANT
DRAIN CHANNEL 19F RND (DRAIN) ×9 IMPLANT
DRAPE HALF SHEET 40X57 (DRAPES) ×3 IMPLANT
DRAPE LAPAROSCOPIC ABDOMINAL (DRAPES) ×3 IMPLANT
DRSG PAD ABDOMINAL 8X10 ST (GAUZE/BANDAGES/DRESSINGS) ×3 IMPLANT
DRSG TEGADERM 2-3/8X2-3/4 SM (GAUZE/BANDAGES/DRESSINGS) ×3 IMPLANT
DRSG TEGADERM 4X4.75 (GAUZE/BANDAGES/DRESSINGS) ×3 IMPLANT
ELECT CAUTERY BLADE 6.4 (BLADE) ×3 IMPLANT
ELECT REM PT RETURN 9FT ADLT (ELECTROSURGICAL) ×3
ELECTRODE REM PT RTRN 9FT ADLT (ELECTROSURGICAL) ×2 IMPLANT
EVACUATOR SILICONE 100CC (DRAIN) ×6 IMPLANT
GAUZE SPONGE 4X4 12PLY STRL (GAUZE/BANDAGES/DRESSINGS) ×3 IMPLANT
GLOVE BIO SURGEON STRL SZ7 (GLOVE) ×3 IMPLANT
GLOVE BIOGEL PI IND STRL 7.5 (GLOVE) ×2 IMPLANT
GLOVE BIOGEL PI INDICATOR 7.5 (GLOVE) ×1
GOWN STRL REUS W/ TWL LRG LVL3 (GOWN DISPOSABLE) ×6 IMPLANT
GOWN STRL REUS W/TWL LRG LVL3 (GOWN DISPOSABLE) ×9
HEMOSTAT ARISTA ABSORB 3G PWDR (HEMOSTASIS) ×9 IMPLANT
KIT BASIN OR (CUSTOM PROCEDURE TRAY) ×3 IMPLANT
KIT TURNOVER KIT B (KITS) ×3 IMPLANT
MARKER SKIN DUAL TIP RULER LAB (MISCELLANEOUS) ×3 IMPLANT
NS IRRIG 1000ML POUR BTL (IV SOLUTION) ×3 IMPLANT
PACK GENERAL/GYN (CUSTOM PROCEDURE TRAY) ×3 IMPLANT
PAD ARMBOARD 7.5X6 YLW CONV (MISCELLANEOUS) ×3 IMPLANT
PENCIL SMOKE EVACUATOR (MISCELLANEOUS) ×3 IMPLANT
PIN SAFETY STERILE (MISCELLANEOUS) ×3 IMPLANT
SPECIMEN JAR X LARGE (MISCELLANEOUS) ×6 IMPLANT
SPONGE LAP 18X18 RF (DISPOSABLE) ×9 IMPLANT
STRIP CLOSURE SKIN 1/2X4 (GAUZE/BANDAGES/DRESSINGS) ×12 IMPLANT
SUT ETHILON 2 0 FS 18 (SUTURE) ×6 IMPLANT
SUT ETHILON 3 0 FSL (SUTURE) ×3 IMPLANT
SUT MNCRL AB 4-0 PS2 18 (SUTURE) ×12 IMPLANT
SUT MON AB 4-0 PC3 18 (SUTURE) ×3 IMPLANT
SUT SILK 2 0 SH (SUTURE) ×6 IMPLANT
SUT VIC AB 3-0 54X BRD REEL (SUTURE) ×4 IMPLANT
SUT VIC AB 3-0 BRD 54 (SUTURE) ×6
SUT VIC AB 3-0 SH 18 (SUTURE) ×6 IMPLANT
SUT VIC AB 3-0 SH 8-18 (SUTURE) ×3 IMPLANT
TOWEL GREEN STERILE (TOWEL DISPOSABLE) ×3 IMPLANT
TOWEL GREEN STERILE FF (TOWEL DISPOSABLE) ×3 IMPLANT
YANKAUER SUCT BULB TIP NO VENT (SUCTIONS) ×3 IMPLANT

## 2019-10-08 NOTE — Anesthesia Procedure Notes (Signed)
Anesthesia Regional Block: Pectoralis block   Pre-Anesthetic Checklist: ,, timeout performed, Correct Patient, Correct Site, Correct Laterality, Correct Procedure, Correct Position, site marked, Risks and benefits discussed,  Surgical consent,  Pre-op evaluation,  At surgeon's request and post-op pain management  Laterality: Left  Prep: chloraprep       Needles:  Injection technique: Single-shot  Needle Type: Echogenic Stimulator Needle     Needle Length: 9cm  Needle Gauge: 21     Additional Needles:   Procedures:,,,, ultrasound used (permanent image in chart),,,,  Narrative:  Start time: 10/08/2019 11:55 AM End time: 10/08/2019 12:00 PM Injection made incrementally with aspirations every 5 mL.  Performed by: Personally  Anesthesiologist: Effie Berkshire, MD  Additional Notes: Patient tolerated the procedure well. Local anesthetic introduced in an incremental fashion under minimal resistance after negative aspirations. No paresthesias were elicited. After completion of the procedure, no acute issues were identified and patient continued to be monitored by RN.

## 2019-10-08 NOTE — Transfer of Care (Addendum)
Immediate Anesthesia Transfer of Care Note  Patient: Melissa [name]  Procedure(s) Performed: BILATERAL MASTECTOMY WITH LEFT AXILLARY SENTINEL LYMPH NODE BIOPSY (Bilateral Breast) LEFT RADIOACTIVE SEED GUIDED AXILLARY SENTINEL LYMPH NODE EXCISION (Left Breast)  Patient Location: PACU  Anesthesia Type:General  Level of Consciousness: awake, drowsy and patient cooperative  Airway & Oxygen Therapy: Patient Spontanous Breathing  Post-op Assessment: Report given to RN and Post -op Vital signs reviewed and stable  Post vital signs: Reviewed and stable  Last Vitals:  Vitals Value Taken Time  BP 113/61 10/08/19 1559  Temp    Pulse 79 10/08/19 1611  Resp 10 10/08/19 1611  SpO2 100 % 10/08/19 1611  Vitals shown include unvalidated device data.  Last Pain:  Vitals:   10/08/19 1250  TempSrc:   PainSc: 0-No pain         Complications: No complications documented.

## 2019-10-08 NOTE — Op Note (Signed)
Preoperative diagnosis: Left breast cancer s/p primary chemotherapy Postoperative diagnosis: saa Procedure: 1. Right risk reducing mastectomy 2. Left mastectomy 3. Left targeted deep axillary node dissection Surgeon: Dr Serita Grammes Asst: Judyann Munson, RNFA EBL: 500cc Drains 2 17 Fr Blake drains to right, 19 Fr Blake drain to left Specimens: 1. Left mastectomy short superior, long lateral 2. Right mastectomy short superior, long lateral 3. Right axillary nodes, containing seed Complications none Sponge and needle count correct dispo recovery stable  Indications: 28 yof I saw previously when she was 5 months pregnant. she had noted for about 4 months of left breast swelling and hardness. she was treated with abx and then eventually was sent for mm and [place]. her density is C. on mm there is diffuse skin thickening and suspicious calcifications spanning 8x5 cm. US shows at least 3 abnormal nodes present with possible fourth. biopsy of the breast is idc grade 2 that is her 2 negative, er pos, pr pos and Ki 50%.since then she has been undergoing chemo which she has tolerated well. she had her daughter June 9 and she isdoing well. she is due to finish chemotherapy next Friday. she notes decreased size left breast. We discussed left mastectomy, right mastectomy with tad.   Procedure: After informed consent obtained patient was taken to OR.  She had bilateral pectoral blocks.  She was given abx.  SCDs were in place.  She was placed under general anesthesia without complication.  A surgical timeout was performed. Incisions had been marked bilaterally by plastic surgery for later reconstruction.   I did the left mastectomy first. I followed the incision lines in reduction pattern and then created flaps to the clavicle, parasternal area, inframammary fold and the latissimus laterally.  I then removed the breast and the fascia from the pectoralis muscle.  I marked this as above and passed off the  table.  I then obtained hemostasis.  I placed a single 19 Fr Blake drain. I closed the dermis with 3-0 vicryl and then the skin with 4-0 monocryl. I placed glue and steristrips.  I then did the right mastectomy.  This was difficult due to thick skin and blood loss. I then created flaps like the other side. I removed the breast and pectoralis fascia.  This was marked and passed off the table.  I then identified the seed in the axillary nodes and removed this. This did not contain the clip as expected. I did remove all the lower lying axillary nodes. There were no palpable nodes and I did a low axillary dissection. I then obtained hemostasis. I placed 2 19 Blake drains.  I then removed additional superior tissue that was in excess.  I closed the dermis with 3-0 vicryl and then the skin with 4-0 monocryl. I placed glue and steristrips. She was extubated in or and transferred to recovery stable.

## 2019-10-08 NOTE — Anesthesia Procedure Notes (Signed)
Procedure Name: Intubation Date/Time: 10/08/2019 1:37 PM Performed by: Oletta Lamas, CRNA Pre-anesthesia Checklist: Patient identified, Emergency Drugs available, Suction available and Patient being monitored Patient Re-evaluated:Patient Re-evaluated prior to induction Oxygen Delivery Method: Circle System Utilized Preoxygenation: Pre-oxygenation with 100% oxygen Induction Type: IV induction Ventilation: Mask ventilation without difficulty Laryngoscope Size: Miller and 2 Grade View: Grade I Tube type: Oral Tube size: 7.0 mm Number of attempts: 1 Airway Equipment and Method: Stylet and Oral airway Placement Confirmation: ETT inserted through vocal cords under direct vision,  positive ETCO2 and breath sounds checked- equal and bilateral Secured at: 22 cm Tube secured with: Tape Dental Injury: Teeth and Oropharynx as per pre-operative assessment

## 2019-10-08 NOTE — H&P (Signed)
Melissa [name] is an 29 y.o. female.   Chief Complaint: breast cancer HPI: 9 yof I saw previously when she was 5 months pregnant. she had noted for about 4 months of left breast swelling and hardness. she was treated with abx and then eventually was sent for mm and [place]. her density is C. on mm there is diffuse skin thickening and suspicious calcifications spanning 8x5 cm. US shows at least 3 abnormal nodes present with possible fourth. biopsy of the breast is idc grade 2 that is her 2 negative, er pos, pr pos and Ki 50%.since then she has been undergoing chemo which she has tolerated well. she had her daughter June 9 and she isdoing well. she is due to finish chemotherapy next Friday. she notes decreased size left breast. she is here to discuss surgery   Past Medical History:  Diagnosis Date  . Abnormal Pap smear 07/13/11   ASCUS +HPV  . Anemia   . Cancer [facility])    breast cancer  . CHF (congestive heart failure) ([place])   . Family history of breast cancer   . Family history of colon cancer   . Family history of throat cancer   . Fracture of distal phalanx of finger 10/05/2019   Right thumb and middle finger  . Herpes simplex without mention of complication    dx age 20    Past Surgical History:  Procedure Laterality Date  . BREAST BIOPSY Left 03/18/2019   Procedure: LEFT BREAST SKIN PUNCH BIOPSY;  Surgeon: Rolm Bookbinder, MD;  Location: [place];  Service: General;  Laterality: Left;  . COLPOSCOPY     CIN I ECC -  . NO PAST SURGERIES    . PORTACATH PLACEMENT N/A 03/18/2019   Procedure: INSERTION PORT-A-CATH WITH ULTRASOUND GUIDANCE;  Surgeon: Rolm Bookbinder, MD;  Location: [place];  Service: General;  Laterality: N/A;    Family History  Problem Relation Age of Onset  . Breast cancer Paternal Grandmother        dx. 2s or younger  . Stroke Father 47  . Hypertension Father   . Cancer Maternal Aunt        unknown type, dx. late 66s  . Colon cancer Paternal Uncle        dx 47s   . Throat cancer Maternal Grandmother        dx. 21s  . Cancer Paternal Uncle        unknown type, dx. 24s  . Breast cancer Cousin        dx. 36s, paternal 1st cousin   Social History:  reports that she quit smoking about 7 months ago. Her smoking use included cigarettes. She has never used smokeless tobacco. She reports previous alcohol use. She reports current drug use. Frequency: 2.00 times per week. Drug: Marijuana.  Allergies: No Known Allergies  Medications Prior to Admission  Medication Sig Dispense Refill  . carvedilol (COREG) 3.125 MG tablet Take 1 tablet (3.125 mg total) by mouth 2 (two) times daily. 60 tablet 11  . dapagliflozin propanediol (FARXIGA) 10 MG TABS tablet Take 1 tablet (10 mg total) by mouth daily before breakfast. 30 tablet 11  . furosemide (LASIX) 20 MG tablet Take 1 tablet (20 mg total) by mouth daily as needed for fluid or edema. 90 tablet 2  . sacubitril-valsartan (ENTRESTO) 97-103 MG Take 1 tablet by mouth 2 (two) times daily. 60 tablet 11  . spironolactone (ALDACTONE) 25 MG tablet Take 1 tablet (25 mg total) by mouth daily.  90 tablet 3  . lidocaine-prilocaine (EMLA) cream APPLY 1 APPLICATION TOPICALLY ONCE FOR 1 DOSE. (Patient not taking: Reported on 09/28/2019)      No results found for this or any previous visit (from the past 48 hour(s)). [place] LT RADIOACTIVE SEED LOC  Result Date: 10/07/2019 CLINICAL DATA:  Pre bilateral mastectomy localization of a left axillary lymph node biopsy on 02/26/2019, demonstrating a metastatic node. The patient has recently completed neoadjuvant chemotherapy. EXAM: ULTRASOUND GUIDED RADIOACTIVE SEED LOCALIZATION OF THE LEFT AXILLA COMPARISON:  Previous exam(s). FINDINGS: Patient presents for radioactive seed localization prior to bilateral mastectomy and left axillary lymph node resection. I met with the patient and we discussed the procedure of seed localization including benefits and alternatives. We discussed the high likelihood  of a successful procedure. We discussed the risks of the procedure including infection, bleeding, tissue injury and further surgery. We discussed the low dose of radioactivity involved in the procedure. Informed, written consent was given. The usual time-out protocol was performed immediately prior to the procedure. Preliminary ultrasound evaluation of the left axilla demonstrated multiple normal appearing left axillary lymph nodes. There was 1 node with a small linear, brightly echogenic focus at the same location as the [facility] clip seen in a similar appearing lymph node at time of biopsy on 02/26/2019. Using ultrasound guidance, sterile technique, 1% lidocaine and an I-125 radioactive seed, the lymph nodes seen with the small linear right echogenic focus in the left axilla was localized using an inferolateral approach. The follow-up mammogram images confirm the seed in the expected location and were marked for Dr. [name]. Follow-up survey of the patient confirms presence of the radioactive seed. Order number of I-125 seed:  [id number]. Total activity:  0.233 mCi reference Date: 10/01/2019 A post procedure 3D spot compression mammogram the left axilla was obtained in the oblique projection. The radioactive seed is located in a lymph node 3 cm superior to the [facility] clip in the previously biopsied lymph node. The patient tolerated the procedure well and was [facility]. She was given instructions regarding seed removal. IMPRESSION: Radioactive seed localization left axilla. The radioactive seed is located in a lymph node 3 cm superior to the previously biopsied lymph node containing the [facility] biopsy marker clip. Therefore, at the time of resection of the lymph node containing the seed, resection of the nodes more inferiorly in the left axilla is recommended. This has been discussed with Dr. [name]. No apparent complications. Electronically Signed   By: Claudie Revering M.D.   On:  10/07/2019 15:38    Review of Systems Negative  Blood pressure (!) 107/56, pulse 78, temperature 98.1 F (36.7 C), temperature source Oral, resp. rate 18, height _0  (1.575 m), weight 83.9 kg, SpO2 100 %. Physical Exam  General Mental Status-Alert. Orientation-Oriented X3. Breast Nipples-No Discharge. Note: palpable left breast mass smaller, some peau d orange changes, no redness, breast smaller than before Lymphatic Head & Neck General Head & Neck Lymphatics: Bilateral - Description - Normal. Axillary General Axillary Region: Bilateral - Description - Normal. Note: no Davey adenopathy  Assessment/Plan BREAST CANCER METASTASIZED TO AXILLARY LYMPH NODE, RIGHT (C50.911) Story: Right RRM, left mastectomy, Left TAD I discussed options and I think only option on left is mastectomy . she has seen plastics and elected to delay recon due to disease and need for radiotherapy. I agree with that decision. discussed mastectomy with drains and appearance afterwards. discussed overnight stay. also discussed TAD vs ALND based on imaging. I  dont know that she would benefit from alnd if nodes normal and have been made negative which is possible. we discussed risk of lymphedema in this young woman with two small children as well. will decide once we have those results for sure and let her know. she also desires right rrm and I think this is reasonable given her age as well. we discussed that does not completely prevent breast cancer long term   Rolm Bookbinder, MD 10/08/2019, 1:00 PM

## 2019-10-08 NOTE — Anesthesia Procedure Notes (Signed)
Anesthesia Regional Block: Pectoralis block   Pre-Anesthetic Checklist: ,, timeout performed, Correct Patient, Correct Site, Correct Laterality, Correct Procedure, Correct Position, site marked, Risks and benefits discussed,  Surgical consent,  Pre-op evaluation,  At surgeon's request and post-op pain management  Laterality: Right  Prep: chloraprep       Needles:  Injection technique: Single-shot  Needle Type: Echogenic Stimulator Needle     Needle Length: 9cm  Needle Gauge: 21     Additional Needles:   Procedures:,,,, ultrasound used (permanent image in chart),,,,  Narrative:  Start time: 10/08/2019 12:00 PM End time: 10/08/2019 12:05 PM Injection made incrementally with aspirations every 5 mL.  Performed by: Personally  Anesthesiologist: Effie Berkshire, MD  Additional Notes: Patient tolerated the procedure well. Local anesthetic introduced in an incremental fashion under minimal resistance after negative aspirations. No paresthesias were elicited. After completion of the procedure, no acute issues were identified and patient continued to be monitored by RN.

## 2019-10-09 ENCOUNTER — Encounter (HOSPITAL_COMMUNITY): Payer: Self-pay | Admitting: General Surgery

## 2019-10-09 ENCOUNTER — Telehealth: Payer: Self-pay | Admitting: Hematology and Oncology

## 2019-10-09 DIAGNOSIS — O9229 Other disorders of breast associated with pregnancy and the puerperium: Secondary | ICD-10-CM | POA: Diagnosis not present

## 2019-10-09 LAB — POCT I-STAT, CHEM 8
BUN: 19 mg/dL (ref 6–20)
Calcium, Ion: 1.2 mmol/L (ref 1.15–1.40)
Chloride: 99 mmol/L (ref 98–111)
Creatinine, Ser: 0.9 mg/dL (ref 0.44–1.00)
Glucose, Bld: 94 mg/dL (ref 70–99)
HCT: 29 % — ABNORMAL LOW (ref 36.0–46.0)
Hemoglobin: 9.9 g/dL — ABNORMAL LOW (ref 12.0–15.0)
Potassium: 3.9 mmol/L (ref 3.5–5.1)
Sodium: 138 mmol/L (ref 135–145)
TCO2: 23 mmol/L (ref 22–32)

## 2019-10-09 MED ORDER — GABAPENTIN 100 MG PO CAPS
100.0000 mg | ORAL_CAPSULE | Freq: Every day | ORAL | 0 refills | Status: DC
Start: 2019-10-09 — End: 2020-12-05

## 2019-10-09 MED ORDER — OXYCODONE HCL 5 MG PO TABS: 5.0000 mg | ORAL_TABLET | ORAL | 0 refills | Status: DC | PRN

## 2019-10-09 MED ORDER — METHOCARBAMOL 500 MG PO TABS: 500.0000 mg | ORAL_TABLET | Freq: Three times a day (TID) | ORAL | 1 refills | Status: DC

## 2019-10-09 NOTE — Progress Notes (Signed)
Drain care and education completed with patient. Patient provided drain hand out and given supplies to empty and measure output at home. No questions at this time.

## 2019-10-09 NOTE — Discharge Instructions (Signed)
Melissa [name], Melissa [name]  MASTECTOMY: POST OP INSTRUCTIONS Take 400 mg of ibuprofen every 8 hours or 650 mg tylenol every 6 hours for next 72 hours then as needed. Use ice several times daily also. Always review your discharge instruction sheet given to you by the facility where your [name] was performed. IF YOU HAVE DISABILITY OR FAMILY LEAVE FORMS, YOU MUST BRING THEM TO THE OFFICE FOR PROCESSING.   DO NOT GIVE THEM TO YOUR DOCTOR. A prescription for pain medication may be given to you upon discharge.  Take your pain medication as prescribed, if needed.  If narcotic pain medicine is not needed, then you may take acetaminophen (Tylenol), naprosyn (Alleve) or ibuprofen (Advil) as needed. 1. Take your usually prescribed medications unless otherwise directed. 2. If you need a refill on your pain medication, please contact your pharmacy.  They will contact our office to request authorization.  Prescriptions will not be filled after 5pm or on week-ends. 3. You should follow a light diet the first few days after arrival home, such as soup and crackers, etc.  Resume your normal diet the day after [name]. 4. Most patients will experience some swelling and bruising on the chest and underarm.  Ice packs will help.  Swelling and bruising can take several days to resolve. Wear the binder day and night until you return to the office.  5. It is common to experience some constipation if taking pain medication after [name].  Increasing fluid intake and taking a stool softener (such as Colace) will usually help or prevent this problem from occurring.  A mild laxative (Milk of Magnesia or Miralax) should be taken according to package instructions if there are no bowel movements after 48 hours. 6. You may shower 48 hours after [name]. You may have steri-strips (small skin tapes) in place directly over the incision.  These strips should be left on the skin for 7-10 days. If you have glue it will  come off in next couple week.  Any sutures will be removed at an office visit 7. DRAINS:  If you have drains in place, it is important to keep a list of the amount of drainage produced each day in your drains.  Before leaving the hospital, you should be instructed on drain care.  Call our office if you have any questions about your drains. I will remove your drains when they put out less than 30 cc or ml for 2 consecutive days. 8. ACTIVITIES:  You may resume regular (light) daily activities beginning the next day--such as daily self-care, walking, climbing stairs--gradually increasing activities as tolerated.  You may have sexual intercourse when it is comfortable.  Refrain from any heavy lifting or straining until approved by your doctor. a. You may drive when you are no longer taking prescription pain medication, you can comfortably wear a seatbelt, and you can safely maneuver your car and apply brakes. b. RETURN TO WORK:  __________________________________________________________ 9. You should see your doctor in the office for a follow-up appointment approximately 3-5 days after your [name].  Your doctor's nurse will typically make your follow-up appointment when she calls you with your pathology report.  Expect your pathology report 3-4business days after [name]. 10. OTHER INSTRUCTIONS: ______________________________________________________________________________________________ ____________________________________________________________________________________________ WHEN TO CALL YOUR DR Melissa [name]: 1. Fever over 101.0 2. Nausea and/or vomiting 3. Extreme swelling or bruising 4. Continued bleeding from incision. 5. Increased pain, redness, or drainage from the incision. The clinic staff is available to answer your questions during  regular business hours.  Please don't hesitate to call and ask to speak to one of the nurses for clinical concerns.  If you have a medical emergency, go to the  nearest emergency room or call 911.  A surgeon from [name] is always on call at the hospital. [address], [place], [place], [place]  [address] ? [address], [place], [place]   [address] [phone] ? [phone] ? FAX [phone] Web site: [email or web address]

## 2019-10-09 NOTE — Discharge Summary (Signed)
Physician Discharge Summary  Patient ID: Melissa [name] MRN: [id number] DOB/AGE: January 24, 1991 29 y.o.  Admit date: 10/08/2019 Discharge date: 10/09/2019  Admission Diagnoses: Left breast cancer  Discharge Diagnoses:  Active Problems:   Breast cancer, left breast ([place])   Discharged Condition: good  Hospital Course: 13 yof s/p left mastectomy, alnd and right rrm doing well following am will be discharged  Consults: None  Significant Diagnostic Studies: none  Treatments: surgery: right rrm, left mastectomy, left aldn  Discharge Exam: Blood pressure (!) 126/62, pulse 90, temperature 98.2 F (36.8 C), temperature source Oral, resp. rate 17, height 5\' 2"  (1.575 m), weight 83.9 kg, SpO2 100 %. Incision/Wound:no hematomas, all drains serosang as expected  Disposition: Discharge disposition: 01-Home or Self Care        Allergies as of 10/09/2019   No Known Allergies     Medication List    TAKE these medications   carvedilol 3.125 MG tablet Commonly known as: Coreg Take 1 tablet (3.125 mg total) by mouth 2 (two) times daily.   dapagliflozin propanediol 10 MG Tabs tablet Commonly known as: Farxiga Take 1 tablet (10 mg total) by mouth daily before breakfast.   furosemide 20 MG tablet Commonly known as: LASIX Take 1 tablet (20 mg total) by mouth daily as needed for fluid or edema.   gabapentin 100 MG capsule Commonly known as: Neurontin Take 1 capsule (100 mg total) by mouth at bedtime.   lidocaine-prilocaine cream Commonly known as: EMLA APPLY 1 APPLICATION TOPICALLY ONCE FOR 1 DOSE.   methocarbamol 500 MG tablet Commonly known as: ROBAXIN Take 1 tablet (500 mg total) by mouth 3 (three) times daily.   oxyCODONE 5 MG immediate release tablet Commonly known as: Oxy IR/ROXICODONE Take 1 tablet (5 mg total) by mouth every 4 (four) hours as needed for moderate pain.   sacubitril-valsartan 97-103 MG Commonly known as: ENTRESTO Take 1 tablet by mouth 2 (two) times  daily.   spironolactone 25 MG tablet Commonly known as: ALDACTONE Take 1 tablet (25 mg total) by mouth daily.       Follow-up Information    Rolm Bookbinder, MD In 1 week.   Specialty: General Surgery Contact information: [address] [place] [address] [phone]               Signed: Rolm Bookbinder 10/09/2019, 8:37 AM

## 2019-10-09 NOTE — Telephone Encounter (Signed)
Scheduled appointment per 7/29 scheduling message. Patient is aware of appointment date and time.

## 2019-10-09 NOTE — Anesthesia Postprocedure Evaluation (Signed)
Anesthesia Post Note  Patient: Melissa [name]  Procedure(s) Performed: BILATERAL MASTECTOMY WITH LEFT AXILLARY SENTINEL LYMPH NODE BIOPSY (Bilateral Breast) LEFT RADIOACTIVE SEED GUIDED AXILLARY SENTINEL LYMPH NODE EXCISION (Left Breast)     Patient location during evaluation: PACU Anesthesia Type: General Level of consciousness: awake and alert Pain management: pain level controlled Vital Signs Assessment: post-procedure vital signs reviewed and stable Respiratory status: spontaneous breathing, nonlabored ventilation, respiratory function stable and patient connected to nasal cannula oxygen Cardiovascular status: blood pressure returned to baseline and stable Postop Assessment: no apparent nausea or vomiting Anesthetic complications: no   No complications documented.               Effie Berkshire

## 2019-10-12 ENCOUNTER — Encounter: Payer: Self-pay | Admitting: *Deleted

## 2019-10-14 LAB — SURGICAL PATHOLOGY

## 2019-10-15 ENCOUNTER — Encounter: Payer: Self-pay | Admitting: *Deleted

## 2019-10-18 NOTE — Progress Notes (Signed)
Patient Care Team: [name], NP as PCP - General (Nurse Practitioner) [name], RN as Oncology Nurse Navigator Rockwell Germany, RN as Oncology Nurse Navigator Rolm Bookbinder, MD as Surgeon (General Surgery) Nicholas Lose, MD as Medical Oncologist (Hematology and Oncology)  DIAGNOSIS:    ICD-10-CM   1. 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 ER 50%, PR 30%, Ki-67 50%, HER-2 negative ratio 1.03   03/04/2019 Cancer Staging   Staging form: Breast, AJCC 8th Edition - Clinical stage from 03/04/2019: Stage IIIB (cT4, cN1, cM0, G3, ER+, PR+, HER2-) - Signed by GNicholas Lose MD on 03/04/2019    Neo-Adjuvant Chemotherapy   Adriamycin and Cytoxan x 4 given every three weeks without Neulasta support., followed by weekly Taxol x 12 versus Taxotere Cytoxan after her delivery   04/15/2019 Genetic Testing   Negative genetic testing:  No pathogenic variants detected on the [facility] Breast Cancer Guidelines-Based Panel or the Common Hereditary Cancers Panel. The report date is 04/15/2019.  The Breast Cancer Guidelines-Based panel offered by [facility] includes sequencing and rearrangement analysis for the following 11 genes:  ATM, BRCA1, BRCA2, CDH1, CHEK2, NBN, NF1, PALB2, PTEN, STK11 and TP53.  The Common Hereditary Cancers Panel offered by [facility] includes sequencing and/or deletion duplication testing of the  following 48 genes: APC, ATM, AXIN2, BARD1, BMPR1A, BRCA1, BRCA2, BRIP1, CDH1, CDK4, CDKN2A (p14ARF), CDKN2A (p16INK4a), CHEK2, CTNNA1, DICER1, EPCAM (Deletion/duplication testing only), GREM1 (promoter region deletion/duplication testing only), KIT, MEN1, MLH1, MSH2, MSH3, MSH6, MUTYH, NBN, NF1, NHTL1, PALB2, PDGFRA, PMS2, POLD1, POLE, PTEN, RAD50, RAD51C, RAD51D, RNF43, SDHB, SDHC, SDHD, SMAD4, SMARCA4. STK11, TP53, TSC1, TSC2, and VHL.  The following genes were evaluated for sequence changes only: SDHA and HOXB13 c.251G>A variant only.    10/08/2019 Surgery   Bilateral mastectomies ([name]:  Right mastectomy: Multifocal grade 2 IDC 0.8 cm and 0.6 cm with high-grade DCIS, margins negative, 0/4 lymph nodes ER 95%, PR 5%, Ki-67 10%, HER-2 negative;  Left mastectomy: IDC 0.6 cm, LVI, margins negative, 1/4 lymph nodes positive, ER 60%, PR 60%, HER-2 negative, Ki-67 50% RCB class II     CHIEF COMPLIANT: Follow-up s/p bilateral mastectomies   INTERVAL HISTORY: Melissa [name] a 29y.o. with above-mentioned history of left breast cancerwho completedneoadjuvant chemotherapy. She underwent bilateral mastectomies on 10/08/19 with Dr. 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 She presents to the clinic todayto review the pathology report and discuss further treatment.   ALLERGIES:  has No Known Allergies.  MEDICATIONS:  Current Outpatient Medications  Medication Sig Dispense Refill  . carvedilol (COREG) 3.125 MG tablet Take 1 tablet (3.125 mg total) by mouth 2 (two) times daily. 60 tablet 11  . dapagliflozin propanediol (FARXIGA) 10 MG TABS tablet  Take 1 tablet (10 mg total) by mouth daily before breakfast. 30 tablet 11  .  furosemide (LASIX) 20 MG tablet Take 1 tablet (20 mg total) by mouth daily as needed for fluid or edema. 90 tablet 2  . gabapentin (NEURONTIN) 100 MG capsule Take 1 capsule (100 mg total) by mouth at bedtime. 30 capsule 0  . lidocaine-prilocaine (EMLA) cream APPLY 1 APPLICATION TOPICALLY ONCE FOR 1 DOSE. (Patient not taking: Reported on 09/28/2019)    . methocarbamol (ROBAXIN) 500 MG tablet Take 1 tablet (500 mg total) by mouth 3 (three) times daily. 30 tablet 1  . oxyCODONE (OXY IR/ROXICODONE) 5 MG immediate release tablet Take 1 tablet (5 mg total) by mouth every 4 (four) hours as needed for moderate pain. 15 tablet 0  . sacubitril-valsartan (ENTRESTO) 97-103 MG Take 1 tablet by mouth 2 (two) times daily. 60 tablet 11  . spironolactone (ALDACTONE) 25 MG tablet Take 1 tablet (25 mg total) by mouth daily. 90 tablet 3   No current facility-administered medications for this visit.    PHYSICAL EXAMINATION: ECOG PERFORMANCE STATUS: 2 - Symptomatic, <50% confined to bed  There were no vitals filed for this visit. There were no vitals filed for this visit.  LABORATORY DATA:  I have reviewed the data as listed CMP Latest Ref Rng & Units 10/08/2019 09/30/2019 09/18/2019  Glucose 70 - 99 mg/dL 94 100(H) 91  BUN 6 - 20 mg/dL _0 Creatinine 0.44 - 1.00 mg/dL 0.90 0.84 0.79  Sodium 135 - 145 mmol/L 138 138 140  Potassium 3.5 - 5.1 mmol/L 3.9 4.5 4.0  Chloride 98 - 111 mmol/L 99 102 107  CO2 22 - 32 mmol/L - 28 24  Calcium 8.9 - 10.3 mg/dL - 9.3 9.5  Total Protein 6.5 - 8.1 g/dL - 7.3 6.6  Total Bilirubin 0.3 - 1.2 mg/dL - 0.6 0.4  Alkaline Phos 38 - 126 U/L - 59 56  AST 15 - 41 U/L - 22 18  ALT 0 - 44 U/L - 12 13    Lab Results  Component Value Date   WBC 4.9 09/30/2019   HGB 9.9 (L) 10/08/2019   HCT 29.0 (L) 10/08/2019   MCV 98.0 09/30/2019   PLT 283 09/30/2019   NEUTROABS 2.0 09/18/2019    ASSESSMENT & PLAN:  Malignant neoplasm of overlapping sites of left breast in female,  estrogen receptor positive ([place]) 02/26/2019:Pregnant lady with 48-monthhistory of left breast swelling and skin thickening, mammogram showed pleomorphic calcifications 8 cm, axilla lymph node biopsy positive ER 60%, PR 60%, Ki-67 50%, HER-2 negative ratio 1.25, anterior and posterior end of the calcifications biopsied: 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 Neoadjuvant chemotherapy with dose dense Adriamycin and Cytoxan given every 3 weeks without Neulasta support given her pregnancycompleted 05/28/2019 2. Post-Partumweekly Taxol x12starting 07/03/2019 3. Bilateral mastectomy 10/08/2019: Right mastectomy: Multifocal grade 2 IDC 0.8 cm and 0.6 cm with high-grade DCIS, margins negative, 0/4 lymph nodes ER 95%, PR 5%, Ki-67 10%, HER-2 negative; left mastectomy: IDC 0.6 cm, LVI, margins negative, 1/4 lymph nodes positive, ER 60%, PR 60%, HER-2 negative, Ki-67 50% RCB class II 4. Adjuvant radiation therapy 5. Followed by adjuvant antiestrogen therapy with complete estrogen blockade (ovarian suppression with AI) CT C/A/P on 5/13 shows sclerotic bone lesions, bone scan  negative 6.  +/- CDK 4 and 6 inhibitor with antiestrogen therapy ----------------------------------------------------------------------------------------------------------------------------------------------------- Pathology counseling: I discussed the final pathology report of the patient provided  a copy of this report. I discussed the margins as well as lymph node surgeries. We also discussed the final staging along with previously performed ER/PR and HER-2/neu testing.  Treatment plan: Adjuvant radiation followed by adjuvant antiestrogen therapy with complete estrogen blockade (versus nephrectomy) plus or minus CDK 4 and 6  inhibitor  Patient has 2 daughters and she is not seriously contemplating on having anymore children.  She wants to discuss plastic surgery reconstruction options once again. We will have her see plastic surgery and radiation oncology  Port is still in place and port flushes have been ordered. She may remove the port during passive surgery. Return to clinic at the end of radiation to start antiestrogen therapy.   No orders of the defined types were placed in this encounter.  The patient has a good understanding of the overall plan. she agrees with it. she will call with any problems that may develop before the next visit here.  Total time spent: 30 mins including face to face time and time spent for planning, charting and coordination of care  Nicholas Lose, MD 10/19/2019  I, Cloyde Reams Dorshimer, am acting as scribe for Dr. Nicholas Lose.  I have reviewed the above documentation for accuracy and completeness, and I agree with the above.

## 2019-10-19 ENCOUNTER — Encounter: Payer: Self-pay | Admitting: *Deleted

## 2019-10-19 ENCOUNTER — Other Ambulatory Visit: Payer: Self-pay

## 2019-10-19 ENCOUNTER — Inpatient Hospital Stay: Payer: Medicaid Other | Attending: Hematology and Oncology | Admitting: Hematology and Oncology

## 2019-10-19 DIAGNOSIS — Z9221 Personal history of antineoplastic chemotherapy: Secondary | ICD-10-CM | POA: Diagnosis not present

## 2019-10-19 DIAGNOSIS — Z7984 Long term (current) use of oral hypoglycemic drugs: Secondary | ICD-10-CM | POA: Insufficient documentation

## 2019-10-19 DIAGNOSIS — Z170 Estrogen receptor positive status [ER+]: Secondary | ICD-10-CM

## 2019-10-19 DIAGNOSIS — Z9013 Acquired absence of bilateral breasts and nipples: Secondary | ICD-10-CM | POA: Insufficient documentation

## 2019-10-19 DIAGNOSIS — C50812 Malignant neoplasm of overlapping sites of left female breast: Secondary | ICD-10-CM

## 2019-10-19 DIAGNOSIS — Z79899 Other long term (current) drug therapy: Secondary | ICD-10-CM | POA: Insufficient documentation

## 2019-10-19 DIAGNOSIS — C773 Secondary and unspecified malignant neoplasm of axilla and upper limb lymph nodes: Secondary | ICD-10-CM | POA: Diagnosis not present

## 2019-10-19 NOTE — Assessment & Plan Note (Signed)
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 Neoadjuvant chemotherapy with dose dense Adriamycin and Cytoxan given every 3 weeks without Neulasta support given her pregnancycompleted 05/28/2019 2. Post-Partumweekly Taxol x12starting 07/03/2019 3. Bilateral mastectomy 10/08/2019: Right mastectomy: Multifocal grade 2 IDC 0.8 cm and 0.6 cm with high-grade DCIS, margins negative, 0/4 lymph nodes ER 95%, PR 5%, Ki-67 10%, HER-2 negative; left mastectomy: IDC 0.6 cm, LVI, margins negative, 1/4 lymph nodes positive, ER 60%, PR 60%, HER-2 negative, Ki-67 50% RCB class II 4. Adjuvant radiation therapy 5. Followed by adjuvant antiestrogen therapy with complete estrogen blockade (ovarian suppression with AI) CT C/A/P on 5/13 shows sclerotic bone lesions, bone scan negative 6.  +/- CDK 4 and 6 inhibitor with antiestrogen therapy ----------------------------------------------------------------------------------------------------------------------------------------------------- Pathology counseling: I discussed the final pathology report of the patient provided  a copy of this report. I discussed the margins as well as lymph node surgeries. We also discussed the final staging along with previously performed ER/PR and HER-2/neu testing.  Treatment plan: Adjuvant radiation followed by adjuvant antiestrogen therapy with complete estrogen blockade plus or minus CDK 4 and 6 inhibitor

## 2019-10-20 ENCOUNTER — Telehealth: Payer: Self-pay | Admitting: Hematology and Oncology

## 2019-10-20 NOTE — Telephone Encounter (Signed)
Scheduled per 8/9 los. Called and spoke with pt confirmed added appts

## 2019-10-21 ENCOUNTER — Encounter: Payer: Self-pay | Admitting: Licensed Clinical Social Worker

## 2019-10-21 NOTE — Progress Notes (Signed)
[facility] CSW Progress Note  Clinical Education officer, museum contacted patient by phone to check-in on coping post-surgery. Melissa [name] reports that she is adjusting pretty well. It was helpful to her to hear a good report after her visit with Dr. Lindi Adie. She is tired some days with recovering and caring for her daughters. She still has some help from her sister, nieces, mom, and her daughter's aunt but it is less than the initial days immediately post-surgery. CSW provided empathic listening and problem-solved ways to reserve energy on more stressful tasks (like grocery shopping). [name] has also been working on focusing on what is important and ignoring outside drama. She is still relying heavily on her faith.  CSW will check-in by phone in a few weeks for ongoing support. Patient can call if needed prior to next check-in.   Edwinna Areola Jeaneane Adamec , LCSW

## 2019-10-23 NOTE — Progress Notes (Signed)
Location of Breast Cancer: Malignant neoplasm of overlapping sites of LEFT breast, estrogen receptor positive   Histology per Pathology Report:  10/08/2019 FINAL MICROSCOPIC DIAGNOSIS:  A. BREAST, RIGHT, MASTECTOMY:  - Invasive ductal carcinoma, multifocal.  - High-grade ductal carcinoma in situ.  - Margins not involved.  - Three lymph nodes with no metastatic carcinoma (0/3).  - See oncology table.  B. LYMPH NODE, RIGHT AXILLARY, EXCISION:  - One lymph node with no metastatic carcinoma (0/1).  - Pigment deposition suggestive of dermatopathic changes.  C. BREAST, LEFT, MASTECTOMY:  - Invasive ductal carcinoma, 0.6 cm.  - Lymphovascular space involvement by tumor.  - Margins not involved.  - See oncology table.  D. LYMPH NODE, LEFT AXILLARY, REGIONAL RESECTION:  - Metastatic carcinoma in one of four lymph nodes (1/4).  Receptor Status: ER(60%), PR (60%), Her2-neu (negative), Ki-67(50%)  Did patient present with symptoms (if so, please note symptoms) or was this found on screening mammography?:  The patient noted left breast swelling and hardness for 4 months. Diagnostic mammogram on 02/19/19 showed calcifications spanning at least 8cm x 5cm, with 4 abnormal left axillary lymph nodes, and skin thickening.  Past/Anticipated interventions by surgeon, if any: 10/08/2019 Dr. Rolm Bookbinder 1. Right risk reducing mastectomy 2. Left mastectomy 3. Left targeted deep axillary node dissection  Past/Anticipated interventions by medical oncology, if any:  Under care of Dr. Nicholas Lose 10/19/2019 1. Neoadjuvant chemotherapy with dose dense Adriamycin and Cytoxan given every 3 weeks without Neulasta support given her pregnancycompleted 05/28/2019 2. Post-Partumweekly Taxol x12starting 07/03/2019 3. Bilateral mastectomy 10/08/2019: Right mastectomy: Multifocal grade 2 IDC 0.8 cm and 0.6 cm with high-grade DCIS, margins negative, 0/4 lymph nodes ER 95%, PR 5%, Ki-67 10%, HER-2 negative;  left mastectomy: IDC 0.6 cm, LVI, margins negative, 1/4 lymph nodes positive, ER 60%, PR 60%, HER-2 negative, Ki-67 50% RCB class II 4. Adjuvant radiation therapy 5. Followed by adjuvant antiestrogen therapy with complete estrogen blockade (ovarian suppression with AI) CT C/A/P on 5/13 shows sclerotic bone lesions, bone scan negative 6.  +/- CDK 4 and 6 inhibitor with antiestrogen therapy Return to clinic at the end of radiation to start antiestrogen therapy  Lymphedema issues, if any:  Patient denies    Pain issues, if any:  Discomfort under left arm since surgery. Patient feels it makes some range of motion slightly difficult    SAFETY ISSUES:  Prior radiation? No  Pacemaker/ICD? No  Possible current pregnancy? No  Is the patient on methotrexate? No  Current Complaints / other details:  Patient denies

## 2019-10-27 ENCOUNTER — Other Ambulatory Visit: Payer: Self-pay

## 2019-10-27 ENCOUNTER — Encounter: Payer: Self-pay | Admitting: Radiation Oncology

## 2019-10-27 ENCOUNTER — Telehealth: Payer: Self-pay | Admitting: *Deleted

## 2019-10-27 ENCOUNTER — Ambulatory Visit
Admission: RE | Admit: 2019-10-27 | Discharge: 2019-10-27 | Disposition: A | Discharge status: Home / Self Care | Payer: Medicaid Other | Source: Ambulatory Visit | Attending: Radiation Oncology | Admitting: Radiation Oncology

## 2019-10-27 DIAGNOSIS — Z170 Estrogen receptor positive status [ER+]: Secondary | ICD-10-CM

## 2019-10-27 DIAGNOSIS — C50812 Malignant neoplasm of overlapping sites of left female breast: Secondary | ICD-10-CM

## 2019-10-27 NOTE — Progress Notes (Signed)
Radiation Oncology         [phone] ________________________________  Initial outpatient Consultation by telephone.  The patient opted for telemedicine to maximize safety during the pandemic.  MyChart video was not obtainable.  Name: Melissa [name] MRN: [id number]  Date: 10/27/2019  DOB: 12/12/1990  CC:[name], NP  Nicholas Lose, MD   REFERRING PHYSICIAN: Nicholas Lose, MD  DIAGNOSIS:    ICD-10-CM   1. Malignant neoplasm of overlapping sites of left breast in female, estrogen receptor positive ([place])  C50.812    Z17.0    Cancer Staging Malignant neoplasm of overlapping sites of left breast in female, estrogen receptor positive ([place]) Staging form: Breast, AJCC 8th Edition - Clinical stage from 03/04/2019: Stage IIIB (cT4, cN1, cM0, G3, ER+, PR+, HER2-) - Signed by Nicholas Lose, MD on 03/04/2019  Left breast cancer: ypT1b, ypN1a Right breast cancer: ypT1b (multifocal), ypN0   CHIEF COMPLAINT: Here to discuss management of bilateral breast cancer  Did patient present with symptoms (if so, please note symptoms) or was this found on screening mammography?:  The patient noted left breast swelling and hardness for 4 months. Diagnostic mammogram on 02/19/19 showed calcifications spanning at least 8cm x 5cm, with 4 abnormal left axillary lymph nodes, and skin thickening.  CT C/A/P on 5/13 shows sclerotic bone lesions, bone scan was negative.  She was pregnant at diagnosis. She underwent, under Dr Geralyn Flash care,  1. Neoadjuvant chemotherapy with dose dense Adriamycin and Cytoxan given every 3 weeks without Neulasta support given her pregnancycompleted 05/28/2019 2. Post-Partumweekly Taxol x12starting 07/03/2019  Then, on 10/08/2019 Dr. Rolm Bookbinder performed 1. Right risk reducing mastectomy 2. Left mastectomy 3. Left targeted deep axillary node dissection  This revealed:  Right mastectomy: Multifocal grade 2 IDC 0.8 cm and 0.6 cm with high-grade DCIS, margins  negative, 0/4 lymph nodes ER 95%, PR 5%, Ki-67 10%, HER-2 negative; left mastectomy: IDC 0.6 cm, LVI, margins negative, 1/4 lymph nodes positive, ER 60%, PR 60%, HER-2 negative, Ki-67 50%   Lymphedema issues, if any:  Patient denies    Pain issues, if any:  Discomfort under left arm since surgery. Patient feels it makes some range of motion slightly difficult    Her drains have been removed.  She reports that her scars have not completely closed up.  SAFETY ISSUES:  Prior radiation? No  Pacemaker/ICD? No  Possible current pregnancy? No  Is the patient on methotrexate? No  Current Complaints / other details: She has a 92-year-old and now a 71-monthold baby.  She lives with her mother.  She is not currently working outside the home.  She smokes 3 cigarettes/day.  She has not yet received her Covid vaccine.   PREVIOUS RADIATION THERAPY: No  PAST MEDICAL HISTORY:  has a past medical history of Abnormal Pap smear (07/13/11), Anemia, Cancer ([place], [place], Family history of breast cancer, Family history of colon cancer, Family history of throat cancer, Fracture of distal phalanx of finger (10/05/2019), and Herpes simplex without mention of complication.    PAST SURGICAL HISTORY: Past Surgical History:  Procedure Laterality Date  . BREAST BIOPSY Left 03/18/2019   Procedure: LEFT BREAST SKIN PUNCH BIOPSY;  Surgeon: WRolm Bookbinder MD;  [place]  Service: General;  Laterality: Left;  . COLPOSCOPY     CIN I ECC -  . MASTECTOMY Bilateral 10/08/2019   BILATERAL MASTECTOMY WITH LEFT AXILLARY SENTINEL LYMPH NODE BIOPSY (Bilateral Breast)   . MASTECTOMY W/ SENTINEL NODE BIOPSY Bilateral 10/08/2019  Procedure: BILATERAL MASTECTOMY WITH LEFT AXILLARY SENTINEL LYMPH NODE BIOPSY;  Surgeon: Rolm Bookbinder, MD;  Location: [place];  Service: General;  Laterality: Bilateral;  BILATERAL PEC BLOCK  . NO PAST SURGERIES    . PORTACATH PLACEMENT N/A 03/18/2019    Procedure: INSERTION PORT-A-CATH WITH ULTRASOUND GUIDANCE;  Surgeon: Rolm Bookbinder, MD;  Location: [place];  Service: General;  Laterality: N/A;  . RADIOACTIVE SEED GUIDED AXILLARY SENTINEL LYMPH NODE Left 10/08/2019   Procedure: LEFT RADIOACTIVE SEED GUIDED AXILLARY SENTINEL LYMPH NODE EXCISION;  Surgeon: Rolm Bookbinder, MD;  Location: [place];  Service: General;  Laterality: Left;  PEC BLOCK    FAMILY HISTORY: family history includes Breast cancer in her cousin and paternal grandmother; Cancer in her maternal aunt and paternal uncle; Colon cancer in her paternal uncle; Hypertension in her father; Stroke (age of onset: 59) in her father; Throat cancer in her maternal grandmother.  SOCIAL HISTORY:  reports that she quit smoking about 7 months ago. Her smoking use included cigarettes. She has never used smokeless tobacco. She reports previous alcohol use. She reports current drug use. Frequency: 2.00 times per week. Drug: Marijuana.  ALLERGIES: Patient has no known allergies.  MEDICATIONS:  Current Outpatient Medications  Medication Sig Dispense Refill  . carvedilol (COREG) 3.125 MG tablet Take 1 tablet (3.125 mg total) by mouth 2 (two) times daily. 60 tablet 11  . dapagliflozin propanediol (FARXIGA) 10 MG TABS tablet Take 1 tablet (10 mg total) by mouth daily before breakfast. 30 tablet 11  . furosemide (LASIX) 20 MG tablet Take 1 tablet (20 mg total) by mouth daily as needed for fluid or edema. 90 tablet 2  . gabapentin (NEURONTIN) 100 MG capsule Take 1 capsule (100 mg total) by mouth at bedtime. 30 capsule 0  . sacubitril-valsartan (ENTRESTO) 97-103 MG Take 1 tablet by mouth 2 (two) times daily. 60 tablet 11  . spironolactone (ALDACTONE) 25 MG tablet Take 1 tablet (25 mg total) by mouth daily. 90 tablet 3  . lidocaine-prilocaine (EMLA) cream APPLY 1 APPLICATION TOPICALLY ONCE FOR 1 DOSE. (Patient not taking: Reported on 09/28/2019)    . methocarbamol (ROBAXIN) 500 MG tablet Take 1 tablet  (500 mg total) by mouth 3 (three) times daily. (Patient not taking: Reported on 10/27/2019) 30 tablet 1  . oxyCODONE (OXY IR/ROXICODONE) 5 MG immediate release tablet Take 1 tablet (5 mg total) by mouth every 4 (four) hours as needed for moderate pain. (Patient not taking: Reported on 10/27/2019) 15 tablet 0   No current facility-administered medications for this encounter.    REVIEW OF SYSTEMS: As above.   PHYSICAL EXAM:  vitals were not taken for this visit.   General: Alert and oriented, in no acute distress    LABORATORY DATA:  Lab Results  Component Value Date   WBC 4.9 09/30/2019   HGB 9.9 (L) 10/08/2019   HCT 29.0 (L) 10/08/2019   MCV 98.0 09/30/2019   PLT 283 09/30/2019   CMP     Component Value Date/Time   NA 138 10/08/2019 1508   K 3.9 10/08/2019 1508   CL 99 10/08/2019 1508   CO2 28 09/30/2019 1353   GLUCOSE 94 10/08/2019 1508   BUN 19 10/08/2019 1508   CREATININE 0.90 10/08/2019 1508   CREATININE 0.79 09/18/2019 1205   CALCIUM 9.3 09/30/2019 1353   PROT 7.3 09/30/2019 1353   ALBUMIN 3.9 09/30/2019 1353   AST 22 09/30/2019 1353   AST 18 09/18/2019 1205   ALT 12 09/30/2019 1353  ALT 13 09/18/2019 1205   ALKPHOS 59 09/30/2019 1353   BILITOT 0.6 09/30/2019 1353   BILITOT 0.4 09/18/2019 1205   GFRNONAA >60 09/30/2019 1353   GFRNONAA >60 09/18/2019 1205   GFRAA >60 09/30/2019 1353   GFRAA >60 09/18/2019 1205         RADIOGRAPHY: NM Sentinel Node Inj-No Rpt (Breast)  Result Date: 10/08/2019 Sulfur colloid was injected by the nuclear medicine technologist for melanoma sentinel node.   MM Breast Surgical Specimen  Result Date: 10/08/2019 CLINICAL DATA:  Post left axilla excision. Note that the radioactive seed was located in a lymph node 3 cm superior to the [facility] clip in the previously biopsied lymph node. EXAM: SPECIMEN RADIOGRAPH OF THE LEFT BREAST COMPARISON:  Previous exam(s). FINDINGS: Status post excision of the left breast. The radioactive seed  only is present, completely intact, and marked for pathology. IMPRESSION: Specimen radiograph of the left breast. Electronically Signed   By: Everlean Alstrom M.D.   On: 10/08/2019 15:10   [place] LT RADIOACTIVE SEED LOC  Result Date: 10/07/2019 CLINICAL DATA:  Pre bilateral mastectomy localization of a left axillary lymph node biopsy on 02/26/2019, demonstrating a metastatic node. The patient has recently completed neoadjuvant chemotherapy. EXAM: ULTRASOUND GUIDED RADIOACTIVE SEED LOCALIZATION OF THE LEFT AXILLA COMPARISON:  Previous exam(s). FINDINGS: Patient presents for radioactive seed localization prior to bilateral mastectomy and left axillary lymph node resection. I met with the patient and we discussed the procedure of seed localization including benefits and alternatives. We discussed the high likelihood of a successful procedure. We discussed the risks of the procedure including infection, bleeding, tissue injury and further surgery. We discussed the low dose of radioactivity involved in the procedure. Informed, written consent was given. The usual time-out protocol was performed immediately prior to the procedure. Preliminary ultrasound evaluation of the left axilla demonstrated multiple normal appearing left axillary lymph nodes. There was 1 node with a small linear, brightly echogenic focus at the same location as the [facility] clip seen in a similar appearing lymph node at time of biopsy on 02/26/2019. Using ultrasound guidance, sterile technique, 1% lidocaine and an I-125 radioactive seed, the lymph nodes seen with the small linear right echogenic focus in the left axilla was localized using an inferolateral approach. The follow-up mammogram images confirm the seed in the expected location and were marked for Dr. [name]. Follow-up survey of the patient confirms presence of the radioactive seed. Order number of I-125 seed:  [id number]. Total activity:  0.233 mCi reference Date: 10/01/2019 A post  procedure 3D spot compression mammogram the left axilla was obtained in the oblique projection. The radioactive seed is located in a lymph node 3 cm superior to the [facility] clip in the previously biopsied lymph node. The patient tolerated the procedure well and was released from the [facility]. She was given instructions regarding seed removal. IMPRESSION: Radioactive seed localization left axilla. The radioactive seed is located in a lymph node 3 cm superior to the previously biopsied lymph node containing the [facility] biopsy marker clip. Therefore, at the time of resection of the lymph node containing the seed, resection of the nodes more inferiorly in the left axilla is recommended. This has been discussed with Dr. [name]. No apparent complications. Electronically Signed   By: Claudie Revering M.D.   On: 10/07/2019 15:38   MM CLIP PLACEMENT LEFT  Result Date: 10/09/2019 CLINICAL DATA:  Status post ultrasound-guided left axillary radioactive seed placement. EXAM: DIAGNOSTIC LEFT MAMMOGRAM POST ULTRASOUND-GUIDED RADIOACTIVE SEED PLACEMENT  COMPARISON:  Previous exam(s). FINDINGS: Mammographic images were obtained following ultrasound-guided radioactive seed placement. These demonstrate the radioactive seed 3 cm superior to the spiral shaped [employer] biopsy marker clip. IMPRESSION: Radioactive seed 3 cm superior to the spiral shaped [employer] biopsy marker clip. Therefore, the localized node containing a bright echogenic focus does not correspond to the previously biopsied node. This was discussed with Dr. [name] following the procedure with a recommendation for surgical excision of the left axillary nodes inferior to the seed, in addition to the node containing the seed. Final Assessment: Post Procedure Mammograms for Seed Placement Electronically Signed   By: Claudie Revering M.D.   On: 10/09/2019 09:16      IMPRESSION/PLAN: Bilateral breast cancer  It was a pleasure meeting the patient today. We  discussed the risks, benefits, and side effects of radiotherapy.  She understands that the right-sided breast cancer which was incidentally revealed after risk reducing mastectomy is significantly lower risk than the left-sided breast cancer.  I do not think right sided radiotherapy would improve her prognosis but certainly it would help if we give radiotherapy to the left chest wall and regional nodes. I recommend radiotherapy to the left chest wall and regional nodes to reduce her risk of locoregional recurrence by 2/3.  We discussed that radiation would take approximately 6 weeks to complete and that I would give the patient a few weeks to heal following surgery before starting treatment planning.  We spoke about acute effects including skin irritation and fatigue as well as much less common late effects including internal organ injury or irritation. We spoke about the latest technology that is used to minimize the risk of late effects for patients undergoing radiotherapy to the breast or chest wall. No guarantees of treatment were given. The patient is enthusiastic about proceeding with treatment. I look forward to participating in the patient's care.  I have placed an order for CT simulation/treatment planning to take place in the next 2 to 3 weeks.  I asked the patient today about tobacco use. The patient uses tobacco.  I advised the patient to quit. Services were offered by me today including outpatient counseling and pharmacotherapy. I assessed for the willingness to attempt to quit and provided encouragement and demonstrated willingness to make referrals and/or prescriptions to help the patient attempt to quit. The patient has follow-up with the oncologic team to touch base on their tobacco use and /or cessation efforts.  Over 5 minutes were spent on this issue.  She is going to call 1 800 quit now and use lozenges or nicotine gum, she is smoking 3 cigarettes a day.  She has established a quit date:  10-30-19.   We discussed measures to reduce the risk of infection/morbidity during the COVID-19 pandemic.  We discussed the importance of social distancing and masking and reducing contacts as well as getting vaccinated.  She has not yet been vaccinated.  I highly recommended that she do so as soon as possible.  She is now willing to sign up and I helped her get an appointment at [facility] this Friday for [employer].  On date of service, in total, I spent 60 minutes on this encounter. This encounter was provided by telemedicine platform by telephone.  The patient opted for telemedicine to maximize safety during the pandemic.  MyChart video was not obtainable. The patient has given verbal consent for this type of encounter and has been advised to only accept a meeting of this type in a  secure network environment. The attendants for this meeting include Eppie Gibson  and Alberteen Spindle.  During the encounter, Eppie Gibson was located at [facility] Radiation Oncology Department.  Ramonica Grigg was located at home.    __________________________________________   Eppie Gibson, MD

## 2019-10-27 NOTE — Telephone Encounter (Signed)
CALLED PATIENT TO INFORM OF LAB ON 11-09-19 @ 9 AM  @ [place], SPOKE WITH PATIENT AND SHE IS AWARE OF THIS APPT.

## 2019-10-28 ENCOUNTER — Encounter: Payer: Self-pay | Admitting: *Deleted

## 2019-10-29 ENCOUNTER — Ambulatory Visit: Payer: Medicaid Other | Attending: General Surgery | Admitting: Rehabilitation

## 2019-10-29 ENCOUNTER — Encounter: Payer: Self-pay | Admitting: Rehabilitation

## 2019-10-29 ENCOUNTER — Other Ambulatory Visit: Payer: Self-pay

## 2019-10-29 DIAGNOSIS — Z483 Aftercare following surgery for neoplasm: Secondary | ICD-10-CM | POA: Insufficient documentation

## 2019-10-29 DIAGNOSIS — M25612 Stiffness of left shoulder, not elsewhere classified: Secondary | ICD-10-CM | POA: Diagnosis present

## 2019-10-29 DIAGNOSIS — M25611 Stiffness of right shoulder, not elsewhere classified: Secondary | ICD-10-CM | POA: Diagnosis present

## 2019-10-29 NOTE — Therapy (Signed)
[place], [place], [address] Phone: [phone]   Fax:  [phone]  Physical Therapy Evaluation  Patient Details  Name: Breanah Faddis MRN: [id number] Date of Birth: 19-Jan-1991 Referring Provider (PT): Dr. [name]   Encounter Date: 10/29/2019   PT End of Session - 10/29/19 1453    Visit Number 1    Number of Visits 13    Date for PT Re-Evaluation 12/10/19    Authorization Type Medicaid [employer]    Authorization Time Period 10/29/19 - 11/28/19 (3 in 30 days to start) the 27 max    Authorization - Visit Number 1    Authorization - Number of Visits 4    PT Start Time 1408    PT Stop Time 1450    PT Time Calculation (min) 42 min    Activity Tolerance Patient tolerated treatment well;Treatment limited secondary to medical complications (Comment)    Behavior During Therapy [facility] for tasks assessed/performed           Past Medical History:  Diagnosis Date  . Abnormal Pap smear 07/13/11   ASCUS +HPV  . Anemia   . Cancer [facility])    breast cancer  . CHF (congestive heart failure) ([place])   . Family history of breast cancer   . Family history of colon cancer   . Family history of throat cancer   . Fracture of distal phalanx of finger 10/05/2019   Right thumb and middle finger  . Herpes simplex without mention of complication    dx age 29    Past Surgical History:  Procedure Laterality Date  . BREAST BIOPSY Left 03/18/2019   Procedure: LEFT BREAST SKIN PUNCH BIOPSY;  Surgeon: Rolm Bookbinder, MD;  Location: [place];  Service: General;  Laterality: Left;  . COLPOSCOPY     CIN I ECC -  . MASTECTOMY Bilateral 10/08/2019   BILATERAL MASTECTOMY WITH LEFT AXILLARY SENTINEL LYMPH NODE BIOPSY (Bilateral Breast)   . MASTECTOMY W/ SENTINEL NODE BIOPSY Bilateral 10/08/2019   Procedure: BILATERAL MASTECTOMY WITH LEFT AXILLARY SENTINEL LYMPH NODE BIOPSY;  Surgeon: Rolm Bookbinder, MD;  Location: [place];  Service:  General;  Laterality: Bilateral;  BILATERAL PEC BLOCK  . NO PAST SURGERIES    . PORTACATH PLACEMENT N/A 03/18/2019   Procedure: INSERTION PORT-A-CATH WITH ULTRASOUND GUIDANCE;  Surgeon: Rolm Bookbinder, MD;  Location: [place];  Service: General;  Laterality: N/A;  . RADIOACTIVE SEED GUIDED AXILLARY SENTINEL LYMPH NODE Left 10/08/2019   Procedure: LEFT RADIOACTIVE SEED GUIDED AXILLARY SENTINEL LYMPH NODE EXCISION;  Surgeon: Rolm Bookbinder, MD;  Location: [place];  Service: General;  Laterality: Left;  PEC BLOCK    There were no vitals filed for this visit.    Subjective Assessment - 10/29/19 1411    Subjective I feel fine. The right arm is moving well but the Lt arm is swollen.    Pertinent History Bil breast cancer with completion of neoadjuvant chemotherapy with adriamycin and cytoxan and then taxol, bilateral mastectomy 10/08/19 with Dr. [name] with 0/4 nodes on the Rt and 1/4 nodes on the Lt with IDC findings bilaterally.  ER/PR positive.  Radiation starting soon just the Lt side  Reconstruction possible.    Patient Stated Goals get the information I need    Currently in Pain? No/denies              [facility] PT Assessment - 10/29/19 0001      Assessment   Medical Diagnosis bil breast cancer  Referring Provider (PT) Dr. [name]    Onset Date/Surgical Date 10/08/19    Hand Dominance Right    Prior Therapy no      Precautions   Precaution Comments lymphedema bil      Restrictions   Weight Bearing Restrictions No      Balance Screen   Has the patient fallen in the past 6 months Yes   fell on a toy of daughters   How many times? 1    Has the patient had a decrease in activity level because of a fear of falling?  No    Is the patient reluctant to leave their home because of a fear of falling?  No      Home Environment   Living Environment Private residence    Living Arrangements Children;Parent   31 and 4 months   Available Help at Discharge Family    Additional Comments  reaching       Prior Function   Level of [place] Unemployed    Leisure I have been wanting to work out; I normally do [employer]       Cognition   Overall Cognitive Status Within Functional Limits for tasks assessed      Observation/Other Assessments   Observations pt with gauze bandages over bilateral mastecotmy sites bil sites with poor healing open spots with sloughing yellow and red tissue.  Pt has been covering these with gauze and doing wet removal at home      Posture/Postural Control   Posture/Postural Control Postural limitations    Postural Limitations Rounded Shoulders;Forward head             LYMPHEDEMA/ONCOLOGY QUESTIONNAIRE - 10/29/19 0001      Type   Cancer Type bil breast cancer      Surgeries   Mastectomy Date 10/08/19    Sentinel Lymph Node Biopsy Date 10/08/19    Number Lymph Nodes Removed --   oncology note stating 4 on each but pt reporting 0 Rt, 4 Lt     Treatment   Active Chemotherapy Treatment No    Past Chemotherapy Treatment Yes    Active Radiation Treatment No    Past Radiation Treatment No    Current Hormone Treatment No      What other symptoms do you have   Are you Having Heaviness or Tightness No      Lymphedema Assessments   Lymphedema Assessments Upper extremities      Right Upper Extremity Lymphedema   10 cm Proximal to Olecranon Process 32.5 cm    Olecranon Process 26 cm    10 cm Proximal to Ulnar Styloid Process 21 cm    Just Proximal to Ulnar Styloid Process 16 cm    Across Hand at [employer] 19.5 cm    At [place] of 2nd Digit 6.6 cm      Left Upper Extremity Lymphedema   10 cm Proximal to Olecranon Process 32.8 cm    Olecranon Process 25.8 cm    10 cm Proximal to Ulnar Styloid Process 21.4 cm    Just Proximal to Ulnar Styloid Process 16.3 cm    Across Hand at [employer] 19.3 cm    At [place] of 2nd Digit 6.6 cm                 Quick Dash - 10/29/19 0001    Open a tight or  new jar Mild difficulty    Do  heavy household chores (wash walls, wash floors) Mild difficulty    Carry a shopping bag or briefcase Moderate difficulty    Wash your back Moderate difficulty    Use a knife to cut food No difficulty    Recreational activities in which you take some force or impact through your arm, shoulder, or hand (golf, hammering, tennis) Moderate difficulty    During the past week, to what extent has your arm, shoulder or hand problem interfered with your normal social activities with family, friends, neighbors, or groups? Slightly    During the past week, to what extent has your arm, shoulder or hand problem limited your work or other regular daily activities Modererately    Arm, shoulder, or hand pain. Mild    Tingling (pins and needles) in your arm, shoulder, or hand None    Difficulty Sleeping Mild difficulty    DASH Score 29.55 %            Objective measurements completed on examination: See above findings.       [facility] Adult PT Treatment/Exercise - 10/29/19 0001      Self-Care   Self-Care Other Self-Care Comments    Other Self-Care Comments  education on lymphedema and risk reduction / surveillance.  Educated pt on focusing on skin healing / incision closing and not doing too much stretch at this point.                   PT Education - 10/29/19 1453    Education Details POC, wound healing importance, lymphedema and risk reduction    Person(s) Educated Patient    Methods Explanation;Handout    Comprehension Verbalized understanding               PT Long Term Goals - 10/29/19 1546      PT LONG TERM GOAL #1   Title Pt will be educated on lymphedema surveillance and risk reduction    Time 8    Period Weeks    Status Achieved      PT LONG TERM GOAL #2   Title Pt will demonstrate WNL shoulder ROM bilaterally with no sig pain or pulling    Time 8    Period Weeks    Status New      PT LONG TERM GOAL #3   Title Pt will be ind with  strength ABC program for continued strength and mobility    Time 8    Period Weeks    Status New      PT LONG TERM GOAL #4   Title Pt will decrease QDASH to 12% or less    Baseline 35%    Time 8    Period Weeks    Status New                  Plan - 10/29/19 1455    Clinical Impression Statement Pt presents post bilateral mastectomy due to main lt breast cancer with Rt mastectomy also revealing micro invasion upon removal.  pt is currently struggling with wound healing with open incisions bilaterally and instruction on self dressing changes.  Delayed ROM testing and focused on circumferential measurements and lymphedema education due to healing status.  Pt will recheck in around 2.5 weeks.    Personal Factors and Comorbidities Comorbidity 2    Comorbidities chemotherapy, bilateral breast cancer    Examination-Activity Limitations Lift;Reach Overhead;Caring for Others    Examination-Participation Restrictions Cleaning;Yard Work    Production manager  complexity    Clinical Decision Making Moderate    Rehab Potential Excellent    PT Frequency 2x / week    PT Duration 6 weeks   after initial 3 medicaid visits   PT Treatment/Interventions ADLs/Self Care Home Management;Therapeutic exercise;Patient/family education;Passive range of motion;Splinting;Manual lymph drainage;Manual techniques    PT Next Visit Plan skin/healing check, measure AROM, assess any stretches/HEP or futher treatment needed    Consulted and Agree with Plan of Care Patient           Patient will benefit from skilled therapeutic intervention in order to improve the following deficits and impairments:  Decreased mobility, Decreased scar mobility, Postural dysfunction, Decreased activity tolerance, Decreased range of motion, Impaired UE functional use  Visit Diagnosis: Aftercare following surgery for neoplasm  Stiffness of left shoulder, not elsewhere classified  Stiffness of  right shoulder, not elsewhere classified     Problem List Patient Active Problem List   Diagnosis Date Noted  . Breast cancer, left breast ([place]) 10/08/2019  . Genetic testing 04/15/2019  . Port-A-Cath in place 03/25/2019  . Family history of breast cancer   . Family history of colon cancer   . Family history of throat cancer   . Malignant neoplasm of overlapping sites of left breast in female, estrogen receptor positive ([place]) 03/04/2019  . PCOS (polycystic ovarian syndrome) 08/22/2012  . Anovulation 07/09/2012  . ASCUS (atypical squamous cells of undetermined significance) on Pap smear 12/04/2010    Stark Bray 10/29/2019, 3:49 PM  [place], [place], [address] Phone: [phone]   Fax:  [phone]  Name: Deniese Oberry MRN: [id number] Date of Birth: [birth year]

## 2019-11-09 ENCOUNTER — Ambulatory Visit: Payer: Medicaid Other | Admitting: Radiation Oncology

## 2019-11-09 ENCOUNTER — Ambulatory Visit: Payer: Medicaid Other

## 2019-11-09 DIAGNOSIS — Z3202 Encounter for pregnancy test, result negative: Secondary | ICD-10-CM | POA: Insufficient documentation

## 2019-11-09 DIAGNOSIS — C50812 Malignant neoplasm of overlapping sites of left female breast: Secondary | ICD-10-CM | POA: Insufficient documentation

## 2019-11-10 ENCOUNTER — Ambulatory Visit
Admission: RE | Admit: 2019-11-10 | Discharge: 2019-11-10 | Disposition: A | Discharge status: Home / Self Care | Payer: Medicaid Other | Source: Ambulatory Visit | Attending: Radiation Oncology | Admitting: Radiation Oncology

## 2019-11-10 ENCOUNTER — Other Ambulatory Visit: Payer: Self-pay

## 2019-11-10 ENCOUNTER — Telehealth: Payer: Self-pay | Admitting: Licensed Clinical Social Worker

## 2019-11-10 DIAGNOSIS — Z3202 Encounter for pregnancy test, result negative: Secondary | ICD-10-CM | POA: Diagnosis not present

## 2019-11-10 DIAGNOSIS — C50812 Malignant neoplasm of overlapping sites of left female breast: Secondary | ICD-10-CM | POA: Diagnosis not present

## 2019-11-10 DIAGNOSIS — Z170 Estrogen receptor positive status [ER+]: Secondary | ICD-10-CM

## 2019-11-10 LAB — PREGNANCY, URINE: Preg Test, Ur: NEGATIVE

## 2019-11-10 NOTE — Telephone Encounter (Signed)
[place] CSW Progress Note  Clinical Education officer, museum contacted patient by phone to provide ongoing coping support. Patient unable to talk at this time, stated she will call back when she has time.    Edwinna Areola Comfort Iversen , LCSW

## 2019-11-17 ENCOUNTER — Ambulatory Visit: Payer: Medicaid Other

## 2019-11-18 ENCOUNTER — Encounter: Payer: Self-pay | Admitting: *Deleted

## 2019-11-18 ENCOUNTER — Ambulatory Visit: Payer: Medicaid Other

## 2019-11-18 ENCOUNTER — Telehealth: Payer: Self-pay | Admitting: Hematology and Oncology

## 2019-11-18 NOTE — Telephone Encounter (Signed)
Scheduled appt per 9/08 sch msg - mailed reminder letter with appt date and time

## 2019-11-19 ENCOUNTER — Other Ambulatory Visit: Payer: Self-pay

## 2019-11-19 ENCOUNTER — Ambulatory Visit: Payer: Medicaid Other

## 2019-11-19 ENCOUNTER — Ambulatory Visit: Payer: Medicaid Other | Attending: General Surgery | Admitting: Rehabilitation

## 2019-11-19 ENCOUNTER — Encounter: Payer: Self-pay | Admitting: Rehabilitation

## 2019-11-19 ENCOUNTER — Inpatient Hospital Stay: Payer: Medicaid Other | Attending: Hematology and Oncology

## 2019-11-19 DIAGNOSIS — Z170 Estrogen receptor positive status [ER+]: Secondary | ICD-10-CM | POA: Diagnosis not present

## 2019-11-19 DIAGNOSIS — Z95828 Presence of other vascular implants and grafts: Secondary | ICD-10-CM

## 2019-11-19 DIAGNOSIS — Z483 Aftercare following surgery for neoplasm: Secondary | ICD-10-CM | POA: Insufficient documentation

## 2019-11-19 DIAGNOSIS — M25612 Stiffness of left shoulder, not elsewhere classified: Secondary | ICD-10-CM | POA: Diagnosis present

## 2019-11-19 DIAGNOSIS — Z452 Encounter for adjustment and management of vascular access device: Secondary | ICD-10-CM | POA: Diagnosis not present

## 2019-11-19 DIAGNOSIS — C50812 Malignant neoplasm of overlapping sites of left female breast: Secondary | ICD-10-CM | POA: Insufficient documentation

## 2019-11-19 DIAGNOSIS — Z9013 Acquired absence of bilateral breasts and nipples: Secondary | ICD-10-CM | POA: Diagnosis not present

## 2019-11-19 DIAGNOSIS — M25611 Stiffness of right shoulder, not elsewhere classified: Secondary | ICD-10-CM

## 2019-11-19 MED ORDER — SODIUM CHLORIDE 0.9% FLUSH
10.0000 mL | Freq: Once | INTRAVENOUS | Status: AC
  Administered 2019-11-19: 10 mL
  Filled 2019-11-19: qty 10

## 2019-11-19 MED ORDER — HEPARIN SOD (PORK) LOCK FLUSH 100 UNIT/ML IV SOLN
500.0000 [IU] | Freq: Once | INTRAVENOUS | Status: AC
  Administered 2019-11-19: 500 [IU]
  Filled 2019-11-19: qty 5

## 2019-11-19 NOTE — Therapy (Addendum)
[place], [place], [address] Phone: [phone]   Fax:  [phone]  Physical Therapy Treatment  Patient Details  Name: Melissa [name] MRN: [id number] Date of Birth: Dec 15, 1990 Referring Provider (PT): Dr. [name]   Encounter Date: 11/19/2019   PT End of Session - 11/19/19 1253    Visit Number 2    Number of Visits 13    Date for PT Re-Evaluation 12/10/19    Authorization Type Medicaid [place] - Visit Number 2    Authorization - Number of Visits 4    PT Start Time 8242    PT Stop Time 1143    PT Time Calculation (min) 40 min    Activity Tolerance Patient tolerated treatment well;Treatment limited secondary to medical complications (Comment)    Behavior During Therapy [facility] for tasks assessed/performed           Past Medical History:  Diagnosis Date  . Abnormal Pap smear 07/13/11   ASCUS +HPV  . Anemia   . Cancer [facility])    breast cancer  . CHF (congestive heart failure) ([place])   . Family history of breast cancer   . Family history of colon cancer   . Family history of throat cancer   . Fracture of distal phalanx of finger 10/05/2019   Right thumb and middle finger  . Herpes simplex without mention of complication    dx age 29    Past Surgical History:  Procedure Laterality Date  . BREAST BIOPSY Left 03/18/2019   Procedure: LEFT BREAST SKIN PUNCH BIOPSY;  Surgeon: Rolm Bookbinder, MD;  Location: [place];  Service: General;  Laterality: Left;  . COLPOSCOPY     CIN I ECC -  . MASTECTOMY Bilateral 10/08/2019   BILATERAL MASTECTOMY WITH LEFT AXILLARY SENTINEL LYMPH NODE BIOPSY (Bilateral Breast)   . MASTECTOMY W/ SENTINEL NODE BIOPSY Bilateral 10/08/2019   Procedure: BILATERAL MASTECTOMY WITH LEFT AXILLARY SENTINEL LYMPH NODE BIOPSY;  Surgeon: Rolm Bookbinder, MD;  Location: [place];  Service: General;  Laterality: Bilateral;  BILATERAL PEC BLOCK  . NO PAST SURGERIES    .  PORTACATH PLACEMENT N/A 03/18/2019   Procedure: INSERTION PORT-A-CATH WITH ULTRASOUND GUIDANCE;  Surgeon: Rolm Bookbinder, MD;  Location: [place];  Service: General;  Laterality: N/A;  . RADIOACTIVE SEED GUIDED AXILLARY SENTINEL LYMPH NODE Left 10/08/2019   Procedure: LEFT RADIOACTIVE SEED GUIDED AXILLARY SENTINEL LYMPH NODE EXCISION;  Surgeon: Rolm Bookbinder, MD;  Location: [place];  Service: General;  Laterality: Left;  PEC BLOCK    There were no vitals filed for this visit.   Subjective Assessment - 11/19/19 1057    Subjective They are delaying the radiation to start 11/27/19 due to wound healing.  I was able to do my radiation simulation.  For the most part the pain has gone away it is just uncomfortable under the arm.    Pertinent History Bil breast cancer with completion of neoadjuvant chemotherapy with adriamycin and cytoxan and then taxol, bilateral mastectomy 10/08/19 with Dr. [name] with 0/4 nodes on the Rt and 1/4 nodes on the Lt with IDC findings bilaterally.  ER/PR positive.  Radiation starting soon just the Lt side  Reconstruction possible.    Patient Stated Goals get the information I need    Currently in Pain? No/denies              [facility] PT Assessment - 11/19/19 0001      Observation/Other Assessments   Observations  Rt mastectomy incision more healed with steristrips present and more extra skin vs edema. Lt side still open across the chest horizontally with some inferior pulling open of the wound with AROM into flexion.  Some discharge still present on guaze.       ROM / Strength   AROM / PROM / Strength AROM      AROM   AROM Assessment Site Shoulder    Right/Left Shoulder Right;Left    Right Shoulder Extension 45 Degrees    Right Shoulder Flexion 150 Degrees    Right Shoulder ABduction 152 Degrees    Right Shoulder Internal Rotation 65 Degrees    Right Shoulder External Rotation 85 Degrees    Left Shoulder Extension 43 Degrees    Left Shoulder Flexion 135  Degrees    Left Shoulder ABduction 154 Degrees    Left Shoulder Internal Rotation 70 Degrees    Left Shoulder External Rotation 90 Degrees                         [facility] Adult PT Treatment/Exercise - 11/19/19 0001      Exercises   Exercises Other Exercises    Other Exercises  Pt instructed in HEP to begin with focus on monitoring incision on the left for no pulling or inferior region opening in front of the mirror.  Pt demonstrated only minimal pull with the Rt UE during all.  Postural focus and education with scapular retractions and bil ER with scapular retraction.  Begin dowel flexion and abduction as well as wall slides monitoring Lt incision.  Pt demonstrated good performance of each and was given a handout.                    PT Education - 11/19/19 1253    Education Details new HEP    Person(s) Educated Patient    Methods Explanation;Demonstration;Tactile cues;Verbal cues;Handout    Comprehension Verbalized understanding;Returned demonstration;Verbal cues required               PT Long Term Goals - 10/29/19 1546      PT LONG TERM GOAL #1   Title Pt will be educated on lymphedema surveillance and risk reduction    Time 8    Period Weeks    Status Achieved      PT LONG TERM GOAL #2   Title Pt will demonstrate WNL shoulder ROM bilaterally with no sig pain or pulling    Time 8    Period Weeks    Status New      PT LONG TERM GOAL #3   Title Pt will be ind with strength ABC program for continued strength and mobility    Time 8    Period Weeks    Status New      PT LONG TERM GOAL #4   Title Pt will decrease QDASH to 12% or less    Baseline 35%    Time 8    Period Weeks    Status New                 Plan - 11/19/19 1254    Clinical Impression Statement Pt returns to PT with chest incisions much more healed.  Now not actively draining and with pink healthy skin still healing on the left side and steristrips present on the Rt.  Pt was  given HEP to begin monitoring healing on the Lt side and pt is aware of this.  Discussed postural importance and will check back in in 3 weeks.    PT Frequency 2x / week    PT Duration 6 weeks    PT Treatment/Interventions ADLs/Self Care Home Management;Therapeutic exercise;Patient/family education;Passive range of motion;Splinting;Manual lymph drainage;Manual techniques    PT Next Visit Plan skin/healing check, measure AROM, assess any stretches/HEP or futher treatment needed    PT Home Exercise Plan Access Code: [email or web address]; scap ret, bil ER, dowel flexion and abduction, wall flexion    Consulted and Agree with Plan of Care Patient           Patient will benefit from skilled therapeutic intervention in order to improve the following deficits and impairments:  Decreased mobility, Decreased scar mobility, Postural dysfunction, Decreased activity tolerance, Decreased range of motion, Impaired UE functional use  Visit Diagnosis: Aftercare following surgery for neoplasm  Stiffness of left shoulder, not elsewhere classified  Stiffness of right shoulder, not elsewhere classified     Problem List Patient Active Problem List   Diagnosis Date Noted  . Breast cancer, left breast ([place]) 10/08/2019  . Genetic testing 04/15/2019  . Port-A-Cath in place 03/25/2019  . Family history of breast cancer   . Family history of colon cancer   . Family history of throat cancer   . Malignant neoplasm of overlapping sites of left breast in female, estrogen receptor positive ([place]) 03/04/2019  . PCOS (polycystic ovarian syndrome) 08/22/2012  . Anovulation 07/09/2012  . ASCUS (atypical squamous cells of undetermined significance) on Pap smear 12/04/2010    Stark Bray 11/19/2019, 12:56 PM  [place], [place], [address] Phone: [phone]   Fax:  [phone]  Name: Monita Swier MRN: [id number] Date of Birth:  12/01/1990  PHYSICAL THERAPY DISCHARGE SUMMARY  Visits from Start of Care: 2  Current functional level related to goals / functional outcomes: Pt did not return after last visit.  Per chart review has completed radiation and is healing well.     Remaining deficits: unknown   Education / Equipment: Stretches post op Plan: Patient agrees to discharge.  Patient goals were partially met. Patient is being discharged due to not returning since the last visit.  ?????     Shan Levans, PT

## 2019-11-19 NOTE — Patient Instructions (Signed)

## 2019-11-19 NOTE — Patient Instructions (Signed)
Access Code: [email or web address] URL: [email or web address]: 09/09/2021Prepared by: Marcene Brawn TevisExercises  Seated Scapular Retraction - 3 x daily - 7 x weekly - 1 sets - 10 reps - 2-3 second hold  Shoulder External Rotation and Scapular Retraction - 1-2 x daily - 7 x weekly - 10 reps - 10 seconds hold  Shoulder Flexion Overhead with Dowel - 1 x daily - 7 x weekly - 1 sets - 10 reps - 2-3 second hold  Standing shoulder flexion wall slides - 1 x daily - 7 x weekly - 1 sets - 2 reps - 10 seconds hold

## 2019-11-20 ENCOUNTER — Ambulatory Visit: Payer: Medicaid Other

## 2019-11-23 ENCOUNTER — Ambulatory Visit: Payer: Medicaid Other

## 2019-11-23 DIAGNOSIS — Z170 Estrogen receptor positive status [ER+]: Secondary | ICD-10-CM | POA: Insufficient documentation

## 2019-11-23 DIAGNOSIS — Z3202 Encounter for pregnancy test, result negative: Secondary | ICD-10-CM | POA: Insufficient documentation

## 2019-11-23 DIAGNOSIS — C50812 Malignant neoplasm of overlapping sites of left female breast: Secondary | ICD-10-CM | POA: Insufficient documentation

## 2019-11-24 ENCOUNTER — Ambulatory Visit: Payer: Medicaid Other

## 2019-11-25 ENCOUNTER — Ambulatory Visit: Payer: Medicaid Other

## 2019-11-26 ENCOUNTER — Ambulatory Visit: Payer: Medicaid Other

## 2019-11-27 ENCOUNTER — Other Ambulatory Visit: Payer: Self-pay

## 2019-11-27 ENCOUNTER — Ambulatory Visit: Admission: RE | Admit: 2019-11-27 | Payer: Medicaid Other | Source: Ambulatory Visit | Admitting: Radiation Oncology

## 2019-11-27 ENCOUNTER — Ambulatory Visit: Payer: Medicaid Other

## 2019-11-27 DIAGNOSIS — C50812 Malignant neoplasm of overlapping sites of left female breast: Secondary | ICD-10-CM | POA: Diagnosis not present

## 2019-11-30 ENCOUNTER — Other Ambulatory Visit: Payer: Self-pay | Admitting: *Deleted

## 2019-11-30 ENCOUNTER — Other Ambulatory Visit: Payer: Self-pay

## 2019-11-30 ENCOUNTER — Ambulatory Visit
Admission: RE | Admit: 2019-11-30 | Discharge: 2019-11-30 | Disposition: A | Discharge status: Home / Self Care | Payer: Medicaid Other | Source: Ambulatory Visit | Attending: Radiation Oncology | Admitting: Radiation Oncology

## 2019-11-30 ENCOUNTER — Ambulatory Visit: Payer: Medicaid Other | Admitting: Radiation Oncology

## 2019-11-30 DIAGNOSIS — Z170 Estrogen receptor positive status [ER+]: Secondary | ICD-10-CM

## 2019-11-30 DIAGNOSIS — C50812 Malignant neoplasm of overlapping sites of left female breast: Secondary | ICD-10-CM

## 2019-11-30 MED ORDER — SONAFINE EX EMUL
1.0000 "application " | Freq: Two times a day (BID) | CUTANEOUS | Status: DC
  Administered 2019-11-30: 1 via TOPICAL

## 2019-11-30 MED ORDER — ALRA NON-METALLIC DEODORANT (RAD-ONC)
1.0000 "application " | Freq: Once | TOPICAL | Status: AC
  Administered 2019-11-30: 1 via TOPICAL

## 2019-11-30 NOTE — Progress Notes (Signed)

## 2019-12-01 ENCOUNTER — Encounter: Payer: Self-pay | Admitting: Radiation Oncology

## 2019-12-01 ENCOUNTER — Ambulatory Visit
Admission: RE | Admit: 2019-12-01 | Discharge: 2019-12-01 | Disposition: A | Discharge status: Home / Self Care | Payer: Medicaid Other | Source: Ambulatory Visit | Attending: Radiation Oncology | Admitting: Radiation Oncology

## 2019-12-01 ENCOUNTER — Ambulatory Visit: Payer: Medicaid Other | Admitting: Radiation Oncology

## 2019-12-01 ENCOUNTER — Ambulatory Visit: Payer: Medicaid Other

## 2019-12-01 ENCOUNTER — Other Ambulatory Visit: Payer: Self-pay

## 2019-12-01 ENCOUNTER — Telehealth: Payer: Self-pay | Admitting: Licensed Clinical Social Worker

## 2019-12-01 DIAGNOSIS — C50812 Malignant neoplasm of overlapping sites of left female breast: Secondary | ICD-10-CM | POA: Diagnosis not present

## 2019-12-01 NOTE — Progress Notes (Signed)
Patient called to inquire about gas cards for Radiation. Patient has used all of her [employer] account executive. Sent staff message to Sharyn Lull in social work regarding any remaining [employer].  Patient had a question about daycare that I assisted her with to add the baby. I advised her to contact DSS directly to obtain the contact information for her specific worker. She verbalized understanding.   Patient has my card for any additional financial questions or concerns.

## 2019-12-01 NOTE — Telephone Encounter (Signed)
[place] Clinical Social Work  CSW attempted to contact patient per request from [employer] [place]. Patient has one more installment of [employer] cards that she can use to help with gas. She has already utilized her [employer] and has received assistance from [employer].   Patient may pick up cards at any of her next radiation appts.   Edwinna Areola Athira Janowicz, LCSW

## 2019-12-02 ENCOUNTER — Other Ambulatory Visit: Payer: Self-pay

## 2019-12-02 ENCOUNTER — Ambulatory Visit
Admission: RE | Admit: 2019-12-02 | Discharge: 2019-12-02 | Disposition: A | Discharge status: Home / Self Care | Payer: Medicaid Other | Source: Ambulatory Visit | Attending: Radiation Oncology | Admitting: Radiation Oncology

## 2019-12-02 ENCOUNTER — Encounter (HOSPITAL_COMMUNITY): Payer: Medicaid Other | Admitting: Internal Medicine

## 2019-12-02 DIAGNOSIS — C50812 Malignant neoplasm of overlapping sites of left female breast: Secondary | ICD-10-CM | POA: Diagnosis not present

## 2019-12-03 ENCOUNTER — Ambulatory Visit
Admission: RE | Admit: 2019-12-03 | Discharge: 2019-12-03 | Disposition: A | Discharge status: Home / Self Care | Payer: Medicaid Other | Source: Ambulatory Visit | Attending: Radiation Oncology | Admitting: Radiation Oncology

## 2019-12-03 DIAGNOSIS — C50812 Malignant neoplasm of overlapping sites of left female breast: Secondary | ICD-10-CM | POA: Diagnosis not present

## 2019-12-04 ENCOUNTER — Ambulatory Visit
Admission: RE | Admit: 2019-12-04 | Discharge: 2019-12-04 | Disposition: A | Discharge status: Home / Self Care | Payer: Medicaid Other | Source: Ambulatory Visit | Attending: Radiation Oncology | Admitting: Radiation Oncology

## 2019-12-04 DIAGNOSIS — C50812 Malignant neoplasm of overlapping sites of left female breast: Secondary | ICD-10-CM | POA: Diagnosis not present

## 2019-12-07 ENCOUNTER — Ambulatory Visit
Admission: RE | Admit: 2019-12-07 | Discharge: 2019-12-07 | Disposition: A | Discharge status: Home / Self Care | Payer: Medicaid Other | Source: Ambulatory Visit | Attending: Radiation Oncology | Admitting: Radiation Oncology

## 2019-12-07 ENCOUNTER — Other Ambulatory Visit: Payer: Self-pay

## 2019-12-07 DIAGNOSIS — C50812 Malignant neoplasm of overlapping sites of left female breast: Secondary | ICD-10-CM | POA: Diagnosis not present

## 2019-12-08 ENCOUNTER — Telehealth: Payer: Self-pay | Admitting: Rehabilitation

## 2019-12-08 ENCOUNTER — Ambulatory Visit
Admission: RE | Admit: 2019-12-08 | Discharge: 2019-12-08 | Disposition: A | Discharge status: Home / Self Care | Payer: Medicaid Other | Source: Ambulatory Visit | Attending: Radiation Oncology | Admitting: Radiation Oncology

## 2019-12-08 ENCOUNTER — Encounter: Payer: Self-pay | Admitting: Rehabilitation

## 2019-12-08 DIAGNOSIS — C50812 Malignant neoplasm of overlapping sites of left female breast: Secondary | ICD-10-CM | POA: Diagnosis not present

## 2019-12-08 NOTE — Telephone Encounter (Signed)
Called pt regarding missed PT appointment to see if any more appointments are needed vs ready for discharge.  LM with patient to return call.

## 2019-12-09 ENCOUNTER — Ambulatory Visit
Admission: RE | Admit: 2019-12-09 | Discharge: 2019-12-09 | Disposition: A | Discharge status: Home / Self Care | Payer: Medicaid Other | Source: Ambulatory Visit | Attending: Radiation Oncology | Admitting: Radiation Oncology

## 2019-12-09 DIAGNOSIS — C50812 Malignant neoplasm of overlapping sites of left female breast: Secondary | ICD-10-CM | POA: Diagnosis not present

## 2019-12-10 ENCOUNTER — Ambulatory Visit
Admission: RE | Admit: 2019-12-10 | Discharge: 2019-12-10 | Disposition: A | Discharge status: Home / Self Care | Payer: Medicaid Other | Source: Ambulatory Visit | Attending: Radiation Oncology | Admitting: Radiation Oncology

## 2019-12-10 DIAGNOSIS — C50812 Malignant neoplasm of overlapping sites of left female breast: Secondary | ICD-10-CM | POA: Diagnosis not present

## 2019-12-11 ENCOUNTER — Ambulatory Visit
Admission: RE | Admit: 2019-12-11 | Discharge: 2019-12-11 | Disposition: A | Discharge status: Home / Self Care | Payer: Medicaid Other | Source: Ambulatory Visit | Attending: Radiation Oncology | Admitting: Radiation Oncology

## 2019-12-11 DIAGNOSIS — Z170 Estrogen receptor positive status [ER+]: Secondary | ICD-10-CM | POA: Diagnosis present

## 2019-12-11 DIAGNOSIS — C50812 Malignant neoplasm of overlapping sites of left female breast: Secondary | ICD-10-CM | POA: Insufficient documentation

## 2019-12-14 ENCOUNTER — Ambulatory Visit
Admission: RE | Admit: 2019-12-14 | Discharge: 2019-12-14 | Disposition: A | Discharge status: Home / Self Care | Payer: Medicaid Other | Source: Ambulatory Visit | Attending: Radiation Oncology | Admitting: Radiation Oncology

## 2019-12-14 ENCOUNTER — Other Ambulatory Visit: Payer: Self-pay

## 2019-12-14 DIAGNOSIS — C50812 Malignant neoplasm of overlapping sites of left female breast: Secondary | ICD-10-CM | POA: Diagnosis not present

## 2019-12-15 ENCOUNTER — Ambulatory Visit
Admission: RE | Admit: 2019-12-15 | Discharge: 2019-12-15 | Disposition: A | Discharge status: Home / Self Care | Payer: Medicaid Other | Source: Ambulatory Visit | Attending: Radiation Oncology | Admitting: Radiation Oncology

## 2019-12-15 ENCOUNTER — Telehealth: Payer: Self-pay | Admitting: Radiation Oncology

## 2019-12-15 DIAGNOSIS — C50812 Malignant neoplasm of overlapping sites of left female breast: Secondary | ICD-10-CM | POA: Diagnosis not present

## 2019-12-15 NOTE — Telephone Encounter (Signed)
Patient called asking if she could get her nails done while being under treatment in radiation. I've asked Dr. Pearlie Oyster nurse, Egbert Garibaldi, and she has responded: She is fine to get her nails done from a radiation standpoint. I double checked with her medical oncologist as well (Dr. Lindi Adie) and he said he is comfortable with her getting her nails done as well since she's done with chemo. I tried calling patient back, but received a vcml. I've asked her to call us back.

## 2019-12-16 ENCOUNTER — Ambulatory Visit
Admission: RE | Admit: 2019-12-16 | Discharge: 2019-12-16 | Disposition: A | Discharge status: Home / Self Care | Payer: Medicaid Other | Source: Ambulatory Visit | Attending: Radiation Oncology | Admitting: Radiation Oncology

## 2019-12-16 DIAGNOSIS — C50812 Malignant neoplasm of overlapping sites of left female breast: Secondary | ICD-10-CM | POA: Diagnosis not present

## 2019-12-17 ENCOUNTER — Ambulatory Visit
Admission: RE | Admit: 2019-12-17 | Discharge: 2019-12-17 | Disposition: A | Discharge status: Home / Self Care | Payer: Medicaid Other | Source: Ambulatory Visit | Attending: Radiation Oncology | Admitting: Radiation Oncology

## 2019-12-17 DIAGNOSIS — C50812 Malignant neoplasm of overlapping sites of left female breast: Secondary | ICD-10-CM | POA: Diagnosis not present

## 2019-12-18 ENCOUNTER — Ambulatory Visit
Admission: RE | Admit: 2019-12-18 | Discharge: 2019-12-18 | Disposition: A | Discharge status: Home / Self Care | Payer: Medicaid Other | Source: Ambulatory Visit | Attending: Radiation Oncology | Admitting: Radiation Oncology

## 2019-12-18 DIAGNOSIS — C50812 Malignant neoplasm of overlapping sites of left female breast: Secondary | ICD-10-CM | POA: Diagnosis not present

## 2019-12-21 ENCOUNTER — Ambulatory Visit: Payer: Medicaid Other | Admitting: Radiation Oncology

## 2019-12-21 ENCOUNTER — Ambulatory Visit
Admission: RE | Admit: 2019-12-21 | Discharge: 2019-12-21 | Disposition: A | Discharge status: Home / Self Care | Payer: Medicaid Other | Source: Ambulatory Visit | Attending: Radiation Oncology | Admitting: Radiation Oncology

## 2019-12-21 ENCOUNTER — Ambulatory Visit: Payer: Medicaid Other

## 2019-12-21 DIAGNOSIS — C50812 Malignant neoplasm of overlapping sites of left female breast: Secondary | ICD-10-CM | POA: Diagnosis not present

## 2019-12-22 ENCOUNTER — Ambulatory Visit
Admission: RE | Admit: 2019-12-22 | Discharge: 2019-12-22 | Disposition: A | Discharge status: Home / Self Care | Payer: Medicaid Other | Source: Ambulatory Visit | Attending: Radiation Oncology | Admitting: Radiation Oncology

## 2019-12-22 ENCOUNTER — Ambulatory Visit: Payer: Medicaid Other

## 2019-12-22 DIAGNOSIS — C50812 Malignant neoplasm of overlapping sites of left female breast: Secondary | ICD-10-CM | POA: Diagnosis not present

## 2019-12-23 ENCOUNTER — Ambulatory Visit: Payer: Medicaid Other

## 2019-12-23 ENCOUNTER — Ambulatory Visit
Admission: RE | Admit: 2019-12-23 | Discharge: 2019-12-23 | Disposition: A | Discharge status: Home / Self Care | Payer: Medicaid Other | Source: Ambulatory Visit | Attending: Radiation Oncology | Admitting: Radiation Oncology

## 2019-12-23 DIAGNOSIS — C50812 Malignant neoplasm of overlapping sites of left female breast: Secondary | ICD-10-CM | POA: Diagnosis not present

## 2019-12-24 ENCOUNTER — Ambulatory Visit
Admission: RE | Admit: 2019-12-24 | Discharge: 2019-12-24 | Disposition: A | Discharge status: Home / Self Care | Payer: Medicaid Other | Source: Ambulatory Visit | Attending: Radiation Oncology | Admitting: Radiation Oncology

## 2019-12-24 ENCOUNTER — Ambulatory Visit: Payer: Medicaid Other

## 2019-12-24 DIAGNOSIS — C50812 Malignant neoplasm of overlapping sites of left female breast: Secondary | ICD-10-CM | POA: Diagnosis not present

## 2019-12-25 ENCOUNTER — Ambulatory Visit
Admission: RE | Admit: 2019-12-25 | Discharge: 2019-12-25 | Disposition: A | Discharge status: Home / Self Care | Payer: Medicaid Other | Source: Ambulatory Visit | Attending: Radiation Oncology | Admitting: Radiation Oncology

## 2019-12-25 ENCOUNTER — Ambulatory Visit: Payer: Medicaid Other

## 2019-12-25 DIAGNOSIS — C50812 Malignant neoplasm of overlapping sites of left female breast: Secondary | ICD-10-CM | POA: Diagnosis not present

## 2019-12-28 ENCOUNTER — Ambulatory Visit
Admission: RE | Admit: 2019-12-28 | Discharge: 2019-12-28 | Disposition: A | Discharge status: Home / Self Care | Payer: Medicaid Other | Source: Ambulatory Visit | Attending: Radiation Oncology | Admitting: Radiation Oncology

## 2019-12-28 DIAGNOSIS — C50812 Malignant neoplasm of overlapping sites of left female breast: Secondary | ICD-10-CM | POA: Diagnosis not present

## 2019-12-29 ENCOUNTER — Ambulatory Visit
Admission: RE | Admit: 2019-12-29 | Discharge: 2019-12-29 | Disposition: A | Discharge status: Home / Self Care | Payer: Medicaid Other | Source: Ambulatory Visit | Attending: Radiation Oncology | Admitting: Radiation Oncology

## 2019-12-29 DIAGNOSIS — C50812 Malignant neoplasm of overlapping sites of left female breast: Secondary | ICD-10-CM | POA: Diagnosis not present

## 2019-12-30 ENCOUNTER — Ambulatory Visit
Admission: RE | Admit: 2019-12-30 | Discharge: 2019-12-30 | Disposition: A | Discharge status: Home / Self Care | Payer: Medicaid Other | Source: Ambulatory Visit | Attending: Radiation Oncology | Admitting: Radiation Oncology

## 2019-12-30 DIAGNOSIS — C50812 Malignant neoplasm of overlapping sites of left female breast: Secondary | ICD-10-CM | POA: Diagnosis not present

## 2019-12-30 NOTE — Progress Notes (Signed)
Patient Care Team: [name], NP as PCP - General (Nurse Practitioner) [name], RN as Oncology Nurse Navigator Rockwell Germany, RN as Oncology Nurse Navigator Rolm Bookbinder, MD as Surgeon (General Surgery) Nicholas Lose, MD as Medical Oncologist (Hematology and Oncology)  DIAGNOSIS:    ICD-10-CM   1. Malignant neoplasm of overlapping sites of left breast in female, estrogen receptor positive ([place])  C50.812    Z17.0     SUMMARY OF ONCOLOGIC HISTORY: Oncology History  Malignant neoplasm of overlapping sites of left breast in female, estrogen receptor positive ([place])  02/26/2019 Initial Diagnosis   Pregnant lady with 2-month history of left breast swelling and skin thickening, mammogram showed pleomorphic calcifications 8 cm, axilla lymph node biopsy positive ER 60%, PR 60%, Ki-67 50%, HER-2 negative ratio 1.25, anterior and posterior end of the calcifications biopsied: 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 ER 50%, PR 30%, Ki-67 50%, HER-2 negative ratio 1.03   03/04/2019 Cancer Staging   Staging form: Breast, AJCC 8th Edition - Clinical stage from 03/04/2019: Stage IIIB (cT4, cN1, cM0, G3, ER+, PR+, HER2-) - Signed by Nicholas Lose, MD on 03/04/2019    Neo-Adjuvant Chemotherapy   Adriamycin and Cytoxan x 4 given every three weeks without Neulasta support., followed by weekly Taxol x 12 versus Taxotere Cytoxan after her delivery   04/15/2019 Genetic Testing   Negative genetic testing:  No pathogenic variants detected on the [employer] Breast Cancer Guidelines-Based Panel or the Common Hereditary Cancers Panel. The report date is 04/15/2019.  The Breast Cancer Guidelines-Based panel offered by [employer] includes sequencing and rearrangement analysis for the following 11 genes:  ATM, BRCA1, BRCA2, CDH1, CHEK2, NBN, NF1, PALB2, PTEN, STK11 and TP53.  The Common Hereditary Cancers Panel offered by [employer] includes sequencing and/or deletion duplication testing of the  following 48 genes: APC, ATM, AXIN2, BARD1, BMPR1A, BRCA1, BRCA2, BRIP1, CDH1, CDK4, CDKN2A (p14ARF), CDKN2A (p16INK4a), CHEK2, CTNNA1, DICER1, EPCAM (Deletion/duplication testing only), GREM1 (promoter region deletion/duplication testing only), KIT, MEN1, MLH1, MSH2, MSH3, MSH6, MUTYH, NBN, NF1, NHTL1, PALB2, PDGFRA, PMS2, POLD1, POLE, PTEN, RAD50, RAD51C, RAD51D, RNF43, SDHB, SDHC, SDHD, SMAD4, SMARCA4. STK11, TP53, TSC1, TSC2, and VHL.  The following genes were evaluated for sequence changes only: SDHA and HOXB13 c.251G>A variant only.    10/08/2019 Surgery   Bilateral mastectomies [name]):  Right mastectomy: Multifocal grade 2 IDC 0.8 cm and 0.6 cm with high-grade DCIS, margins negative, 0/4 lymph nodes ER 95%, PR 5%, Ki-67 10%, HER-2 negative;  Left mastectomy: IDC 0.6 cm, LVI, margins negative, 1/4 lymph nodes positive, ER 60%, PR 60%, HER-2 negative, Ki-67 50% RCB class II   11/30/2019 -  Radiation Therapy   Adjuvant radiation     CHIEF COMPLIANT: Follow-up to discuss antiestrogen therapy  INTERVAL HISTORY: Melissa [name] is a 29 y.o. with above-mentioned history of left breast cancerwho completedneoadjuvant chemotherapy, underwent bilateral mastectomies, and is currently undergoing radiation. She presents to the clinic todayto discuss antiestrogen therapy.   ALLERGIES:  has No Known Allergies.  MEDICATIONS:  Current Outpatient Medications  Medication Sig Dispense Refill  . carvedilol (COREG) 3.125 MG tablet Take 1 tablet (3.125 mg total) by mouth 2 (two) times daily. 60 tablet 11  . dapagliflozin propanediol (FARXIGA) 10 MG TABS tablet Take 1 tablet (10 mg total) by mouth daily before breakfast. 30 tablet 11  . furosemide (LASIX) 20 MG tablet Take 1 tablet (20 mg total) by mouth daily as needed for fluid or edema. 90 tablet 2  .  gabapentin (NEURONTIN) 100 MG capsule Take 1 capsule (100 mg total) by mouth at bedtime. 30 capsule 0  . lidocaine-prilocaine (EMLA) cream APPLY 1  APPLICATION TOPICALLY ONCE FOR 1 DOSE.    . methocarbamol (ROBAXIN) 500 MG tablet Take 1 tablet (500 mg total) by mouth 3 (three) times daily. 30 tablet 1  . oxyCODONE (OXY IR/ROXICODONE) 5 MG immediate release tablet Take 1 tablet (5 mg total) by mouth every 4 (four) hours as needed for moderate pain. 15 tablet 0  . sacubitril-valsartan (ENTRESTO) 97-103 MG Take 1 tablet by mouth 2 (two) times daily. 60 tablet 11  . spironolactone (ALDACTONE) 25 MG tablet Take 1 tablet (25 mg total) by mouth daily. 90 tablet 3   No current facility-administered medications for this visit.    PHYSICAL EXAMINATION: ECOG PERFORMANCE STATUS: 1 - Symptomatic but completely ambulatory  Vitals:   12/31/19 1156  BP: 114/80  Pulse: 70  Resp: 20  Temp: 97.6 F (36.4 C)  SpO2: 100%   Filed Weights   12/31/19 1156  Weight: 177 lb 12.8 oz (80.6 kg)    BREAST: No palpable masses or nodules in either right or left breasts. No palpable axillary supraclavicular or infraclavicular adenopathy no breast tenderness or nipple discharge. (exam performed in the presence of a chaperone)  LABORATORY DATA:  I have reviewed the data as listed CMP Latest Ref Rng & Units 10/08/2019 09/30/2019 09/18/2019  Glucose 70 - 99 mg/dL 94 100(H) 91  BUN 6 - 20 mg/dL $Remove'19 14 13  'arAgXmw$ Creatinine 0.44 - 1.00 mg/dL 0.90 0.84 0.79  Sodium 135 - 145 mmol/L 138 138 140  Potassium 3.5 - 5.1 mmol/L 3.9 4.5 4.0  Chloride 98 - 111 mmol/L 99 102 107  CO2 22 - 32 mmol/L - 28 24  Calcium 8.9 - 10.3 mg/dL - 9.3 9.5  Total Protein 6.5 - 8.1 g/dL - 7.3 6.6  Total Bilirubin 0.3 - 1.2 mg/dL - 0.6 0.4  Alkaline Phos 38 - 126 U/L - 59 56  AST 15 - 41 U/L - 22 18  ALT 0 - 44 U/L - 12 13    Lab Results  Component Value Date   WBC 4.9 09/30/2019   HGB 9.9 (L) 10/08/2019   HCT 29.0 (L) 10/08/2019   MCV 98.0 09/30/2019   PLT 283 09/30/2019   NEUTROABS 2.0 09/18/2019    ASSESSMENT & PLAN:  Malignant neoplasm of overlapping sites of left breast in  female, estrogen receptor positive ([place]) 02/26/2019:Pregnant lady with 33-month history of left breast swelling and skin thickening, mammogram showed pleomorphic calcifications 8 cm, axilla lymph node biopsy positive ER 60%, PR 60%, Ki-67 50%, HER-2 negative ratio 1.25, anterior and posterior end of the calcifications biopsied: 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 Neoadjuvant chemotherapy with dose dense Adriamycin and Cytoxan given every 3 weeks without Neulasta support given her pregnancycompleted 05/28/2019 2. Post-Partumweekly Taxol x12starting 07/03/2019 3. Bilateral mastectomy 10/08/2019: Right mastectomy: Multifocal grade 2 IDC 0.8 cm and 0.6 cm with high-grade DCIS, margins negative, 0/4 lymph nodes ER 95%, PR 5%, Ki-67 10%, HER-2 negative; left mastectomy: IDC 0.6 cm, LVI, margins negative, 1/4 lymph nodes positive, ER 60%, PR 60%, HER-2 negative, Ki-67 50% RCB class II 4. Adjuvant radiation therapy 5. Followed by adjuvant antiestrogen therapy with complete estrogen blockade (ovarian suppression with  AI) CT C/A/P on 5/13 shows sclerotic bone lesions, bone scan negative 6.  +/- CDK 4 and 6 inhibitor with antiestrogen therapy ----------------------------------------------------------------------------------------------------------------------------------------------------- Treatment plan:  1. Adjuvant radiation started 11/30/2019  2. Adjuvant antiestrogen therapy with complete estrogen blockade (versus Oophorectomy) plus abemaciclib (based on MONARCH-E trial)  Anastrozole counseling:We discussed the risks and benefits of anti-estrogen therapy with aromatase inhibitors. These include but not limited to insomnia, hot flashes, mood changes, vaginal dryness, bone density loss, and weight  gain. We strongly believe that the benefits far outweigh the risks. Patient understands these risks and consented to starting treatment. Planned treatment duration is 7 years.  Abemaciclib counseling: I discussed at length the risks and benefits of Abemaciclib in combination with letrozole. Adverse effects of Abemaciclib include decreasing neutrophil count, pneumonia, blood clots in lungs as well as nausea and GI symptoms. Side effects of letrozole include hot flashes, muscle aches and pains, uterine bleeding/spotting/cancer, osteoporosis, risk of blood clots.  Return to clinic in 2 weeks to discuss the final treatment plan.  No orders of the defined types were placed in this encounter.  The patient has a good understanding of the overall plan. she agrees with it. she will call with any problems that may develop before the next visit here.  Total time spent: 20 mins including face to face time and time spent for planning, charting and coordination of care  Nicholas Lose, MD 12/31/2019  I, Cloyde Reams Dorshimer, am acting as scribe for Dr. Nicholas Lose.  I have reviewed the above documentation for accuracy and completeness, and I agree with the above.

## 2019-12-31 ENCOUNTER — Inpatient Hospital Stay: Payer: Medicaid Other | Attending: Hematology and Oncology | Admitting: Hematology and Oncology

## 2019-12-31 ENCOUNTER — Ambulatory Visit
Admission: RE | Admit: 2019-12-31 | Discharge: 2019-12-31 | Disposition: A | Discharge status: Home / Self Care | Payer: Medicaid Other | Source: Ambulatory Visit | Attending: Radiation Oncology | Admitting: Radiation Oncology

## 2019-12-31 ENCOUNTER — Other Ambulatory Visit: Payer: Self-pay

## 2019-12-31 DIAGNOSIS — Z7984 Long term (current) use of oral hypoglycemic drugs: Secondary | ICD-10-CM | POA: Diagnosis not present

## 2019-12-31 DIAGNOSIS — Z170 Estrogen receptor positive status [ER+]: Secondary | ICD-10-CM | POA: Diagnosis not present

## 2019-12-31 DIAGNOSIS — Z79899 Other long term (current) drug therapy: Secondary | ICD-10-CM | POA: Diagnosis not present

## 2019-12-31 DIAGNOSIS — Z9013 Acquired absence of bilateral breasts and nipples: Secondary | ICD-10-CM | POA: Insufficient documentation

## 2019-12-31 DIAGNOSIS — Z923 Personal history of irradiation: Secondary | ICD-10-CM | POA: Diagnosis not present

## 2019-12-31 DIAGNOSIS — C50812 Malignant neoplasm of overlapping sites of left female breast: Secondary | ICD-10-CM | POA: Diagnosis not present

## 2019-12-31 DIAGNOSIS — Z9221 Personal history of antineoplastic chemotherapy: Secondary | ICD-10-CM | POA: Diagnosis not present

## 2019-12-31 NOTE — Assessment & Plan Note (Signed)
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 Neoadjuvant chemotherapy with dose dense Adriamycin and Cytoxan given every 3 weeks without Neulasta support given her pregnancycompleted 05/28/2019 2. Post-Partumweekly Taxol x12starting 07/03/2019 3. Bilateral mastectomy 10/08/2019: Right mastectomy: Multifocal grade 2 IDC 0.8 cm and 0.6 cm with high-grade DCIS, margins negative, 0/4 lymph nodes ER 95%, PR 5%, Ki-67 10%, HER-2 negative; left mastectomy: IDC 0.6 cm, LVI, margins negative, 1/4 lymph nodes positive, ER 60%, PR 60%, HER-2 negative, Ki-67 50% RCB class II 4. Adjuvant radiation therapy 5. Followed by adjuvant antiestrogen therapy with complete estrogen blockade (ovarian suppression with AI) CT C/A/P on 5/13 shows sclerotic bone lesions, bone scan negative 6.  +/- CDK 4 and 6 inhibitor with antiestrogen therapy ----------------------------------------------------------------------------------------------------------------------------------------------------- Treatment plan:  1. Adjuvant radiation started 11/30/2019  2. Adjuvant antiestrogen therapy with complete estrogen blockade (versus Oophorectomy) plus abemaciclib (based on MONARCH-E trial)  Anastrozole counseling:We discussed the risks and benefits of anti-estrogen therapy with aromatase inhibitors. These include but not limited to insomnia, hot flashes, mood changes, vaginal dryness, bone density loss, and weight gain. We strongly believe that the  benefits far outweigh the risks. Patient understands these risks and consented to starting treatment. Planned treatment duration is 7 years.  Abemaciclib counseling: I discussed at length the risks and benefits of Abemaciclib in combination with letrozole. Adverse effects of Abemaciclib include decreasing neutrophil count, pneumonia, blood clots in lungs as well as nausea and GI symptoms. Side effects of letrozole include hot flashes, muscle aches and pains, uterine bleeding/spotting/cancer, osteoporosis, risk of blood clots.  Return to clinic monthly for Zoladex injections

## 2020-01-01 ENCOUNTER — Ambulatory Visit
Admission: RE | Admit: 2020-01-01 | Discharge: 2020-01-01 | Disposition: A | Discharge status: Home / Self Care | Payer: Medicaid Other | Source: Ambulatory Visit | Attending: Radiation Oncology | Admitting: Radiation Oncology

## 2020-01-01 DIAGNOSIS — C50812 Malignant neoplasm of overlapping sites of left female breast: Secondary | ICD-10-CM | POA: Diagnosis not present

## 2020-01-04 ENCOUNTER — Ambulatory Visit
Admission: RE | Admit: 2020-01-04 | Discharge: 2020-01-04 | Disposition: A | Discharge status: Home / Self Care | Payer: Medicaid Other | Source: Ambulatory Visit | Attending: Radiation Oncology | Admitting: Radiation Oncology

## 2020-01-04 DIAGNOSIS — C50812 Malignant neoplasm of overlapping sites of left female breast: Secondary | ICD-10-CM | POA: Diagnosis not present

## 2020-01-04 MED ORDER — SONAFINE EX EMUL
1.0000 "application " | Freq: Two times a day (BID) | CUTANEOUS | Status: DC
  Administered 2020-01-04: 1 via TOPICAL

## 2020-01-05 ENCOUNTER — Ambulatory Visit
Admission: RE | Admit: 2020-01-05 | Discharge: 2020-01-05 | Disposition: A | Discharge status: Home / Self Care | Payer: Medicaid Other | Source: Ambulatory Visit | Attending: Radiation Oncology | Admitting: Radiation Oncology

## 2020-01-05 DIAGNOSIS — C50812 Malignant neoplasm of overlapping sites of left female breast: Secondary | ICD-10-CM | POA: Diagnosis not present

## 2020-01-06 ENCOUNTER — Ambulatory Visit
Admission: RE | Admit: 2020-01-06 | Discharge: 2020-01-06 | Disposition: A | Discharge status: Home / Self Care | Payer: Medicaid Other | Source: Ambulatory Visit | Attending: Radiation Oncology | Admitting: Radiation Oncology

## 2020-01-06 DIAGNOSIS — C50812 Malignant neoplasm of overlapping sites of left female breast: Secondary | ICD-10-CM | POA: Diagnosis not present

## 2020-01-07 ENCOUNTER — Encounter: Payer: Self-pay | Admitting: *Deleted

## 2020-01-07 ENCOUNTER — Encounter: Payer: Self-pay | Admitting: Licensed Clinical Social Worker

## 2020-01-07 ENCOUNTER — Ambulatory Visit
Admission: RE | Admit: 2020-01-07 | Discharge: 2020-01-07 | Disposition: A | Discharge status: Home / Self Care | Payer: Medicaid Other | Source: Ambulatory Visit | Attending: Radiation Oncology | Admitting: Radiation Oncology

## 2020-01-07 DIAGNOSIS — C50812 Malignant neoplasm of overlapping sites of left female breast: Secondary | ICD-10-CM | POA: Diagnosis not present

## 2020-01-07 NOTE — Progress Notes (Signed)
[facility] CSW Progress Note  Clinical Education officer, museum contacted patient by phone to provide coping and adjustment support. Patient has last radiation treatment tomorrow and is now deciding between different treatments post-radiation as discussed with Dr. Lindi Adie. Having anxiety around making the right choice and what to expect next. CSW provided empathic listening and normalized feelings. Discussed ways to increase her support by being clear on her needs with family. She is still caring for her two young (almost 31yo & 7 mo) daughters on her own.   CSW will continue to provide support with next call in 2 weeks. Patient also agreed to sign up for support group (name added to list) and is potentially interested in attending [facility] when it starts again.    Melissa [name] , LCSW

## 2020-01-08 ENCOUNTER — Ambulatory Visit
Admission: RE | Admit: 2020-01-08 | Discharge: 2020-01-08 | Disposition: A | Discharge status: Home / Self Care | Payer: Medicaid Other | Source: Ambulatory Visit | Attending: Radiation Oncology | Admitting: Radiation Oncology

## 2020-01-08 ENCOUNTER — Encounter: Payer: Self-pay | Admitting: Radiation Oncology

## 2020-01-08 DIAGNOSIS — C50812 Malignant neoplasm of overlapping sites of left female breast: Secondary | ICD-10-CM | POA: Diagnosis not present

## 2020-01-11 ENCOUNTER — Ambulatory Visit: Payer: Medicaid Other

## 2020-01-11 NOTE — Progress Notes (Addendum)
CARDIO-ONCOLOGY CLINIC NOTE  Referring Physician: Lindi Adie Primary Cardiologist: New  HPI:  Melissa [name] is 29 y.o. female with left breast cancer referred by Dr. Lindi Adie for enrollment into the Cardio-Oncology program due to nonischemic cardiomyopathy.   Malignant neoplasm of overlapping sites of left breast in female, estrogen receptor positive ([place])  02/26/2019 Initial Diagnosis   Pregnant lady with 38-monthhistory of left breast swelling and skin thickening, mammogram showed pleomorphic calcifications 8 cm, axilla lymph node biopsy positive ER 60%, PR 60%, Ki-67 50%, HER-2 negative ratio 1.25, anterior and posterior end of the calcifications biopsied: 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 ER 50%, PR 30%, Ki-67 50%, HER-2 negative ratio 1.03   03/04/2019 Cancer Staging   Staging form: Breast, AJCC 8th Edition - Clinical stage from 03/04/2019: Stage IIIB (cT4, cN1, cM0, G3, ER+, PR+, HER2-) - Signed by GNicholas Lose MD on 03/04/2019    Neo-Adjuvant Chemotherapy   Adriamycin and Cytoxan x 4 given every three weeks without Neulasta support., followed by weekly Taxol x 12 versus Taxotere Cytoxan after her delivery   04/15/2019 Genetic Testing   Negative genetic testing:  No pathogenic variants detected on the [employer] Breast Cancer Guidelines-Based Panel or the Common Hereditary Cancers Panel. The report date is 04/15/2019.  The BreastCancerGuidelines-Basedpanel offered by [facility] includes sequencing and rearrangement analysis for the following 11genes: ATM, BRCA1, BRCA2, CDH1, CHEK2,NBN, NF1,PALB2, PTEN, STK11 and TP53.The Common Hereditary Cancers Panel offered by [facility] includes sequencing and/or deletion duplication testing of the following 48 genes: APC, ATM, AXIN2, BARD1, BMPR1A, BRCA1, BRCA2, BRIP1, CDH1, CDK4, CDKN2A (p14ARF), CDKN2A (p16INK4a), CHEK2, CTNNA1, DICER1, EPCAM (Deletion/duplication testing only), GREM1 (promoter region deletion/duplication testing  only), KIT, MEN1, MLH1, MSH2, MSH3, MSH6, MUTYH, NBN, NF1, NHTL1, PALB2, PDGFRA, PMS2, POLD1, POLE, PTEN, RAD50, RAD51C, RAD51D, RNF43, SDHB, SDHC, SDHD, SMAD4, SMARCA4. STK11, TP53, TSC1, TSC2, and VHL.  The following genes were evaluated for sequence changes only: SDHA and HOXB13 c.251G>A variant only.      Echo 03/17/19: EF 60% RV normal   Diagnosed with left breast CA in 12/20 when she was 4 months pregnant with her second child. While pregnant she was treated with 4 cycles of neoadjuvant chemotherapy with dose dense Adriamycin and Cytoxan. She delivered her baby girl on 06/19/19. She did not have problems with HTN or pre-eclampsia.   She presented to the ED on 06/27/19 with acute HF. Echo on 07/02/19 showed a severely decreased ejection fraction, <20%. She was referred to the HF Clinic   Has been following with our PharmD and HF meds adjusted.   Finished radiation last Friday and off Taxol chemo; restless leg symptoms have resolved since stopping chemo. Feeling better, gets tired during the day caring for her 2 young children (217year old & 773month old). Goes to store without DOE. No orthopnea or PND. No dizziness or CP. Not currently taking Lasix. Will have port removed when she has breast surgery in April 2022.  Has appointment with Oncology tomorrow (01/14/20) to discuss more permanent birth control options (Depot shots versus bilateral oophorectomy).   Echo 7/21 EF 35-40%   Past Medical History:  Diagnosis Date  . Abnormal Pap smear 07/13/11   ASCUS +HPV  . Anemia   . Cancer ([facility]    breast cancer  . CHF (congestive heart failure) ([place]   . Family history of breast cancer   . Family history of colon cancer   . Family history of throat cancer   . Fracture of distal phalanx  of finger 10/05/2019   Right thumb and middle finger  . Herpes simplex without mention of complication    dx age 42    Current Outpatient Medications  Medication Sig Dispense Refill  . carvedilol (COREG) 6.25  MG tablet Take 1 tablet (6.25 mg total) by mouth 2 (two) times daily. 60 tablet 6  . dapagliflozin propanediol (FARXIGA) 10 MG TABS tablet Take 1 tablet (10 mg total) by mouth daily before breakfast. 30 tablet 11  . furosemide (LASIX) 20 MG tablet Take 1 tablet (20 mg total) by mouth daily as needed for fluid or edema. 90 tablet 2  . gabapentin (NEURONTIN) 100 MG capsule Take 1 capsule (100 mg total) by mouth at bedtime. 30 capsule 0  . methocarbamol (ROBAXIN) 500 MG tablet Take 1 tablet (500 mg total) by mouth 3 (three) times daily. 30 tablet 1  . sacubitril-valsartan (ENTRESTO) 97-103 MG Take 1 tablet by mouth 2 (two) times daily. 60 tablet 11  . spironolactone (ALDACTONE) 25 MG tablet Take 1 tablet (25 mg total) by mouth daily. 90 tablet 3   No current facility-administered medications for this encounter.    No Known Allergies    Social History   Socioeconomic History  . Marital status: Single    Spouse name: Not on file  . Number of children: Not on file  . Years of education: Not on file  . Highest education level: Not on file  Occupational History  . Not on file  Tobacco Use  . Smoking status: Former Smoker    Types: Cigarettes    Quit date: 02/27/2019    Years since quitting: 0.8  . Smokeless tobacco: Never Used  Vaping Use  . Vaping Use: Never used  Substance and Sexual Activity  . Alcohol use: Not Currently  . Drug use: Yes    Frequency: 2.0 times per week    Types: Marijuana    Comment: x1 weekly  . Sexual activity: Yes    Birth control/protection: None  Other Topics Concern  . Not on file  Social History Narrative  . Not on file   Social Determinants of Health   Financial Resource Strain:   . Difficulty of Paying Living Expenses: Not on file  Food Insecurity:   . Worried About Charity fundraiser in the Last Year: Not on file  . Ran Out of Food in the Last Year: Not on file  Transportation Needs:   . Lack of Transportation (Medical): Not on file  .  Lack of Transportation (Non-Medical): Not on file  Physical Activity:   . Days of Exercise per Week: Not on file  . Minutes of Exercise per Session: Not on file  Stress:   . Feeling of Stress : Not on file  Social Connections:   . Frequency of Communication with Friends and Family: Not on file  . Frequency of Social Gatherings with Friends and Family: Not on file  . Attends Religious Services: Not on file  . Active Member of Clubs or Organizations: Not on file  . Attends Archivist Meetings: Not on file  . Marital Status: Not on file  Intimate Partner Violence:   . Fear of Current or Ex-Partner: Not on file  . Emotionally Abused: Not on file  . Physically Abused: Not on file  . Sexually Abused: Not on file      Family History  Problem Relation Age of Onset  . Breast cancer Paternal Grandmother  dx. 78s or younger  . Stroke Father 84  . Hypertension Father   . Cancer Maternal Aunt        unknown type, dx. late 48s  . Colon cancer Paternal Uncle        dx 90s  . Throat cancer Maternal Grandmother        dx. 5s  . Cancer Paternal Uncle        unknown type, dx. 66s  . Breast cancer Cousin        dx. 99s, paternal 1st cousin    Vitals:   01/13/20 1105  BP: 128/88  Pulse: 66  SpO2: 100%  Weight: 80.1 kg (176 lb 9.6 oz)   PHYSICAL EXAM: General:  Well appearing. No resp difficulty HEENT: normal Neck: supple. no JVD. Carotids 2+ bilat; no bruits. No lymphadenopathy or thryomegaly appreciated. Cor: PMI nondisplaced. Regular rate & rhythm. No rubs, gallops or murmurs. Lungs: clear, port to right upper chest, not accessed Skin: radiation-associated darkening to left upper chest; skin intact Abdomen: soft, nontender, nondistended. No hepatosplenomegaly. No bruits or masses. Good bowel sounds. Extremities: no cyanosis, clubbing, rash, edema Neuro: alert & orientedx3, cranial nerves grossly intact. moves all 4 extremities w/o difficulty. Affect  pleasant    ASSESSMENT & PLAN: 1. Acute systolic HF - Echo 7/03 EF 60% (pre-chemo and pre-delivery) - Echo 4/21 EF 15-20% suspect peri-partum vs adriamycin toxicity  - Echo 7/21 EF 35-40% - Much improved; NYHA I-II - Volume status looks good off lasix - Continue Entresto 97/103 bid - Continue Spiro 25 daily - Increase carvedilol to 6.25 bid; counseled this may make her slightly more tired, if not able to tolerate, OK to go back to 3.125;  - Continue Farxiga 10 - Appt with Oncology 11/4 to discuss more permanent birth control options (shots vs Oophorectomy) - Needs BMET; will ask for labs to be drawn via port at Oncology appt tomorrow  2. HTN - Blood pressure well controlled. Continue current regimen.  3. Breast CA - s/p treatment with taxol and adriamycin   Follow up 4 months  Glori Bickers, MD  12:08 PM

## 2020-01-13 ENCOUNTER — Ambulatory Visit (HOSPITAL_COMMUNITY)
Admission: RE | Admit: 2020-01-13 | Discharge: 2020-01-13 | Disposition: A | Discharge status: Home / Self Care | Payer: Medicaid Other | Source: Ambulatory Visit | Attending: Internal Medicine | Admitting: Internal Medicine

## 2020-01-13 ENCOUNTER — Other Ambulatory Visit: Payer: Self-pay

## 2020-01-13 ENCOUNTER — Encounter (HOSPITAL_COMMUNITY): Payer: Self-pay | Admitting: Internal Medicine

## 2020-01-13 VITALS — BP 128/88 | HR 66 | Wt 176.6 lb

## 2020-01-13 DIAGNOSIS — Z87891 Personal history of nicotine dependence: Secondary | ICD-10-CM | POA: Insufficient documentation

## 2020-01-13 DIAGNOSIS — C50812 Malignant neoplasm of overlapping sites of left female breast: Secondary | ICD-10-CM

## 2020-01-13 DIAGNOSIS — Z923 Personal history of irradiation: Secondary | ICD-10-CM | POA: Insufficient documentation

## 2020-01-13 DIAGNOSIS — Z803 Family history of malignant neoplasm of breast: Secondary | ICD-10-CM | POA: Diagnosis not present

## 2020-01-13 DIAGNOSIS — I5021 Acute systolic (congestive) heart failure: Secondary | ICD-10-CM | POA: Insufficient documentation

## 2020-01-13 DIAGNOSIS — Z853 Personal history of malignant neoplasm of breast: Secondary | ICD-10-CM | POA: Diagnosis not present

## 2020-01-13 DIAGNOSIS — I5022 Chronic systolic (congestive) heart failure: Secondary | ICD-10-CM | POA: Diagnosis not present

## 2020-01-13 DIAGNOSIS — Z7984 Long term (current) use of oral hypoglycemic drugs: Secondary | ICD-10-CM | POA: Diagnosis not present

## 2020-01-13 DIAGNOSIS — I110 Hypertensive heart disease with heart failure: Secondary | ICD-10-CM | POA: Insufficient documentation

## 2020-01-13 DIAGNOSIS — I428 Other cardiomyopathies: Secondary | ICD-10-CM | POA: Insufficient documentation

## 2020-01-13 DIAGNOSIS — Z79899 Other long term (current) drug therapy: Secondary | ICD-10-CM | POA: Diagnosis not present

## 2020-01-13 DIAGNOSIS — Z170 Estrogen receptor positive status [ER+]: Secondary | ICD-10-CM

## 2020-01-13 MED ORDER — CARVEDILOL 6.25 MG PO TABS
6.2500 mg | ORAL_TABLET | Freq: Two times a day (BID) | ORAL | 6 refills | Status: DC
Start: 2020-01-13 — End: 2020-05-12

## 2020-01-13 NOTE — Addendum Note (Signed)
Encounter addended by: Jolaine Artist, MD on: 01/13/2020 12:10 PM  Actions taken: Follow-up modified

## 2020-01-13 NOTE — Progress Notes (Signed)
Patient Care Team: [name], NP as PCP - General (Nurse Practitioner) [name], RN as Oncology Nurse Navigator Rockwell Germany, RN as Oncology Nurse Navigator Rolm Bookbinder, MD as Surgeon (General Surgery) Nicholas Lose, MD as Medical Oncologist (Hematology and Oncology)  DIAGNOSIS:    ICD-10-CM   1. Malignant neoplasm of overlapping sites of left breast in female, estrogen receptor positive ([place])  C50.812    Z17.0     SUMMARY OF ONCOLOGIC HISTORY: Oncology History  Malignant neoplasm of overlapping sites of left breast in female, estrogen receptor positive ([place])  02/26/2019 Initial Diagnosis   Pregnant lady with 32-month history of left breast swelling and skin thickening, mammogram showed pleomorphic calcifications 8 cm, axilla lymph node biopsy positive ER 60%, PR 60%, Ki-67 50%, HER-2 negative ratio 1.25, anterior and posterior end of the calcifications biopsied: 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 ER 50%, PR 30%, Ki-67 50%, HER-2 negative ratio 1.03   03/04/2019 Cancer Staging   Staging form: Breast, AJCC 8th Edition - Clinical stage from 03/04/2019: Stage IIIB (cT4, cN1, cM0, G3, ER+, PR+, HER2-) - Signed by Nicholas Lose, MD on 03/04/2019    Neo-Adjuvant Chemotherapy   Adriamycin and Cytoxan x 4 given every three weeks without Neulasta support., followed by weekly Taxol x 12 versus Taxotere Cytoxan after her delivery   04/15/2019 Genetic Testing   Negative genetic testing:  No pathogenic variants detected on the [employer] Breast Cancer Guidelines-Based Panel or the Common Hereditary Cancers Panel. The report date is 04/15/2019.  The Breast Cancer Guidelines-Based panel offered by [facility] includes sequencing and rearrangement analysis for the following 11 genes:  ATM, BRCA1, BRCA2, CDH1, CHEK2, NBN, NF1, PALB2, PTEN, STK11 and TP53.  The Common Hereditary Cancers Panel offered by [facility] includes sequencing and/or deletion duplication testing of the  following 48 genes: APC, ATM, AXIN2, BARD1, BMPR1A, BRCA1, BRCA2, BRIP1, CDH1, CDK4, CDKN2A (p14ARF), CDKN2A (p16INK4a), CHEK2, CTNNA1, DICER1, EPCAM (Deletion/duplication testing only), GREM1 (promoter region deletion/duplication testing only), KIT, MEN1, MLH1, MSH2, MSH3, MSH6, MUTYH, NBN, NF1, NHTL1, PALB2, PDGFRA, PMS2, POLD1, POLE, PTEN, RAD50, RAD51C, RAD51D, RNF43, SDHB, SDHC, SDHD, SMAD4, SMARCA4. STK11, TP53, TSC1, TSC2, and VHL.  The following genes were evaluated for sequence changes only: SDHA and HOXB13 c.251G>A variant only.    10/08/2019 Surgery   Bilateral mastectomies [name]):  Right mastectomy: Multifocal grade 2 IDC 0.8 cm and 0.6 cm with high-grade DCIS, margins negative, 0/4 lymph nodes ER 95%, PR 5%, Ki-67 10%, HER-2 negative;  Left mastectomy: IDC 0.6 cm, LVI, margins negative, 1/4 lymph nodes positive, ER 60%, PR 60%, HER-2 negative, Ki-67 50% RCB class II   11/30/2019 - 01/08/2020 Radiation Therapy   Adjuvant radiation     CHIEF COMPLIANT: Follow-up to discuss antiestrogen therapy  INTERVAL HISTORY: Melissa [name] is a 29 y.o. with above-mentioned history of left breast cancerwho completedneoadjuvant chemotherapy, underwent bilateral mastectomies, and completed radiation on 01/08/20. She presents to the clinic todayto discuss antiestrogen therapy.   Patient is sore from radiation.  Significant radiation dermatitis.  ALLERGIES:  has No Known Allergies.  MEDICATIONS:  Current Outpatient Medications  Medication Sig Dispense Refill  . carvedilol (COREG) 6.25 MG tablet Take 1 tablet (6.25 mg total) by mouth 2 (two) times daily. 60 tablet 6  . dapagliflozin propanediol (FARXIGA) 10 MG TABS tablet Take 1 tablet (10 mg total) by mouth daily before breakfast. 30 tablet 11  . furosemide (LASIX) 20 MG tablet Take 1 tablet (20 mg total) by mouth daily as  needed for fluid or edema. 90 tablet 2  . gabapentin (NEURONTIN) 100 MG capsule Take 1 capsule (100 mg total) by mouth at  bedtime. 30 capsule 0  . methocarbamol (ROBAXIN) 500 MG tablet Take 1 tablet (500 mg total) by mouth 3 (three) times daily. 30 tablet 1  . sacubitril-valsartan (ENTRESTO) 97-103 MG Take 1 tablet by mouth 2 (two) times daily. 60 tablet 11  . spironolactone (ALDACTONE) 25 MG tablet Take 1 tablet (25 mg total) by mouth daily. 90 tablet 3   No current facility-administered medications for this visit.    PHYSICAL EXAMINATION: ECOG PERFORMANCE STATUS: 1 - Symptomatic but completely ambulatory  Vitals:   01/14/20 1013  BP: 115/69  Pulse: 78  Resp: 18  Temp: 97.6 F (36.4 C)  SpO2: 100%   Filed Weights   01/14/20 1013  Weight: 176 lb 12.8 oz (80.2 kg)    LABORATORY DATA:  I have reviewed the data as listed CMP Latest Ref Rng & Units 10/08/2019 09/30/2019 09/18/2019  Glucose 70 - 99 mg/dL 94 100(H) 91  BUN 6 - 20 mg/dL $Remove'19 14 13  'zYOSOMd$ Creatinine 0.44 - 1.00 mg/dL 0.90 0.84 0.79  Sodium 135 - 145 mmol/L 138 138 140  Potassium 3.5 - 5.1 mmol/L 3.9 4.5 4.0  Chloride 98 - 111 mmol/L 99 102 107  CO2 22 - 32 mmol/L - 28 24  Calcium 8.9 - 10.3 mg/dL - 9.3 9.5  Total Protein 6.5 - 8.1 g/dL - 7.3 6.6  Total Bilirubin 0.3 - 1.2 mg/dL - 0.6 0.4  Alkaline Phos 38 - 126 U/L - 59 56  AST 15 - 41 U/L - 22 18  ALT 0 - 44 U/L - 12 13    Lab Results  Component Value Date   WBC 4.9 09/30/2019   HGB 9.9 (L) 10/08/2019   HCT 29.0 (L) 10/08/2019   MCV 98.0 09/30/2019   PLT 283 09/30/2019   NEUTROABS 2.0 09/18/2019    ASSESSMENT & PLAN:  Malignant neoplasm of overlapping sites of left breast in female, estrogen receptor positive ([place]) 02/26/2019:Pregnant lady with 25-month history of left breast swelling and skin thickening, mammogram showed pleomorphic calcifications 8 cm, axilla lymph node biopsy positive ER 60%, PR 60%, Ki-67 50%, HER-2 negative ratio 1.25, anterior and posterior end of the calcifications biopsied: 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 ER 50%, PR 30%, Ki-67 50%, HER-2  negative ratio 1.03 T4N1 stage IIIb clinical stage 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 Neoadjuvant chemotherapy with dose dense Adriamycin and Cytoxan given every 3 weeks without Neulasta support given her pregnancycompleted 05/28/2019 2. Post-Partumweekly Taxol x12starting 07/03/2019 3. Bilateral mastectomy 10/08/2019: Right mastectomy: Multifocal grade 2 IDC 0.8 cm and 0.6 cm with high-grade DCIS, margins negative, 0/4lymph nodes ER 95%, PR 5%, Ki-67 10%, HER-2 negative;left mastectomy: IDC 0.6 cm, LVI, margins negative, 1/4 lymph nodes positive, ER 60%, PR 60%, HER-2 negative, Ki-67 50% RCB class II 4. Adjuvant radiation therapy 5. Followed by adjuvant antiestrogen therapy with complete estrogen blockade (ovarian suppression with AI) CT C/A/P on 5/13 shows sclerotic bone lesions, bone scan negative 6.CDK 4 and 6 inhibitor abemaciclibwith antiestrogen therapy ----------------------------------------------------------------------------------------------------------------------------------------------------- Treatment plan:  1. Adjuvant radiation started 11/30/2019-01/08/2020  2. Adjuvant antiestrogen therapy with complete estrogen blockade(versus Oophorectomy)+/- abemaciclib (based on MONARCH-E trial)  We will start her on the Zoladex injections on 02/12/2020.  She will also receive anastrozole starting that day. I also discussed abemaciclib risks and benefits  and she does not want to start it at this time.  Return to clinic in 3 months for survivorship care plan visit.   No orders of the defined types were placed in this encounter.  The patient has a good understanding of the overall plan. she agrees with it. she will call with any problems that may develop before the next visit here.  Total time spent: 30 mins including face to face time and time spent for planning, charting and coordination of  care  Nicholas Lose, MD 01/14/2020  I, Cloyde Reams Dorshimer, am acting as scribe for Dr. Nicholas Lose.  I have reviewed the above documentation for accuracy and completeness, and I agree with the above.

## 2020-01-13 NOTE — Patient Instructions (Signed)
Increase Carvedilol to 6.25 mg Twice daily   PLEASE HAVE LADS DRAWN AT [place] (BMET)  Your physician recommends that you schedule a follow-up appointment in: 4 months  If you have any questions or concerns before your next appointment please send [place] a message through [place] or call our office at [phone].    TO LEAVE A MESSAGE FOR THE NURSE SELECT OPTION 2, PLEASE LEAVE A MESSAGE INCLUDING: . YOUR NAME . DATE OF BIRTH . CALL BACK NUMBER . REASON FOR CALL**this is important as we prioritize the call backs  [place] AS LONG AS YOU CALL BEFORE 4:00 PM  At the [facility], you and your health needs are our priority. As part of our continuing mission to provide you with exceptional heart care, we have created designated Provider Care Teams. These Care Teams include your primary Cardiologist (physician) and Advanced Practice Providers (APPs- Physician Assistants and Nurse Practitioners) who all work together to provide you with the care you need, when you need it.   You may see any of the following providers on your designated Care Team at your next follow up: Marland Kitchen Dr Glori Bickers . Dr Loralie Champagne . Darrick Grinder, NP . Lyda Jester, PA . Audry Riles, PharmD   Please be sure to bring in all your medications bottles to every appointment.

## 2020-01-13 NOTE — Addendum Note (Signed)
Encounter addended by: Jolaine Artist, MD on: 01/13/2020 6:02 PM  Actions taken: Clinical Note Signed

## 2020-01-14 ENCOUNTER — Other Ambulatory Visit: Payer: Self-pay

## 2020-01-14 ENCOUNTER — Inpatient Hospital Stay: Payer: Medicaid Other | Attending: Hematology and Oncology | Admitting: Hematology and Oncology

## 2020-01-14 DIAGNOSIS — Z79899 Other long term (current) drug therapy: Secondary | ICD-10-CM | POA: Insufficient documentation

## 2020-01-14 DIAGNOSIS — Z9012 Acquired absence of left breast and nipple: Secondary | ICD-10-CM | POA: Diagnosis not present

## 2020-01-14 DIAGNOSIS — Z9221 Personal history of antineoplastic chemotherapy: Secondary | ICD-10-CM | POA: Insufficient documentation

## 2020-01-14 DIAGNOSIS — Z452 Encounter for adjustment and management of vascular access device: Secondary | ICD-10-CM | POA: Insufficient documentation

## 2020-01-14 DIAGNOSIS — C50812 Malignant neoplasm of overlapping sites of left female breast: Secondary | ICD-10-CM | POA: Diagnosis not present

## 2020-01-14 DIAGNOSIS — Z170 Estrogen receptor positive status [ER+]: Secondary | ICD-10-CM

## 2020-01-14 DIAGNOSIS — Z923 Personal history of irradiation: Secondary | ICD-10-CM | POA: Insufficient documentation

## 2020-01-14 MED ORDER — ANASTROZOLE 1 MG PO TABS
1.0000 mg | ORAL_TABLET | Freq: Every day | ORAL | 3 refills | Status: DC
Start: 2020-01-14 — End: 2020-03-14

## 2020-01-14 NOTE — Assessment & Plan Note (Signed)
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 Neoadjuvant chemotherapy with dose dense Adriamycin and Cytoxan given every 3 weeks without Neulasta support given her pregnancycompleted 05/28/2019 2. Post-Partumweekly Taxol x12starting 07/03/2019 3. Bilateral mastectomy 10/08/2019: Right mastectomy: Multifocal grade 2 IDC 0.8 cm and 0.6 cm with high-grade DCIS, margins negative, 0/4lymph nodes ER 95%, PR 5%, Ki-67 10%, HER-2 negative;left mastectomy: IDC 0.6 cm, LVI, margins negative, 1/4 lymph nodes positive, ER 60%, PR 60%, HER-2 negative, Ki-67 50% RCB class II 4. Adjuvant radiation therapy 5. Followed by adjuvant antiestrogen therapy with complete estrogen blockade (ovarian suppression with AI) CT C/A/P on 5/13 shows sclerotic bone lesions, bone scan negative 6.CDK 4 and 6 inhibitor abemaciclibwith antiestrogen therapy ----------------------------------------------------------------------------------------------------------------------------------------------------- Treatment plan:  1. Adjuvant radiation started 11/30/2019-01/08/2020  2. Adjuvant antiestrogen therapy with complete estrogen blockade(versus Oophorectomy)plus abemaciclib (based on MONARCH-E trial)  Toxicities:  Return to clinic in 1 month with labs and follow-up

## 2020-01-19 ENCOUNTER — Inpatient Hospital Stay: Payer: Medicaid Other

## 2020-01-19 ENCOUNTER — Other Ambulatory Visit: Payer: Self-pay

## 2020-01-19 DIAGNOSIS — Z170 Estrogen receptor positive status [ER+]: Secondary | ICD-10-CM

## 2020-01-19 DIAGNOSIS — Z452 Encounter for adjustment and management of vascular access device: Secondary | ICD-10-CM | POA: Diagnosis not present

## 2020-01-19 DIAGNOSIS — Z95828 Presence of other vascular implants and grafts: Secondary | ICD-10-CM

## 2020-01-19 DIAGNOSIS — C50812 Malignant neoplasm of overlapping sites of left female breast: Secondary | ICD-10-CM

## 2020-01-19 MED ORDER — HEPARIN SOD (PORK) LOCK FLUSH 100 UNIT/ML IV SOLN
500.0000 [IU] | Freq: Once | INTRAVENOUS | Status: AC
  Administered 2020-01-19: 500 [IU]
  Filled 2020-01-19: qty 5

## 2020-01-19 MED ORDER — SODIUM CHLORIDE 0.9% FLUSH
10.0000 mL | Freq: Once | INTRAVENOUS | Status: AC
  Administered 2020-01-19: 10 mL
  Filled 2020-01-19: qty 10

## 2020-01-21 ENCOUNTER — Encounter: Payer: Self-pay | Admitting: Licensed Clinical Social Worker

## 2020-01-21 NOTE — Progress Notes (Signed)
[place] CSW Progress Note  Clinical Education officer, museum contacted patient by phone to provide ongoing supportive counseling. Patient is done with radiation and her skin is healing. Still working on energy levels and adjusting mentally after cancer experience. Pt frustrated with people in her life expecting her to be fully back to normal. She is also trying to return favors by watching a family member's child but has been exhausted.  Patient is also moving this month and working on getting a vehicle.   CSW provided empathic listening and normalized feelings. Discussed ways to focus on positives and to make it okay to set boundaries so she can continue to heal.   Follow-up: by phone in 2-3 weeks   Christeen Douglas , LCSW

## 2020-01-29 ENCOUNTER — Encounter: Payer: Self-pay | Admitting: Licensed Clinical Social Worker

## 2020-01-29 NOTE — Progress Notes (Signed)
[place] CSW Progress Note  Clinical Education officer, museum received TC from patient who needed support today. She had begun seeing someone while she was going through treatment and he had been a major support for her and her daughters. This weekend, he died unexpectedly (massive heart attack) and patient found him.  CSW allowed patient space to voice her grief and remembrances of him, particularly how he made her laugh, saw her as beautiful even at her worst, and the last conversation they had where he told her she was the most important and not to live with regrets. Patient is understandably missing him and thinking of ways to remember him (tattoo, pictures to his family, time with his family).   CSW will follow-up with patient by phone in 2 weeks. Patient may call if needed prior to that time. Discussed additional counseling and grief resources should they be needed.    Christeen Douglas , LCSW

## 2020-02-11 ENCOUNTER — Encounter: Payer: Self-pay | Admitting: Licensed Clinical Social Worker

## 2020-02-11 NOTE — Progress Notes (Signed)
Melissa [name] presents today for follow-up after completing radiation to her left breast/chest wall on 01/08/2020  Skin: reports treatment field is healing well. Peeling/scabbing has resolved. She states the skin over her chest wall occasionally feels tight, but denies any swelling or restriction in movement. Reports coloring is slowing returning to baseline Pain: patient denies Fatigue: slowly returning. Still has busy days looking after 2 young kids ROM: denies any restrictions Lymphedema: patient denies Other issues of note: Reports a dull pain/ache on the front/lateral side of her abdoman (usually alternates, but occasionally both areas bother her). Patient denies any urinary symptoms (no hematuria or dysuria). Reports she had her first menstrual cycle since her pregnancy at the end of November (regular flow). Scheduled for Survivorship Care Plan with Sharyl Nimrod on 04/15/2019  Wt Readings from Last 3 Encounters:  02/12/20 177 lb 12.8 oz (80.6 kg)  01/14/20 176 lb 12.8 oz (80.2 kg)  01/13/20 176 lb 9.6 oz (80.1 kg)   Vitals:   02/12/20 1017  BP: 119/81  Pulse: 87  Resp: 20  Temp: (!) 97.2 F (36.2 C)  SpO2: 100%

## 2020-02-11 NOTE — Progress Notes (Signed)
[place] CSW Progress Note  Clinical Education officer, museum contacted patient by phone to provide ongoing emotional support. Patient is continuing to process her significant other's death while also dealing with other stressors including moving to [place] and getting ready to file papers for child support due to ongoing stressors with her children's father. CSW allowed space to process and validated feelings. Provided information on [employer] as possible source for some assistance with diapers.  CSW will follow-up by phone in 2-3 weeks. Patient may call as needed prior to that time.   Christeen Douglas , LCSW

## 2020-02-12 ENCOUNTER — Ambulatory Visit
Admission: RE | Admit: 2020-02-12 | Discharge: 2020-02-12 | Disposition: A | Discharge status: Home / Self Care | Payer: Medicaid Other | Source: Ambulatory Visit | Attending: Radiation Oncology | Admitting: Radiation Oncology

## 2020-02-12 ENCOUNTER — Other Ambulatory Visit: Payer: Self-pay

## 2020-02-12 ENCOUNTER — Encounter: Payer: Self-pay | Admitting: Radiation Oncology

## 2020-02-12 ENCOUNTER — Inpatient Hospital Stay: Payer: Medicaid Other | Attending: Hematology and Oncology

## 2020-02-12 VITALS — BP 109/75 | HR 88 | Temp 98.0°F | Resp 18

## 2020-02-12 VITALS — BP 119/81 | HR 87 | Temp 97.2°F | Resp 20 | Wt 177.8 lb

## 2020-02-12 DIAGNOSIS — Z9012 Acquired absence of left breast and nipple: Secondary | ICD-10-CM | POA: Insufficient documentation

## 2020-02-12 DIAGNOSIS — C50812 Malignant neoplasm of overlapping sites of left female breast: Secondary | ICD-10-CM | POA: Insufficient documentation

## 2020-02-12 DIAGNOSIS — Z170 Estrogen receptor positive status [ER+]: Secondary | ICD-10-CM | POA: Insufficient documentation

## 2020-02-12 DIAGNOSIS — Z79899 Other long term (current) drug therapy: Secondary | ICD-10-CM | POA: Insufficient documentation

## 2020-02-12 DIAGNOSIS — Z923 Personal history of irradiation: Secondary | ICD-10-CM | POA: Insufficient documentation

## 2020-02-12 DIAGNOSIS — Z87891 Personal history of nicotine dependence: Secondary | ICD-10-CM | POA: Insufficient documentation

## 2020-02-12 DIAGNOSIS — R5383 Other fatigue: Secondary | ICD-10-CM | POA: Insufficient documentation

## 2020-02-12 DIAGNOSIS — Z5111 Encounter for antineoplastic chemotherapy: Secondary | ICD-10-CM | POA: Diagnosis not present

## 2020-02-12 DIAGNOSIS — Z95828 Presence of other vascular implants and grafts: Secondary | ICD-10-CM

## 2020-02-12 MED ORDER — GOSERELIN ACETATE 3.6 MG ~~LOC~~ IMPL
DRUG_IMPLANT | SUBCUTANEOUS | Status: AC
  Filled 2020-02-12: qty 3.6

## 2020-02-12 MED ORDER — GOSERELIN ACETATE 3.6 MG ~~LOC~~ IMPL
3.6000 mg | DRUG_IMPLANT | Freq: Once | SUBCUTANEOUS | Status: AC
  Administered 2020-02-12: 3.6 mg via SUBCUTANEOUS

## 2020-02-12 NOTE — Progress Notes (Signed)
Radiation Oncology         [phone] ________________________________  Name: Melissa [name] MRN: [id number]  Date: 02/12/2020  DOB: 18-Apr-1990  Follow-Up Visit Note  Outpatient  CC: Melissa Retort, NP  Melissa Lose, MD  Diagnosis and Prior Radiotherapy:    ICD-10-CM   1. Malignant neoplasm of overlapping sites of left breast in female, estrogen receptor positive ([place])  C50.812    Z17.0     CHIEF COMPLAINT: Here for follow-up and surveillance of breast cancer  Narrative:  The patient returns today for routine follow-up.   Melissa [name] presents today for follow-up after completing radiation to her left breast/chest wall on 01/08/2020  Skin: reports treatment field is healing well. Peeling/scabbing has resolved. She states the skin over her chest wall occasionally feels tight, but denies any swelling or restriction in movement. Reports coloring is slowing returning to baseline Pain: patient denies Fatigue: slowly returning. Still has busy days looking after 2 young kids ROM: denies any restrictions Lymphedema: patient denies   Wt Readings from Last 3 Encounters:  02/12/20 177 lb 12.8 oz (80.6 kg)  01/14/20 176 lb 12.8 oz (80.2 kg)  01/13/20 176 lb 9.6 oz (80.1 kg)   Vitals:   02/12/20 1017  BP: 119/81  Pulse: 87  Resp: 20  Temp: (!) 97.2 F (36.2 C)  SpO2: 100%                                 ALLERGIES:  has No Known Allergies.  Meds: Current Outpatient Medications  Medication Sig Dispense Refill  . anastrozole (ARIMIDEX) 1 MG tablet Take 1 tablet (1 mg total) by mouth daily. 90 tablet 3  . carvedilol (COREG) 6.25 MG tablet Take 1 tablet (6.25 mg total) by mouth 2 (two) times daily. 60 tablet 6  . dapagliflozin propanediol (FARXIGA) 10 MG TABS tablet Take 1 tablet (10 mg total) by mouth daily before breakfast. 30 tablet 11  . furosemide (LASIX) 20 MG tablet Take 1 tablet (20 mg total) by mouth daily as needed for fluid or edema. 90 tablet 2  . gabapentin (NEURONTIN)  100 MG capsule Take 1 capsule (100 mg total) by mouth at bedtime. 30 capsule 0  . methocarbamol (ROBAXIN) 500 MG tablet Take 1 tablet (500 mg total) by mouth 3 (three) times daily. 30 tablet 1  . sacubitril-valsartan (ENTRESTO) 97-103 MG Take 1 tablet by mouth 2 (two) times daily. 60 tablet 11  . spironolactone (ALDACTONE) 25 MG tablet Take 1 tablet (25 mg total) by mouth daily. 90 tablet 3  . traMADol (ULTRAM) 50 MG tablet Take 50 mg by mouth every 6 (six) hours as needed.     No current facility-administered medications for this encounter.    Physical Findings: The patient is in no acute distress. Patient is alert and oriented.  weight is 177 lb 12.8 oz (80.6 kg). Her temperature is 97.2 F (36.2 C) (abnormal). Her blood pressure is 119/81 and her pulse is 87. Her respiration is 20 and oxygen saturation is 100%. .    Satisfactory skin healing in radiotherapy fields.  The skin over the left chest wall still has dryness and pigment changes but is intact   Lab Findings: Lab Results  Component Value Date   WBC 4.9 09/30/2019   HGB 9.9 (L) 10/08/2019   HCT 29.0 (L) 10/08/2019   MCV 98.0 09/30/2019   PLT 283 09/30/2019    Radiographic Findings: No  results found.  Impression/Plan: Healing well from radiotherapy to the breast tissue.  Continue skin care with topical cocoa butter or Shea butter for at least 2 more months for further healing.  We spoke for a while about the recent loss of her significant other.  She was tearful at times and expressed that she was amenable to getting a phone call from social work.  I let her know about grief counseling that is available in our community so she can explore this more with social work, if and when she feels ready.  I encouraged her to continue followup with medical oncology.  She sees survivorship clinic in February.  I will see her back on an as-needed basis. I have encouraged her to call if she has any issues or concerns in the future. I  wished her the very best.  On date of service, in total, I spent 20 minutes on this encounter. Patient was seen in person.  _____________________________________   Melissa Gibson, MD

## 2020-02-12 NOTE — Patient Instructions (Signed)

## 2020-02-25 ENCOUNTER — Encounter: Payer: Self-pay | Admitting: Licensed Clinical Social Worker

## 2020-02-25 ENCOUNTER — Telehealth: Payer: Self-pay | Admitting: *Deleted

## 2020-02-25 NOTE — Progress Notes (Signed)
[place] CSW Progress Note  Clinical Education officer, museum contacted patient by phone for ongoing support. Patient is stressed with rent currently as she has Section 8 funds but they have not paid her landlord yet despite saying they would. She is continuing to follow-up with them. CSW will also find and provide any other resources in the [place] area for potential assistance.  Patient continuing to grieve loss of her significant other. She did not want to speak in depth today but has been talking some with his family. CSW provided number for grief counseling through [place] for when patient is ready.     Christeen Douglas , LCSW

## 2020-02-25 NOTE — Telephone Encounter (Signed)
Received call from pt with complaint of upper back pain and chest tightness.  Pt states recently she has been picking both of her children up at the same time and taking them up and down the stairs in her house.  Per MD pt may have pulled a muscle.  Pt educated to rest, apply a heating pad and take ibuprofen as directed on the bottle.  Pt educated to call office if symptoms do not relive with rest. Pt verbalized understanding.

## 2020-02-29 NOTE — Progress Notes (Signed)
Patient Name: Melissa [name] MRN: [id number] DOB: 1990-07-30 Referring Physician: Nicholas Lose (Profile Not Attached) Date of Service: 01/08/2020 [facility], [place]                                                        End Of Treatment Note  Diagnoses: C50.812-Malignant neoplasm of overlapping sites of left female breast  Cancer Staging: Cancer Staging Malignant neoplasm of overlapping sites of left breast in female, estrogen receptor positive ([place]) Staging form: Breast, AJCC 8th Edition - Clinical stage from 03/04/2019: Stage IIIB (cT4, cN1, cM0, G3, ER+, PR+, HER2-) - Signed by Nicholas Lose, MD on 03/04/2019  Left breast cancer: ypT1b, ypN1a Right breast cancer: ypT1b (multifocal), ypN0   Intent: Curative  Radiation Treatment Dates: 11/30/2019 through 01/08/2020 Site Technique Total Dose (Gy) Dose per Fx (Gy) Completed Fx Beam Energies  Chest Wall, Left: CW_Lt_IMN 3D 50/50 2 25/25 6X, 10X  Chest Wall, Left: CW_Lt_SCV_PAB 3D 50/50 2 25/25 6X, 10X  Chest Wall, Left: CW_Lt_Bst Electron 10/10 2 5/5 6E   Narrative: The patient tolerated radiation therapy relatively well.   Plan: The patient will follow-up with radiation oncology in 69mo .  -----------------------------------  Eppie Gibson, MD

## 2020-03-14 ENCOUNTER — Inpatient Hospital Stay: Payer: Medicaid Other | Attending: Hematology and Oncology

## 2020-03-14 ENCOUNTER — Other Ambulatory Visit: Payer: Self-pay

## 2020-03-14 VITALS — BP 123/71 | HR 66 | Temp 98.9°F | Resp 18

## 2020-03-14 DIAGNOSIS — Z95828 Presence of other vascular implants and grafts: Secondary | ICD-10-CM

## 2020-03-14 DIAGNOSIS — Z170 Estrogen receptor positive status [ER+]: Secondary | ICD-10-CM | POA: Insufficient documentation

## 2020-03-14 DIAGNOSIS — C50812 Malignant neoplasm of overlapping sites of left female breast: Secondary | ICD-10-CM

## 2020-03-14 DIAGNOSIS — C7951 Secondary malignant neoplasm of bone: Secondary | ICD-10-CM | POA: Insufficient documentation

## 2020-03-14 DIAGNOSIS — Z7984 Long term (current) use of oral hypoglycemic drugs: Secondary | ICD-10-CM | POA: Diagnosis not present

## 2020-03-14 DIAGNOSIS — Z9013 Acquired absence of bilateral breasts and nipples: Secondary | ICD-10-CM | POA: Insufficient documentation

## 2020-03-14 DIAGNOSIS — Z79899 Other long term (current) drug therapy: Secondary | ICD-10-CM | POA: Insufficient documentation

## 2020-03-14 DIAGNOSIS — Z5111 Encounter for antineoplastic chemotherapy: Secondary | ICD-10-CM | POA: Insufficient documentation

## 2020-03-14 MED ORDER — ANASTROZOLE 1 MG PO TABS: 1.0000 mg | ORAL_TABLET | Freq: Every day | ORAL | 0 refills | Status: DC

## 2020-03-14 MED ORDER — GOSERELIN ACETATE 3.6 MG ~~LOC~~ IMPL
3.6000 mg | DRUG_IMPLANT | Freq: Once | SUBCUTANEOUS | Status: AC
  Administered 2020-03-14: 3.6 mg via SUBCUTANEOUS

## 2020-03-14 MED ORDER — GOSERELIN ACETATE 3.6 MG ~~LOC~~ IMPL
DRUG_IMPLANT | SUBCUTANEOUS | Status: AC
  Filled 2020-03-14: qty 3.6

## 2020-03-14 NOTE — Patient Instructions (Signed)

## 2020-03-15 ENCOUNTER — Encounter: Payer: Self-pay | Admitting: Licensed Clinical Social Worker

## 2020-03-15 ENCOUNTER — Telehealth: Payer: Self-pay | Admitting: Licensed Clinical Social Worker

## 2020-03-15 ENCOUNTER — Telehealth: Payer: Self-pay

## 2020-03-15 ENCOUNTER — Encounter: Payer: Self-pay | Admitting: Hematology and Oncology

## 2020-03-15 NOTE — Telephone Encounter (Signed)
RN spoke with patient to inform her that we have received forms from [employer] to provide assistance with Anastrozole.   Pt will come by clinic on 1/5 to complete paperwork.

## 2020-03-15 NOTE — Progress Notes (Signed)
[facility] CSW Progress Note  Clinical Child psychotherapist contacted patient by phone for general support check-in.  Patient is doing fairly well right now. Practical needs (rent, car) are being resolved.  Grief over death of significant other is becoming easier to manage and she has a good friendship with his sister, so they are able to support one another.  Patient is trying to focus on positives and has been able to identify good changes that she has made (no longer smoking cigarettes or drinking) and is trying to focus on the good this year. She was able to attend support group and found it to be helpful to know that she is not alone.  CSW reminded patient of upcoming FYNN class.  Patient may call as needed for other support.     Merlyn Albert , LCSW

## 2020-03-15 NOTE — Telephone Encounter (Signed)
CSW attempted to contact patient for general support. Left VM.   Merlyn Albert, LCSW

## 2020-03-15 NOTE — Progress Notes (Signed)
Gave application for RX outreach to [employer] to have patient and physician complete their portion and fax back,  Patient has my card for any additional financial questions or concerns.

## 2020-03-16 ENCOUNTER — Other Ambulatory Visit: Payer: Self-pay | Admitting: *Deleted

## 2020-03-16 ENCOUNTER — Encounter: Payer: Self-pay | Admitting: *Deleted

## 2020-03-16 MED ORDER — ANASTROZOLE 1 MG PO TABS: 1.0000 mg | ORAL_TABLET | Freq: Every day | ORAL | 3 refills | Status: DC

## 2020-03-16 NOTE — Progress Notes (Signed)
RN successfully faxed [phone]) RX Outreach medication assistance form filled out by pt as well as prescription for Anastrozole 1 mg tablet.

## 2020-03-17 ENCOUNTER — Telehealth: Payer: Self-pay | Admitting: *Deleted

## 2020-03-17 NOTE — Telephone Encounter (Signed)
Received call from pt with complaint of pain and tightness under bilateral ribcage.  Pt requesting to be seen by MD for further evaluation and treatment.  Per MD pt to be scheduled with Lillard Anes, NP for further evaluation. Apt scheduled and verbalized understanding.

## 2020-03-21 ENCOUNTER — Inpatient Hospital Stay (HOSPITAL_BASED_OUTPATIENT_CLINIC_OR_DEPARTMENT_OTHER): Payer: Medicaid Other | Admitting: Adult Health

## 2020-03-21 ENCOUNTER — Inpatient Hospital Stay: Payer: Medicaid Other

## 2020-03-21 ENCOUNTER — Other Ambulatory Visit: Payer: Self-pay

## 2020-03-21 ENCOUNTER — Encounter: Payer: Self-pay | Admitting: Adult Health

## 2020-03-21 ENCOUNTER — Other Ambulatory Visit: Payer: Self-pay | Admitting: Adult Health

## 2020-03-21 VITALS — BP 104/73 | HR 84 | Temp 97.3°F | Resp 18 | Wt 180.2 lb

## 2020-03-21 DIAGNOSIS — C50812 Malignant neoplasm of overlapping sites of left female breast: Secondary | ICD-10-CM

## 2020-03-21 DIAGNOSIS — Z170 Estrogen receptor positive status [ER+]: Secondary | ICD-10-CM

## 2020-03-21 DIAGNOSIS — Z5111 Encounter for antineoplastic chemotherapy: Secondary | ICD-10-CM | POA: Diagnosis not present

## 2020-03-21 DIAGNOSIS — Z95828 Presence of other vascular implants and grafts: Secondary | ICD-10-CM

## 2020-03-21 MED ORDER — ANASTROZOLE 1 MG PO TABS: 1.0000 mg | ORAL_TABLET | Freq: Every day | ORAL | 3 refills | Status: DC

## 2020-03-21 MED ORDER — SODIUM CHLORIDE 0.9% FLUSH
10.0000 mL | Freq: Once | INTRAVENOUS | Status: AC
  Administered 2020-03-21: 10 mL
  Filled 2020-03-21: qty 10

## 2020-03-21 MED ORDER — HEPARIN SOD (PORK) LOCK FLUSH 100 UNIT/ML IV SOLN
500.0000 [IU] | Freq: Once | INTRAVENOUS | Status: AC
Start: 2020-03-21 — End: 2020-03-21
  Administered 2020-03-21: 500 [IU]
  Filled 2020-03-21: qty 5

## 2020-03-21 MED FILL — ANASTROZOLE 1 MG TABLET: 1 | 30 days supply | Qty: 30 | Fill #0

## 2020-03-21 NOTE — Progress Notes (Signed)
Melissa [name] Follow up:    [name], NP [address] [place] [address]   DIAGNOSIS: [name] Staging Malignant neoplasm of overlapping sites of left breast in female, estrogen receptor positive ([place]) Staging form: Breast, AJCC 8th Edition - Clinical stage from 03/04/2019: Stage IIIB (cT4, cN1, cM0, G3, ER+, PR+, HER2-) - Signed by Nicholas Lose, MD on 03/04/2019   SUMMARY OF ONCOLOGIC HISTORY: Oncology History  Malignant neoplasm of overlapping sites of left breast in female, estrogen receptor positive ([name])  02/26/2019 Initial Diagnosis   Pregnant lady with 40-monthhistory of left breast swelling and skin thickening, mammogram showed pleomorphic calcifications 8 cm, axilla lymph node biopsy positive ER 60%, PR 60%, Ki-67 50%, HER-2 negative ratio 1.25, anterior and posterior end of the calcifications biopsied: 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 ER 50%, PR 30%, Ki-67 50%, HER-2 negative ratio 1.03   03/04/2019 [name] Staging   Staging form: Breast, AJCC 8th Edition - Clinical stage from 03/04/2019: Stage IIIB (cT4, cN1, cM0, G3, ER+, PR+, HER2-) - Signed by GNicholas Lose MD on 03/04/2019    Neo-Adjuvant Chemotherapy   Adriamycin and Cytoxan x 4 given every three weeks without Neulasta support., followed by weekly Taxol x 12 versus Taxotere Cytoxan after her delivery   04/15/2019 Genetic Testing   Negative genetic testing:  No pathogenic variants detected on the [facility] Breast [name] Guidelines-Based Panel or the Common Hereditary Cancers Panel. The report date is 04/15/2019.  The Breast [name] Guidelines-Based panel offered by [facility] includes sequencing and rearrangement analysis for the following 11 genes:  ATM, BRCA1, BRCA2, CDH1, CHEK2, NBN, NF1, PALB2, PTEN, STK11 and TP53.  The Common Hereditary Cancers Panel offered by [facility] includes sequencing and/or deletion duplication testing of the following 48 genes: APC, ATM,  AXIN2, BARD1, BMPR1A, BRCA1, BRCA2, BRIP1, CDH1, CDK4, CDKN2A (p14ARF), CDKN2A (p16INK4a), CHEK2, CTNNA1, DICER1, EPCAM (Deletion/duplication testing only), GREM1 (promoter region deletion/duplication testing only), KIT, MEN1, MLH1, MSH2, MSH3, MSH6, MUTYH, NBN, NF1, NHTL1, PALB2, PDGFRA, PMS2, POLD1, POLE, PTEN, RAD50, RAD51C, RAD51D, RNF43, SDHB, SDHC, SDHD, SMAD4, SMARCA4. STK11, TP53, TSC1, TSC2, and VHL.  The following genes were evaluated for sequence changes only: SDHA and HOXB13 c.251G>A variant only.    10/08/2019 Surgery   Bilateral mastectomies ([name]:  Right mastectomy: Multifocal grade 2 IDC 0.8 cm and 0.6 cm with high-grade DCIS, margins negative, 0/4 lymph nodes ER 95%, PR 5%, Ki-67 10%, HER-2 negative;  Left mastectomy: IDC 0.6 cm, LVI, margins negative, 1/4 lymph nodes positive, ER 60%, PR 60%, HER-2 negative, Ki-67 50% RCB class II   11/30/2019 - 01/08/2020 Radiation Therapy   Adjuvant radiation     CURRENT THERAPY: Anastrozole and Zoladex  INTERVAL HISTORY: APaislie Tessler89y.o. female returns for evaluation of back pain.  She notes that this pain is intermittent.  She says it has been going on for about a month and has been increasing in frequency.  She does live in an apartment and carries her young children up the stairs.  She tells me that she has not yet started on Anastrozole because the pharmacist told her it would cost $500.     Patient Active Problem List   Diagnosis Date Noted  . Breast [name], left breast ([place] 10/08/2019  . Genetic testing 04/15/2019  . Port-A-Cath in place 03/25/2019  . Family history of breast [name]   . Family history of colon [name]   . Family history of throat [name]   . Malignant  neoplasm of overlapping sites of left breast in female, estrogen receptor positive ([name]) 03/04/2019  . PCOS (polycystic ovarian syndrome) 08/22/2012  . Anovulation 07/09/2012  . ASCUS (atypical squamous cells of undetermined significance) on Pap smear  12/04/2010    has No Known Allergies.  MEDICAL HISTORY: Past Medical History:  Diagnosis Date  . Abnormal Pap smear 07/13/11   ASCUS +HPV  . Anemia   . [name] [facility])    breast [name]  . CHF (congestive heart failure) ([place])   . Family history of breast [name]   . Family history of colon [name]   . Family history of throat [name]   . Fracture of distal phalanx of finger 10/05/2019   Right thumb and middle finger  . Herpes simplex without mention of complication    dx age 1    SURGICAL HISTORY: Past Surgical History:  Procedure Laterality Date  . BREAST BIOPSY Left 03/18/2019   Procedure: LEFT BREAST SKIN PUNCH BIOPSY;  Surgeon: Rolm Bookbinder, MD;  Location: [place];  Service: General;  Laterality: Left;  . COLPOSCOPY     CIN I ECC -  . MASTECTOMY Bilateral 10/08/2019   BILATERAL MASTECTOMY WITH LEFT AXILLARY SENTINEL LYMPH NODE BIOPSY (Bilateral Breast)   . MASTECTOMY W/ SENTINEL NODE BIOPSY Bilateral 10/08/2019   Procedure: BILATERAL MASTECTOMY WITH LEFT AXILLARY SENTINEL LYMPH NODE BIOPSY;  Surgeon: Rolm Bookbinder, MD;  Location: [place];  Service: General;  Laterality: Bilateral;  BILATERAL PEC BLOCK  . NO PAST SURGERIES    . PORTACATH PLACEMENT N/A 03/18/2019   Procedure: INSERTION PORT-A-CATH WITH ULTRASOUND GUIDANCE;  Surgeon: Rolm Bookbinder, MD;  Location: [place];  Service: General;  Laterality: N/A;  . RADIOACTIVE SEED GUIDED AXILLARY SENTINEL LYMPH NODE Left 10/08/2019   Procedure: LEFT RADIOACTIVE SEED GUIDED AXILLARY SENTINEL LYMPH NODE EXCISION;  Surgeon: Rolm Bookbinder, MD;  Location: [place];  Service: General;  Laterality: Left;  PEC BLOCK    SOCIAL HISTORY: Social History   Socioeconomic History  . Marital status: Single    Spouse name: Not on file  . Number of children: Not on file  . Years of education: Not on file  . Highest education level: Not on file  Occupational History  . Not on file  Tobacco Use  . Smoking status: Former Smoker    Types:  Cigarettes    Quit date: 02/27/2019    Years since quitting: 1.0  . Smokeless tobacco: Never Used  Vaping Use  . Vaping Use: Never used  Substance and Sexual Activity  . Alcohol use: Not Currently  . Drug use: Yes    Frequency: 2.0 times per week    Types: Marijuana    Comment: x1 weekly  . Sexual activity: Yes    Birth control/protection: None  Other Topics Concern  . Not on file  Social History Narrative  . Not on file   Social Determinants of Health   Financial Resource Strain: Not on file  Food Insecurity: Not on file  Transportation Needs: Not on file  Physical Activity: Not on file  Stress: Not on file  Social Connections: Not on file  Intimate Partner Violence: Not on file    FAMILY HISTORY: Family History  Problem Relation Age of Onset  . Breast [name] Paternal Grandmother        dx. 52s or younger  . Stroke Father 52  . Hypertension Father   . [name] Maternal Aunt        unknown type, dx. late 15s  .  Colon [name] Paternal Uncle        dx 28s  . Throat [name] Maternal Grandmother        dx. 46s  . [name] Paternal Uncle        unknown type, dx. 21s  . Breast [name] Cousin        dx. 17s, paternal 1st cousin    Review of Systems  Constitutional: Negative for appetite change, chills and fatigue.  HENT:   Negative for hearing loss and lump/mass.   Eyes: Negative for eye problems and icterus.  Respiratory: Negative for chest tightness, cough and shortness of breath.   Cardiovascular: Negative for chest pain, leg swelling and palpitations.  Gastrointestinal: Negative for abdominal distention, abdominal pain, constipation, diarrhea, nausea and vomiting.  Musculoskeletal: Positive for arthralgias and back pain.  Skin: Negative for rash.  Neurological: Negative for dizziness, extremity weakness, headaches and numbness.  Hematological: Negative for adenopathy. Does not bruise/bleed easily.  Psychiatric/Behavioral: Negative for depression. The patient is  nervous/anxious.       PHYSICAL EXAMINATION  ECOG PERFORMANCE STATUS: 1 - Symptomatic but completely ambulatory  Vitals:   03/21/20 1210  BP: 104/73  Pulse: 84  Resp: 18  Temp: (!) 97.3 F (36.3 C)  SpO2: 100%    Physical Exam Constitutional:      General: She is not in acute distress.    Appearance: Normal appearance. She is not toxic-appearing.  HENT:     Head: Normocephalic and atraumatic.  Eyes:     General: No scleral icterus. Cardiovascular:     Rate and Rhythm: Normal rate and regular rhythm.     Pulses: Normal pulses.     Heart sounds: Normal heart sounds.  Pulmonary:     Effort: Pulmonary effort is normal.     Breath sounds: Normal breath sounds.     Comments: S/p bilateral mastectomies, no tenderness, no sign of local recurrence  Abdominal:     General: Abdomen is flat. Bowel sounds are normal. There is no distension.     Palpations: Abdomen is soft.     Tenderness: There is no abdominal tenderness.  Musculoskeletal:        General: No swelling or tenderness.     Cervical back: Neck supple.  Lymphadenopathy:     Cervical: No cervical adenopathy.  Skin:    General: Skin is warm and dry.     Findings: No rash.  Neurological:     General: No focal deficit present.     Mental Status: She is alert.  Psychiatric:        Mood and Affect: Mood normal.        Behavior: Behavior normal.     LABORATORY DATA:  CBC    Component Value Date/Time   WBC 4.9 09/30/2019 1353   RBC 3.93 09/30/2019 1353   HGB 9.9 (L) 10/08/2019 1508   HGB 10.3 (L) 09/18/2019 1205   HCT 29.0 (L) 10/08/2019 1508   PLT 283 09/30/2019 1353   PLT 248 09/18/2019 1205   MCV 98.0 09/30/2019 1353   MCH 30.8 09/30/2019 1353   MCHC 31.4 09/30/2019 1353   RDW 18.3 (H) 09/30/2019 1353   LYMPHSABS 1.6 09/18/2019 1205   MONOABS 0.3 09/18/2019 1205   EOSABS 0.1 09/18/2019 1205   BASOSABS 0.1 09/18/2019 1205    CMP     Component Value Date/Time   NA 138 10/08/2019 1508   K 3.9  10/08/2019 1508   CL 99 10/08/2019 1508   CO2 28 09/30/2019  1353   GLUCOSE 94 10/08/2019 1508   BUN 19 10/08/2019 1508   CREATININE 0.90 10/08/2019 1508   CREATININE 0.79 09/18/2019 1205   CALCIUM 9.3 09/30/2019 1353   PROT 7.3 09/30/2019 1353   ALBUMIN 3.9 09/30/2019 1353   AST 22 09/30/2019 1353   AST 18 09/18/2019 1205   ALT 12 09/30/2019 1353   ALT 13 09/18/2019 1205   ALKPHOS 59 09/30/2019 1353   BILITOT 0.6 09/30/2019 1353   BILITOT 0.4 09/18/2019 1205   GFRNONAA >60 09/30/2019 1353   GFRNONAA >60 09/18/2019 1205   GFRAA >60 09/30/2019 1353   GFRAA >60 09/18/2019 1205           ASSESSMENT and THERAPY PLAN:   Malignant neoplasm of overlapping sites of left breast in female, estrogen receptor positive ([place]) 02/26/2019:Pregnant lady with 60-monthhistory of left breast swelling and skin thickening, mammogram showed pleomorphic calcifications 8 cm, axilla lymph node biopsy positive ER 60%, PR 60%, Ki-67 50%, HER-2 negative ratio 1.25, anterior and posterior end of the calcifications biopsied: 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 Neoadjuvant chemotherapy with dose dense Adriamycin and Cytoxan given every 3 weeks without Neulasta support given her pregnancycompleted 05/28/2019 2. Post-Partumweekly Taxol x12starting 07/03/2019 3. Bilateral mastectomy 10/08/2019: Right mastectomy: Multifocal grade 2 IDC 0.8 cm and 0.6 cm with high-grade DCIS, margins negative, 0/4lymph nodes ER 95%, PR 5%, Ki-67 10%, HER-2 negative;left mastectomy: IDC 0.6 cm, LVI, margins negative, 1/4 lymph nodes positive, ER 60%, PR 60%, HER-2 negative, Ki-67 50% RCB class II 4. Adjuvant radiation therapy 5. Followed by adjuvant antiestrogen therapy with complete estrogen blockade (ovarian  suppression with AI) CT C/A/P on 5/13 shows sclerotic bone lesions, bone scan negative 6.Declined CDK 4 and 6 inhibitor abemaciclibwith antiestrogen therapy ---------------------------------------------------------------------------------------------------------------------------------------------------- ASaiyais here today for f/u of her back pain.  This is likely secondary to carrying her children and to her ovarian suppression contributing to joint aches.  Considering her scans from May, and presenting stage at diagnosis of IIIB, I have ordered repeat imaging with bone scan and CT chest/abdomen/pelvis.  In the meantime, I recommended that she continue to take tylenol to alleviate her discomfort.  She understands this.    [facility].  It should not cost $500 with her insurance. She understands that after today, [facility] can mail her prescriptions to her.    I will see Iylah back in 1 month for her SCP visit.     Orders Placed This Encounter  Procedures  . CT Chest W Contrast    Standing Status:   Future    Standing Expiration Date:   03/21/2021    Order Specific Question:   If indicated for the ordered procedure, I authorize the administration of contrast media per Radiology protocol    Answer:   Yes    Order Specific Question:   Is patient pregnant?    Answer:   No    Order Specific Question:   [facility]    Standing Status:   Future    Standing Expiration Date:   03/21/2021    Order Specific Question:   If indicated for the ordered procedure, I  authorize the administration of contrast media per Radiology protocol    Answer:   Yes    Order Specific Question:   Is patient pregnant?    Answer:   No    Order Specific Question:   Preferred imaging location?    Answer:   [facility]    Order Specific Question:   Is Oral Contrast requested for  this exam?    Answer:   Yes, Per Radiology protocol  . NM Bone Scan Whole Body    Standing Status:   Future    Standing Expiration Date:   03/21/2021    Order Specific Question:   If indicated for the ordered procedure, I authorize the administration of a radiopharmaceutical per Radiology protocol    Answer:   Yes    Order Specific Question:   Is the patient pregnant?    Answer:   No    Order Specific Question:   Preferred imaging location?    Answer:   [facility]    All questions were answered. The patient knows to call the clinic with any problems, questions or concerns. We can certainly see the patient much sooner if necessary.  Total encounter time: 20 minutes*  Wilber Bihari, NP 03/21/20 1:42 PM Medical Oncology and Hematology [facility] [place], [place] [address] Tel. [phone]    Fax. [phone]  *Total Encounter Time as defined by the Centers for Medicare and Medicaid Services includes, in addition to the face-to-face time of a patient visit (documented in the note above) non-face-to-face time: obtaining and reviewing outside history, ordering and reviewing medications, tests or procedures, care coordination (communications with other health care professionals or caregivers) and documentation in the medical record.

## 2020-03-21 NOTE — Patient Instructions (Signed)

## 2020-03-21 NOTE — Assessment & Plan Note (Addendum)
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 Neoadjuvant chemotherapy with dose dense Adriamycin and Cytoxan given every 3 weeks without Neulasta support given her pregnancycompleted 05/28/2019 2. Post-Partumweekly Taxol x12starting 07/03/2019 3. Bilateral mastectomy 10/08/2019: Right mastectomy: Multifocal grade 2 IDC 0.8 cm and 0.6 cm with high-grade DCIS, margins negative, 0/4lymph nodes ER 95%, PR 5%, Ki-67 10%, HER-2 negative;left mastectomy: IDC 0.6 cm, LVI, margins negative, 1/4 lymph nodes positive, ER 60%, PR 60%, HER-2 negative, Ki-67 50% RCB class II 4. Adjuvant radiation therapy 5. Followed by adjuvant antiestrogen therapy with complete estrogen blockade (ovarian suppression with AI) CT C/A/P on 5/13 shows sclerotic bone lesions, bone scan negative 6.Declined CDK 4 and 6 inhibitor abemaciclibwith antiestrogen therapy ---------------------------------------------------------------------------------------------------------------------------------------------------- Melissa [name] is here today for f/u of her back pain.  This is likely secondary to carrying her children and to her ovarian suppression contributing to joint aches.  Considering her scans from May, and presenting stage at diagnosis of IIIB, I have ordered repeat imaging with bone scan and CT chest/abdomen/pelvis.  In the meantime, I recommended that she continue to take tylenol to alleviate her discomfort.  She understands this.    I sent  Anastrozole to the [facility] outpatient pharmacy for her to pick up.  It should not cost $500 with her insurance. She understands that after today, [facility] can mail her prescriptions to her.    I will see Melissa [name] back in 1 month for her SCP visit.

## 2020-03-22 ENCOUNTER — Telehealth: Payer: Self-pay | Admitting: Adult Health

## 2020-03-22 ENCOUNTER — Telehealth (HOSPITAL_COMMUNITY): Payer: Self-pay | Admitting: Pharmacy Technician

## 2020-03-22 ENCOUNTER — Telehealth: Payer: Self-pay | Admitting: *Deleted

## 2020-03-22 NOTE — Telephone Encounter (Signed)
No 1/10 LOS. No changes made to pt's schedule.

## 2020-03-22 NOTE — Telephone Encounter (Signed)
Patient Advocate Encounter   Received notification from Medicaid that prior authorization for [place] is required.   PA submitted on CoverMyMeds Key [id number] W Status is pending   Will continue to follow.

## 2020-03-22 NOTE — Telephone Encounter (Signed)
Received VM from pt with com plaint of bilateral leg bone pain and rib pain. Pt examined in office yesterday by Wilber Bihari, NP.  Per Mendel Ryder, pt to take OTC tylenol to alleviate discomfort until bone scan results come back.  RN attempt x1 to return call to pt.  No answer, LVM to return call to the office.

## 2020-03-23 ENCOUNTER — Telehealth: Payer: Self-pay | Admitting: Hematology and Oncology

## 2020-03-23 NOTE — Telephone Encounter (Signed)
Scheduled appt per 11/1 sch msg - pt is aware of appt date and time.  ° °

## 2020-03-30 ENCOUNTER — Encounter: Payer: Self-pay | Admitting: Licensed Clinical Social Worker

## 2020-03-30 NOTE — Progress Notes (Signed)
[place] CSW Progress Note  Clinical Education officer, museum received TC from patient. She is anxious and worried about possible recurrence since she had bone pain and is scheduled for scans tomorrow. She is trying to remind herself of the possible non-recurrence reasons for the pain she had. CSW provided empathic listening, normalized feelings, and explored ways to manage anxiety. Patient will try to remind herself that she cannot do anything about the outcome of the scans right now and focus on enjoying time with her children today.  Patient may call CSW as needed and plans to check-in again next week after receiving results from scans.    Christeen Douglas , LCSW

## 2020-03-31 ENCOUNTER — Other Ambulatory Visit: Payer: Self-pay

## 2020-03-31 ENCOUNTER — Ambulatory Visit (HOSPITAL_COMMUNITY)
Admission: RE | Admit: 2020-03-31 | Discharge: 2020-03-31 | Disposition: A | Discharge status: Home / Self Care | Payer: Medicaid Other | Source: Ambulatory Visit | Attending: Adult Health | Admitting: Adult Health

## 2020-03-31 ENCOUNTER — Encounter (HOSPITAL_COMMUNITY)
Admission: RE | Admit: 2020-03-31 | Discharge: 2020-03-31 | Disposition: A | Discharge status: Home / Self Care | Payer: Medicaid Other | Source: Ambulatory Visit | Attending: Adult Health | Admitting: Adult Health

## 2020-03-31 DIAGNOSIS — Z170 Estrogen receptor positive status [ER+]: Secondary | ICD-10-CM

## 2020-03-31 DIAGNOSIS — C50812 Malignant neoplasm of overlapping sites of left female breast: Secondary | ICD-10-CM

## 2020-03-31 IMAGING — NM NM BONE WHOLE BODY
2 series · 2 of 2 positions shown · non-contrast
Comparison: NM bone scan [DATE] and CT CAP [DATE]

CLINICAL DATA: Breast cancer.  Assess treatment response.

EXAM:
NUCLEAR MEDICINE WHOLE BODY BONE SCAN
TECHNIQUE: Whole body anterior and posterior images were obtained approximately
3 hours after intravenous injection of radiopharmaceutical.
RADIOPHARMACEUTICALS:  19.3 mCi [BV] MDP IV

[Series 1: whole body · 2.66mm/px · 1 of 1 slices shown (1 of 2)]
[im 1/1]
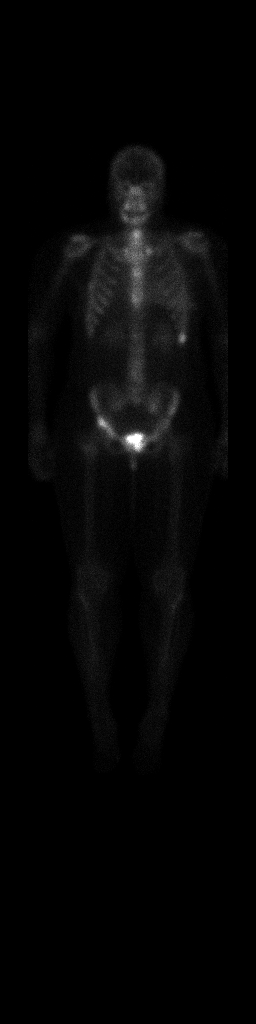

[Series 1: whole body · 2.66mm/px · 1 of 1 slices shown (2 of 2)]
[im 1/1]
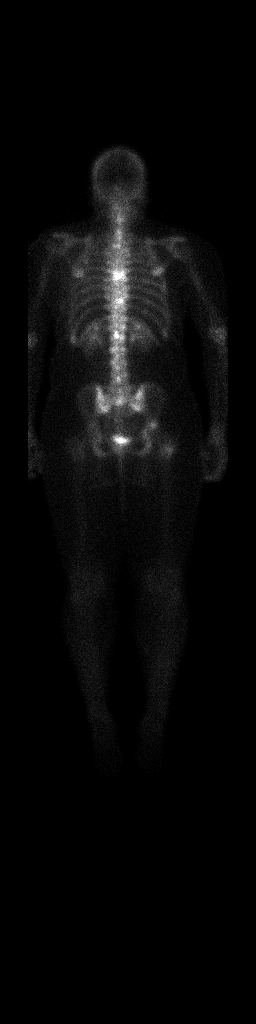

[2 of 2 positions shown; findings below may reference images not displayed]

FINDINGS: Multifocal abnormal areas of increased radiotracer uptake are
identified compatible with bone metastases. Abnormal areas of
increased tracer uptake are noted within the bony pelvis including:
Right acetabulum, left pubic bone, and anterior left lower rib.
Multifocal areas of increased uptake are also noted within the
thoracic and upper lumbar spine including the T6, T9, and L1
vertebra. Increased radiotracer uptake is also noted within the
intertrochanteric regions of the proximal right femur.
IMPRESSION: 1. Interval development of multifocal abnormal areas of increased
radiotracer uptake compatible with diffuse bone metastases. This is
a new finding when compared with bone scan from [DATE]. On the
corresponding CT images from today there are scattered and
progressive lytic bone metastases.

## 2020-03-31 IMAGING — CT CT CHEST W/ CM
2 of 4 series · 12 of 36 positions shown, 15 images · IV contrast (APPLIED)
Comparison: [DATE]

CLINICAL DATA: Breast cancer.  Restaging.

EXAM:
CT CHEST, ABDOMEN, AND PELVIS WITH CONTRAST
TECHNIQUE: Multidetector CT imaging of the chest, abdomen and pelvis was
performed following the standard protocol during bolus
administration of intravenous contrast.
CONTRAST:  100mL OMNIPAQUE IOHEXOL 300 MG/ML  SOLN

[Series 2: cap with · axial · 0.71mm/px · z∈[-321,+179]mm · 9 of 124 slices shown, 12 images]
[im 12/124  mediastinal]
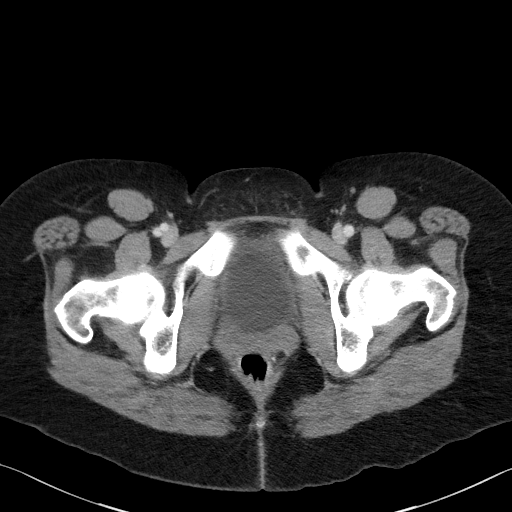
[im 12/124  lung]
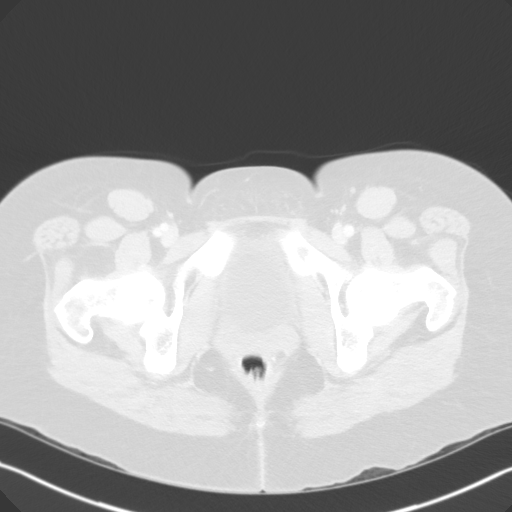
[im 23/124  lung]
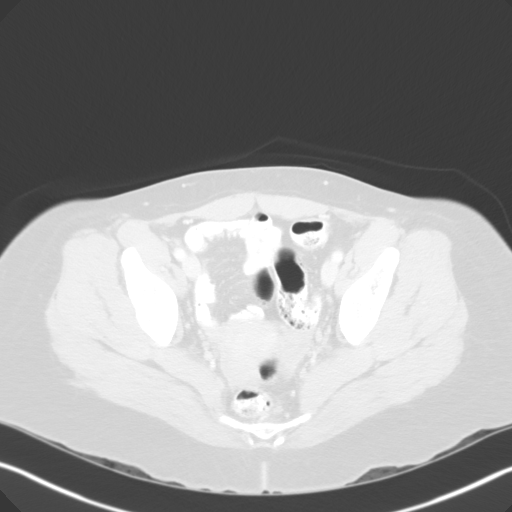
[im 34/124  lung]
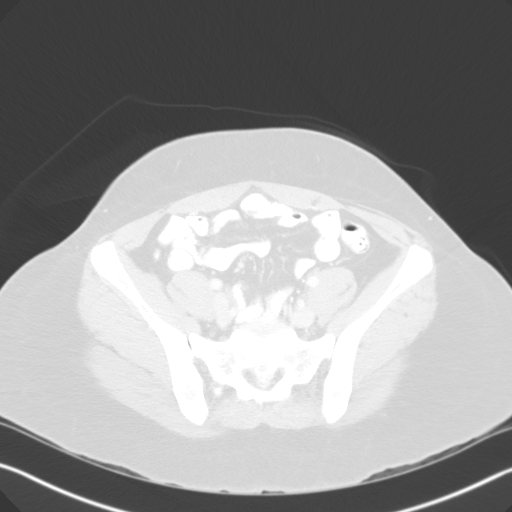
[im 45/124  lung]
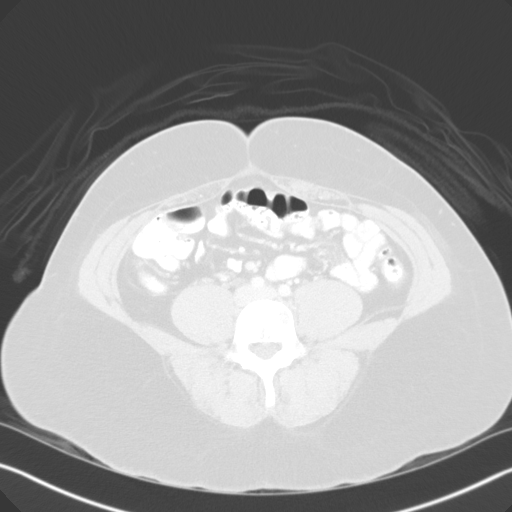
[im 68/124  mediastinal]
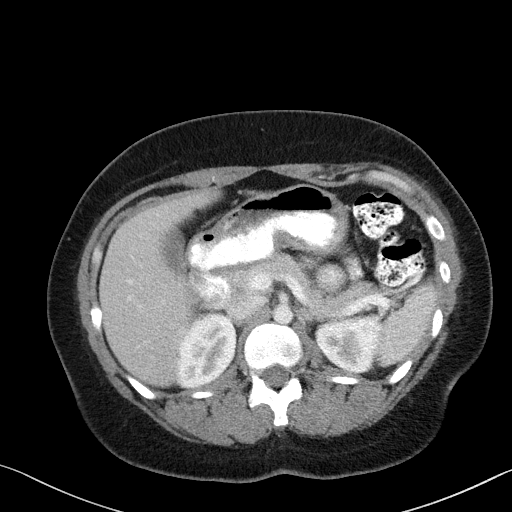
[im 68/124  lung]
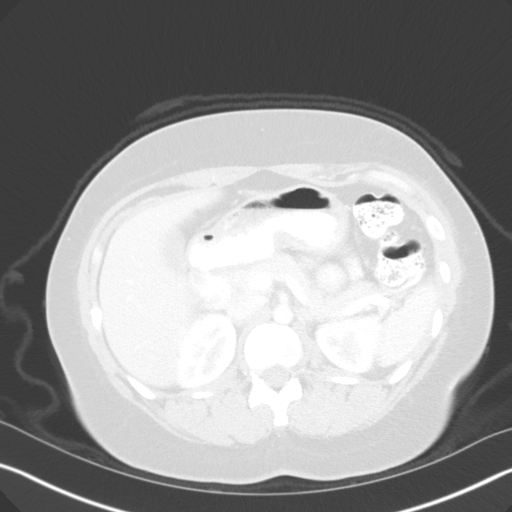
[im 79/124  lung]
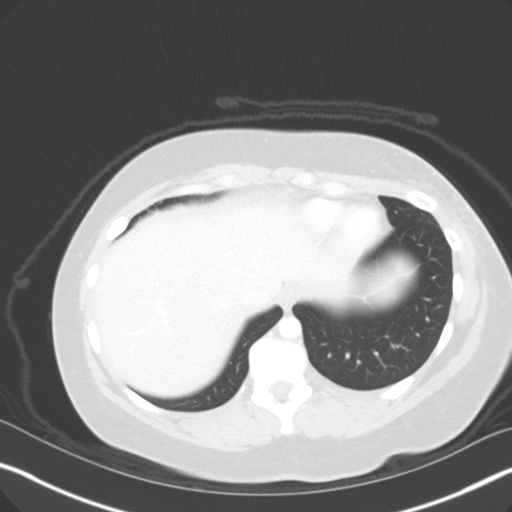
[im 90/124  lung]
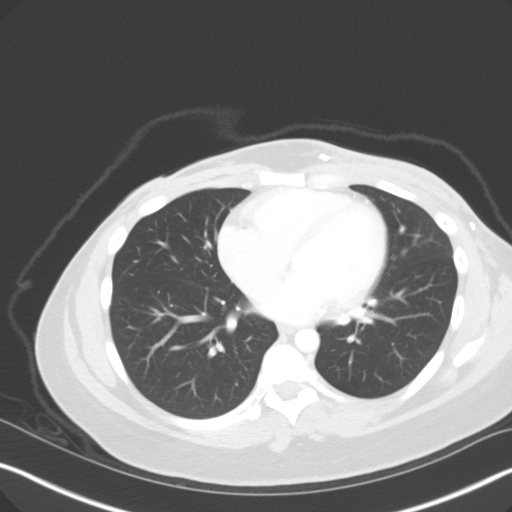
[im 101/124  lung]
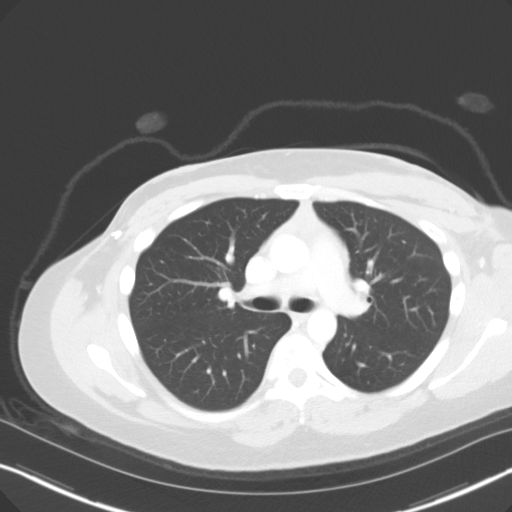
[im 112/124  mediastinal]
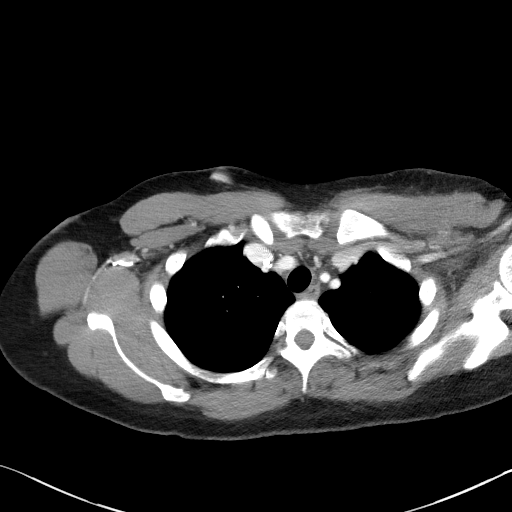
[im 112/124  lung]
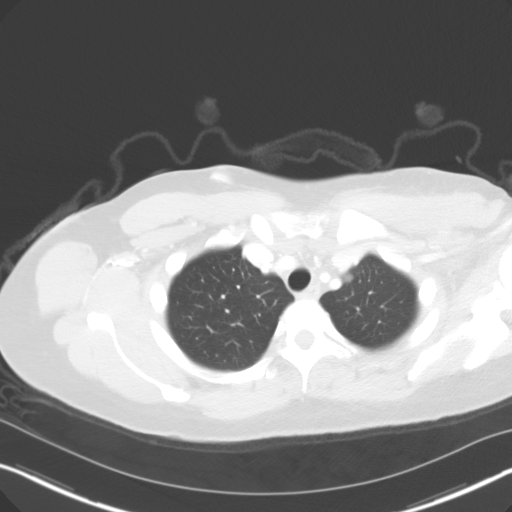

[Series 5: coronals · coronal · 0.68mm/px · 3 of 120 slices shown]
[im 24/120  lung]
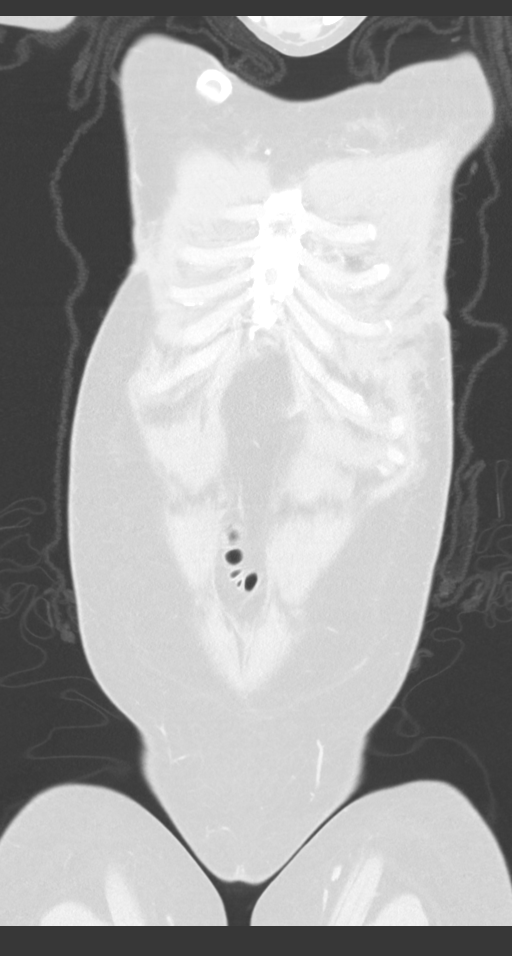
[im 48/120  lung]
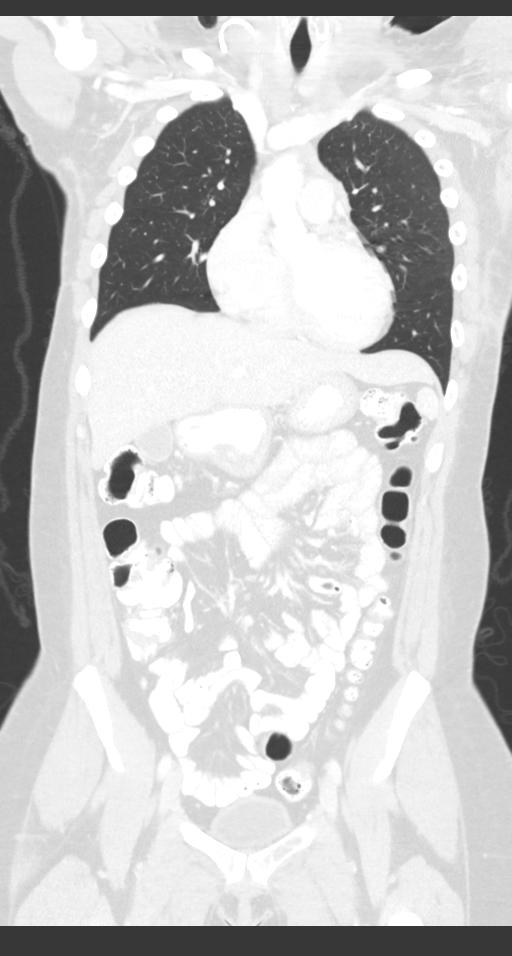
[im 72/120  lung]
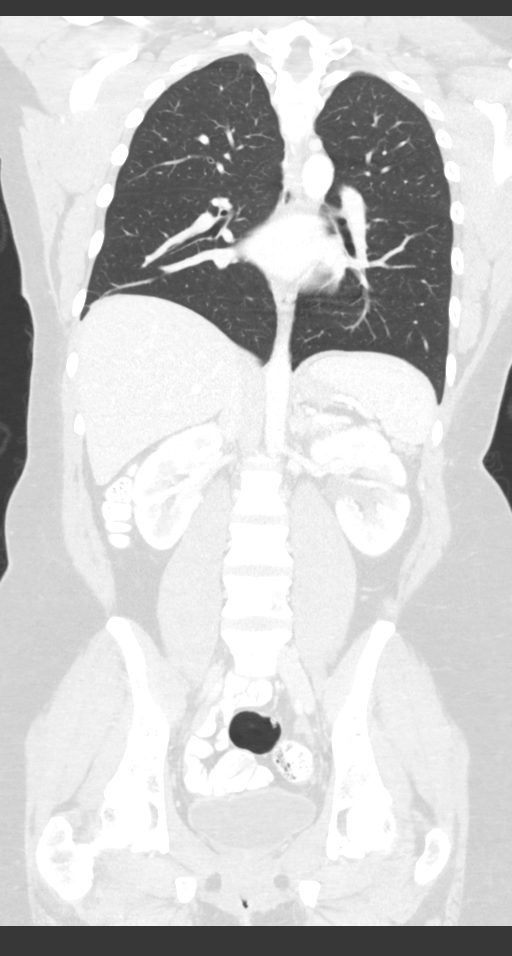

[12 of 36 positions shown; findings below may reference images not displayed]

FINDINGS: CT CHEST FINDINGS

Cardiovascular: The heart size is normal. No substantial pericardial
effusion. No thoracic aortic aneurysm. Right Port-A-Cath tip is
positioned in the proximal SVC.

Mediastinum/Nodes: No mediastinal lymphadenopathy. There is no hilar
lymphadenopathy. The esophagus has normal imaging features. Surgical
clips noted in both axillary regions.

Lungs/Pleura: No suspicious pulmonary nodule or mass. No focal
airspace consolidation. No pleural effusion.

Musculoskeletal: Progression of the destructive lesion identified in
the T6 vertebral body. This lesion destroys the posterior cortex on
the right and there is evidence of epidural tumor extension (see
image 26 of axial series 2). Interval progression of mixed lytic and
sclerotic lesion in the T9 vertebral body, consistent with
metastatic disease. Lucency in the right T12 pedicle is suspicious
for metastatic involvement. Post treatment changes noted left
anterior chest wall. Bilateral mastectomy.

CT ABDOMEN PELVIS FINDINGS

Hepatobiliary: No suspicious focal abnormality within the liver
parenchyma. There is no evidence for gallstones, gallbladder wall
thickening, or pericholecystic fluid. No intrahepatic or
extrahepatic biliary dilation.

Pancreas: No focal mass lesion. No dilatation of the main duct. No
intraparenchymal cyst. No peripancreatic edema.

Spleen: No splenomegaly. No focal mass lesion.

Adrenals/Urinary Tract: No adrenal nodule or mass. Kidneys
unremarkable. No evidence for hydroureter. The urinary bladder
appears normal for the degree of distention.

Stomach/Bowel: Stomach is unremarkable. No gastric wall thickening.
No evidence of outlet obstruction. Duodenum is normally positioned
as is the ligament of Treitz. No small bowel wall thickening. No
small bowel dilatation. The terminal ileum is normal. The appendix
is normal. No gross colonic mass. No colonic wall thickening.

Vascular/Lymphatic: No abdominal aortic aneurysm. No abdominal
aortic atherosclerotic calcification. There is no gastrohepatic or
hepatoduodenal ligament lymphadenopathy. No retroperitoneal or
mesenteric lymphadenopathy. No pelvic sidewall lymphadenopathy.

Reproductive: The uterus is unremarkable.  There is no adnexal mass.

Other: No intraperitoneal free fluid.

Musculoskeletal: Scattered lucent bone lesions are progressive in
the interval. Index 17 mm lesion in the L1 vertebral body (58/2) was
6 mm previously. 12 mm lucent lesion in the L4 vertebral body (81/2)
appears new since prior 19 mm lucent lesion in the sacrum (92/2 is
new. Irregular lytic lesion in the left pubic bone measuring 2.3 cm
is new since prior.
IMPRESSION: 1. Interval progression of bony metastatic disease in the
thoracolumbar spine, sacrum, and bony pelvis. Progression of the
destructive lesion in the posterior T6 vertebral body now shows
destruction of the posterior cortex with evidence of epidural tumor
extension. Thoracic spine MRI with and without contrast recommended
to further evaluate.
2. No evidence for soft tissue metastases in the chest, abdomen, or
pelvis.

These results will be called to the ordering clinician or
representative by the Radiologist Assistant, and communication
documented in the PACS or [REDACTED].

## 2020-03-31 MED ORDER — TECHNETIUM TC 99M MEDRONATE IV KIT
19.3000 | PACK | Freq: Once | INTRAVENOUS | Status: AC
  Administered 2020-03-31: 19.3 via INTRAVENOUS

## 2020-03-31 MED ORDER — IOHEXOL 300 MG/ML  SOLN
100.0000 mL | Freq: Once | INTRAMUSCULAR | Status: AC | PRN
  Administered 2020-03-31: 100 mL via INTRAVENOUS

## 2020-04-01 ENCOUNTER — Other Ambulatory Visit: Payer: Self-pay | Admitting: Hematology and Oncology

## 2020-04-01 ENCOUNTER — Telehealth: Payer: Self-pay | Admitting: Adult Health

## 2020-04-01 DIAGNOSIS — C50919 Malignant neoplasm of unspecified site of unspecified female breast: Secondary | ICD-10-CM

## 2020-04-01 DIAGNOSIS — C50812 Malignant neoplasm of overlapping sites of left female breast: Secondary | ICD-10-CM

## 2020-04-01 NOTE — Telephone Encounter (Signed)
I called Melissa [name] today and gave her the results of her scans that show progressive bone metastases.  I let her know that I reivewed her scans with Dr. Lindi Adie and she needs to undergo thoracic spine MRI, and that Dr. Lindi Adie had already ordered this.    Melissa [name] was very tearful.  I let her know that the good news is that her disease is bone only and that there are treatments she can take that can help.  Once we get the MRI we will have more information on what her personalized plan will be.    Right now she is not having any pain.  She is tearful, and noted she will return in one week for f/u and discussion with Dr. Lindi Adie.

## 2020-04-06 NOTE — Progress Notes (Signed)
Patient Care Team: [name], NP as PCP - General (Nurse Practitioner) [name], RN as Oncology Nurse Navigator Rockwell Germany, RN as Oncology Nurse Navigator Rolm Bookbinder, MD as Surgeon (General Surgery) Nicholas Lose, MD as Medical Oncologist (Hematology and Oncology)  DIAGNOSIS:    ICD-10-CM   1. Malignant neoplasm of overlapping sites of left breast in female, estrogen receptor positive ([place])  C50.812    Z17.0     SUMMARY OF ONCOLOGIC HISTORY: Oncology History  Malignant neoplasm of overlapping sites of left breast in female, estrogen receptor positive ([place])  02/26/2019 Initial Diagnosis   Pregnant lady with 65-month history of left breast swelling and skin thickening, mammogram showed pleomorphic calcifications 8 cm, axilla lymph node biopsy positive ER 60%, PR 60%, Ki-67 50%, HER-2 negative ratio 1.25, anterior and posterior end of the calcifications biopsied: 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 ER 50%, PR 30%, Ki-67 50%, HER-2 negative ratio 1.03   03/04/2019 Cancer Staging   Staging form: Breast, AJCC 8th Edition - Clinical stage from 03/04/2019: Stage IIIB (cT4, cN1, cM0, G3, ER+, PR+, HER2-) - Signed by Nicholas Lose, MD on 03/04/2019    Neo-Adjuvant Chemotherapy   Adriamycin and Cytoxan x 4 given every three weeks without Neulasta support., followed by weekly Taxol x 12 versus Taxotere Cytoxan after her delivery   04/15/2019 Genetic Testing   Negative genetic testing:  No pathogenic variants detected on the [facility] Breast Cancer Guidelines-Based Panel or the Common Hereditary Cancers Panel. The report date is 04/15/2019.  The Breast Cancer Guidelines-Based panel offered by [facility] includes sequencing and rearrangement analysis for the following 11 genes:  ATM, BRCA1, BRCA2, CDH1, CHEK2, NBN, NF1, PALB2, PTEN, STK11 and TP53.  The Common Hereditary Cancers Panel offered by [facility] includes sequencing and/or deletion duplication testing of the  following 48 genes: APC, ATM, AXIN2, BARD1, BMPR1A, BRCA1, BRCA2, BRIP1, CDH1, CDK4, CDKN2A (p14ARF), CDKN2A (p16INK4a), CHEK2, CTNNA1, DICER1, EPCAM (Deletion/duplication testing only), GREM1 (promoter region deletion/duplication testing only), KIT, MEN1, MLH1, MSH2, MSH3, MSH6, MUTYH, NBN, NF1, NHTL1, PALB2, PDGFRA, PMS2, POLD1, POLE, PTEN, RAD50, RAD51C, RAD51D, RNF43, SDHB, SDHC, SDHD, SMAD4, SMARCA4. STK11, TP53, TSC1, TSC2, and VHL.  The following genes were evaluated for sequence changes only: SDHA and HOXB13 c.251G>A variant only.    10/08/2019 Surgery   Bilateral mastectomies [name]):  Right mastectomy: Multifocal grade 2 IDC 0.8 cm and 0.6 cm with high-grade DCIS, margins negative, 0/4 lymph nodes ER 95%, PR 5%, Ki-67 10%, HER-2 negative;  Left mastectomy: IDC 0.6 cm, LVI, margins negative, 1/4 lymph nodes positive, ER 60%, PR 60%, HER-2 negative, Ki-67 50% RCB class II   11/30/2019 - 01/08/2020 Radiation Therapy   Adjuvant radiation     CHIEF COMPLIANT: Follow-up to discuss treatment for newly metastatic breast cancer  INTERVAL HISTORY: Melissa [name] is a 30 y.o. with above-mentioned history of breast cancerwho completedneoadjuvant chemotherapy, bilateral mastectomies, radiation, and is currently on antiestrogen therapy with Zoladex and anastrozole.CT CAP on 03/31/20 showed progression of osseous metastatic disease in the thoracolumbar spine, sacrum, and bony pelvis and no evidence of soft tissue metastases. Bone scan on 03/31/20 showed diffuse bony metastases. She presents to the clinic todayto discuss treatment options.   ALLERGIES:  has No Known Allergies.  MEDICATIONS:  Current Outpatient Medications  Medication Sig Dispense Refill  . anastrozole (ARIMIDEX) 1 MG tablet Take 1 tablet (1 mg total) by mouth daily. 90 tablet 3  . carvedilol (COREG) 6.25 MG tablet Take 1 tablet (6.25 mg total)  by mouth 2 (two) times daily. 60 tablet 6  . dapagliflozin propanediol (FARXIGA) 10 MG  TABS tablet Take 1 tablet (10 mg total) by mouth daily before breakfast. 30 tablet 11  . furosemide (LASIX) 20 MG tablet Take 1 tablet (20 mg total) by mouth daily as needed for fluid or edema. 90 tablet 2  . gabapentin (NEURONTIN) 100 MG capsule Take 1 capsule (100 mg total) by mouth at bedtime. 30 capsule 0  . methocarbamol (ROBAXIN) 500 MG tablet Take 1 tablet (500 mg total) by mouth 3 (three) times daily. 30 tablet 1  . sacubitril-valsartan (ENTRESTO) 97-103 MG Take 1 tablet by mouth 2 (two) times daily. 60 tablet 11  . traMADol (ULTRAM) 50 MG tablet Take 50 mg by mouth every 6 (six) hours as needed.     No current facility-administered medications for this visit.    PHYSICAL EXAMINATION: ECOG PERFORMANCE STATUS: 1 - Symptomatic but completely ambulatory  Vitals:   04/07/20 1010  BP: (!) 111/52  Pulse: 72  Resp: 17  Temp: 97.7 F (36.5 C)  SpO2: 100%   Filed Weights   04/07/20 1010  Weight: 137 lb (62.1 kg)    LABORATORY DATA:  I have reviewed the data as listed CMP Latest Ref Rng & Units 10/08/2019 09/30/2019 09/18/2019  Glucose 70 - 99 mg/dL 94 100(H) 91  BUN 6 - 20 mg/dL $Remove'19 14 13  'oEMytRO$ Creatinine 0.44 - 1.00 mg/dL 0.90 0.84 0.79  Sodium 135 - 145 mmol/L 138 138 140  Potassium 3.5 - 5.1 mmol/L 3.9 4.5 4.0  Chloride 98 - 111 mmol/L 99 102 107  CO2 22 - 32 mmol/L - 28 24  Calcium 8.9 - 10.3 mg/dL - 9.3 9.5  Total Protein 6.5 - 8.1 g/dL - 7.3 6.6  Total Bilirubin 0.3 - 1.2 mg/dL - 0.6 0.4  Alkaline Phos 38 - 126 U/L - 59 56  AST 15 - 41 U/L - 22 18  ALT 0 - 44 U/L - 12 13    Lab Results  Component Value Date   WBC 4.9 09/30/2019   HGB 9.9 (L) 10/08/2019   HCT 29.0 (L) 10/08/2019   MCV 98.0 09/30/2019   PLT 283 09/30/2019   NEUTROABS 2.0 09/18/2019    ASSESSMENT & PLAN:  Malignant neoplasm of overlapping sites of left breast in female, estrogen receptor positive ([place]) 02/26/2019:Pregnant lady with 28-month history of left breast swelling and skin thickening, mammogram  showed pleomorphic calcifications 8 cm, axilla lymph node biopsy positive ER 60%, PR 60%, Ki-67 50%, HER-2 negative ratio 1.25, anterior and posterior end of the calcifications biopsied: 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 Neoadjuvant chemotherapy with dose dense Adriamycin and Cytoxan given every 3 weeks without Neulasta support given her pregnancycompleted 05/28/2019 2. Post-Partumweekly Taxol x12starting 07/03/2019 3. Bilateral mastectomy 10/08/2019: Right mastectomy: Multifocal grade 2 IDC 0.8 cm and 0.6 cm with high-grade DCIS, margins negative, 0/4lymph nodes ER 95%, PR 5%, Ki-67 10%, HER-2 negative;left mastectomy: IDC 0.6 cm, LVI, margins negative, 1/4 lymph nodes positive, ER 60%, PR 60%, HER-2 negative, Ki-67 50% RCB class II 4. Adjuvant radiation therapy 5. Followed by adjuvant antiestrogen therapy with complete estrogen blockade (ovarian suppression with AI) CT C/A/P on 5/13 shows sclerotic bone lesions, bone scan negative 6.Declined CDK 4 and 6 inhibitor abemaciclibwith antiestrogen therapy ----------------------------------------------------------------------------------------------------------------------------------------------------  Bone scan 04/01/2020: Interval development of multifocal abnormal areas of increased tracer uptake compatible with diffuse bone metastases bony pelvis, right acetabulum, left pubic bone, anterior left lower rib, thoracic and upper lumbar spine including T6, T9, L1, intertrochanteric region of right femur  03/31/2020: CT CAP: Bone metastases.  No visceral mets.  Treatment plan: Faslodex + letrozole + Ibrance plus Zoladex along with [place] Bad: I discussed the risks and benefits of Ibrance including myelosuppression especially  neutropenia and with that risk of infection, there is risk of pulmonary embolism and mild peripheral neuropathy as well. Fatigue, nausea, diarrhea, decreased appetite as well as alopecia and thrombocytopenia are also potential side effects of Ibrance  Return to clinic in 3 weeks for labs and follow-up. She will receive Xgeva along with Zoladex.    No orders of the defined types were placed in this encounter.  The patient has a good understanding of the overall plan. she agrees with it. she will call with any problems that may develop before the next visit here.  Total time spent: 45 mins including face to face time and time spent for planning, charting and coordination of care  Nicholas Lose, MD 04/07/2020  I, Cloyde Reams Dorshimer, am acting as scribe for Dr. Nicholas Lose.  I have reviewed the above documentation for accuracy and completeness, and I agree with the above.

## 2020-04-06 NOTE — Assessment & Plan Note (Signed)
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 Neoadjuvant chemotherapy with dose dense Adriamycin and Cytoxan given every 3 weeks without Neulasta support given her pregnancycompleted 05/28/2019 2. Post-Partumweekly Taxol x12starting 07/03/2019 3. Bilateral mastectomy 10/08/2019: Right mastectomy: Multifocal grade 2 IDC 0.8 cm and 0.6 cm with high-grade DCIS, margins negative, 0/4lymph nodes ER 95%, PR 5%, Ki-67 10%, HER-2 negative;left mastectomy: IDC 0.6 cm, LVI, margins negative, 1/4 lymph nodes positive, ER 60%, PR 60%, HER-2 negative, Ki-67 50% RCB class II 4. Adjuvant radiation therapy 5. Followed by adjuvant antiestrogen therapy with complete estrogen blockade (ovarian suppression with AI) CT C/A/P on 5/13 shows sclerotic bone lesions, bone scan negative 6.Declined CDK 4 and 6 inhibitor abemaciclibwith antiestrogen therapy ---------------------------------------------------------------------------------------------------------------------------------------------------- Bone scan 04/01/2020: Interval development of multifocal abnormal areas of increased tracer uptake compatible with diffuse bone metastases bony pelvis, right acetabulum, left pubic bone, anterior left lower rib, thoracic and upper lumbar spine including T6, T9, L1, intertrochanteric region of right femur  03/31/2020: CT CAP: Bone metastases.  No visceral mets.  Treatment plan: Faslodex + letrozole + Ibrance plus Zoladex along with  [place]: I discussed the risks and benefits of Ibrance including myelosuppression especially neutropenia and with that risk of infection, there is risk of pulmonary embolism and mild peripheral neuropathy as well. Fatigue, nausea, diarrhea, decreased appetite as well as alopecia and thrombocytopenia are also potential side effects of Ibrance   I discussed with her about providing blood sample for the metastatic breast cancer blood collection study.

## 2020-04-07 ENCOUNTER — Telehealth: Payer: Self-pay

## 2020-04-07 ENCOUNTER — Inpatient Hospital Stay (HOSPITAL_BASED_OUTPATIENT_CLINIC_OR_DEPARTMENT_OTHER): Payer: Medicaid Other | Admitting: Hematology and Oncology

## 2020-04-07 ENCOUNTER — Other Ambulatory Visit: Payer: Self-pay

## 2020-04-07 ENCOUNTER — Telehealth: Payer: Self-pay | Admitting: Pharmacist

## 2020-04-07 DIAGNOSIS — C50812 Malignant neoplasm of overlapping sites of left female breast: Secondary | ICD-10-CM

## 2020-04-07 DIAGNOSIS — Z5111 Encounter for antineoplastic chemotherapy: Secondary | ICD-10-CM | POA: Diagnosis not present

## 2020-04-07 DIAGNOSIS — Z170 Estrogen receptor positive status [ER+]: Secondary | ICD-10-CM | POA: Diagnosis not present

## 2020-04-07 DIAGNOSIS — C50919 Malignant neoplasm of unspecified site of unspecified female breast: Secondary | ICD-10-CM

## 2020-04-07 MED ORDER — PALBOCICLIB 125 MG PO TABS: 125.0000 mg | ORAL_TABLET | Freq: Every day | ORAL | 6 refills | Status: DC

## 2020-04-07 MED ORDER — LETROZOLE 2.5 MG PO TABS: 2.5000 mg | ORAL_TABLET | Freq: Every day | ORAL | 3 refills | Status: DC

## 2020-04-07 MED ORDER — PALBOCICLIB 125 MG PO CAPS: 125.0000 mg | ORAL_CAPSULE | Freq: Every day | ORAL | 6 refills | Status: DC

## 2020-04-07 NOTE — Telephone Encounter (Signed)
Oral Oncology Patient Advocate Encounter  After completing a benefits investigation, prior authorization for Melissa [name] is not required at this time through [facility].  Patient's copay is $3.     [place] Patient [place] Phone [phone] Fax [phone] 04/07/2020 12:14 PM

## 2020-04-08 ENCOUNTER — Telehealth: Payer: Self-pay | Admitting: Licensed Clinical Social Worker

## 2020-04-08 MED FILL — IBRANCE 125 MG TABS: 125 | 28 days supply | Qty: 21 | Fill #0

## 2020-04-08 NOTE — Telephone Encounter (Signed)
Oral Oncology Pharmacist Encounter  Received new prescription for Ibrance (palbociclib) for the treatment of metastatic HR positive, HER-2 negative breast cancer in conjunction with letrozole and Zoladex, planned duration until disease progression or unacceptable drug toxicity.  Prescription dose and frequency assessed for appropriateness.  Patient will have baseline labs performed on 04/14/20 - OK per MD to proceed with treatment at this time.   Current medication list in Epic reviewed, no relevant/significant DDIs with Ibrance identified.  Evaluated chart and no patient barriers to medication adherence noted.   Prescription has been e-scribed to the [facility] for benefits analysis and approval.  Oral Oncology Clinic will continue to follow for insurance authorization, copayment issues, initial counseling and start date.  Leron Croak, PharmD, BCPS Hematology/Oncology Clinical Pharmacist [facility] [phone] 04/08/2020 8:03 AM

## 2020-04-08 NOTE — Telephone Encounter (Signed)
TC to patient for ongoing support around new metastatic diagnosis. Patient unable to talk at this time. Stated she will call back later.   Melissa Douglas, LCSW

## 2020-04-08 NOTE — Telephone Encounter (Signed)
Oral Chemotherapy Pharmacist Encounter  I spoke with patient for overview of: Ibrance (palbociclib) for the treatment of metastatic, hormone-receptor positive, HER2 receptor negative breast cancer, in combination with letrozole and Zoladex, planned duration until disease progression or unacceptable toxicity.   Counseled patient on administration, dosing, side effects, monitoring, drug-food interactions, safe handling, storage, and disposal.  Patient will take Ibrance $RemoveBefo'125mg'cQLMkUmbuvl$  tablets, 1 tablet by mouth once daily, with or without food, taken for 3 weeks on, 1 week off, and repeated.  Patient knows to avoid grapefruit and grapefruit juice while on treatment with Ibrance.  Patient is taking letrozole 2.5 mg tablets once daily.  Ibrance start date: 04/09/20  Adverse effects include but are not limited to: fatigue, hair loss, GI upset, nausea, decreased blood counts, and increased upper respiratory infections.  Severe, life-threatening, and/or fatal interstitial lung disease (ILD) and/or pneumonitis may occur with CDK 4/6 inhibitors.  Patient will obtain anti diarrheal and alert the office of 4 or more loose stools above baseline.  Patient reminded of WBC check on Cycle 1 Day 14 for dose and ANC assessment.  Reviewed with patient importance of keeping a medication schedule and plan for any missed doses. No barriers to medication adherence identified.  Medication reconciliation performed and medication/allergy list updated.  Insurance authorization for Melissa [name] has been obtained. Test claim at the pharmacy revealed copayment $3 for 1st fill of Ibrance. Patient will pick this up from the [facility] on 04/08/20.  Patient informed the pharmacy will reach out 5-7 days prior to needing next fill of Ibrance to coordinate continued medication acquisition to prevent break in therapy.  All questions answered.  Melissa [name] voiced understanding and appreciation.   Medication  education handout placed in mail for patient. Patient knows to call the office with questions or concerns. Oral Chemotherapy Clinic phone number provided to patient.   Leron Croak, PharmD, BCPS Hematology/Oncology Clinical Pharmacist [facility] [phone] 04/08/2020 10:55 AM

## 2020-04-11 ENCOUNTER — Encounter: Payer: Self-pay | Admitting: Licensed Clinical Social Worker

## 2020-04-11 NOTE — Progress Notes (Signed)
[place] CSW Progress Note  Clinical Education officer, museum contacted patient by phone to provide ongoing coping support. Patient continues to process new metastatic diagnosis. She is trying to keep hope that there are treatments and she will still be able to live for a long time. She looked up success stories of people who took [place] which was helpful for her.  Does have worries about dying and leaving her kids. CSW normalized and began discussion on ways to sit with uncertainty and fear while still living her life.  Patient relayed that her primary goal is to be there for her girls and see them grow and reach different milestones.   CSW will check-in with patient by phone in 2 weeks. Patient may call as needed in-between scheduled calls.    Christeen Douglas , LCSW

## 2020-04-14 ENCOUNTER — Inpatient Hospital Stay: Payer: Medicaid Other

## 2020-04-14 ENCOUNTER — Other Ambulatory Visit: Payer: Self-pay

## 2020-04-14 ENCOUNTER — Inpatient Hospital Stay (HOSPITAL_BASED_OUTPATIENT_CLINIC_OR_DEPARTMENT_OTHER): Payer: Medicaid Other | Admitting: Medical

## 2020-04-14 ENCOUNTER — Inpatient Hospital Stay: Payer: Medicaid Other | Attending: Hematology and Oncology

## 2020-04-14 ENCOUNTER — Inpatient Hospital Stay: Payer: Medicaid Other | Admitting: Adult Health

## 2020-04-14 VITALS — BP 98/68 | HR 56 | Temp 98.2°F | Resp 18

## 2020-04-14 DIAGNOSIS — Z170 Estrogen receptor positive status [ER+]: Secondary | ICD-10-CM | POA: Insufficient documentation

## 2020-04-14 DIAGNOSIS — T451X5A Adverse effect of antineoplastic and immunosuppressive drugs, initial encounter: Secondary | ICD-10-CM | POA: Diagnosis not present

## 2020-04-14 DIAGNOSIS — Z9013 Acquired absence of bilateral breasts and nipples: Secondary | ICD-10-CM | POA: Diagnosis not present

## 2020-04-14 DIAGNOSIS — C50812 Malignant neoplasm of overlapping sites of left female breast: Secondary | ICD-10-CM | POA: Insufficient documentation

## 2020-04-14 DIAGNOSIS — Z7984 Long term (current) use of oral hypoglycemic drugs: Secondary | ICD-10-CM | POA: Insufficient documentation

## 2020-04-14 DIAGNOSIS — C7951 Secondary malignant neoplasm of bone: Secondary | ICD-10-CM | POA: Insufficient documentation

## 2020-04-14 DIAGNOSIS — D701 Agranulocytosis secondary to cancer chemotherapy: Secondary | ICD-10-CM | POA: Diagnosis not present

## 2020-04-14 DIAGNOSIS — Z79899 Other long term (current) drug therapy: Secondary | ICD-10-CM | POA: Diagnosis not present

## 2020-04-14 DIAGNOSIS — Z923 Personal history of irradiation: Secondary | ICD-10-CM | POA: Insufficient documentation

## 2020-04-14 DIAGNOSIS — Z95828 Presence of other vascular implants and grafts: Secondary | ICD-10-CM

## 2020-04-14 DIAGNOSIS — Z5111 Encounter for antineoplastic chemotherapy: Secondary | ICD-10-CM | POA: Insufficient documentation

## 2020-04-14 LAB — CBC WITH DIFFERENTIAL (CANCER CENTER ONLY)
Abs Immature Granulocytes: 0.01 10*3/uL (ref 0.00–0.07)
Basophils Absolute: 0 10*3/uL (ref 0.0–0.1)
Basophils Relative: 1 %
Eosinophils Absolute: 0.1 10*3/uL (ref 0.0–0.5)
Eosinophils Relative: 3 %
HCT: 39.1 % (ref 36.0–46.0)
Hemoglobin: 12.6 g/dL (ref 12.0–15.0)
Immature Granulocytes: 0 %
Lymphocytes Relative: 26 %
Lymphs Abs: 1.1 10*3/uL (ref 0.7–4.0)
MCH: 28.5 pg (ref 26.0–34.0)
MCHC: 32.2 g/dL (ref 30.0–36.0)
MCV: 88.5 fL (ref 80.0–100.0)
Monocytes Absolute: 0.2 10*3/uL (ref 0.1–1.0)
Monocytes Relative: 4 %
Neutro Abs: 2.8 10*3/uL (ref 1.7–7.7)
Neutrophils Relative %: 66 %
Platelet Count: 193 10*3/uL (ref 150–400)
RBC: 4.42 MIL/uL (ref 3.87–5.11)
RDW: 15.4 % (ref 11.5–15.5)
WBC Count: 4.2 10*3/uL (ref 4.0–10.5)
nRBC: 0 % (ref 0.0–0.2)

## 2020-04-14 LAB — CMP (CANCER CENTER ONLY)
ALT: 32 U/L (ref 0–44)
AST: 65 U/L — ABNORMAL HIGH (ref 15–41)
Albumin: 4.2 g/dL (ref 3.5–5.0)
Alkaline Phosphatase: 99 U/L (ref 38–126)
Anion gap: 7 (ref 5–15)
BUN: 19 mg/dL (ref 6–20)
CO2: 26 mmol/L (ref 22–32)
Calcium: 9.3 mg/dL (ref 8.9–10.3)
Chloride: 105 mmol/L (ref 98–111)
Creatinine: 1 mg/dL (ref 0.44–1.00)
GFR, Estimated: >60 mL/min (ref >60)
Glucose, Bld: 101 mg/dL — ABNORMAL HIGH (ref 70–99)
Potassium: 4.1 mmol/L (ref 3.5–5.1)
Sodium: 138 mmol/L (ref 135–145)
Total Bilirubin: 0.6 mg/dL (ref 0.3–1.2)
Total Protein: 7.9 g/dL (ref 6.5–8.1)

## 2020-04-14 MED ORDER — DENOSUMAB 120 MG/1.7ML ~~LOC~~ SOLN
120.0000 mg | Freq: Once | SUBCUTANEOUS | Status: AC
  Administered 2020-04-14: 120 mg via SUBCUTANEOUS

## 2020-04-14 MED ORDER — GOSERELIN ACETATE 3.6 MG ~~LOC~~ IMPL
3.6000 mg | DRUG_IMPLANT | Freq: Once | SUBCUTANEOUS | Status: AC
  Administered 2020-04-14: 3.6 mg via SUBCUTANEOUS

## 2020-04-14 MED ORDER — DENOSUMAB 120 MG/1.7ML ~~LOC~~ SOLN
SUBCUTANEOUS | Status: AC
  Filled 2020-04-14: qty 1.7

## 2020-04-14 MED ORDER — GOSERELIN ACETATE 3.6 MG ~~LOC~~ IMPL
DRUG_IMPLANT | SUBCUTANEOUS | Status: AC
  Filled 2020-04-14: qty 3.6

## 2020-04-14 NOTE — Patient Instructions (Signed)
Goserelin injection What is this medicine? GOSERELIN (GOE se rel in) is similar to a hormone found in the body. It lowers the amount of sex hormones that the body makes. Men will have lower testosterone levels and women will have lower estrogen levels while taking this medicine. In men, this medicine is used to treat prostate cancer; the injection is either given once per month or once every 12 weeks. A once per month injection (only) is used to treat women with endometriosis, dysfunctional uterine bleeding, or advanced breast cancer. This medicine may be used for other purposes; ask your health care provider or pharmacist if you have questions. COMMON BRAND NAME(S): Zoladex What should I tell my health care provider before I take this medicine? They need to know if you have any of these conditions:  bone problems  diabetes  heart disease  history of irregular heartbeat  an unusual or allergic reaction to goserelin, other medicines, foods, dyes, or preservatives  pregnant or trying to get pregnant  breast-feeding How should I use this medicine? This medicine is for injection under the skin. It is given by a health care professional in a hospital or clinic setting. Talk to your pediatrician regarding the use of this medicine in children. Special care may be needed. Overdosage: If you think you have taken too much of this medicine contact a poison control center or emergency room at once. NOTE: This medicine is only for you. Do not share this medicine with others. What if I miss a dose? It is important not to miss your dose. Call your doctor or health care professional if you are unable to keep an appointment. What may interact with this medicine? Do not take this medicine with any of the following medications:  cisapride  dronedarone  pimozide  thioridazine This medicine may also interact with the following medications:  other medicines that prolong the QT interval (an abnormal  heart rhythm) This list may not describe all possible interactions. Give your health care provider a list of all the medicines, herbs, non-prescription drugs, or dietary supplements you use. Also tell them if you smoke, drink alcohol, or use illegal drugs. Some items may interact with your medicine. What should I watch for while using this medicine? Visit your doctor or health care provider for regular checks on your progress. Your symptoms may appear to get worse during the first weeks of this therapy. Tell your doctor or healthcare provider if your symptoms do not start to get better or if they get worse after this time. Your bones may get weaker if you take this medicine for a long time. If you smoke or frequently drink alcohol you may increase your risk of bone loss. A family history of osteoporosis, chronic use of drugs for seizures (convulsions), or corticosteroids can also increase your risk of bone loss. Talk to your doctor about how to keep your bones strong. This medicine should stop regular monthly menstruation in women. Tell your doctor if you continue to menstruate. Women should not become pregnant while taking this medicine or for 12 weeks after stopping this medicine. Women should inform their doctor if they wish to become pregnant or think they might be pregnant. There is a potential for serious side effects to an unborn child. Talk to your health care professional or pharmacist for more information. Do not breast-feed an infant while taking this medicine. Men should inform their doctors if they wish to father a child. This medicine may lower sperm counts. Talk  to your health care professional or pharmacist for more information. This medicine may increase blood sugar. Ask your healthcare provider if changes in diet or medicines are needed if you have diabetes. What side effects may I notice from receiving this medicine? Side effects that you should report to your doctor or health care  professional as soon as possible:  allergic reactions like skin rash, itching or hives, swelling of the face, lips, or tongue  bone pain  breathing problems  changes in vision  chest pain  feeling faint or lightheaded, falls  fever, chills  pain, swelling, warmth in the leg  pain, tingling, numbness in the hands or feet  signs and symptoms of high blood sugar such as being more thirsty or hungry or having to urinate more than normal. You may also feel very tired or have blurry vision  signs and symptoms of low blood pressure like dizziness; feeling faint or lightheaded, falls; unusually weak or tired  stomach pain  swelling of the ankles, feet, hands  trouble passing urine or change in the amount of urine  unusually high or low blood pressure  unusually weak or tired Side effects that usually do not require medical attention (report to your doctor or health care professional if they continue or are bothersome):  change in sex drive or performance  changes in breast size in both males and females  changes in emotions or moods  headache  hot flashes  irritation at site where injected  loss of appetite  skin problems like acne, dry skin  vaginal dryness This list may not describe all possible side effects. Call your doctor for medical advice about side effects. You may report side effects to FDA at 1-800-FDA-1088. Where should I keep my medicine? This drug is given in a hospital or clinic and will not be stored at home. NOTE: This sheet is a summary. It may not cover all possible information. If you have questions about this medicine, talk to your doctor, pharmacist, or health care provider.  2021 Elsevier/Gold Standard (2018-06-16 14:05:56)  Denosumab injection What is this medicine? DENOSUMAB (den oh sue mab) slows bone breakdown. Prolia is used to treat osteoporosis in women after menopause and in men, and in people who are taking corticosteroids for 6  months or more. Delton See is used to treat a high calcium level due to cancer and to prevent bone fractures and other bone problems caused by multiple myeloma or cancer bone metastases. Delton See is also used to treat giant cell tumor of the bone. This medicine may be used for other purposes; ask your health care provider or pharmacist if you have questions. COMMON BRAND NAME(S): Prolia, XGEVA What should I tell my health care provider before I take this medicine? They need to know if you have any of these conditions:  dental disease  having surgery or tooth extraction  infection  kidney disease  low levels of calcium or Vitamin D in the blood  malnutrition  on hemodialysis  skin conditions or sensitivity  thyroid or parathyroid disease  an unusual reaction to denosumab, other medicines, foods, dyes, or preservatives  pregnant or trying to get pregnant  breast-feeding How should I use this medicine? This medicine is for injection under the skin. It is given by a health care professional in a hospital or clinic setting. A special MedGuide will be given to you before each treatment. Be sure to read this information carefully each time. For Prolia, talk to your pediatrician regarding the  use of this medicine in children. Special care may be needed. For Delton See, talk to your pediatrician regarding the use of this medicine in children. While this drug may be prescribed for children as young as 13 years for selected conditions, precautions do apply. Overdosage: If you think you have taken too much of this medicine contact a poison control center or emergency room at once. NOTE: This medicine is only for you. Do not share this medicine with others. What if I miss a dose? It is important not to miss your dose. Call your doctor or health care professional if you are unable to keep an appointment. What may interact with this medicine? Do not take this medicine with any of the following  medications:  other medicines containing denosumab This medicine may also interact with the following medications:  medicines that lower your chance of fighting infection  steroid medicines like prednisone or cortisone This list may not describe all possible interactions. Give your health care provider a list of all the medicines, herbs, non-prescription drugs, or dietary supplements you use. Also tell them if you smoke, drink alcohol, or use illegal drugs. Some items may interact with your medicine. What should I watch for while using this medicine? Visit your doctor or health care professional for regular checks on your progress. Your doctor or health care professional may order blood tests and other tests to see how you are doing. Call your doctor or health care professional for advice if you get a fever, chills or sore throat, or other symptoms of a cold or flu. Do not treat yourself. This drug may decrease your body's ability to fight infection. Try to avoid being around people who are sick. You should make sure you get enough calcium and vitamin D while you are taking this medicine, unless your doctor tells you not to. Discuss the foods you eat and the vitamins you take with your health care professional. See your dentist regularly. Brush and floss your teeth as directed. Before you have any dental work done, tell your dentist you are receiving this medicine. Do not become pregnant while taking this medicine or for 5 months after stopping it. Talk with your doctor or health care professional about your birth control options while taking this medicine. Women should inform their doctor if they wish to become pregnant or think they might be pregnant. There is a potential for serious side effects to an unborn child. Talk to your health care professional or pharmacist for more information. What side effects may I notice from receiving this medicine? Side effects that you should report to your doctor  or health care professional as soon as possible:  allergic reactions like skin rash, itching or hives, swelling of the face, lips, or tongue  bone pain  breathing problems  dizziness  jaw pain, especially after dental work  redness, blistering, peeling of the skin  signs and symptoms of infection like fever or chills; cough; sore throat; pain or trouble passing urine  signs of low calcium like fast heartbeat, muscle cramps or muscle pain; pain, tingling, numbness in the hands or feet; seizures  unusual bleeding or bruising  unusually weak or tired Side effects that usually do not require medical attention (report to your doctor or health care professional if they continue or are bothersome):  constipation  diarrhea  headache  joint pain  loss of appetite  muscle pain  runny nose  tiredness  upset stomach This list may not describe all possible  side effects. Call your doctor for medical advice about side effects. You may report side effects to FDA at 1-800-FDA-1088. Where should I keep my medicine? This medicine is only given in a clinic, doctor's office, or other health care setting and will not be stored at home. NOTE: This sheet is a summary. It may not cover all possible information. If you have questions about this medicine, talk to your doctor, pharmacist, or health care provider.  2021 Elsevier/Gold Standard (2017-07-05 16:10:44)

## 2020-04-15 NOTE — Progress Notes (Signed)
The patient was seen after she had been seen in the treatment room today.  She reported having some pain along her right anterior ribs immediately inferior to her right breast.  She reported having episodic shooting pain.  The area was examined.  There was no masses or nodules noted.  The patient was reassured that this area of tenderness was related to her prior surgery and that muscle and the underlying tissue had been removed and that there was a thin layer of tissue overlying a rib.  Also it was discussed with the patient that her episodic shooting pain could simply be nerve pain as nerves are regenerating beginning to fire again.  She expressed understanding agreement and knows to follow-up as needed.  Sandi Mealy, MHS, PA-C Physician Assistant

## 2020-04-18 ENCOUNTER — Other Ambulatory Visit: Payer: Self-pay

## 2020-04-18 ENCOUNTER — Ambulatory Visit (HOSPITAL_COMMUNITY)
Admission: RE | Admit: 2020-04-18 | Discharge: 2020-04-18 | Disposition: A | Discharge status: Home / Self Care | Payer: Medicaid Other | Source: Ambulatory Visit | Attending: Hematology and Oncology | Admitting: Hematology and Oncology

## 2020-04-18 ENCOUNTER — Encounter: Payer: Self-pay | Admitting: *Deleted

## 2020-04-18 DIAGNOSIS — C50919 Malignant neoplasm of unspecified site of unspecified female breast: Secondary | ICD-10-CM | POA: Diagnosis not present

## 2020-04-18 IMAGING — MR MR THORACIC SPINE WO/W CM
8 of 9 series · 31 of 48 positions shown · IV contrast (gadavist)
Comparison: Prior CT and bone scan from [DATE]

CLINICAL DATA: Initial evaluation for metastatic breast cancer,
staging.

EXAM:
MRI THORACIC WITHOUT AND WITH CONTRAST
TECHNIQUE: Multiplanar and multiecho pulse sequences of the thoracic spine were
obtained without and with intravenous contrast.
CONTRAST:  6mL GADAVIST GADOBUTROL 1 MMOL/ML IV SOLN

[Series 16: T1 · sagittal · 4.0mm · 1.72mm/px · 1 of 5 slices shown (1 of 3)]
[im 1/5]
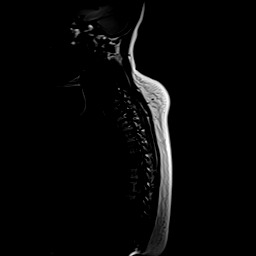

[Series 17: STIR · sagittal · 3.0mm · 1.00mm/px · 2 of 15 slices shown]
[im 1/15]
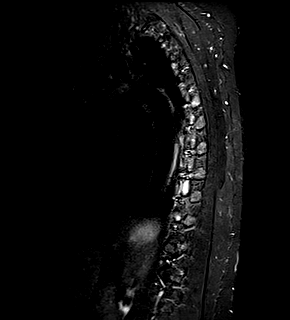
[im 15/15]
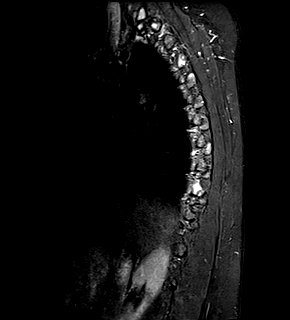

[Series 18: T1 · sagittal · 3.0mm · 1.00mm/px · 3 of 15 slices shown (2 of 3)]
[im 1/15]
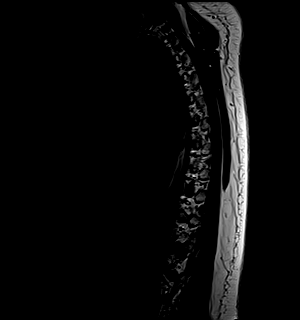
[im 8/15]
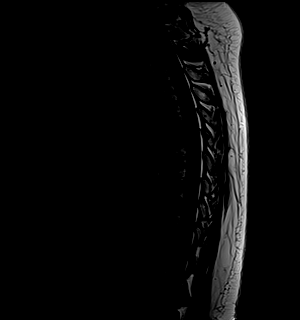
[im 15/15]
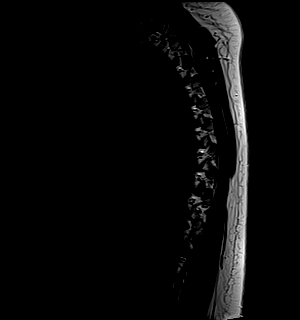

[Series 19: T2 · axial · 4.0mm · 0.78mm/px · z∈[-247,-36]mm · 9 of 42 slices shown]
[im 1/42]
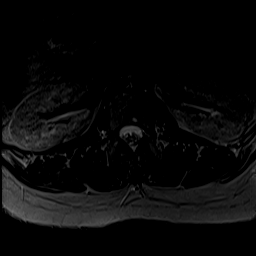
[im 6/42]
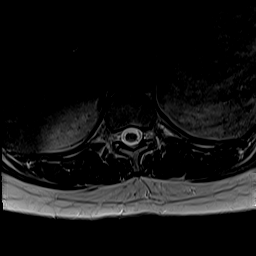
[im 11/42]
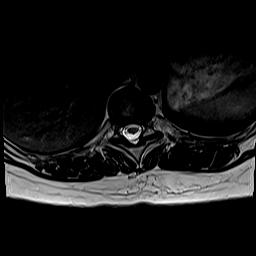
[im 16/42]
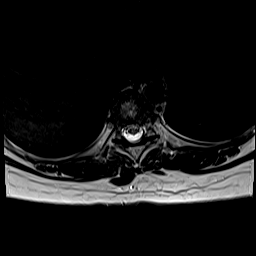
[im 21/42]
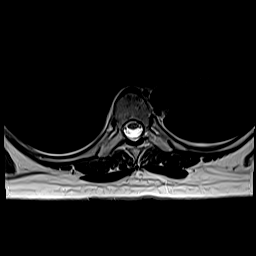
[im 26/42]
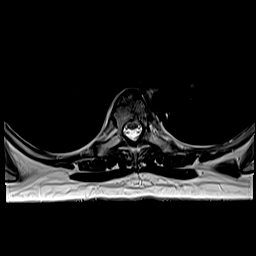
[im 31/42]
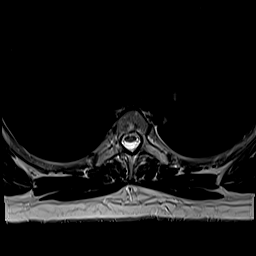
[im 36/42]
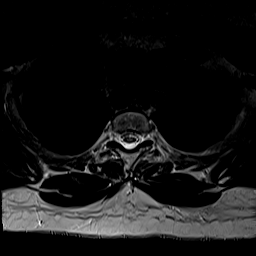
[im 42/42]
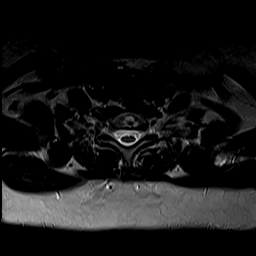

[Series 21: T1 · axial · 4.0mm · 0.39mm/px · z∈[-247,-36]mm · 8 of 42 slices shown (3 of 3)]
[im 1/42]
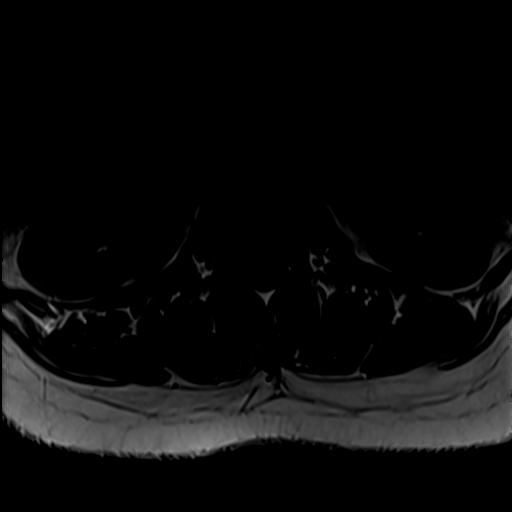
[im 6/42]
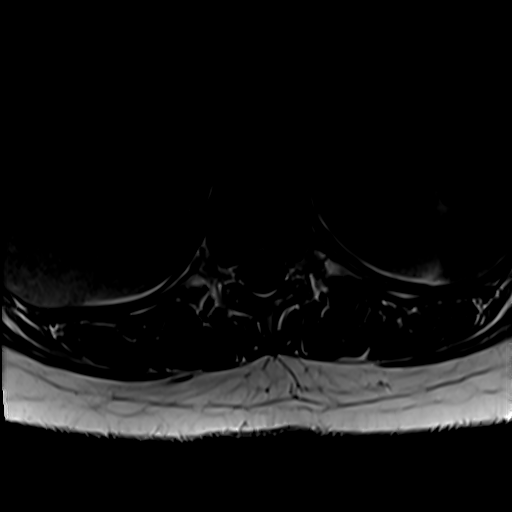
[im 11/42]
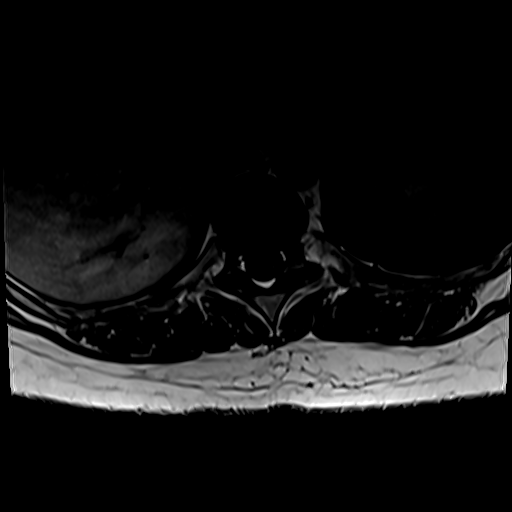
[im 16/42]
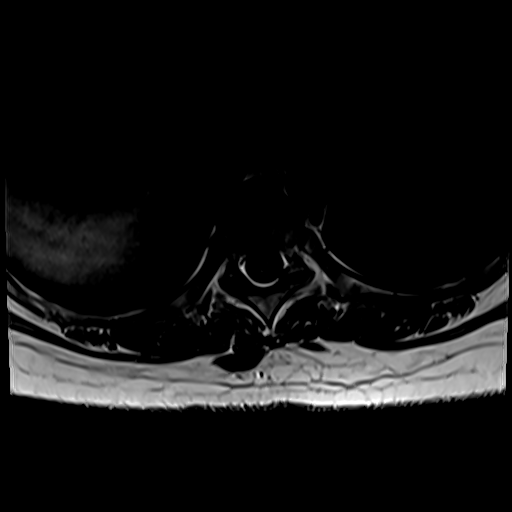
[im 26/42]
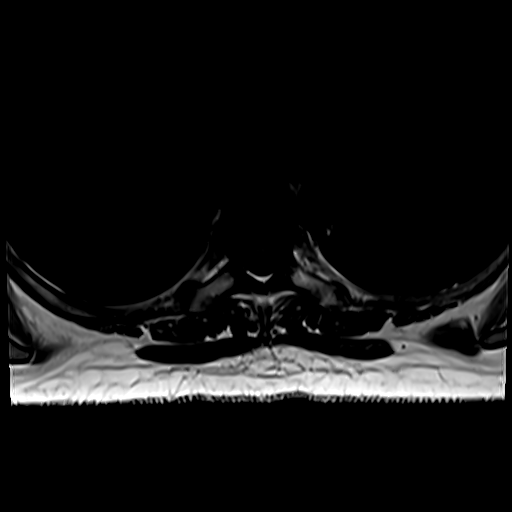
[im 31/42]
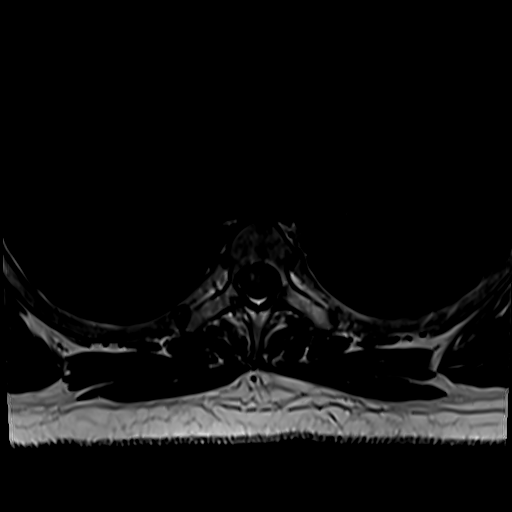
[im 36/42]
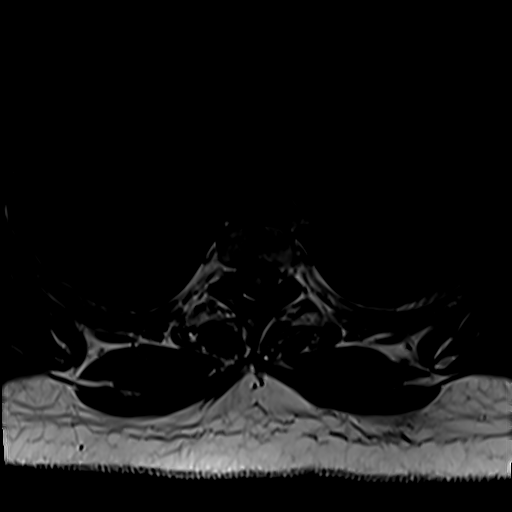
[im 42/42]
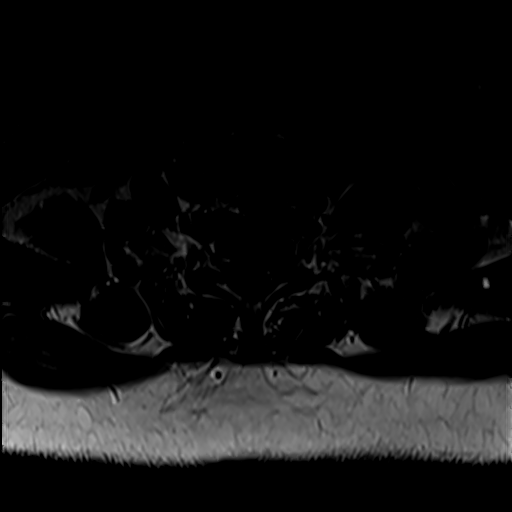

[Series 22: T2 post-contrast · sagittal · 3.0mm · 0.83mm/px · 3 of 15 slices shown]
[im 1/15]
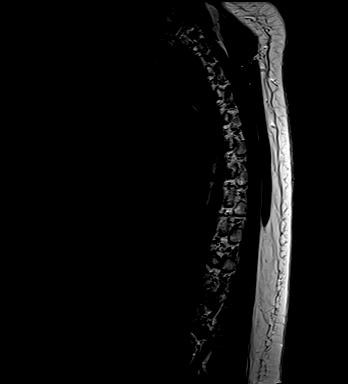
[im 8/15]
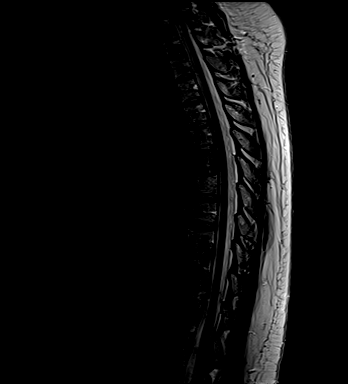
[im 15/15]
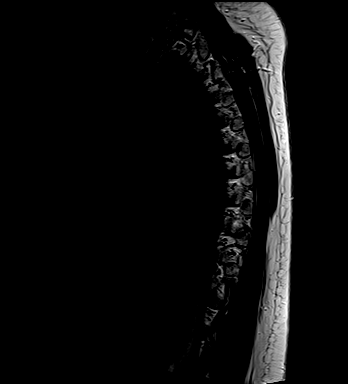

[Series 23: T1 fat-sat post-contrast · sagittal · 3.0mm · 1.00mm/px · 3 of 15 slices shown]
[im 1/15]
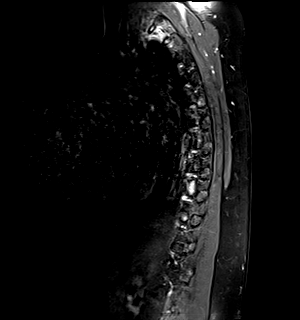
[im 8/15]
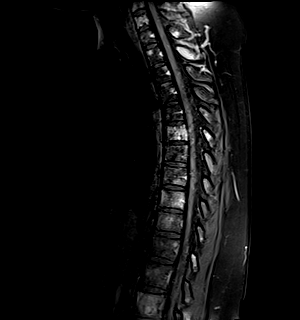
[im 15/15]
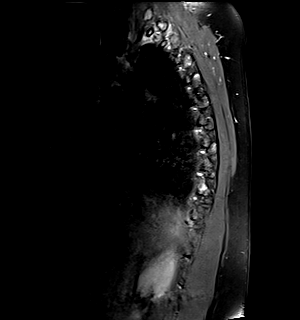

[Series 24: T1 post-contrast · axial · 4.0mm · 0.39mm/px · z∈[-247,-207]mm · 2 of 42 slices shown]
[im 1/42]
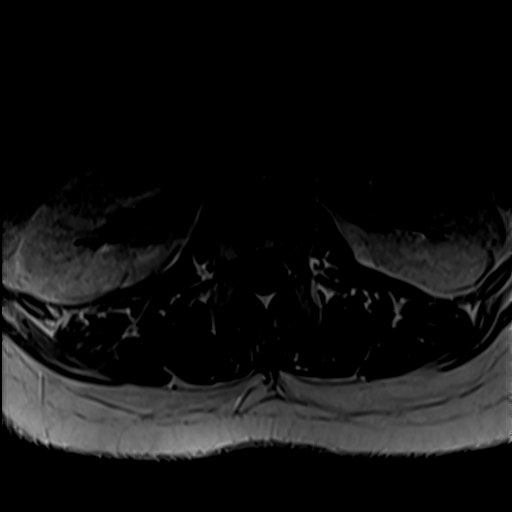
[im 6/42]
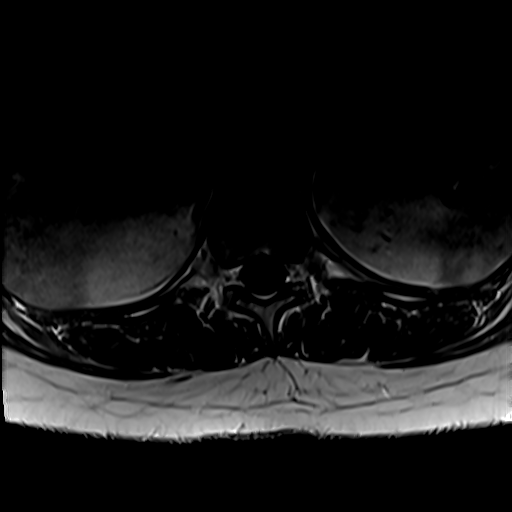

[31 of 48 positions shown; findings below may reference images not displayed]

FINDINGS: Alignment: Vertebral bodies normally aligned with preservation of
the normal thoracic kyphosis. No listhesis.

Vertebrae: Multiple scattered abnormal T1 hypointense enhancing
lesion seen throughout the thoracic spine, consistent with
widespread osseous metastatic disease. Prominent involvement of the
cervical spine noted on counter sequence. There is involvement of
essentially all levels, with most prominent involvement at the C6,
C7, T6, and T9 vertebral bodies. Associated pathologic fracture at
the T6 vertebral body with mild central height loss but no
significant bony retropulsion. Additional probable mild compression
deformity at the inferior endplate of T9 with no more than mild
central endplate height loss without bony retropulsion. Evidence for
early epidural extension into the ventral epidural space at the
level of T6, most pronounced on the right (series 20, image 19). No
other significant epidural or extraosseous extension of tumor
identified.

Cord:  Normal signal and morphology.  No abnormal enhancement.

Paraspinal and other soft tissues: Paraspinous soft tissues within
normal limits. Partially visualized lungs are grossly clear.
Visualized visceral structures unremarkable.

Disc levels:

No significant disc pathology seen within the thoracic spine.
Intervertebral discs are well hydrated with preserved disc height.
No disc bulge or focal disc herniation. No significant canal or
foraminal stenosis.
IMPRESSION: 1. Multiple scattered abnormal enhancing lesion throughout the
thoracic spine, consistent with widespread osseous metastatic
disease.
2. Associated mild pathologic compression fractures at T6 and T9
with no more than mild central height loss but no bony retropulsion.
3. Evidence for early epidural extension into the ventral epidural
space at the level of T6. No significant spinal stenosis at this
time. No other significant extra osseous or epidural extension of
tumor.

## 2020-04-18 MED ORDER — GADOBUTROL 1 MMOL/ML IV SOLN
6.0000 mL | Freq: Once | INTRAVENOUS | Status: AC | PRN
  Administered 2020-04-18: 6 mL via INTRAVENOUS

## 2020-04-19 ENCOUNTER — Encounter: Payer: Self-pay | Admitting: Licensed Clinical Social Worker

## 2020-04-19 NOTE — Progress Notes (Signed)
[place] CSW Progress Note  Clinical Education officer, museum received TC from patient. Patient is a little worried as she is waiting for results from MRI yesterday. She is trying to focus on what she has in her control (exercising, eating well, not drinking or smoking) as well as on her kids and the new milestones they are hitting.  CSW provided supportive counsel and also gave information on support groups for people with metastatic breast cancer for additional support.  CSW will contact patient by phone next week. Patient may call as needed between sessions.    Melissa [name] , LCSW

## 2020-04-25 NOTE — Telephone Encounter (Signed)
Advanced Heart Failure Patient Advocate Encounter  Prior Authorization for [facility] [place] has been approved.    PA# [id number] Effective dates: 03/22/2020 - 03/17/2021

## 2020-04-26 ENCOUNTER — Telehealth: Payer: Self-pay | Admitting: Licensed Clinical Social Worker

## 2020-04-26 ENCOUNTER — Encounter: Payer: Self-pay | Admitting: Licensed Clinical Social Worker

## 2020-04-26 NOTE — Telephone Encounter (Signed)
TC to patient for ongoing support. Patient unable to talk at this time as she is going to the gym with her sister. Pt stated she will call back later.   Christeen Douglas, LCSW

## 2020-04-26 NOTE — Progress Notes (Signed)
Melissa [name] Progress Note  Clinical Education officer, museum received return call from patient for ongoing support. Patient reports feeling fairly well overall. She is focused on staying positive and hopeful, especially as she is still feeling physically well. She is remembering how one of her aunt's handled herself in the face of illness by being active and positive and using that as well as a mentor who has been living with metastatic cancer for 20 years as touch points.  In particular, Melissa [name] is trying to find positive moments and memories with her girls, including a day out this past week to the park and science center.  When she does think about her diagnosis and the unknown, Melissa [name] acknowledges feeling sad. [name] normalized feelings and discussed with patient about ways to be purposeful in setting aside time to process and plan.  [name] will check-in with patient during her visit on 2/25. Patient may call as needed between visits.    Melissa [name] , LCSW

## 2020-05-03 MED FILL — IBRANCE 125 MG TABS: 125 | 28 days supply | Qty: 21 | Fill #1

## 2020-05-05 NOTE — Assessment & Plan Note (Signed)
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 Neoadjuvant chemotherapy with dose dense Adriamycin and Cytoxan given every 3 weeks without Neulasta support given her pregnancycompleted 05/28/2019 2. Post-Partumweekly Taxol x12starting 07/03/2019 3. Bilateral mastectomy 10/08/2019: Right mastectomy: Multifocal grade 2 IDC 0.8 cm and 0.6 cm with high-grade DCIS, margins negative, 0/4lymph nodes ER 95%, PR 5%, Ki-67 10%, HER-2 negative;left mastectomy: IDC 0.6 cm, LVI, margins negative, 1/4 lymph nodes positive, ER 60%, PR 60%, HER-2 negative, Ki-67 50% RCB class II 4. Adjuvant radiation therapy 5. Followed by adjuvant antiestrogen therapy with complete estrogen blockade (ovarian suppression with AI) CT C/A/P on 5/13 shows sclerotic bone lesions, bone scan negative 6.DeclinedCDK 4 and 6 inhibitor abemaciclibwith antiestrogen therapy ---------------------------------------------------------------------------------------------------------------------------------------------------- Bone scan 04/01/2020: Interval development of multifocal abnormal areas of increased tracer uptake compatible with diffuse bone metastases bony pelvis, right acetabulum, left pubic bone, anterior left lower rib, thoracic and upper lumbar spine including T6, T9, L1, intertrochanteric region of right femur  03/31/2020: CT CAP: Bone metastases.  No visceral mets.  Treatment plan: Faslodex + letrozole + Ibrance plus Zoladex along with  [place] Toxicities:   RTC in 2 weeks for labs and follow up.

## 2020-05-05 NOTE — Progress Notes (Signed)
Patient Care Team: [name], NP as PCP - General (Nurse Practitioner) [name], RN as Oncology Nurse Navigator Rockwell Germany, RN as Oncology Nurse Navigator Rolm Bookbinder, MD as Surgeon (General Surgery) Nicholas Lose, MD as Medical Oncologist (Hematology and Oncology)  DIAGNOSIS:    ICD-10-CM   1. Malignant neoplasm of overlapping sites of left breast in female, estrogen receptor positive ([place])  C50.812    Z17.0     SUMMARY OF ONCOLOGIC HISTORY: Oncology History  Malignant neoplasm of overlapping sites of left breast in female, estrogen receptor positive ([place])  02/26/2019 Initial Diagnosis   Pregnant lady with 43-monthhistory of left breast swelling and skin thickening, mammogram showed pleomorphic calcifications 8 cm, axilla lymph node biopsy positive ER 60%, PR 60%, Ki-67 50%, HER-2 negative ratio 1.25, anterior and posterior end of the calcifications biopsied: 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 ER 50%, PR 30%, Ki-67 50%, HER-2 negative ratio 1.03   03/04/2019 Cancer Staging   Staging form: Breast, AJCC 8th Edition - Clinical stage from 03/04/2019: Stage IIIB (cT4, cN1, cM0, G3, ER+, PR+, HER2-) - Signed by GNicholas Lose MD on 03/04/2019    Neo-Adjuvant Chemotherapy   Adriamycin and Cytoxan x 4 given every three weeks without Neulasta support., followed by weekly Taxol x 12 versus Taxotere Cytoxan after her delivery   04/15/2019 Genetic Testing   Negative genetic testing:  No pathogenic variants detected on the [employer] Breast Cancer Guidelines-Based Panel or the Common Hereditary Cancers Panel. The report date is 04/15/2019.  The Breast Cancer Guidelines-Based panel offered by [employer] includes sequencing and rearrangement analysis for the following 11 genes:  ATM, BRCA1, BRCA2, CDH1, CHEK2, NBN, NF1, PALB2, PTEN, STK11 and TP53.  The Common Hereditary Cancers Panel offered by [facility] includes sequencing and/or deletion duplication testing of the  following 48 genes: APC, ATM, AXIN2, BARD1, BMPR1A, BRCA1, BRCA2, BRIP1, CDH1, CDK4, CDKN2A (p14ARF), CDKN2A (p16INK4a), CHEK2, CTNNA1, DICER1, EPCAM (Deletion/duplication testing only), GREM1 (promoter region deletion/duplication testing only), KIT, MEN1, MLH1, MSH2, MSH3, MSH6, MUTYH, NBN, NF1, NHTL1, PALB2, PDGFRA, PMS2, POLD1, POLE, PTEN, RAD50, RAD51C, RAD51D, RNF43, SDHB, SDHC, SDHD, SMAD4, SMARCA4. STK11, TP53, TSC1, TSC2, and VHL.  The following genes were evaluated for sequence changes only: SDHA and HOXB13 c.251G>A variant only.    10/08/2019 Surgery   Bilateral mastectomies ([name]:  Right mastectomy: Multifocal grade 2 IDC 0.8 cm and 0.6 cm with high-grade DCIS, margins negative, 0/4 lymph nodes ER 95%, PR 5%, Ki-67 10%, HER-2 negative;  Left mastectomy: IDC 0.6 cm, LVI, margins negative, 1/4 lymph nodes positive, ER 60%, PR 60%, HER-2 negative, Ki-67 50% RCB class II   11/30/2019 - 01/08/2020 Radiation Therapy   Adjuvant radiation     CHIEF COMPLIANT: Follow-up of metastatic breast cancer  INTERVAL HISTORY: Melissa Hopfis a 30y.o. with above-mentioned history of metastatic breast cancerwith osseous metastases who is currently on treatment with Faslodex, letrozole, Ibrance plus Zoladex along with Xgeva. She presents to the clinic todayfor follow-up.  She is tolerating Ibrance extremely well without any major adverse effects.  ALLERGIES:  has No Known Allergies.  MEDICATIONS:  Current Outpatient Medications  Medication Sig Dispense Refill  . carvedilol (COREG) 6.25 MG tablet Take 1 tablet (6.25 mg total) by mouth 2 (two) times daily. 60 tablet 6  . dapagliflozin propanediol (FARXIGA) 10 MG TABS tablet Take 1 tablet (10 mg total) by mouth daily before breakfast. 30 tablet 11  . furosemide (LASIX) 20 MG tablet Take 1 tablet (20 mg  total) by mouth daily as needed for fluid or edema. 90 tablet 2  . gabapentin (NEURONTIN) 100 MG capsule Take 1 capsule (100 mg total) by mouth at  bedtime. 30 capsule 0  . letrozole (FEMARA) 2.5 MG tablet Take 1 tablet (2.5 mg total) by mouth daily. 90 tablet 3  . methocarbamol (ROBAXIN) 500 MG tablet Take 1 tablet (500 mg total) by mouth 3 (three) times daily. 30 tablet 1  . palbociclib (IBRANCE) 125 MG tablet Take 1 tablet (125 mg total) by mouth daily. Take for 21 days on, 7 days off, repeat every 28 days. 21 tablet 6  . sacubitril-valsartan (ENTRESTO) 97-103 MG Take 1 tablet by mouth 2 (two) times daily. 60 tablet 11  . traMADol (ULTRAM) 50 MG tablet Take 50 mg by mouth every 6 (six) hours as needed.     No current facility-administered medications for this visit.    PHYSICAL EXAMINATION: ECOG PERFORMANCE STATUS: 1 - Symptomatic but completely ambulatory  Vitals:   05/06/20 1007  BP: 101/66  Pulse: 62  Resp: 20  Temp: (!) 97.5 F (36.4 C)  SpO2: 100%   Filed Weights   05/06/20 1007  Weight: 178 lb 3.2 oz (80.8 kg)    LABORATORY DATA:  I have reviewed the data as listed CMP Latest Ref Rng & Units 04/14/2020 10/08/2019 09/30/2019  Glucose 70 - 99 mg/dL 101(H) 94 100(H)  BUN 6 - 20 mg/dL _0 Creatinine 0.44 - 1.00 mg/dL 1.00 0.90 0.84  Sodium 135 - 145 mmol/L 138 138 138  Potassium 3.5 - 5.1 mmol/L 4.1 3.9 4.5  Chloride 98 - 111 mmol/L 105 99 102  CO2 22 - 32 mmol/L 26 - 28  Calcium 8.9 - 10.3 mg/dL 9.3 - 9.3  Total Protein 6.5 - 8.1 g/dL 7.9 - 7.3  Total Bilirubin 0.3 - 1.2 mg/dL 0.6 - 0.6  Alkaline Phos 38 - 126 U/L 99 - 59  AST 15 - 41 U/L 65(H) - 22  ALT 0 - 44 U/L 32 - 12    Lab Results  Component Value Date   WBC 2.1 (L) 05/06/2020   HGB 11.9 (L) 05/06/2020   HCT 37.0 05/06/2020   MCV 91.1 05/06/2020   PLT 178 05/06/2020   NEUTROABS PENDING 05/06/2020    ASSESSMENT & PLAN:  Malignant neoplasm of overlapping sites of left breast in female, estrogen receptor positive ([place]) 02/26/2019:Pregnant lady with 68-monthhistory of left breast swelling and skin thickening, mammogram showed pleomorphic  calcifications 8 cm, axilla lymph node biopsy positive ER 60%, PR 60%, Ki-67 50%, HER-2 negative ratio 1.25, anterior and posterior end of the calcifications biopsied: 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 Neoadjuvant chemotherapy with dose dense Adriamycin and Cytoxan given every 3 weeks without Neulasta support given her pregnancycompleted 05/28/2019 2. Post-Partumweekly Taxol x12starting 07/03/2019 3. Bilateral mastectomy 10/08/2019: Right mastectomy: Multifocal grade 2 IDC 0.8 cm and 0.6 cm with high-grade DCIS, margins negative, 0/4lymph nodes ER 95%, PR 5%, Ki-67 10%, HER-2 negative;left mastectomy: IDC 0.6 cm, LVI, margins negative, 1/4 lymph nodes positive, ER 60%, PR 60%, HER-2 negative, Ki-67 50% RCB class II 4. Adjuvant radiation therapy 5. Followed by adjuvant antiestrogen therapy with complete estrogen blockade (ovarian suppression with AI) CT C/A/P on 5/13 shows sclerotic bone lesions, bone scan negative 6.DeclinedCDK 4  and 6 inhibitor abemaciclibwith antiestrogen therapy ---------------------------------------------------------------------------------------------------------------------------------------------------- Bone scan 04/01/2020: Interval development of multifocal abnormal areas of increased tracer uptake compatible with diffuse bone metastases bony pelvis, right acetabulum, left pubic bone, anterior left lower rib, thoracic and upper lumbar spine including T6, T9, L1, intertrochanteric region of right femur  03/31/2020: CT CAP: Bone metastases.  No visceral mets.  Treatment plan: Faslodex + letrozole + Ibrance plus Zoladex along with [place] Toxicities: 1.  Neutropenia: ANC today 0.8, I will reduce the dosage of Ibrance to 75 mg daily.  She will  hold off on taking Ibrance until next week when she comes in for injection.  We will add labs at that appointment.  Otherwise she is tolerating it extremely well.  RTC in 5 weeks for labs, injection and follow up.  No orders of the defined types were placed in this encounter.  The patient has a good understanding of the overall plan. she agrees with it. she will call with any problems that may develop before the next visit here.  Total time spent: 30 mins including face to face time and time spent for planning, charting and coordination of care  Rulon Eisenmenger, MD, MPH 05/06/2020  I, Molly Dorshimer, am acting as scribe for Dr. Nicholas Lose.  I have reviewed the above documentation for accuracy and completeness, and I agree with the above.

## 2020-05-06 ENCOUNTER — Inpatient Hospital Stay (HOSPITAL_BASED_OUTPATIENT_CLINIC_OR_DEPARTMENT_OTHER): Payer: Medicaid Other | Admitting: Hematology and Oncology

## 2020-05-06 ENCOUNTER — Inpatient Hospital Stay: Payer: Medicaid Other

## 2020-05-06 ENCOUNTER — Other Ambulatory Visit: Payer: Self-pay | Admitting: Hematology and Oncology

## 2020-05-06 ENCOUNTER — Encounter: Payer: Self-pay | Admitting: Licensed Clinical Social Worker

## 2020-05-06 ENCOUNTER — Telehealth: Payer: Self-pay | Admitting: Hematology and Oncology

## 2020-05-06 ENCOUNTER — Other Ambulatory Visit: Payer: Self-pay

## 2020-05-06 DIAGNOSIS — Z5111 Encounter for antineoplastic chemotherapy: Secondary | ICD-10-CM | POA: Diagnosis not present

## 2020-05-06 DIAGNOSIS — C50919 Malignant neoplasm of unspecified site of unspecified female breast: Secondary | ICD-10-CM

## 2020-05-06 DIAGNOSIS — C50812 Malignant neoplasm of overlapping sites of left female breast: Secondary | ICD-10-CM

## 2020-05-06 DIAGNOSIS — Z170 Estrogen receptor positive status [ER+]: Secondary | ICD-10-CM | POA: Diagnosis not present

## 2020-05-06 LAB — CBC WITH DIFFERENTIAL (CANCER CENTER ONLY)
Abs Immature Granulocytes: 0 10*3/uL (ref 0.00–0.07)
Basophils Absolute: 0 10*3/uL (ref 0.0–0.1)
Basophils Relative: 1 %
Eosinophils Absolute: 0 10*3/uL (ref 0.0–0.5)
Eosinophils Relative: 1 %
HCT: 37 % (ref 36.0–46.0)
Hemoglobin: 11.9 g/dL — ABNORMAL LOW (ref 12.0–15.0)
Immature Granulocytes: 0 %
Lymphocytes Relative: 46 %
Lymphs Abs: 1 10*3/uL (ref 0.7–4.0)
MCH: 29.3 pg (ref 26.0–34.0)
MCHC: 32.2 g/dL (ref 30.0–36.0)
MCV: 91.1 fL (ref 80.0–100.0)
Monocytes Absolute: 0.3 10*3/uL (ref 0.1–1.0)
Monocytes Relative: 15 %
Neutro Abs: 0.8 10*3/uL — ABNORMAL LOW (ref 1.7–7.7)
Neutrophils Relative %: 37 %
Platelet Count: 178 10*3/uL (ref 150–400)
RBC: 4.06 MIL/uL (ref 3.87–5.11)
RDW: 16.5 % — ABNORMAL HIGH (ref 11.5–15.5)
WBC Count: 2.1 10*3/uL — ABNORMAL LOW (ref 4.0–10.5)
nRBC: 0 % (ref 0.0–0.2)

## 2020-05-06 LAB — CMP (CANCER CENTER ONLY)
ALT: 12 U/L (ref 0–44)
AST: 14 U/L — ABNORMAL LOW (ref 15–41)
Albumin: 3.9 g/dL (ref 3.5–5.0)
Alkaline Phosphatase: 79 U/L (ref 38–126)
Anion gap: 6 (ref 5–15)
BUN: 12 mg/dL (ref 6–20)
CO2: 25 mmol/L (ref 22–32)
Calcium: 8.5 mg/dL — ABNORMAL LOW (ref 8.9–10.3)
Chloride: 108 mmol/L (ref 98–111)
Creatinine: 0.7 mg/dL (ref 0.44–1.00)
GFR, Estimated: >60 mL/min (ref >60)
Glucose, Bld: 99 mg/dL (ref 70–99)
Potassium: 4.1 mmol/L (ref 3.5–5.1)
Sodium: 139 mmol/L (ref 135–145)
Total Bilirubin: 0.3 mg/dL (ref 0.3–1.2)
Total Protein: 7.5 g/dL (ref 6.5–8.1)

## 2020-05-06 MED ORDER — PALBOCICLIB 100 MG PO TABS: 100.0000 mg | ORAL_TABLET | Freq: Every day | ORAL | 6 refills | Status: DC

## 2020-05-06 MED ORDER — ANASTROZOLE 1 MG PO TABS: 1.0000 mg | ORAL_TABLET | Freq: Every day | ORAL | 3 refills | Status: DC

## 2020-05-06 MED FILL — IBRANCE 100 MG TABS: 100 | 28 days supply | Qty: 21 | Fill #0

## 2020-05-06 NOTE — Telephone Encounter (Signed)
Scheduled appts per 2/25 los. Gave pt an appt reminder card.

## 2020-05-06 NOTE — Progress Notes (Signed)
[facility] CSW Progress Note  Holiday representative met with patient to provide ongoing support. Patient reports doing well emotionally lately. She is focusing on doing things that give her joy and not giving energy to the negative. 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 No significant concerns at this time.  CSW will check-in by phone in 3-4 weeks. Patient may call as needed between visits.    Christeen Douglas , LCSW

## 2020-05-12 ENCOUNTER — Ambulatory Visit (HOSPITAL_COMMUNITY)
Admission: RE | Admit: 2020-05-12 | Discharge: 2020-05-12 | Disposition: A | Discharge status: Home / Self Care | Payer: Medicaid Other | Source: Ambulatory Visit | Attending: Internal Medicine | Admitting: Internal Medicine

## 2020-05-12 ENCOUNTER — Encounter (HOSPITAL_COMMUNITY): Payer: Self-pay | Admitting: Internal Medicine

## 2020-05-12 ENCOUNTER — Other Ambulatory Visit (HOSPITAL_COMMUNITY): Payer: Self-pay | Admitting: Internal Medicine

## 2020-05-12 ENCOUNTER — Other Ambulatory Visit: Payer: Self-pay

## 2020-05-12 VITALS — BP 118/70 | HR 68 | Wt 179.0 lb

## 2020-05-12 DIAGNOSIS — I110 Hypertensive heart disease with heart failure: Secondary | ICD-10-CM | POA: Diagnosis not present

## 2020-05-12 DIAGNOSIS — I5022 Chronic systolic (congestive) heart failure: Secondary | ICD-10-CM | POA: Diagnosis not present

## 2020-05-12 DIAGNOSIS — Z923 Personal history of irradiation: Secondary | ICD-10-CM | POA: Diagnosis not present

## 2020-05-12 DIAGNOSIS — Z803 Family history of malignant neoplasm of breast: Secondary | ICD-10-CM | POA: Diagnosis not present

## 2020-05-12 DIAGNOSIS — I5021 Acute systolic (congestive) heart failure: Secondary | ICD-10-CM | POA: Diagnosis present

## 2020-05-12 DIAGNOSIS — I10 Essential (primary) hypertension: Secondary | ICD-10-CM | POA: Diagnosis not present

## 2020-05-12 DIAGNOSIS — Z8249 Family history of ischemic heart disease and other diseases of the circulatory system: Secondary | ICD-10-CM | POA: Insufficient documentation

## 2020-05-12 DIAGNOSIS — Z87891 Personal history of nicotine dependence: Secondary | ICD-10-CM | POA: Insufficient documentation

## 2020-05-12 DIAGNOSIS — I428 Other cardiomyopathies: Secondary | ICD-10-CM | POA: Diagnosis not present

## 2020-05-12 DIAGNOSIS — Z79899 Other long term (current) drug therapy: Secondary | ICD-10-CM | POA: Diagnosis not present

## 2020-05-12 DIAGNOSIS — Z853 Personal history of malignant neoplasm of breast: Secondary | ICD-10-CM | POA: Diagnosis not present

## 2020-05-12 LAB — BASIC METABOLIC PANEL
Anion gap: 8 (ref 5–15)
BUN: 11 mg/dL (ref 6–20)
CO2: 24 mmol/L (ref 22–32)
Calcium: 8.7 mg/dL — ABNORMAL LOW (ref 8.9–10.3)
Chloride: 107 mmol/L (ref 98–111)
Creatinine, Ser: 0.6 mg/dL (ref 0.44–1.00)
GFR, Estimated: >60 mL/min (ref >60)
Glucose, Bld: 94 mg/dL (ref 70–99)
Potassium: 4.4 mmol/L (ref 3.5–5.1)
Sodium: 139 mmol/L (ref 135–145)

## 2020-05-12 LAB — BRAIN NATRIURETIC PEPTIDE: B Natriuretic Peptide: 106.3 pg/mL — ABNORMAL HIGH (ref 0.0–100.0)

## 2020-05-12 LAB — CBC
HCT: 36.8 % (ref 36.0–46.0)
Hemoglobin: 12.4 g/dL (ref 12.0–15.0)
MCH: 30.3 pg (ref 26.0–34.0)
MCHC: 33.7 g/dL (ref 30.0–36.0)
MCV: 90 fL (ref 80.0–100.0)
Platelets: 290 10*3/uL (ref 150–400)
RBC: 4.09 MIL/uL (ref 3.87–5.11)
RDW: 17.2 % — ABNORMAL HIGH (ref 11.5–15.5)
WBC: 3.3 10*3/uL — ABNORMAL LOW (ref 4.0–10.5)
nRBC: 0 % (ref 0.0–0.2)

## 2020-05-12 MED ORDER — DAPAGLIFLOZIN PROPANEDIOL 10 MG PO TABS: 10.0000 mg | ORAL_TABLET | Freq: Every day | ORAL | 11 refills | Status: DC

## 2020-05-12 MED ORDER — SACUBITRIL-VALSARTAN 97-103 MG PO TABS: 1.0000 | ORAL_TABLET | Freq: Two times a day (BID) | ORAL | 11 refills | Status: DC

## 2020-05-12 MED ORDER — CARVEDILOL 6.25 MG PO TABS: 6.2500 mg | ORAL_TABLET | Freq: Two times a day (BID) | ORAL | 11 refills | Status: DC

## 2020-05-12 NOTE — Patient Instructions (Signed)
Refills for Carvedilol, Wilder Glade, and Entresto have been sent to [facility] in [place] for you  Labs done today, we will call you for abnormal results  Your physician has requested that you have an echocardiogram. Echocardiography is a painless test that uses sound waves to create images of your heart. It provides your doctor with information about the size and shape of your heart and how well your heart's chambers and valves are working. This procedure takes approximately one hour. There are no restrictions for this procedure.  Please call our office in August 2022 to schedule your follow up appointment  If you have any questions or concerns before your next appointment please send [place] a message through [place] or call our office at [phone].    TO LEAVE A MESSAGE FOR THE NURSE SELECT OPTION 2, PLEASE LEAVE A MESSAGE INCLUDING: . YOUR NAME . DATE OF BIRTH . CALL BACK NUMBER . REASON FOR CALL**this is important as we prioritize the call backs  [place] AS LONG AS YOU CALL BEFORE 4:00 PM  At the [facility], you and your health needs are our priority. As part of our continuing mission to provide you with exceptional heart care, we have created designated Provider Care Teams. These Care Teams include your primary Cardiologist (physician) and Advanced Practice Providers (APPs- Physician Assistants and Nurse Practitioners) who all work together to provide you with the care you need, when you need it.   You may see any of the following providers on your designated Care Team at your next follow up: Marland Kitchen Dr Glori Bickers . Dr Loralie Champagne . Dr Vickki Muff . Darrick Grinder, NP . Lyda Jester, [place] . Audry Riles, PharmD   Please be sure to bring in all your medications bottles to every appointment.

## 2020-05-12 NOTE — Addendum Note (Signed)
Encounter addended by: Scarlette Calico, RN on: 05/12/2020 11:42 AM  Actions taken: Diagnosis association updated, Order list changed, Clinical Note Signed, Charge Capture section accepted

## 2020-05-12 NOTE — Progress Notes (Signed)
CARDIO-ONCOLOGY CLINIC NOTE  Referring Physician: Lindi Adie Primary Cardiologist: New  HPI:  Melissa [name] is 30 y.o. female with left breast cancer referred by Dr. Lindi Adie for enrollment into the Cardio-Oncology program due to nonischemic cardiomyopathy.   Malignant neoplasm of overlapping sites of left breast in female, estrogen receptor positive ([place])  02/26/2019 Initial Diagnosis   Pregnant lady with 26-month history of left breast swelling and skin thickening, mammogram showed pleomorphic calcifications 8 cm, axilla lymph node biopsy positive ER 60%, PR 60%, Ki-67 50%, HER-2 negative ratio 1.25, anterior and posterior end of the calcifications biopsied: 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 ER 50%, PR 30%, Ki-67 50%, HER-2 negative ratio 1.03   03/04/2019 Cancer Staging   Staging form: Breast, AJCC 8th Edition - Clinical stage from 03/04/2019: Stage IIIB (cT4, cN1, cM0, G3, ER+, PR+, HER2-) - Signed by Nicholas Lose, MD on 03/04/2019    Neo-Adjuvant Chemotherapy   Adriamycin and Cytoxan x 4 given every three weeks without Neulasta support., followed by weekly Taxol x 12 versus Taxotere Cytoxan after her delivery   04/15/2019 Genetic Testing   Negative genetic testing:  No pathogenic variants detected on the [employer]ae Breast Cancer Guidelines-Based Panel or the Common Hereditary Cancers Panel. The report date is 04/15/2019.  The BreastCancerGuidelines-Basedpanel offered by [facility] includes sequencing and rearrangement analysis for the following 11genes: ATM, BRCA1, BRCA2, CDH1, CHEK2,NBN, NF1,PALB2, PTEN, STK11 and TP53.The Common Hereditary Cancers Panel offered by [facility] includes sequencing and/or deletion duplication testing of the following 48 genes: APC, ATM, AXIN2, BARD1, BMPR1A, BRCA1, BRCA2, BRIP1, CDH1, CDK4, CDKN2A (p14ARF), CDKN2A (p16INK4a), CHEK2, CTNNA1, DICER1, EPCAM (Deletion/duplication testing only), GREM1 (promoter region deletion/duplication testing  only), KIT, MEN1, MLH1, MSH2, MSH3, MSH6, MUTYH, NBN, NF1, NHTL1, PALB2, PDGFRA, PMS2, POLD1, POLE, PTEN, RAD50, RAD51C, RAD51D, RNF43, SDHB, SDHC, SDHD, SMAD4, SMARCA4. STK11, TP53, TSC1, TSC2, and VHL.  The following genes were evaluated for sequence changes only: SDHA and HOXB13 c.251G>A variant only.      Echo 03/17/19: EF 60% RV normal   Diagnosed with left breast CA in 12/20 when she was 4 months pregnant with her second child. While pregnant she was treated with 4 cycles of neoadjuvant chemotherapy with dose dense Adriamycin and Cytoxan. She delivered her baby girl on 06/19/19. She did not have problems with HTN or pre-eclampsia.   She presented to the ED on 06/27/19 with acute HF. Echo on 07/02/19 showed a severely decreased ejection fraction, <20%. She was referred to the HF Clinic   Has finished XRT. Taking Ibrance and anastrazole.   Here for f/u. Feels great. 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 Working out on bike and TM. No CP or SOB. No edema, orthopnea or PND. Compliant with medicines but just ran out of [place], Farxiga and carvedilol a few days ago. BP ok.    Echo 7/21 EF 35-40% Echo 4/21 EF < 20%   Past Medical History:  Diagnosis Date  . Abnormal Pap smear 07/13/11   ASCUS +HPV  . Anemia   . Cancer [facility])    breast cancer  . CHF (congestive heart failure) ([place])   . Family history of breast cancer   . Family history of colon cancer   . Family history of throat cancer   . Fracture of distal phalanx of finger 10/05/2019   Right thumb and middle finger  . Herpes simplex without mention of complication    dx age 28    Current Outpatient Medications  Medication Sig  Dispense Refill  . anastrozole (ARIMIDEX) 1 MG tablet Take 1 tablet (1 mg total) by mouth daily. 90 tablet 3  . carvedilol (COREG) 6.25 MG tablet Take 1 tablet (6.25 mg total) by mouth 2 (two) times daily. 60 tablet 6  . dapagliflozin propanediol (FARXIGA) 10 MG TABS tablet Take 1 tablet  (10 mg total) by mouth daily before breakfast. 30 tablet 11  . furosemide (LASIX) 20 MG tablet Take 1 tablet (20 mg total) by mouth daily as needed for fluid or edema. 90 tablet 2  . gabapentin (NEURONTIN) 100 MG capsule Take 1 capsule (100 mg total) by mouth at bedtime. 30 capsule 0  . palbociclib (IBRANCE) 100 MG tablet Take 1 tablet (100 mg total) by mouth daily. Take for 21 days on, 7 days off, repeat every 28 days. 21 tablet 6  . sacubitril-valsartan (ENTRESTO) 97-103 MG Take 1 tablet by mouth 2 (two) times daily. 60 tablet 11  . traMADol (ULTRAM) 50 MG tablet Take 50 mg by mouth every 6 (six) hours as needed.     No current facility-administered medications for this encounter.    No Known Allergies    Social History   Socioeconomic History  . Marital status: Single    Spouse name: Not on file  . Number of children: Not on file  . Years of education: Not on file  . Highest education level: Not on file  Occupational History  . Not on file  Tobacco Use  . Smoking status: Former Smoker    Types: Cigarettes    Quit date: 02/27/2019    Years since quitting: 1.2  . Smokeless tobacco: Never Used  Vaping Use  . Vaping Use: Never used  Substance and Sexual Activity  . Alcohol use: Not Currently  . Drug use: Yes    Frequency: 2.0 times per week    Types: Marijuana    Comment: x1 weekly  . Sexual activity: Yes    Birth control/protection: None  Other Topics Concern  . Not on file  Social History Narrative  . Not on file   Social Determinants of Health   Financial Resource Strain: Not on file  Food Insecurity: Not on file  Transportation Needs: Not on file  Physical Activity: Not on file  Stress: Not on file  Social Connections: Not on file  Intimate Partner Violence: Not on file      Family History  Problem Relation Age of Onset  . Breast cancer Paternal Grandmother        dx. 70s or younger  . Stroke Father 44  . Hypertension Father   . Cancer Maternal Aunt         unknown type, dx. late 101s  . Colon cancer Paternal Uncle        dx 69s  . Throat cancer Maternal Grandmother        dx. 54s  . Cancer Paternal Uncle        unknown type, dx. 19s  . Breast cancer Cousin        dx. 77s, paternal 1st cousin    Vitals:   05/12/20 1046  BP: 118/70  Pulse: 68  SpO2: 100%  Weight: 81.2 kg (179 lb)   PHYSICAL EXAM: General:  Well appearing. No resp difficulty HEENT: normal Neck: supple. no JVD. Carotids 2+ bilat; no bruits. No lymphadenopathy or thryomegaly appreciated. Cor: PMI nondisplaced. Regular rate & rhythm. No rubs, gallops or murmurs. Lungs: clear Abdomen: soft, nontender, nondistended. No hepatosplenomegaly. No bruits or  masses. Good bowel sounds. Extremities: no cyanosis, clubbing, rash, edema Neuro: alert & orientedx3, cranial nerves grossly intact. moves all 4 extremities w/o difficulty. Affect pleasant  ASSESSMENT & PLAN:  1. Acute systolic HF - Echo 0/15 EF 60% (pre-chemo and pre-delivery) - Echo 4/21 EF 15-20% suspect peri-partum vs adriamycin toxicity  - Echo 7/21 EF 35-40% - Doing great. NYHA I  - Volume status looks good off lasix - Continue Entresto 97/103 bid - Continue Spiro 25 daily - Continue carvedilol 6.25 bid - Continue Farxiga 10 - Needs repeat echo - Stressed need to avoid running out of meds - Also discussed avoiding pregnancy and use contraception  2. HTN - Blood pressure well controlled. Continue current regimen.  3. Breast CA - s/p treatment with taxol and adriamycin    Glori Bickers, MD  11:17 AM

## 2020-05-13 ENCOUNTER — Other Ambulatory Visit: Payer: Self-pay

## 2020-05-13 ENCOUNTER — Telehealth: Payer: Self-pay

## 2020-05-13 ENCOUNTER — Inpatient Hospital Stay: Payer: Medicaid Other | Attending: Hematology and Oncology

## 2020-05-13 ENCOUNTER — Inpatient Hospital Stay: Payer: Medicaid Other

## 2020-05-13 VITALS — BP 121/80 | HR 69 | Resp 18

## 2020-05-13 DIAGNOSIS — Z923 Personal history of irradiation: Secondary | ICD-10-CM | POA: Diagnosis not present

## 2020-05-13 DIAGNOSIS — Z9013 Acquired absence of bilateral breasts and nipples: Secondary | ICD-10-CM | POA: Insufficient documentation

## 2020-05-13 DIAGNOSIS — Z5111 Encounter for antineoplastic chemotherapy: Secondary | ICD-10-CM | POA: Diagnosis not present

## 2020-05-13 DIAGNOSIS — C7951 Secondary malignant neoplasm of bone: Secondary | ICD-10-CM | POA: Insufficient documentation

## 2020-05-13 DIAGNOSIS — C50812 Malignant neoplasm of overlapping sites of left female breast: Secondary | ICD-10-CM

## 2020-05-13 DIAGNOSIS — Z9221 Personal history of antineoplastic chemotherapy: Secondary | ICD-10-CM | POA: Insufficient documentation

## 2020-05-13 DIAGNOSIS — Z79899 Other long term (current) drug therapy: Secondary | ICD-10-CM | POA: Insufficient documentation

## 2020-05-13 DIAGNOSIS — Z79811 Long term (current) use of aromatase inhibitors: Secondary | ICD-10-CM | POA: Insufficient documentation

## 2020-05-13 DIAGNOSIS — Z170 Estrogen receptor positive status [ER+]: Secondary | ICD-10-CM | POA: Diagnosis not present

## 2020-05-13 DIAGNOSIS — Z95828 Presence of other vascular implants and grafts: Secondary | ICD-10-CM

## 2020-05-13 DIAGNOSIS — Z7984 Long term (current) use of oral hypoglycemic drugs: Secondary | ICD-10-CM | POA: Insufficient documentation

## 2020-05-13 LAB — CMP (CANCER CENTER ONLY)
ALT: 14 U/L (ref 0–44)
AST: 15 U/L (ref 15–41)
Albumin: 3.9 g/dL (ref 3.5–5.0)
Alkaline Phosphatase: 79 U/L (ref 38–126)
Anion gap: 7 (ref 5–15)
BUN: 11 mg/dL (ref 6–20)
CO2: 24 mmol/L (ref 22–32)
Calcium: 8.3 mg/dL — ABNORMAL LOW (ref 8.9–10.3)
Chloride: 108 mmol/L (ref 98–111)
Creatinine: 0.68 mg/dL (ref 0.44–1.00)
GFR, Estimated: >60 mL/min (ref >60)
Glucose, Bld: 97 mg/dL (ref 70–99)
Potassium: 4 mmol/L (ref 3.5–5.1)
Sodium: 139 mmol/L (ref 135–145)
Total Bilirubin: 0.2 mg/dL — ABNORMAL LOW (ref 0.3–1.2)
Total Protein: 7.2 g/dL (ref 6.5–8.1)

## 2020-05-13 LAB — CBC WITH DIFFERENTIAL (CANCER CENTER ONLY)
Abs Immature Granulocytes: 0.01 10*3/uL (ref 0.00–0.07)
Basophils Absolute: 0.1 10*3/uL (ref 0.0–0.1)
Basophils Relative: 2 %
Eosinophils Absolute: 0 10*3/uL (ref 0.0–0.5)
Eosinophils Relative: 1 %
HCT: 37.4 % (ref 36.0–46.0)
Hemoglobin: 11.7 g/dL — ABNORMAL LOW (ref 12.0–15.0)
Immature Granulocytes: 0 %
Lymphocytes Relative: 43 %
Lymphs Abs: 1.3 10*3/uL (ref 0.7–4.0)
MCH: 29.2 pg (ref 26.0–34.0)
MCHC: 31.3 g/dL (ref 30.0–36.0)
MCV: 93.3 fL (ref 80.0–100.0)
Monocytes Absolute: 0.4 10*3/uL (ref 0.1–1.0)
Monocytes Relative: 12 %
Neutro Abs: 1.3 10*3/uL — ABNORMAL LOW (ref 1.7–7.7)
Neutrophils Relative %: 42 %
Platelet Count: 271 10*3/uL (ref 150–400)
RBC: 4.01 MIL/uL (ref 3.87–5.11)
RDW: 17.1 % — ABNORMAL HIGH (ref 11.5–15.5)
WBC Count: 3 10*3/uL — ABNORMAL LOW (ref 4.0–10.5)
nRBC: 0 % (ref 0.0–0.2)

## 2020-05-13 MED ORDER — GOSERELIN ACETATE 3.6 MG ~~LOC~~ IMPL
3.6000 mg | DRUG_IMPLANT | Freq: Once | SUBCUTANEOUS | Status: AC
  Administered 2020-05-13: 3.6 mg via SUBCUTANEOUS

## 2020-05-13 MED ORDER — GOSERELIN ACETATE 3.6 MG ~~LOC~~ IMPL
DRUG_IMPLANT | SUBCUTANEOUS | Status: AC
  Filled 2020-05-13: qty 3.6

## 2020-05-13 NOTE — Patient Instructions (Signed)

## 2020-05-13 NOTE — Telephone Encounter (Signed)
MD recommendations for patient to continue Ibrance @ 100mg  with ANC count of 1.3 today, with labs to be drawn prior to start of next cycle.   RN notified patient, patient verbalized understanding and agreement.  Appointments are scheduled for 3/30 for labs/follow up.

## 2020-05-13 NOTE — Telephone Encounter (Signed)
Pt called to follow up with being set up for bilateral breast prosthesis.    RN faxed dispensing order for bilateral breast prosthesis to [facility].    RN notified patient, verbalized understanding.  RN provided patient with hard script copy, and contact information to follow up with [facility].  No further needs at this time.

## 2020-05-17 ENCOUNTER — Telehealth (HOSPITAL_COMMUNITY): Payer: Self-pay | Admitting: Pharmacy Technician

## 2020-05-17 NOTE — Telephone Encounter (Signed)
Patient Advocate Encounter   Received notification from [facility] that prior authorization for Wilder Glade is required.   PA submitted on CoverMyMeds Key [id number] Status is pending   Will continue to follow.

## 2020-05-17 NOTE — Telephone Encounter (Signed)
Message to pharmacy team to verify if medication change is needed

## 2020-05-23 NOTE — Telephone Encounter (Signed)
Advanced Heart Failure Patient Advocate Encounter  Prior Authorization for Wilder Glade has been approved.    PA#  [id number] Effective dates: 05/17/20 through further notice  Charlann Boxer, CPhT

## 2020-05-26 ENCOUNTER — Ambulatory Visit (HOSPITAL_COMMUNITY)
Admission: RE | Admit: 2020-05-26 | Discharge: 2020-05-26 | Disposition: A | Discharge status: Home / Self Care | Payer: Medicaid Other | Source: Ambulatory Visit | Attending: Nurse Practitioner | Admitting: Nurse Practitioner

## 2020-05-26 ENCOUNTER — Other Ambulatory Visit: Payer: Self-pay

## 2020-05-26 DIAGNOSIS — I5022 Chronic systolic (congestive) heart failure: Secondary | ICD-10-CM | POA: Diagnosis not present

## 2020-05-26 DIAGNOSIS — I081 Rheumatic disorders of both mitral and tricuspid valves: Secondary | ICD-10-CM | POA: Diagnosis not present

## 2020-05-26 DIAGNOSIS — C50919 Malignant neoplasm of unspecified site of unspecified female breast: Secondary | ICD-10-CM | POA: Insufficient documentation

## 2020-05-26 DIAGNOSIS — Z87891 Personal history of nicotine dependence: Secondary | ICD-10-CM | POA: Insufficient documentation

## 2020-05-26 LAB — ECHOCARDIOGRAM COMPLETE
Area-P 1/2: 3.12 cm2
Calc EF: 48.6 %
S' Lateral: 4.3 cm
Single Plane A2C EF: 47.5 %
Single Plane A4C EF: 51.1 %

## 2020-05-26 NOTE — Progress Notes (Signed)
  Echocardiogram 2D Echocardiogram has been performed.  Geoffery Lyons [place] 05/26/2020, 1:40 PM

## 2020-06-01 MED FILL — IBRANCE 100 MG TABS: 100 | 28 days supply | Qty: 21 | Fill #1

## 2020-06-06 ENCOUNTER — Encounter: Payer: Self-pay | Admitting: *Deleted

## 2020-06-06 NOTE — Progress Notes (Signed)
Received call from pt stating insurance is not accepted at [facility].  States prescription for mastectomy bra's needing to be sent to [facility] in [place].  Prescription successfully faxed to [phone].

## 2020-06-07 NOTE — Assessment & Plan Note (Addendum)
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 Neoadjuvant chemotherapy with dose dense Adriamycin and Cytoxan given every 3 weeks without Neulasta support given her pregnancycompleted 05/28/2019 2. Post-Partumweekly Taxol x12starting 07/03/2019 3. Bilateral mastectomy 10/08/2019: Right mastectomy: Multifocal grade 2 IDC 0.8 cm and 0.6 cm with high-grade DCIS, margins negative, 0/4lymph nodes ER 95%, PR 5%, Ki-67 10%, HER-2 negative;left mastectomy: IDC 0.6 cm, LVI, margins negative, 1/4 lymph nodes positive, ER 60%, PR 60%, HER-2 negative, Ki-67 50% RCB class II 4. Adjuvant radiation therapy 5. Followed by adjuvant antiestrogen therapy with complete estrogen blockade (ovarian suppression with AI) CT C/A/P on 5/13 shows sclerotic bone lesions, bone scan negative 6.DeclinedCDK 4 and 6 inhibitor abemaciclibwith antiestrogen therapy ---------------------------------------------------------------------------------------------------------------------------------------------------- Bone scan 04/01/2020: Interval development of multifocal abnormal areas of increased tracer uptake compatible with diffuse bone metastases bony pelvis, right acetabulum, left pubic bone, anterior left lower rib, thoracic and upper lumbar spine including T6, T9, L1, intertrochanteric region of right femur  03/31/2020: CT CAP: Bone metastases. No visceral mets.  Treatment plan: Faslodex+letrozole +Ibrance plus Zoladex along with  Xgeva  Ibrance Toxicities: Neutropenia: Stable white blood cell count on 100 mg of Ibrance.  She is tolerating it very well.  Our plan is to obtain CT CAP and bone scan in 1 month and follow-up after that. She will receive her injection today. She wishes to undergo breast reconstruction.  I sent consultation request for Dr. Thimmappa.  Return to clinic in 1 month for follow-up after scans.  

## 2020-06-07 NOTE — Progress Notes (Signed)
Patient Care Team: Melissa Retort, NP as PCP - General (Nurse Practitioner) Melissa Kaufmann, RN as Oncology Nurse Navigator Melissa Germany, RN as Oncology Nurse Navigator Melissa Bookbinder, MD as Surgeon (General Surgery) Melissa Lose, MD as Medical Oncologist (Hematology and Oncology)  DIAGNOSIS:    ICD-10-CM   1. Metastatic breast cancer ([place])  C50.919 CT CHEST ABDOMEN PELVIS W CONTRAST    NM Bone Scan Whole Body  2. Malignant neoplasm of overlapping sites of left breast in female, estrogen receptor positive ([place])  C50.812 CT CHEST ABDOMEN PELVIS W CONTRAST   Z17.0 NM Bone Scan Whole Body    SUMMARY OF ONCOLOGIC HISTORY: Oncology History  Malignant neoplasm of overlapping sites of left breast in female, estrogen receptor positive ([place])  02/26/2019 Initial Diagnosis   Pregnant lady with 63-month history of left breast swelling and skin thickening, mammogram showed pleomorphic calcifications 8 cm, axilla lymph node biopsy positive ER 60%, PR 60%, Ki-67 50%, HER-2 negative ratio 1.25, anterior and posterior end of the calcifications biopsied: 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 ER 50%, PR 30%, Ki-67 50%, HER-2 negative ratio 1.03   03/04/2019 Cancer Staging   Staging form: Breast, AJCC 8th Edition - Clinical stage from 03/04/2019: Stage IIIB (cT4, cN1, cM0, G3, ER+, PR+, HER2-) - Signed by Melissa Lose, MD on 03/04/2019    Neo-Adjuvant Chemotherapy   Adriamycin and Cytoxan x 4 given every three weeks without Neulasta support., followed by weekly Taxol x 12 versus Taxotere Cytoxan after her delivery   04/15/2019 Genetic Testing   Negative genetic testing:  No pathogenic variants detected on the [employer] Breast Cancer Guidelines-Based Panel or the Common Hereditary Cancers Panel. The report date is 04/15/2019.  The Breast Cancer Guidelines-Based panel offered by [employer] includes sequencing and rearrangement analysis for the following 11 genes:  ATM, BRCA1, BRCA2, CDH1,  CHEK2, NBN, NF1, PALB2, PTEN, STK11 and TP53.  The Common Hereditary Cancers Panel offered by [facility] includes sequencing and/or deletion duplication testing of the following 48 genes: APC, ATM, AXIN2, BARD1, BMPR1A, BRCA1, BRCA2, BRIP1, CDH1, CDK4, CDKN2A (p14ARF), CDKN2A (p16INK4a), CHEK2, CTNNA1, DICER1, EPCAM (Deletion/duplication testing only), GREM1 (promoter region deletion/duplication testing only), KIT, MEN1, MLH1, MSH2, MSH3, MSH6, MUTYH, NBN, NF1, NHTL1, PALB2, PDGFRA, PMS2, POLD1, POLE, PTEN, RAD50, RAD51C, RAD51D, RNF43, SDHB, SDHC, SDHD, SMAD4, SMARCA4. STK11, TP53, TSC1, TSC2, and VHL.  The following genes were evaluated for sequence changes only: SDHA and HOXB13 c.251G>A variant only.    10/08/2019 Surgery   Bilateral mastectomies Melissa [name]):  Right mastectomy: Multifocal grade 2 IDC 0.8 cm and 0.6 cm with high-grade DCIS, margins negative, 0/4 lymph nodes ER 95%, PR 5%, Ki-67 10%, HER-2 negative;  Left mastectomy: IDC 0.6 cm, LVI, margins negative, 1/4 lymph nodes positive, ER 60%, PR 60%, HER-2 negative, Ki-67 50% RCB class II   11/30/2019 - 01/08/2020 Radiation Therapy   Adjuvant radiation    Anti-estrogen oral therapy   Ibrance Letrozole, Zolodex, Xgeva     CHIEF COMPLIANT: Follow-up of metastatic breast cancer  INTERVAL HISTORY: Melissa [name] is a 30 y.o. with above-mentioned history of metastatic breast cancerwith osseous metastases who is currently on treatment with Faslodex, letrozole,Ibrance plus Zoladex along with Xgeva. Echo on 05/26/20 showed an ejection fraction of 45-50%. She presents to the clinic todayfor follow-up.   She is tolerating 100 mg of Ibrance extremely well.  She does not have any pain or discomfort in the bones.  ALLERGIES:  has No Known Allergies.  MEDICATIONS:  Current Outpatient Medications  Medication Sig Dispense Refill  . anastrozole (ARIMIDEX) 1 MG tablet Take 1 tablet (1 mg total) by mouth daily. 90 tablet 3  . carvedilol (COREG) 6.25 MG  tablet Take 1 tablet (6.25 mg total) by mouth 2 (two) times daily. 60 tablet 11  . FARXIGA 10 MG TABS tablet TAKE 1 TABLET BY MOUTH DAILY BEFORE BREAKFAST. 30 tablet 11  . furosemide (LASIX) 20 MG tablet Take 1 tablet (20 mg total) by mouth daily as needed for fluid or edema. 90 tablet 2  . gabapentin (NEURONTIN) 100 MG capsule Take 1 capsule (100 mg total) by mouth at bedtime. 30 capsule 0  . palbociclib (IBRANCE) 100 MG tablet Take 1 tablet (100 mg total) by mouth daily. Take for 21 days on, 7 days off, repeat every 28 days. 21 tablet 6  . sacubitril-valsartan (ENTRESTO) 97-103 MG Take 1 tablet by mouth 2 (two) times daily. 60 tablet 11  . traMADol (ULTRAM) 50 MG tablet Take 50 mg by mouth every 6 (six) hours as needed.     No current facility-administered medications for this visit.    PHYSICAL EXAMINATION: ECOG PERFORMANCE STATUS: 1 - Symptomatic but completely ambulatory  Vitals:   06/08/20 1110  BP: 110/72  Pulse: 73  Resp: 20  Temp: (!) 97.5 F (36.4 C)  SpO2: 100%   Filed Weights   06/08/20 1110  Weight: 180 lb 4.8 oz (81.8 kg)    LABORATORY DATA:  I have reviewed the data as listed CMP Latest Ref Rng & Units 05/13/2020 05/12/2020 05/06/2020  Glucose 70 - 99 mg/dL 97 94 99  BUN 6 - 20 mg/dL $Remove'11 11 12  'gIgyEtq$ Creatinine 0.44 - 1.00 mg/dL 0.68 0.60 0.70  Sodium 135 - 145 mmol/L 139 139 139  Potassium 3.5 - 5.1 mmol/L 4.0 4.4 4.1  Chloride 98 - 111 mmol/L 108 107 108  CO2 22 - 32 mmol/L $RemoveB'24 24 25  'LYaMNqiV$ Calcium 8.9 - 10.3 mg/dL 8.3(L) 8.7(L) 8.5(L)  Total Protein 6.5 - 8.1 g/dL 7.2 - 7.5  Total Bilirubin 0.3 - 1.2 mg/dL 0.2(L) - 0.3  Alkaline Phos 38 - 126 U/L 79 - 79  AST 15 - 41 U/L 15 - 14(L)  ALT 0 - 44 U/L 14 - 12    Lab Results  Component Value Date   WBC 3.4 (L) 06/08/2020   HGB 13.5 06/08/2020   HCT 40.9 06/08/2020   MCV 93.4 06/08/2020   PLT 148 (L) 06/08/2020   NEUTROABS PENDING 06/08/2020    ASSESSMENT & PLAN:  Malignant neoplasm of overlapping sites of left  breast in female, estrogen receptor positive ([place]) 02/26/2019:Pregnant lady with 42-month history of left breast swelling and skin thickening, mammogram showed pleomorphic calcifications 8 cm, axilla lymph node biopsy positive ER 60%, PR 60%, Ki-67 50%, HER-2 negative ratio 1.25, anterior and posterior end of the calcifications biopsied: 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 Neoadjuvant chemotherapy with dose dense Adriamycin and Cytoxan given every 3 weeks without Neulasta support given her pregnancycompleted 05/28/2019 2. Post-Partumweekly Taxol x12starting 07/03/2019 3. Bilateral mastectomy 10/08/2019: Right mastectomy: Multifocal grade 2 IDC 0.8 cm and 0.6 cm with high-grade DCIS, margins negative, 0/4lymph nodes ER 95%, PR 5%, Ki-67 10%, HER-2 negative;left mastectomy: IDC 0.6 cm, LVI, margins negative, 1/4 lymph nodes positive, ER 60%, PR 60%, HER-2 negative, Ki-67 50%  RCB class II 4. Adjuvant radiation therapy 5. Followed by adjuvant antiestrogen therapy with complete estrogen blockade (ovarian suppression with AI) CT C/A/P on 5/13 shows sclerotic bone lesions, bone scan negative 6.DeclinedCDK 4 and 6 inhibitor abemaciclibwith antiestrogen therapy ---------------------------------------------------------------------------------------------------------------------------------------------------- Bone scan 04/01/2020: Interval development of multifocal abnormal areas of increased tracer uptake compatible with diffuse bone metastases bony pelvis, right acetabulum, left pubic bone, anterior left lower rib, thoracic and upper lumbar spine including T6, T9, L1, intertrochanteric region of right femur  03/31/2020: CT CAP: Bone metastases. No visceral mets.  Treatment plan:  Faslodex+letrozole +Ibrance plus Zoladex along with [place] Toxicities: Neutropenia: Stable white blood cell count on 100 mg of Ibrance.  She is tolerating it very well.  Our plan is to obtain CT CAP and bone scan in 1 month and follow-up after that. She will receive her injection today. She wishes to undergo breast reconstruction.  I sent consultation request for Dr. Iran Planas.  Return to clinic in 1 month for follow-up after scans.     Orders Placed This Encounter  Procedures  . CT CHEST ABDOMEN PELVIS W CONTRAST    Standing Status:   Future    Standing Expiration Date:   06/08/2021    Order Specific Question:   If indicated for the ordered procedure, I authorize the administration of contrast media per Radiology protocol    Answer:   Yes    Order Specific Question:   Is patient pregnant?    Answer:   No    Order Specific Question:   Preferred imaging location?    Answer:   [facility]    Order Specific Question:   Release to patient    Answer:   Immediate    Order Specific Question:   Is Oral Contrast requested for this exam?    Answer:   Yes, Per Radiology protocol    Order Specific Question:   Reason for Exam (SYMPTOM  OR DIAGNOSIS REQUIRED)    Answer:   Restaging Met breast cancer  . NM Bone Scan Whole Body    Standing Status:   Future    Standing Expiration Date:   06/08/2021    Order Specific Question:   If indicated for the ordered procedure, I authorize the administration of a radiopharmaceutical per Radiology protocol    Answer:   Yes    Order Specific Question:   Is the patient pregnant?    Answer:   No    Order Specific Question:   Preferred imaging location?    Answer:   [facility]    Order Specific Question:   Release to patient    Answer:   Immediate   The patient has a good understanding of the overall plan. she agrees with it. she will call with any problems that may develop before the next visit here.  Total time spent: 20  mins including face to face time and time spent for planning, charting and coordination of care  Rulon Eisenmenger, MD, MPH 06/08/2020  I, Molly Dorshimer, am acting as scribe for Dr. Nicholas [name].  I have reviewed the above documentation for accuracy and completeness, and I agree with the above.

## 2020-06-08 ENCOUNTER — Other Ambulatory Visit: Payer: Self-pay

## 2020-06-08 ENCOUNTER — Inpatient Hospital Stay: Payer: Medicaid Other

## 2020-06-08 ENCOUNTER — Inpatient Hospital Stay (HOSPITAL_BASED_OUTPATIENT_CLINIC_OR_DEPARTMENT_OTHER): Payer: Medicaid Other | Admitting: Hematology and Oncology

## 2020-06-08 VITALS — BP 110/72 | HR 73 | Temp 97.5°F | Resp 20 | Ht 62.0 in | Wt 180.3 lb

## 2020-06-08 DIAGNOSIS — C50919 Malignant neoplasm of unspecified site of unspecified female breast: Secondary | ICD-10-CM | POA: Diagnosis not present

## 2020-06-08 DIAGNOSIS — C50812 Malignant neoplasm of overlapping sites of left female breast: Secondary | ICD-10-CM | POA: Diagnosis not present

## 2020-06-08 DIAGNOSIS — Z170 Estrogen receptor positive status [ER+]: Secondary | ICD-10-CM

## 2020-06-08 DIAGNOSIS — Z5111 Encounter for antineoplastic chemotherapy: Secondary | ICD-10-CM | POA: Diagnosis not present

## 2020-06-08 DIAGNOSIS — Z95828 Presence of other vascular implants and grafts: Secondary | ICD-10-CM

## 2020-06-08 LAB — CMP (CANCER CENTER ONLY)
ALT: 14 U/L (ref 0–44)
AST: 14 U/L — ABNORMAL LOW (ref 15–41)
Albumin: 4.3 g/dL (ref 3.5–5.0)
Alkaline Phosphatase: 69 U/L (ref 38–126)
Anion gap: 10 (ref 5–15)
BUN: 14 mg/dL (ref 6–20)
CO2: 27 mmol/L (ref 22–32)
Calcium: 9 mg/dL (ref 8.9–10.3)
Chloride: 105 mmol/L (ref 98–111)
Creatinine: 0.77 mg/dL (ref 0.44–1.00)
GFR, Estimated: >60 mL/min (ref >60)
Glucose, Bld: 91 mg/dL (ref 70–99)
Potassium: 4.2 mmol/L (ref 3.5–5.1)
Sodium: 142 mmol/L (ref 135–145)
Total Bilirubin: 0.4 mg/dL (ref 0.3–1.2)
Total Protein: 8.3 g/dL — ABNORMAL HIGH (ref 6.5–8.1)

## 2020-06-08 LAB — CBC WITH DIFFERENTIAL (CANCER CENTER ONLY)
Abs Immature Granulocytes: 0.01 10*3/uL (ref 0.00–0.07)
Basophils Absolute: 0.1 10*3/uL (ref 0.0–0.1)
Basophils Relative: 2 %
Eosinophils Absolute: 0 10*3/uL (ref 0.0–0.5)
Eosinophils Relative: 1 %
HCT: 40.9 % (ref 36.0–46.0)
Hemoglobin: 13.5 g/dL (ref 12.0–15.0)
Immature Granulocytes: 0 %
Lymphocytes Relative: 30 %
Lymphs Abs: 1 10*3/uL (ref 0.7–4.0)
MCH: 30.8 pg (ref 26.0–34.0)
MCHC: 33 g/dL (ref 30.0–36.0)
MCV: 93.4 fL (ref 80.0–100.0)
Monocytes Absolute: 0.2 10*3/uL (ref 0.1–1.0)
Monocytes Relative: 7 %
Neutro Abs: 2.1 10*3/uL (ref 1.7–7.7)
Neutrophils Relative %: 60 %
Platelet Count: 148 10*3/uL — ABNORMAL LOW (ref 150–400)
RBC: 4.38 MIL/uL (ref 3.87–5.11)
RDW: 17.7 % — ABNORMAL HIGH (ref 11.5–15.5)
WBC Count: 3.4 10*3/uL — ABNORMAL LOW (ref 4.0–10.5)
nRBC: 0 % (ref 0.0–0.2)

## 2020-06-08 MED ORDER — GOSERELIN ACETATE 3.6 MG ~~LOC~~ IMPL
DRUG_IMPLANT | SUBCUTANEOUS | Status: AC
  Filled 2020-06-08: qty 3.6

## 2020-06-08 MED ORDER — GOSERELIN ACETATE 3.6 MG ~~LOC~~ IMPL
3.6000 mg | DRUG_IMPLANT | Freq: Once | SUBCUTANEOUS | Status: AC
  Administered 2020-06-08: 3.6 mg via SUBCUTANEOUS

## 2020-06-08 NOTE — Patient Instructions (Signed)

## 2020-06-09 ENCOUNTER — Other Ambulatory Visit (HOSPITAL_COMMUNITY): Payer: Self-pay

## 2020-06-10 ENCOUNTER — Telehealth: Payer: Self-pay | Admitting: Hematology and Oncology

## 2020-06-10 NOTE — Telephone Encounter (Signed)
Scheduled per 3/30 los. Called and spoke with pt, confirmed 4/26 and 4/27 appts

## 2020-06-14 ENCOUNTER — Encounter: Payer: Self-pay | Admitting: Licensed Clinical Social Worker

## 2020-06-14 NOTE — Progress Notes (Signed)
[place] CSW Progress Note  Clinical Education officer, museum contacted patient by phone to provide ongoing supportive counseling.  Pt reports that she is still trying to stay positive and hopeful, but is nervous about CT at the end of the month and has moments where she feels sad when thinking about if she will be around for certain things in the future (ie: with her girls, a wedding she is invited to next year).  CSW normalized feelings and discussed balancing those worries with continuing to live her life as she is still feeling good.  Pt has maintained healthy habits of not drinking or smoking. There is some difficulty maintaining motivation to go to the gym and she has been snacking when stressed. Briefly discussed stress management strategies. She has done well setting boundaries with people in her life who cause negativity, although is still dealing with her children's father which can be stressful.    CSW will continue to check-in periodically. Patient may call as needed for support.    Christeen Douglas , LCSW

## 2020-06-23 ENCOUNTER — Other Ambulatory Visit (HOSPITAL_COMMUNITY): Payer: Self-pay

## 2020-06-23 MED FILL — Palbociclib Tab 100 MG: ORAL | 28 days supply | Qty: 21 | Fill #0 | Status: AC

## 2020-06-29 ENCOUNTER — Other Ambulatory Visit (HOSPITAL_COMMUNITY): Payer: Self-pay

## 2020-07-05 ENCOUNTER — Other Ambulatory Visit: Payer: Self-pay

## 2020-07-05 ENCOUNTER — Ambulatory Visit (HOSPITAL_COMMUNITY): Payer: Medicaid Other

## 2020-07-05 ENCOUNTER — Inpatient Hospital Stay: Payer: Medicaid Other | Attending: Hematology and Oncology

## 2020-07-05 DIAGNOSIS — Z170 Estrogen receptor positive status [ER+]: Secondary | ICD-10-CM | POA: Insufficient documentation

## 2020-07-05 DIAGNOSIS — Z9013 Acquired absence of bilateral breasts and nipples: Secondary | ICD-10-CM | POA: Diagnosis not present

## 2020-07-05 DIAGNOSIS — D709 Neutropenia, unspecified: Secondary | ICD-10-CM | POA: Diagnosis not present

## 2020-07-05 DIAGNOSIS — Z79899 Other long term (current) drug therapy: Secondary | ICD-10-CM | POA: Insufficient documentation

## 2020-07-05 DIAGNOSIS — C7951 Secondary malignant neoplasm of bone: Secondary | ICD-10-CM | POA: Insufficient documentation

## 2020-07-05 DIAGNOSIS — Z5111 Encounter for antineoplastic chemotherapy: Secondary | ICD-10-CM | POA: Diagnosis not present

## 2020-07-05 DIAGNOSIS — C50812 Malignant neoplasm of overlapping sites of left female breast: Secondary | ICD-10-CM | POA: Insufficient documentation

## 2020-07-05 LAB — CMP (CANCER CENTER ONLY)
ALT: 14 U/L (ref 0–44)
AST: 17 U/L (ref 15–41)
Albumin: 4.1 g/dL (ref 3.5–5.0)
Alkaline Phosphatase: 48 U/L (ref 38–126)
Anion gap: 9 (ref 5–15)
BUN: 15 mg/dL (ref 6–20)
CO2: 25 mmol/L (ref 22–32)
Calcium: 8.6 mg/dL — ABNORMAL LOW (ref 8.9–10.3)
Chloride: 107 mmol/L (ref 98–111)
Creatinine: 0.83 mg/dL (ref 0.44–1.00)
GFR, Estimated: >60 mL/min (ref >60)
Glucose, Bld: 96 mg/dL (ref 70–99)
Potassium: 3.9 mmol/L (ref 3.5–5.1)
Sodium: 141 mmol/L (ref 135–145)
Total Bilirubin: 0.5 mg/dL (ref 0.3–1.2)
Total Protein: 7.3 g/dL (ref 6.5–8.1)

## 2020-07-05 LAB — CBC WITH DIFFERENTIAL (CANCER CENTER ONLY)
Abs Immature Granulocytes: 0 10*3/uL (ref 0.00–0.07)
Basophils Absolute: 0 10*3/uL (ref 0.0–0.1)
Basophils Relative: 2 %
Eosinophils Absolute: 0 10*3/uL (ref 0.0–0.5)
Eosinophils Relative: 2 %
HCT: 36.4 % (ref 36.0–46.0)
Hemoglobin: 12.2 g/dL (ref 12.0–15.0)
Immature Granulocytes: 0 %
Lymphocytes Relative: 48 %
Lymphs Abs: 1 10*3/uL (ref 0.7–4.0)
MCH: 31.8 pg (ref 26.0–34.0)
MCHC: 33.5 g/dL (ref 30.0–36.0)
MCV: 94.8 fL (ref 80.0–100.0)
Monocytes Absolute: 0.1 10*3/uL (ref 0.1–1.0)
Monocytes Relative: 7 %
Neutro Abs: 0.8 10*3/uL — ABNORMAL LOW (ref 1.7–7.7)
Neutrophils Relative %: 41 %
Platelet Count: 161 10*3/uL (ref 150–400)
RBC: 3.84 MIL/uL — ABNORMAL LOW (ref 3.87–5.11)
RDW: 17.2 % — ABNORMAL HIGH (ref 11.5–15.5)
WBC Count: 2 10*3/uL — ABNORMAL LOW (ref 4.0–10.5)
nRBC: 0 % (ref 0.0–0.2)

## 2020-07-05 NOTE — Progress Notes (Signed)
Patient Care Team: [name], NP as PCP - General (Nurse Practitioner) [name], RN as Oncology Nurse Navigator Rockwell Germany, RN as Oncology Nurse Navigator Rolm Bookbinder, MD as Surgeon (General Surgery) Nicholas Lose, MD as Medical Oncologist (Hematology and Oncology)  DIAGNOSIS:    ICD-10-CM   1. 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 ER 50%, PR 30%, Ki-67 50%, HER-2 negative ratio 1.03   03/04/2019 Cancer Staging   Staging form: Breast, AJCC 8th Edition - Clinical stage from 03/04/2019: Stage IIIB (cT4, cN1, cM0, G3, ER+, PR+, HER2-) - Signed by Nicholas Lose, MD on 03/04/2019    Neo-Adjuvant Chemotherapy   Adriamycin and Cytoxan x 4 given every three weeks without Neulasta support., followed by weekly Taxol x 12 versus Taxotere Cytoxan after her delivery   04/15/2019 Genetic Testing   Negative genetic testing:  No pathogenic variants detected on the [employer] Breast Cancer Guidelines-Based Panel or the Common Hereditary Cancers Panel. The report date is 04/15/2019.  The Breast Cancer Guidelines-Based panel offered by [facility] includes sequencing and rearrangement analysis for the following 11 genes:  ATM, BRCA1, BRCA2, CDH1, CHEK2, NBN, NF1, PALB2, PTEN, STK11 and TP53.  The Common Hereditary Cancers Panel offered by [facility] includes sequencing and/or deletion duplication testing of the  following 48 genes: APC, ATM, AXIN2, BARD1, BMPR1A, BRCA1, BRCA2, BRIP1, CDH1, CDK4, CDKN2A (p14ARF), CDKN2A (p16INK4a), CHEK2, CTNNA1, DICER1, EPCAM (Deletion/duplication testing only), GREM1 (promoter region deletion/duplication testing only), KIT, MEN1, MLH1, MSH2, MSH3, MSH6, MUTYH, NBN, NF1, NHTL1, PALB2, PDGFRA, PMS2, POLD1, POLE, PTEN, RAD50, RAD51C, RAD51D, RNF43, SDHB, SDHC, SDHD, SMAD4, SMARCA4. STK11, TP53, TSC1, TSC2, and VHL.  The following genes were evaluated for sequence changes only: SDHA and HOXB13 c.251G>A variant only.    10/08/2019 Surgery   Bilateral mastectomies [name]):  Right mastectomy: Multifocal grade 2 IDC 0.8 cm and 0.6 cm with high-grade DCIS, margins negative, 0/4 lymph nodes ER 95%, PR 5%, Ki-67 10%, HER-2 negative;  Left mastectomy: IDC 0.6 cm, LVI, margins negative, 1/4 lymph nodes positive, ER 60%, PR 60%, HER-2 negative, Ki-67 50% RCB class II   11/30/2019 - 01/08/2020 Radiation Therapy   Adjuvant radiation    Anti-estrogen oral therapy   Ibrance Letrozole, Zolodex, Xgeva     CHIEF COMPLIANT: Follow-up of metastatic breast cancer  INTERVAL HISTORY: Melissa [name] is a 30 y.o. with above-mentioned history of metastaticbreast cancerwith osseous metastases who is currently on treatment withFaslodex,letrozole,Ibrance plus Zoladex along with Xgeva. She presents to the clinic todayfor follow-up.    ALLERGIES:  has No Known Allergies.  MEDICATIONS:  Current Outpatient Medications  Medication Sig Dispense Refill  . anastrozole (ARIMIDEX) 1 MG tablet Take 1 tablet (1 mg total) by mouth daily. 90 tablet 3  . carvedilol (COREG) 6.25 MG tablet Take 1 tablet (6.25 mg total) by mouth 2 (two) times daily. 60 tablet 11  . FARXIGA 10 MG TABS tablet TAKE 1 TABLET BY MOUTH DAILY BEFORE BREAKFAST. 30 tablet 11  .  furosemide (LASIX) 20 MG tablet Take 1 tablet (20 mg total) by mouth daily as needed for fluid or edema. 90 tablet 2  . gabapentin (NEURONTIN) 100 MG  capsule Take 1 capsule (100 mg total) by mouth at bedtime. 30 capsule 0  . palbociclib (IBRANCE) 100 MG tablet TAKE 1 TABLET BY MOUTH DAILY. TAKE FOR 21 DAYS ON, 7 DAYS OFF, REPEAT EVERY 28 DAYS. 21 tablet 6  . sacubitril-valsartan (ENTRESTO) 97-103 MG Take 1 tablet by mouth 2 (two) times daily. 60 tablet 11  . traMADol (ULTRAM) 50 MG tablet Take 50 mg by mouth every 6 (six) hours as needed.     No current facility-administered medications for this visit.    PHYSICAL EXAMINATION: ECOG PERFORMANCE STATUS: 1 - Symptomatic but completely ambulatory  Vitals:   07/06/20 1107  BP: 107/70  Pulse: (!) 55  Resp: 19  Temp: 97.6 F (36.4 C)  SpO2: 100%   Filed Weights   07/06/20 1107  Weight: 181 lb 6.4 oz (82.3 kg)    LABORATORY DATA:  I have reviewed the data as listed CMP Latest Ref Rng & Units 07/05/2020 06/08/2020 05/13/2020  Glucose 70 - 99 mg/dL 96 91 97  BUN 6 - 20 mg/dL $Remove'15 14 11  'JqrUktB$ Creatinine 0.44 - 1.00 mg/dL 0.83 0.77 0.68  Sodium 135 - 145 mmol/L 141 142 139  Potassium 3.5 - 5.1 mmol/L 3.9 4.2 4.0  Chloride 98 - 111 mmol/L 107 105 108  CO2 22 - 32 mmol/L $RemoveB'25 27 24  'IBTimogg$ Calcium 8.9 - 10.3 mg/dL 8.6(L) 9.0 8.3(L)  Total Protein 6.5 - 8.1 g/dL 7.3 8.3(H) 7.2  Total Bilirubin 0.3 - 1.2 mg/dL 0.5 0.4 0.2(L)  Alkaline Phos 38 - 126 U/L 48 69 79  AST 15 - 41 U/L 17 14(L) 15  ALT 0 - 44 U/L $Remo'14 14 14    'EDkoR$ Lab Results  Component Value Date   WBC 2.0 (L) 07/05/2020   HGB 12.2 07/05/2020   HCT 36.4 07/05/2020   MCV 94.8 07/05/2020   PLT 161 07/05/2020   NEUTROABS 0.8 (L) 07/05/2020    ASSESSMENT & PLAN:  Malignant neoplasm of overlapping sites of left breast in female, estrogen receptor positive ([place]) 02/26/2019:Pregnant lady with 49-month history of left breast swelling and skin thickening, mammogram showed pleomorphic calcifications 8 cm, axilla lymph node biopsy positive ER 60%, PR 60%, Ki-67 50%, HER-2 negative ratio 1.25, anterior and posterior end of the calcifications biopsied:  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 Neoadjuvant chemotherapy with dose dense Adriamycin and Cytoxan given every 3 weeks without Neulasta support given her pregnancycompleted 05/28/2019 2. Post-Partumweekly Taxol x12starting 07/03/2019 3. Bilateral mastectomy 10/08/2019: Right mastectomy: Multifocal grade 2 IDC 0.8 cm and 0.6 cm with high-grade DCIS, margins negative, 0/4lymph nodes ER 95%, PR 5%, Ki-67 10%, HER-2 negative;left mastectomy: IDC 0.6 cm, LVI, margins negative, 1/4 lymph nodes positive, ER 60%, PR 60%, HER-2 negative, Ki-67 50% RCB class II 4. Adjuvant radiation therapy 5. Followed by adjuvant antiestrogen therapy with complete estrogen blockade (ovarian suppression with AI) CT C/A/P on 5/13 shows sclerotic bone lesions, bone scan negative 6.DeclinedCDK 4 and 6 inhibitor abemaciclibwith antiestrogen therapy ---------------------------------------------------------------------------------------------------------------------------------------------------- Bone scan 04/01/2020: Interval development of multifocal abnormal areas of increased tracer uptake compatible with diffuse bone metastases bony pelvis, right acetabulum, left pubic bone, anterior left  lower rib, thoracic and upper lumbar spine including T6, T9, L1, intertrochanteric region of right femur  03/31/2020: CT CAP: Bone metastases. No visceral mets.  Treatment plan: Faslodex+letrozole +Ibrance plus Zoladex along with Xgeva  IbranceToxicities: Neutropenia: Stable white blood cell count on 100 mg of Ibrance.  She is tolerating it very well.  07/15/20: CT CAP and bone scan She will receive her injection today.   I will call her on 07/18/2020 to discuss results of the CT and a bone scan.  This will be a  telephone visit. Teeth extraction: I instructed her to get her teeth extracted now because it has been at least 2 months since last Xgeva injection. We will hold off on giving Xgeva on 08/05/2020. I will see her back in person on 09/05/2020 with labs and injection appointments including Xgeva at that time if she is fully healed from the teeth extraction.    No orders of the defined types were placed in this encounter.  The patient has a good understanding of the overall plan. she agrees with it. she will call with any problems that may develop before the next visit here.  Total time spent: 20 mins including face to face time and time spent for planning, charting and coordination of care  Rulon Eisenmenger, MD, MPH 07/06/2020  I, Molly Dorshimer, am acting as scribe for Dr. Nicholas Lose.  I have reviewed the above documentation for accuracy and completeness, and I agree with the above.

## 2020-07-05 NOTE — Assessment & Plan Note (Signed)
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 Neoadjuvant chemotherapy with dose dense Adriamycin and Cytoxan given every 3 weeks without Neulasta support given her pregnancycompleted 05/28/2019 2. Post-Partumweekly Taxol x12starting 07/03/2019 3. Bilateral mastectomy 10/08/2019: Right mastectomy: Multifocal grade 2 IDC 0.8 cm and 0.6 cm with high-grade DCIS, margins negative, 0/4lymph nodes ER 95%, PR 5%, Ki-67 10%, HER-2 negative;left mastectomy: IDC 0.6 cm, LVI, margins negative, 1/4 lymph nodes positive, ER 60%, PR 60%, HER-2 negative, Ki-67 50% RCB class II 4. Adjuvant radiation therapy 5. Followed by adjuvant antiestrogen therapy with complete estrogen blockade (ovarian suppression with AI) CT C/A/P on 5/13 shows sclerotic bone lesions, bone scan negative 6.DeclinedCDK 4 and 6 inhibitor abemaciclibwith antiestrogen therapy ---------------------------------------------------------------------------------------------------------------------------------------------------- Bone scan 04/01/2020: Interval development of multifocal abnormal areas of increased tracer uptake compatible with diffuse bone metastases bony pelvis, right acetabulum, left pubic bone, anterior left lower rib, thoracic and upper lumbar spine including T6, T9, L1, intertrochanteric region of right femur  03/31/2020: CT CAP: Bone metastases. No visceral mets.  Treatment plan: Faslodex+letrozole +Ibrance plus Zoladex along with  Xgeva  IbranceToxicities: Neutropenia: Stable white blood cell count on 100 mg of Ibrance.  She is tolerating it very well.  07/15/20: CT CAP and bone scan She will receive her injection today. She wishes to undergo breast reconstruction.  I sent consultation request for Dr. Iran Planas.

## 2020-07-06 ENCOUNTER — Inpatient Hospital Stay: Payer: Medicaid Other

## 2020-07-06 ENCOUNTER — Inpatient Hospital Stay (HOSPITAL_BASED_OUTPATIENT_CLINIC_OR_DEPARTMENT_OTHER): Payer: Medicaid Other | Admitting: Hematology and Oncology

## 2020-07-06 VITALS — BP 107/65 | HR 57 | Resp 18

## 2020-07-06 DIAGNOSIS — C50812 Malignant neoplasm of overlapping sites of left female breast: Secondary | ICD-10-CM

## 2020-07-06 DIAGNOSIS — Z95828 Presence of other vascular implants and grafts: Secondary | ICD-10-CM

## 2020-07-06 DIAGNOSIS — Z170 Estrogen receptor positive status [ER+]: Secondary | ICD-10-CM

## 2020-07-06 DIAGNOSIS — Z5111 Encounter for antineoplastic chemotherapy: Secondary | ICD-10-CM | POA: Diagnosis not present

## 2020-07-06 MED ORDER — GOSERELIN ACETATE 3.6 MG ~~LOC~~ IMPL
DRUG_IMPLANT | SUBCUTANEOUS | Status: AC
  Filled 2020-07-06: qty 3.6

## 2020-07-06 MED ORDER — GOSERELIN ACETATE 3.6 MG ~~LOC~~ IMPL
3.6000 mg | DRUG_IMPLANT | Freq: Once | SUBCUTANEOUS | Status: AC
  Administered 2020-07-06: 3.6 mg via SUBCUTANEOUS

## 2020-07-06 NOTE — Patient Instructions (Signed)

## 2020-07-12 ENCOUNTER — Telehealth: Payer: Self-pay | Admitting: Hematology and Oncology

## 2020-07-12 NOTE — Telephone Encounter (Signed)
Sch per 4/27 los, Left message, calendar sent

## 2020-07-15 ENCOUNTER — Encounter (HOSPITAL_COMMUNITY)
Admission: RE | Admit: 2020-07-15 | Discharge: 2020-07-15 | Disposition: A | Discharge status: Home / Self Care | Payer: Medicaid Other | Source: Ambulatory Visit | Attending: Hematology and Oncology | Admitting: Hematology and Oncology

## 2020-07-15 ENCOUNTER — Encounter (HOSPITAL_COMMUNITY): Payer: Self-pay

## 2020-07-15 ENCOUNTER — Other Ambulatory Visit: Payer: Self-pay

## 2020-07-15 ENCOUNTER — Ambulatory Visit (HOSPITAL_COMMUNITY)
Admission: RE | Admit: 2020-07-15 | Discharge: 2020-07-15 | Disposition: A | Discharge status: Home / Self Care | Payer: Medicaid Other | Source: Ambulatory Visit | Attending: Hematology and Oncology | Admitting: Hematology and Oncology

## 2020-07-15 DIAGNOSIS — Z170 Estrogen receptor positive status [ER+]: Secondary | ICD-10-CM

## 2020-07-15 DIAGNOSIS — C50919 Malignant neoplasm of unspecified site of unspecified female breast: Secondary | ICD-10-CM | POA: Insufficient documentation

## 2020-07-15 DIAGNOSIS — C50812 Malignant neoplasm of overlapping sites of left female breast: Secondary | ICD-10-CM | POA: Diagnosis not present

## 2020-07-15 IMAGING — CT CT CHEST-ABD-PELV W/ CM
2 of 4 series · 14 of 36 positions shown, 16 images · IV contrast (OMNIPAQUE)
Comparison: [DATE].

CLINICAL DATA: Breast cancer.  Restaging.

EXAM:
CT CHEST, ABDOMEN, AND PELVIS WITH CONTRAST
TECHNIQUE: Multidetector CT imaging of the chest, abdomen and pelvis was
performed following the standard protocol during bolus
administration of intravenous contrast.
CONTRAST:  100mL OMNIPAQUE IOHEXOL 300 MG/ML  SOLN

[Series 2: cap with · axial · 0.78mm/px · z∈[-580,-60]mm · 11 of 126 slices shown, 13 images]
[im 11/126  mediastinal]
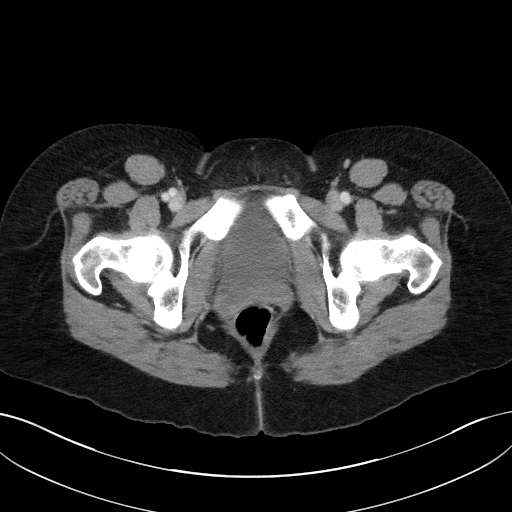
[im 11/126  bone]
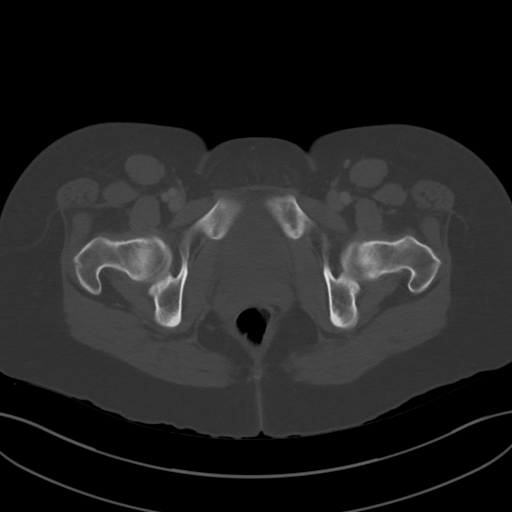
[im 21/126  mediastinal]
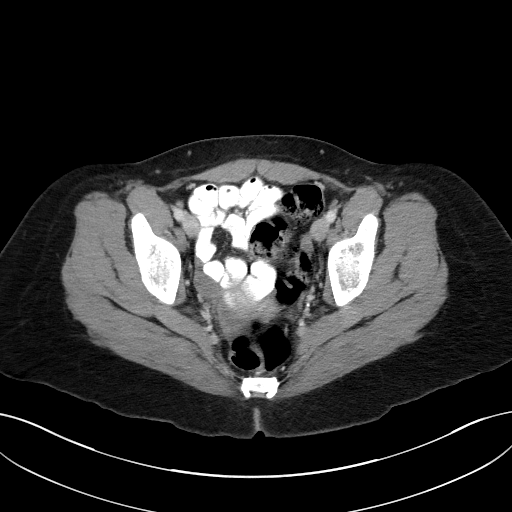
[im 32/126  mediastinal]
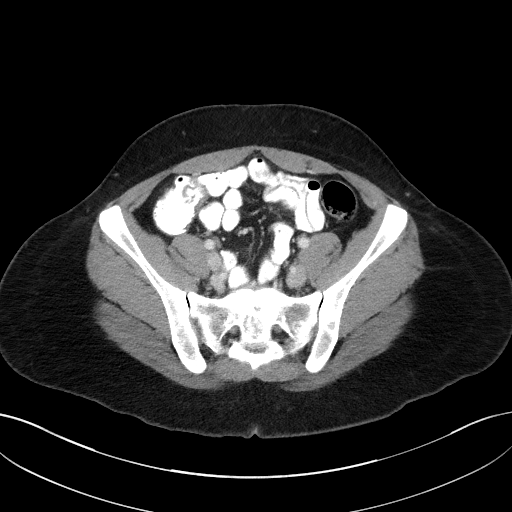
[im 42/126  mediastinal]
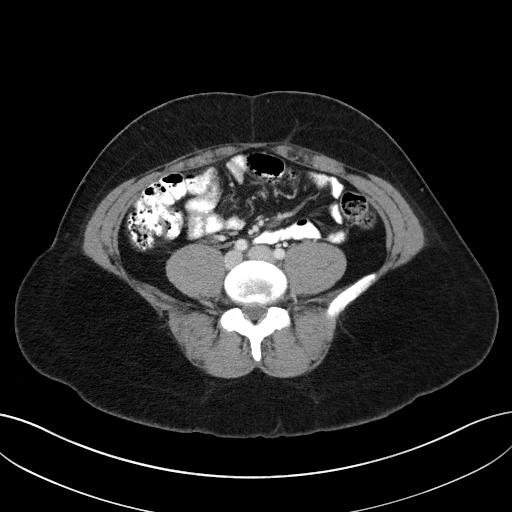
[im 53/126  mediastinal]
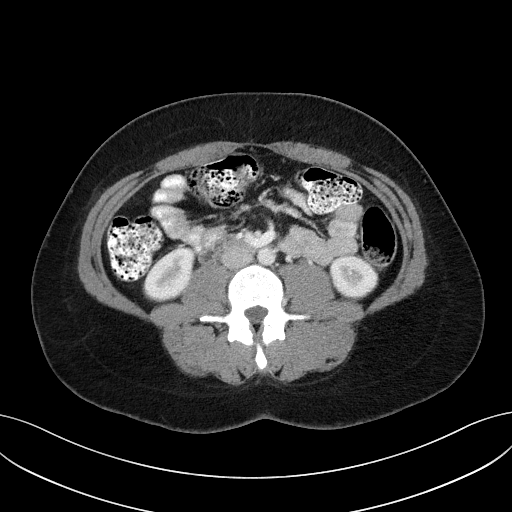
[im 63/126  mediastinal]
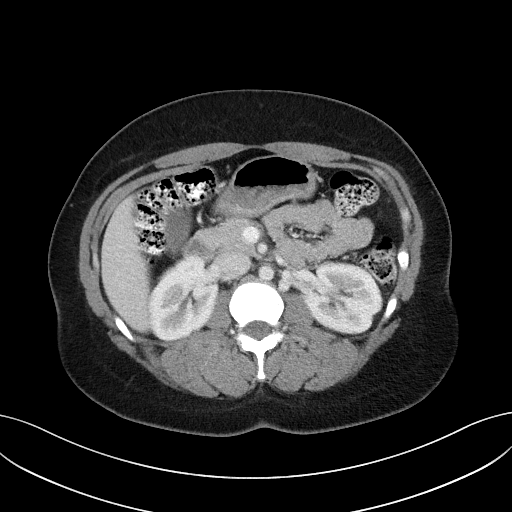
[im 73/126  mediastinal]
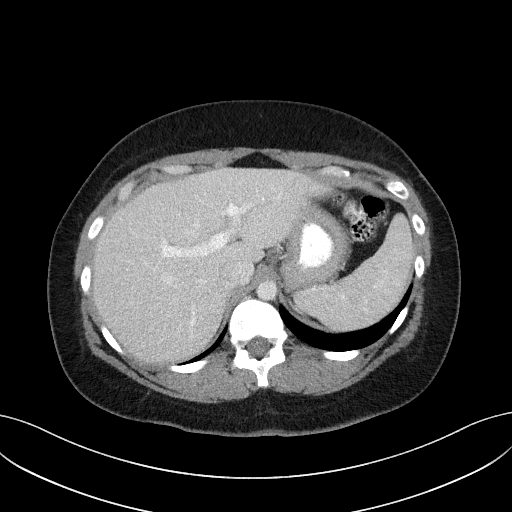
[im 84/126  mediastinal]
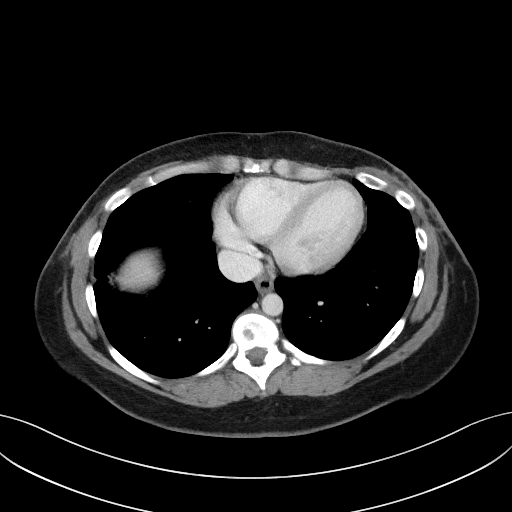
[im 94/126  mediastinal]
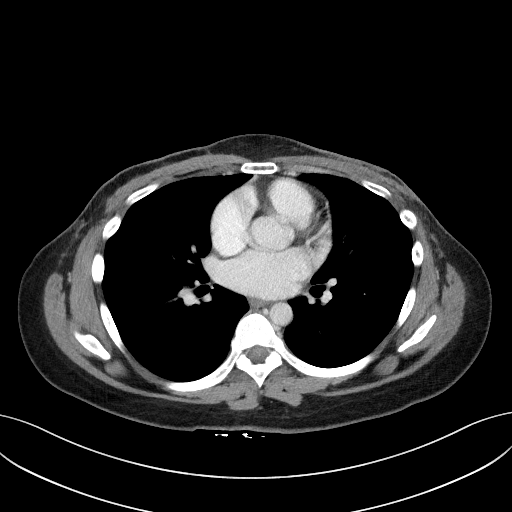
[im 94/126  bone]
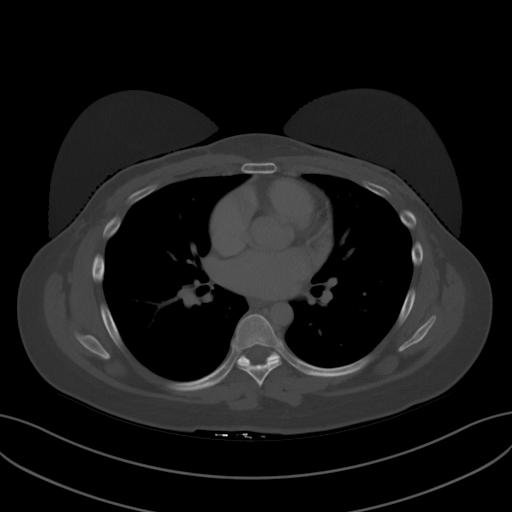
[im 105/126  mediastinal]
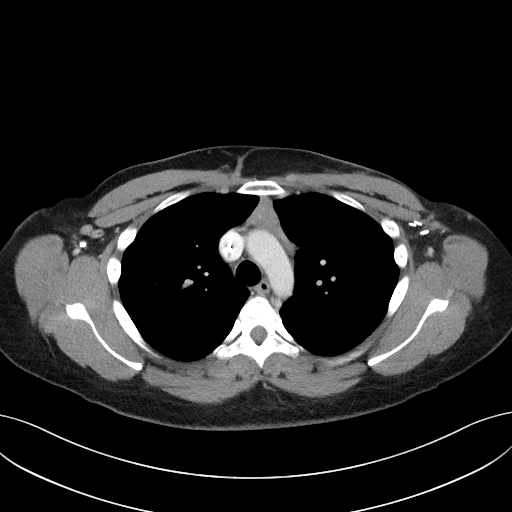
[im 115/126  mediastinal]
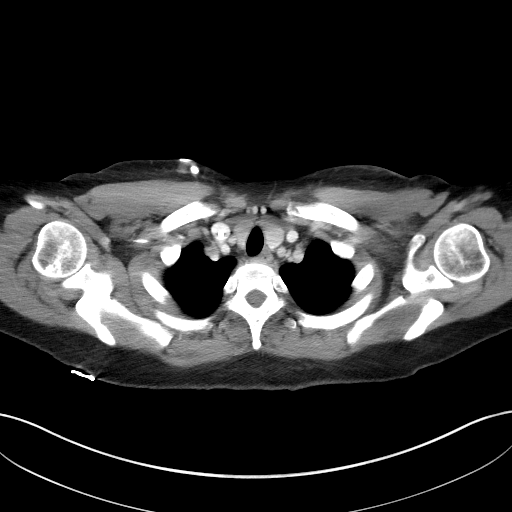

[Series 4: coronals · coronal · 0.82mm/px · 3 of 116 slices shown]
[im 24/116  mediastinal]
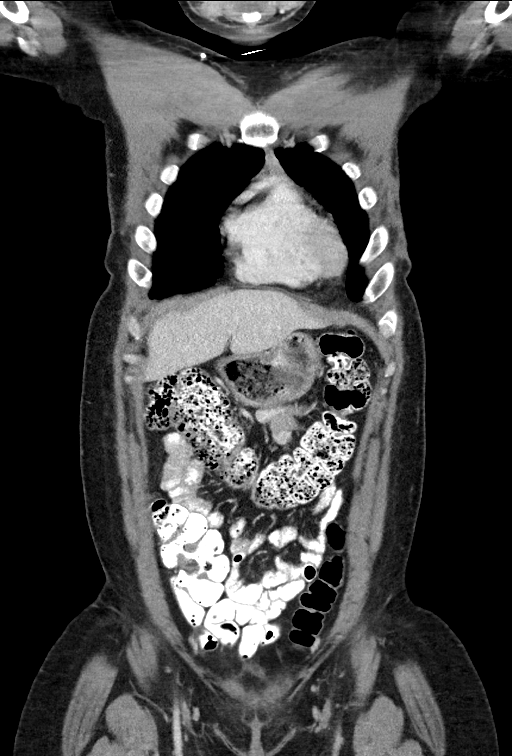
[im 47/116  mediastinal]
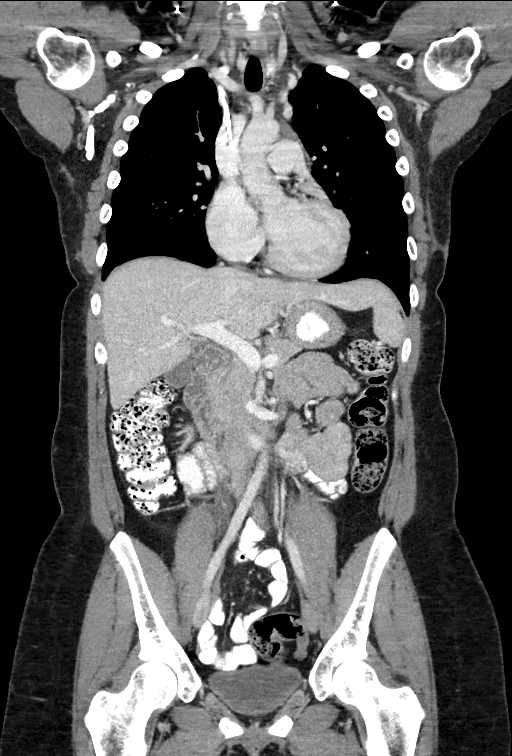
[im 70/116  mediastinal]
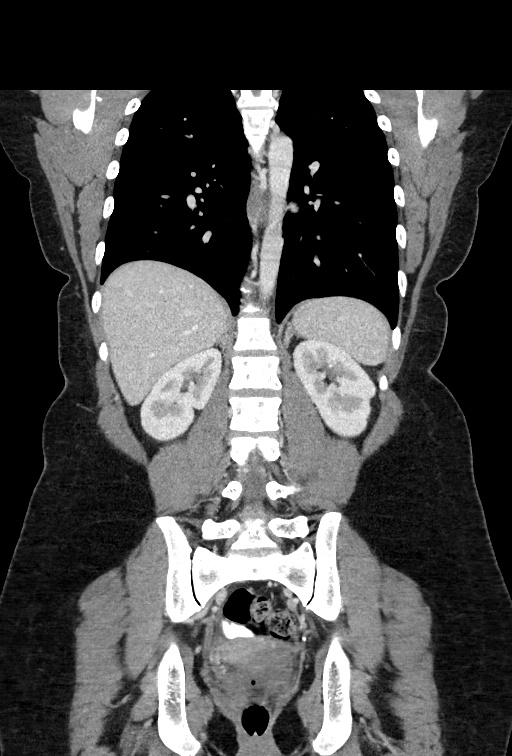

[14 of 36 positions shown; findings below may reference images not displayed]

FINDINGS: CT CHEST FINDINGS

Cardiovascular: The heart size is normal. No substantial pericardial
effusion. Right Port-A-Cath tip is positioned in the mid SVC.

Mediastinum/Nodes: Thymic remnant again noted anterior mediastinum.
No mediastinal lymphadenopathy. There is no hilar lymphadenopathy.
The esophagus has normal imaging features. Surgical clips are noted
in the axillary regions bilaterally. Small lymph nodes in the right
axilla have progressed in the interval. 7 mm right axillary node on
[DATE] was 4 mm short axis previously.

Lungs/Pleura: No suspicious pulmonary nodule or mass. No focal
airspace consolidation. No pleural effusion.

Musculoskeletal: Multiple thoracic spine lesions have become more
sclerotic in the interval suggesting interval healing. This includes
the large T6 lesion, index lesion seen previously at T9, and lucent
T12 pedicle lesion.

CT ABDOMEN PELVIS FINDINGS

Hepatobiliary: No suspicious focal abnormality within the liver
parenchyma. There is no evidence for gallstones, gallbladder wall
thickening, or pericholecystic fluid. No intrahepatic or
extrahepatic biliary dilation.

Pancreas: No focal mass lesion. No dilatation of the main duct. No
intraparenchymal cyst. No peripancreatic edema.

Spleen: No splenomegaly. No focal mass lesion.

Adrenals/Urinary Tract: No adrenal nodule or mass. Kidneys
unremarkable. No evidence for hydroureter. The urinary bladder
appears normal for the degree of distention.

Stomach/Bowel: Stomach is unremarkable. No gastric wall thickening.
No evidence of outlet obstruction. Duodenum is normally positioned
as is the ligament of Treitz. No small bowel wall thickening. No
small bowel dilatation. The terminal ileum is normal. The appendix
is normal. No gross colonic mass. No colonic wall thickening.
Moderate to large stool volume throughout.

Vascular/Lymphatic: No abdominal aortic aneurysm. No abdominal
aortic atherosclerotic calcification. There is no gastrohepatic or
hepatoduodenal ligament lymphadenopathy. No retroperitoneal or
mesenteric lymphadenopathy. No pelvic sidewall lymphadenopathy.

Reproductive: The uterus is unremarkable.  There is no adnexal mass.

Other: No intraperitoneal free fluid.

Musculoskeletal: As in the chest, previous existing bone metastases
have become more sclerotic in the interval suggesting healing. There
are new scattered small sclerotic foci presumably representing
healing of previously occult bone lesions.
IMPRESSION: 1. Previously existing bone lesion show interval sclerotic change
suggesting interval healing. New scattered small sclerotic foci
presumably represent healing of previously occult bone lesions.
2. Interval increase in size of small right axillary lymph nodes.
While these are unenlarged by CT size criteria, close attention on
follow-up recommended.
3. No definite evidence for soft tissue metastases in the chest,
abdomen, or pelvis.
4. Moderate to large stool volume. Imaging features could be
compatible with constipation in the appropriate clinical setting.

## 2020-07-15 IMAGING — NM NM BONE WHOLE BODY
2 series · 2 of 2 positions shown · non-contrast
Comparison: [DATE]

CLINICAL DATA: Breast cancer.  Restaging

EXAM:
NUCLEAR MEDICINE WHOLE BODY BONE SCAN
TECHNIQUE: Whole body anterior and posterior images were obtained approximately
3 hours after intravenous injection of radiopharmaceutical.
RADIOPHARMACEUTICALS:  20.6 mCi [MS] MDP IV

[Series 1: wbr_bone_40 whole body · 2.66mm/px · 1 of 1 slices shown (1 of 2)]
[im 1/1]
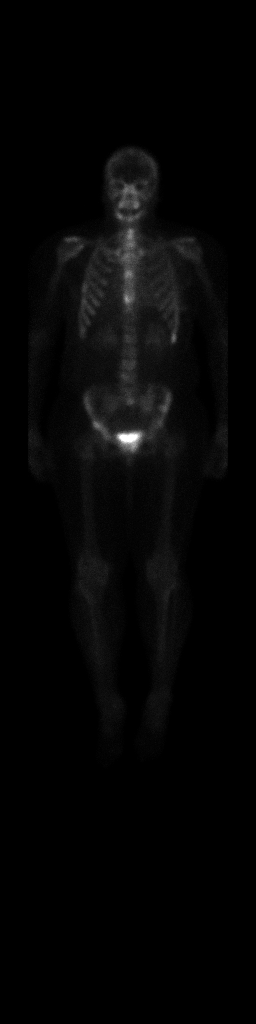

[Series 1: wbr_bone_40 whole body · 2.66mm/px · 1 of 1 slices shown (2 of 2)]
[im 1/1]
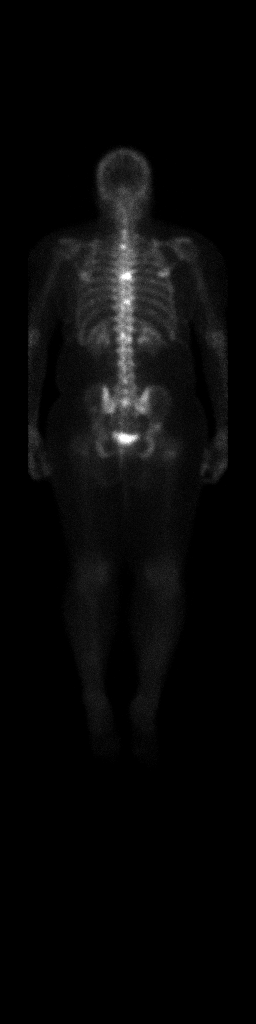

[2 of 2 positions shown; findings below may reference images not displayed]

FINDINGS: Multifocal abnormal areas of increased radiotracer uptake involving
the ribs, sternum, thoracic and lumbar spine, bony pelvis and
proximal right femur are again noted. The size and degree of
radiotracer uptake associated with these lesions has decreased in
the interval compatible with response to therapy. Normal physiologic
tracer activity noted within the kidneys an urinary bladder.
IMPRESSION: 1. Interval response to therapy. Previously noted abnormal areas of
increased uptake within the axial and appendicular skeleton appear
decreased in size and degree of tracer uptake. No signs of
progressive disease.

## 2020-07-15 MED ORDER — SODIUM CHLORIDE (PF) 0.9 % IJ SOLN
INTRAMUSCULAR | Status: AC
  Filled 2020-07-15: qty 50

## 2020-07-15 MED ORDER — IOHEXOL 300 MG/ML  SOLN
100.0000 mL | Freq: Once | INTRAMUSCULAR | Status: AC | PRN
  Administered 2020-07-15: 100 mL via INTRAVENOUS

## 2020-07-15 MED ORDER — TECHNETIUM TC 99M MEDRONATE IV KIT
20.6000 | PACK | Freq: Once | INTRAVENOUS | Status: AC
  Administered 2020-07-15: 20.6 via INTRAVENOUS

## 2020-07-17 NOTE — Progress Notes (Signed)
TELEPHONE VISIT:  Patient Care Team: William Dalton, NP as PCP - General (Nurse Practitioner) Pershing Proud, RN as Oncology Nurse Navigator Donnelly Angelica, RN as Oncology Nurse Navigator Emelia Loron, MD as Surgeon (General Surgery) Serena Croissant, MD as Medical Oncologist (Hematology and Oncology)  DIAGNOSIS:    ICD-10-CM   1. 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 ER 50%, PR 30%, Ki-67 50%, HER-2 negative ratio 1.03   03/04/2019 Cancer Staging   Staging form: Breast, AJCC 8th Edition - Clinical stage from 03/04/2019: Stage IIIB (cT4, cN1, cM0, G3, ER+, PR+, HER2-) - Signed by Serena Croissant, MD on 03/04/2019    Neo-Adjuvant Chemotherapy   Adriamycin and Cytoxan x 4 given every three weeks without Neulasta support., followed by weekly Taxol x 12 versus Taxotere Cytoxan after her delivery   04/15/2019 Genetic Testing   Negative genetic testing:  No pathogenic variants detected on the [employer] Breast Cancer Guidelines-Based Panel or the Common Hereditary Cancers Panel. The report date is 04/15/2019.  The Breast Cancer Guidelines-Based panel offered by [facility] includes sequencing and rearrangement analysis for the following 11 genes:  ATM, BRCA1, BRCA2, CDH1, CHEK2, NBN, NF1, PALB2, PTEN, STK11 and TP53.  The Common Hereditary Cancers Panel offered by [facility] includes sequencing and/or deletion  duplication testing of the following 48 genes: APC, ATM, AXIN2, BARD1, BMPR1A, BRCA1, BRCA2, BRIP1, CDH1, CDK4, CDKN2A (p14ARF), CDKN2A (p16INK4a), CHEK2, CTNNA1, DICER1, EPCAM (Deletion/duplication testing only), GREM1 (promoter region deletion/duplication testing only), KIT, MEN1, MLH1, MSH2, MSH3, MSH6, MUTYH, NBN, NF1, NHTL1, PALB2, PDGFRA, PMS2, POLD1, POLE, PTEN, RAD50, RAD51C, RAD51D, RNF43, SDHB, SDHC, SDHD, SMAD4, SMARCA4. STK11, TP53, TSC1, TSC2, and VHL.  The following genes were evaluated for sequence changes only: SDHA and HOXB13 c.251G>A variant only.    10/08/2019 Surgery   Bilateral mastectomies Dwain Sarna):  Right mastectomy: Multifocal grade 2 IDC 0.8 cm and 0.6 cm with high-grade DCIS, margins negative, 0/4 lymph nodes ER 95%, PR 5%, Ki-67 10%, HER-2 negative;  Left mastectomy: IDC 0.6 cm, LVI, margins negative, 1/4 lymph nodes positive, ER 60%, PR 60%, HER-2 negative, Ki-67 50% RCB class II   11/30/2019 - 01/08/2020 Radiation Therapy   Adjuvant radiation    Anti-estrogen oral therapy   Ibrance Letrozole, Zolodex, Xgeva     CHIEF COMPLIANT: Follow-up of metastatic breast cancer  INTERVAL HISTORY: Melissa [name] is a 30 y.o. with above-mentioned history of metastaticbreast cancerwith osseous metastases who is currently on treatment withFaslodex,letrozole,Ibrance plus Zoladex along with Xgeva.CT CAP on 07/15/20 showed treated osseous metastases and increase in the sie of the small right axillary lymph nodes. She presents to the clinic todayfor follow-up to review her scans.    ALLERGIES:  has No Known Allergies.  MEDICATIONS:  Current Outpatient Medications  Medication Sig Dispense Refill  . anastrozole (ARIMIDEX) 1 MG tablet Take 1 tablet (1 mg total) by mouth daily. 90 tablet 3  . carvedilol (COREG) 6.25 MG tablet Take 1 tablet (6.25 mg total) by  mouth 2 (two) times daily. 60 tablet 11  . FARXIGA 10 MG TABS tablet TAKE 1 TABLET BY MOUTH DAILY BEFORE BREAKFAST. 30  tablet 11  . furosemide (LASIX) 20 MG tablet Take 1 tablet (20 mg total) by mouth daily as needed for fluid or edema. 90 tablet 2  . gabapentin (NEURONTIN) 100 MG capsule Take 1 capsule (100 mg total) by mouth at bedtime. 30 capsule 0  . palbociclib (IBRANCE) 100 MG tablet TAKE 1 TABLET BY MOUTH DAILY. TAKE FOR 21 DAYS ON, 7 DAYS OFF, REPEAT EVERY 28 DAYS. 21 tablet 6  . sacubitril-valsartan (ENTRESTO) 97-103 MG Take 1 tablet by mouth 2 (two) times daily. 60 tablet 11  . traMADol (ULTRAM) 50 MG tablet Take 50 mg by mouth every 6 (six) hours as needed.     No current facility-administered medications for this visit.    PHYSICAL EXAMINATION: ECOG PERFORMANCE STATUS: 1 - Symptomatic but completely ambulatory  There were no vitals filed for this visit. There were no vitals filed for this visit.   LABORATORY DATA:  I have reviewed the data as listed CMP Latest Ref Rng & Units 07/05/2020 06/08/2020 05/13/2020  Glucose 70 - 99 mg/dL 96 91 97  BUN 6 - 20 mg/dL $Remove'15 14 11  'mnTTwjH$ Creatinine 0.44 - 1.00 mg/dL 0.83 0.77 0.68  Sodium 135 - 145 mmol/L 141 142 139  Potassium 3.5 - 5.1 mmol/L 3.9 4.2 4.0  Chloride 98 - 111 mmol/L 107 105 108  CO2 22 - 32 mmol/L $RemoveB'25 27 24  'fymmaUVG$ Calcium 8.9 - 10.3 mg/dL 8.6(L) 9.0 8.3(L)  Total Protein 6.5 - 8.1 g/dL 7.3 8.3(H) 7.2  Total Bilirubin 0.3 - 1.2 mg/dL 0.5 0.4 0.2(L)  Alkaline Phos 38 - 126 U/L 48 69 79  AST 15 - 41 U/L 17 14(L) 15  ALT 0 - 44 U/L $Remo'14 14 14    'AytCq$ Lab Results  Component Value Date   WBC 2.0 (L) 07/05/2020   HGB 12.2 07/05/2020   HCT 36.4 07/05/2020   MCV 94.8 07/05/2020   PLT 161 07/05/2020   NEUTROABS 0.8 (L) 07/05/2020    ASSESSMENT & PLAN:  Malignant neoplasm of overlapping sites of left breast in female, estrogen receptor positive ([place]) 02/26/2019:Pregnant lady with 24-month history of left breast swelling and skin thickening, mammogram showed pleomorphic calcifications 8 cm, axilla lymph node biopsy positive ER 60%, PR 60%, Ki-67 50%, HER-2  negative ratio 1.25, anterior and posterior end of the calcifications biopsied: 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 Neoadjuvant chemotherapy with dose dense Adriamycin and Cytoxan given every 3 weeks without Neulasta support given her pregnancycompleted 05/28/2019 2. Post-Partumweekly Taxol x12starting 07/03/2019 3. Bilateral mastectomy 10/08/2019: Right mastectomy: Multifocal grade 2 IDC 0.8 cm and 0.6 cm with high-grade DCIS, margins negative, 0/4lymph nodes ER 95%, PR 5%, Ki-67 10%, HER-2 negative;left mastectomy: IDC 0.6 cm, LVI, margins negative, 1/4 lymph nodes positive, ER 60%, PR 60%, HER-2 negative, Ki-67 50% RCB class II 4. Adjuvant radiation therapy 5. Followed by adjuvant antiestrogen therapy with complete estrogen blockade (ovarian suppression with AI) CT C/A/P on 5/13 shows sclerotic bone lesions, bone scan negative 6.DeclinedCDK 4 and 6 inhibitor abemaciclibwith antiestrogen therapy ---------------------------------------------------------------------------------------------------------------------------------------------------- Bone scan 04/01/2020: Interval development of multifocal abnormal areas of increased tracer uptake compatible with diffuse bone metastases bony pelvis, right acetabulum, left pubic  bone, anterior left lower rib, thoracic and upper lumbar spine including T6, T9, L1, intertrochanteric region of right femur  03/31/2020: CT CAP: Bone metastases. No visceral mets.  Treatment plan: Faslodex+letrozole +Ibrance plus Zoladex along with Xgeva  IbranceToxicities: Neutropenia:Stable white blood cell count on 100 mg of Ibrance. She is tolerating it very well.  07/15/20: CT CAP: Previous bone lesions show sclerotic changes suggesting healing.   Interval increase in size of small right axillary lymph nodes.  No evidence of any distant soft tissue mets 07/15/2020: Bone scan: Response in the bones is noted.  Teeth extraction on May 12th: hold off on giving Xgeva on 08/05/2020. I will see her back in person on 09/05/2020 with labs and injection appointments including Xgeva at that time if she is fully healed from the teeth extraction.    No orders of the defined types were placed in this encounter.  The patient has a good understanding of the overall plan. she agrees with it. she will call with any problems that may develop before the next visit here.  Total time spent: 30 mins including face to face time and time spent for planning, charting and coordination of care  Rulon Eisenmenger, MD, MPH 07/18/2020  I, Cloyde Reams Dorshimer, am acting as scribe for Dr. Nicholas Lose.  I have reviewed the above documentation for accuracy and completeness, and I agree with the above.

## 2020-07-18 ENCOUNTER — Inpatient Hospital Stay: Payer: Medicaid Other | Attending: Hematology and Oncology | Admitting: Hematology and Oncology

## 2020-07-18 DIAGNOSIS — Z170 Estrogen receptor positive status [ER+]: Secondary | ICD-10-CM

## 2020-07-18 DIAGNOSIS — C50812 Malignant neoplasm of overlapping sites of left female breast: Secondary | ICD-10-CM | POA: Diagnosis not present

## 2020-07-18 DIAGNOSIS — Z79811 Long term (current) use of aromatase inhibitors: Secondary | ICD-10-CM | POA: Insufficient documentation

## 2020-07-18 DIAGNOSIS — Z5111 Encounter for antineoplastic chemotherapy: Secondary | ICD-10-CM | POA: Insufficient documentation

## 2020-07-18 DIAGNOSIS — Z923 Personal history of irradiation: Secondary | ICD-10-CM | POA: Insufficient documentation

## 2020-07-18 DIAGNOSIS — C7951 Secondary malignant neoplasm of bone: Secondary | ICD-10-CM | POA: Insufficient documentation

## 2020-07-18 NOTE — Assessment & Plan Note (Signed)
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 Neoadjuvant chemotherapy with dose dense Adriamycin and Cytoxan given every 3 weeks without Neulasta support given her pregnancycompleted 05/28/2019 2. Post-Partumweekly Taxol x12starting 07/03/2019 3. Bilateral mastectomy 10/08/2019: Right mastectomy: Multifocal grade 2 IDC 0.8 cm and 0.6 cm with high-grade DCIS, margins negative, 0/4lymph nodes ER 95%, PR 5%, Ki-67 10%, HER-2 negative;left mastectomy: IDC 0.6 cm, LVI, margins negative, 1/4 lymph nodes positive, ER 60%, PR 60%, HER-2 negative, Ki-67 50% RCB class II 4. Adjuvant radiation therapy 5. Followed by adjuvant antiestrogen therapy with complete estrogen blockade (ovarian suppression with AI) CT C/A/P on 5/13 shows sclerotic bone lesions, bone scan negative 6.DeclinedCDK 4 and 6 inhibitor abemaciclibwith antiestrogen therapy ---------------------------------------------------------------------------------------------------------------------------------------------------- Bone scan 04/01/2020: Interval development of multifocal abnormal areas of increased tracer uptake compatible with diffuse bone metastases bony pelvis, right acetabulum, left pubic bone, anterior left lower rib, thoracic and upper lumbar spine including T6, T9, L1, intertrochanteric region of right femur  03/31/2020: CT CAP: Bone metastases. No visceral mets.  Treatment plan: Faslodex+letrozole +Ibrance plus Zoladex along with  Xgeva  IbranceToxicities: Neutropenia:Stable white blood cell count on 100 mg of Ibrance. She is tolerating it very well.  07/15/20: CT CAP: Previous bone lesions show sclerotic changes suggesting healing.  Interval increase in size of small right axillary lymph nodes.  No evidence of any distant soft tissue mets 07/15/2020: Bone scan  Teeth extraction: hold off on giving Xgeva on 08/05/2020. I will see her back in person on 09/05/2020 with labs and injection appointments including Xgeva at that time if she is fully healed from the teeth extraction.

## 2020-07-21 ENCOUNTER — Other Ambulatory Visit: Payer: Self-pay | Admitting: *Deleted

## 2020-07-21 MED ORDER — ANASTROZOLE 1 MG PO TABS: 1.0000 mg | ORAL_TABLET | Freq: Every day | ORAL | 3 refills | Status: DC

## 2020-07-26 ENCOUNTER — Other Ambulatory Visit (HOSPITAL_COMMUNITY): Payer: Self-pay

## 2020-07-29 ENCOUNTER — Other Ambulatory Visit: Payer: Self-pay

## 2020-07-29 DIAGNOSIS — C50919 Malignant neoplasm of unspecified site of unspecified female breast: Secondary | ICD-10-CM

## 2020-07-29 DIAGNOSIS — C50812 Malignant neoplasm of overlapping sites of left female breast: Secondary | ICD-10-CM

## 2020-07-29 DIAGNOSIS — Z170 Estrogen receptor positive status [ER+]: Secondary | ICD-10-CM

## 2020-07-29 NOTE — Progress Notes (Signed)
Referral placed and successfully faxed to Dr. Nicki Reaper [place]'s office. [phone]

## 2020-08-02 ENCOUNTER — Other Ambulatory Visit (HOSPITAL_COMMUNITY): Payer: Self-pay

## 2020-08-02 MED FILL — Palbociclib Tab 100 MG: ORAL | 28 days supply | Qty: 21 | Fill #1 | Status: AC

## 2020-08-05 ENCOUNTER — Other Ambulatory Visit: Payer: Self-pay

## 2020-08-05 ENCOUNTER — Inpatient Hospital Stay: Payer: Medicaid Other

## 2020-08-05 VITALS — BP 110/70 | HR 63 | Temp 98.6°F | Resp 18

## 2020-08-05 DIAGNOSIS — C50812 Malignant neoplasm of overlapping sites of left female breast: Secondary | ICD-10-CM | POA: Diagnosis present

## 2020-08-05 DIAGNOSIS — Z170 Estrogen receptor positive status [ER+]: Secondary | ICD-10-CM

## 2020-08-05 DIAGNOSIS — C7951 Secondary malignant neoplasm of bone: Secondary | ICD-10-CM | POA: Diagnosis present

## 2020-08-05 DIAGNOSIS — Z79811 Long term (current) use of aromatase inhibitors: Secondary | ICD-10-CM | POA: Diagnosis not present

## 2020-08-05 DIAGNOSIS — Z5111 Encounter for antineoplastic chemotherapy: Secondary | ICD-10-CM | POA: Diagnosis not present

## 2020-08-05 DIAGNOSIS — Z95828 Presence of other vascular implants and grafts: Secondary | ICD-10-CM

## 2020-08-05 DIAGNOSIS — Z923 Personal history of irradiation: Secondary | ICD-10-CM | POA: Diagnosis not present

## 2020-08-05 MED ORDER — GOSERELIN ACETATE 3.6 MG ~~LOC~~ IMPL
3.6000 mg | DRUG_IMPLANT | Freq: Once | SUBCUTANEOUS | Status: AC
  Administered 2020-08-05: 3.6 mg via SUBCUTANEOUS

## 2020-08-05 MED ORDER — GOSERELIN ACETATE 3.6 MG ~~LOC~~ IMPL
DRUG_IMPLANT | SUBCUTANEOUS | Status: AC
  Filled 2020-08-05: qty 3.6

## 2020-08-05 MED ORDER — DENOSUMAB 120 MG/1.7ML ~~LOC~~ SOLN
120.0000 mg | Freq: Once | SUBCUTANEOUS | Status: AC
  Administered 2020-08-05: 120 mg via SUBCUTANEOUS

## 2020-08-05 MED ORDER — DENOSUMAB 120 MG/1.7ML ~~LOC~~ SOLN
SUBCUTANEOUS | Status: AC
  Filled 2020-08-05: qty 1.7

## 2020-08-05 NOTE — Progress Notes (Signed)
Okay to proceed with Xgeva with Calcium of 8.6 from 4/26 per Dr. Lindi Adie

## 2020-08-05 NOTE — Patient Instructions (Signed)
Denosumab injection What is this medicine? DENOSUMAB (den oh sue mab) slows bone breakdown. Prolia is used to treat osteoporosis in women after menopause and in men, and in people who are taking corticosteroids for 6 months or more. Xgeva is used to treat a high calcium level due to cancer and to prevent bone fractures and other bone problems caused by multiple myeloma or cancer bone metastases. Xgeva is also used to treat giant cell tumor of the bone. This medicine may be used for other purposes; ask your health care provider or pharmacist if you have questions. COMMON BRAND NAME(S): Prolia, XGEVA What should I tell my health care provider before I take this medicine? They need to know if you have any of these conditions:  dental disease  having surgery or tooth extraction  infection  kidney disease  low levels of calcium or Vitamin D in the blood  malnutrition  on hemodialysis  skin conditions or sensitivity  thyroid or parathyroid disease  an unusual reaction to denosumab, other medicines, foods, dyes, or preservatives  pregnant or trying to get pregnant  breast-feeding How should I use this medicine? This medicine is for injection under the skin. It is given by a health care professional in a hospital or clinic setting. A special MedGuide will be given to you before each treatment. Be sure to read this information carefully each time. For Prolia, talk to your pediatrician regarding the use of this medicine in children. Special care may be needed. For Xgeva, talk to your pediatrician regarding the use of this medicine in children. While this drug may be prescribed for children as young as 13 years for selected conditions, precautions do apply. Overdosage: If you think you have taken too much of this medicine contact a poison control center or emergency room at once. NOTE: This medicine is only for you. Do not share this medicine with others. What if I miss a dose? It is  important not to miss your dose. Call your doctor or health care professional if you are unable to keep an appointment. What may interact with this medicine? Do not take this medicine with any of the following medications:  other medicines containing denosumab This medicine may also interact with the following medications:  medicines that lower your chance of fighting infection  steroid medicines like prednisone or cortisone This list may not describe all possible interactions. Give your health care provider a list of all the medicines, herbs, non-prescription drugs, or dietary supplements you use. Also tell them if you smoke, drink alcohol, or use illegal drugs. Some items may interact with your medicine. What should I watch for while using this medicine? Visit your doctor or health care professional for regular checks on your progress. Your doctor or health care professional may order blood tests and other tests to see how you are doing. Call your doctor or health care professional for advice if you get a fever, chills or sore throat, or other symptoms of a cold or flu. Do not treat yourself. This drug may decrease your body's ability to fight infection. Try to avoid being around people who are sick. You should make sure you get enough calcium and vitamin D while you are taking this medicine, unless your doctor tells you not to. Discuss the foods you eat and the vitamins you take with your health care professional. See your dentist regularly. Brush and floss your teeth as directed. Before you have any dental work done, tell your dentist you are   receiving this medicine. Do not become pregnant while taking this medicine or for 5 months after stopping it. Talk with your doctor or health care professional about your birth control options while taking this medicine. Women should inform their doctor if they wish to become pregnant or think they might be pregnant. There is a potential for serious side  effects to an unborn child. Talk to your health care professional or pharmacist for more information. What side effects may I notice from receiving this medicine? Side effects that you should report to your doctor or health care professional as soon as possible:  allergic reactions like skin rash, itching or hives, swelling of the face, lips, or tongue  bone pain  breathing problems  dizziness  jaw pain, especially after dental work  redness, blistering, peeling of the skin  signs and symptoms of infection like fever or chills; cough; sore throat; pain or trouble passing urine  signs of low calcium like fast heartbeat, muscle cramps or muscle pain; pain, tingling, numbness in the hands or feet; seizures  unusual bleeding or bruising  unusually weak or tired Side effects that usually do not require medical attention (report to your doctor or health care professional if they continue or are bothersome):  constipation  diarrhea  headache  joint pain  loss of appetite  muscle pain  runny nose  tiredness  upset stomach This list may not describe all possible side effects. Call your doctor for medical advice about side effects. You may report side effects to FDA at 1-800-FDA-1088. Where should I keep my medicine? This medicine is only given in a clinic, doctor's office, or other health care setting and will not be stored at home. NOTE: This sheet is a summary. It may not cover all possible information. If you have questions about this medicine, talk to your doctor, pharmacist, or health care provider.  2021 [employer] Standard (2017-07-05 16:10:44) Calcium Carbonate chewable tablets What is this medicine? CALCIUM CARBONATE (KAL see um KAR bon ate) is a calcium salt. It is used as an antacid to relieve the symptoms of indigestion and heartburn. It is also used to prevent osteoporosis, as a calcium supplement, and to treat high phosphate levels in patients with kidney  disease. This medicine may be used for other purposes; ask your health care provider or pharmacist if you have questions. COMMON BRAND NAME(S): Alka-Mints, Alka-Seltzer, Alka-Seltzer Heartburn Relief, Alkets, Antacid Fast Dissolve, Cal-Gest, Calcium Antacid, Maalox, Maalox Antacid Barrier, Maalox Quick Dissolve, Mylanta, Pepto-Bismol, Rolaids Extra Strength, Titralac, Tums, Tums Chewy Bites, Tums Cool Relief, Tums E-X, Tums Freshers, Tums Kids, Tums Lasting Effects, Tums Smooth Dissolve, Tums Smoothies, Tums Ultra What should I tell my health care provider before I take this medicine? They need to know if you have any of these conditions: constipation dehydration high blood calcium levels kidney disease stomach bleeding, obstruction, or ulcer an unusual or allergic reaction to calcium carbonate, other medicines, foods, dyes, or preservatives pregnant or trying to get pregnant breast-feeding How should I use this medicine? Take this medicine by mouth. Chew it completely before swallowing. Follow the directions on the label. Drink a glass of water after taking this medicine. Antacids are usually taken after meals and at bedtime, or as directed by your doctor or health care professional. Take your medicine at regular intervals. Do not take your medicine more often than directed. Talk to your pediatrician regarding the use of this medicine in children. While this medicine may be used in children for selected  conditions, precautions do apply. Overdosage: If you think you have taken too much of this medicine contact a poison control center or emergency room at once. NOTE: This medicine is only for you. Do not share this medicine with others. What if I miss a dose? If you miss a dose, take it as soon as you can. If it is almost time for your next dose, take only that dose. Do not take double or extra doses. What may interact with this medicine? Do not take this medicine with any of the following  medications: ammonium chloride methenamine This medicine may also interact with the following medications: antibiotics like ciprofloxacin, tetracycline captopril delavirdine gabapentin iron supplements medicines for fungal infections like ketoconazole and itraconazole medicines for seizures like ethotoin and phenytoin mycophenolate quinidine rosuvastatin sucralfate thyroid medicine This list may not describe all possible interactions. Give your health care provider a list of all the medicines, herbs, non-prescription drugs, or dietary supplements you use. Also tell them if you smoke, drink alcohol, or use illegal drugs. Some items may interact with your medicine. What should I watch for while using this medicine? Tell your doctor or healthcare professional if your symptoms do not start to get better or if they get worse. Do not treat yourself for stomach problems with this medicine for more than 2 weeks. See a doctor if you have black tarry stools, rectal bleeding, or if you feel unusually tired. Do not change to another antacid product without advice. If you are taking other medicines, leave an interval of at least 2 hours before or after taking this medicine. To help reduce constipation, drink several glasses of water a day. What side effects may I notice from receiving this medicine? Side effects that you should report to your doctor or health care professional as soon as possible: allergic reactions like skin rash, itching or hives, swelling of the face, lips, or tongue confusion or irritability headache loss of appetite nausea, vomiting unusually weak or tired Side effects that usually do not require medical attention (report to your doctor or health care professional if they continue or are bothersome): constipation stomach gas This list may not describe all possible side effects. Call your doctor for medical advice about side effects. You may report side effects to FDA at  1-800-FDA-1088. Where should I keep my medicine? Keep out of the reach of children. Store at room temperature between 15 and 30 degrees C (59 and 86 degrees F). Throw away any unused medicine after the expiration date. NOTE: This sheet is a summary. It may not cover all possible information. If you have questions about this medicine, talk to your doctor, pharmacist, or health care provider.  2021 [employer]/Gold Standard (2007-06-16 15:35:28) Goserelin injection What is this medicine? GOSERELIN (GOE se rel in) is similar to a hormone found in the body. It lowers the amount of sex hormones that the body makes. Men will have lower testosterone levels and women will have lower estrogen levels while taking this medicine. In men, this medicine is used to treat prostate cancer; the injection is either given once per month or once every 12 weeks. A once per month injection (only) is used to treat women with endometriosis, dysfunctional uterine bleeding, or advanced breast cancer. This medicine may be used for other purposes; ask your health care provider or pharmacist if you have questions. COMMON BRAND NAME(S): Zoladex What should I tell my health care provider before I take this medicine? They need to know if you have  any of these conditions:  bone problems  diabetes  heart disease  history of irregular heartbeat  an unusual or allergic reaction to goserelin, other medicines, foods, dyes, or preservatives  pregnant or trying to get pregnant  breast-feeding How should I use this medicine? This medicine is for injection under the skin. It is given by a health care professional in a hospital or clinic setting. Talk to your pediatrician regarding the use of this medicine in children. Special care may be needed. Overdosage: If you think you have taken too much of this medicine contact a poison control center or emergency room at once. NOTE: This medicine is only for you. Do not share this  medicine with others. What if I miss a dose? It is important not to miss your dose. Call your doctor or health care professional if you are unable to keep an appointment. What may interact with this medicine? Do not take this medicine with any of the following medications:  cisapride  dronedarone  pimozide  thioridazine This medicine may also interact with the following medications:  other medicines that prolong the QT interval (an abnormal heart rhythm) This list may not describe all possible interactions. Give your health care provider a list of all the medicines, herbs, non-prescription drugs, or dietary supplements you use. Also tell them if you smoke, drink alcohol, or use illegal drugs. Some items may interact with your medicine. What should I watch for while using this medicine? Visit your doctor or health care provider for regular checks on your progress. Your symptoms may appear to get worse during the first weeks of this therapy. Tell your doctor or healthcare provider if your symptoms do not start to get better or if they get worse after this time. Your bones may get weaker if you take this medicine for a long time. If you smoke or frequently drink alcohol you may increase your risk of bone loss. A family history of osteoporosis, chronic use of drugs for seizures (convulsions), or corticosteroids can also increase your risk of bone loss. Talk to your doctor about how to keep your bones strong. This medicine should stop regular monthly menstruation in women. Tell your doctor if you continue to menstruate. Women should not become pregnant while taking this medicine or for 12 weeks after stopping this medicine. Women should inform their doctor if they wish to become pregnant or think they might be pregnant. There is a potential for serious side effects to an unborn child. Talk to your health care professional or pharmacist for more information. Do not breast-feed an infant while taking  this medicine. Men should inform their doctors if they wish to father a child. This medicine may lower sperm counts. Talk to your health care professional or pharmacist for more information. This medicine may increase blood sugar. Ask your healthcare provider if changes in diet or medicines are needed if you have diabetes. What side effects may I notice from receiving this medicine? Side effects that you should report to your doctor or health care professional as soon as possible:  allergic reactions like skin rash, itching or hives, swelling of the face, lips, or tongue  bone pain  breathing problems  changes in vision  chest pain  feeling faint or lightheaded, falls  fever, chills  pain, swelling, warmth in the leg  pain, tingling, numbness in the hands or feet  signs and symptoms of high blood sugar such as being more thirsty or hungry or having to urinate more  than normal. You may also feel very tired or have blurry vision  signs and symptoms of low blood pressure like dizziness; feeling faint or lightheaded, falls; unusually weak or tired  stomach pain  swelling of the ankles, feet, hands  trouble passing urine or change in the amount of urine  unusually high or low blood pressure  unusually weak or tired Side effects that usually do not require medical attention (report to your doctor or health care professional if they continue or are bothersome):  change in sex drive or performance  changes in breast size in both males and females  changes in emotions or moods  headache  hot flashes  irritation at site where injected  loss of appetite  skin problems like acne, dry skin  vaginal dryness This list may not describe all possible side effects. Call your doctor for medical advice about side effects. You may report side effects to FDA at 1-800-FDA-1088. Where should I keep my medicine? This drug is given in a hospital or clinic and will not be stored at  home. NOTE: This sheet is a summary. It may not cover all possible information. If you have questions about this medicine, talk to your doctor, pharmacist, or health care provider.  2021 [employer] Standard (2018-06-16 14:05:56)

## 2020-08-25 ENCOUNTER — Other Ambulatory Visit (HOSPITAL_COMMUNITY): Payer: Self-pay

## 2020-08-31 ENCOUNTER — Other Ambulatory Visit (HOSPITAL_COMMUNITY): Payer: Self-pay

## 2020-08-31 MED FILL — Palbociclib Tab 100 MG: ORAL | 28 days supply | Qty: 21 | Fill #2 | Status: AC

## 2020-09-04 NOTE — Progress Notes (Signed)
Patient Care Team: [name], NP as PCP - General (Nurse Practitioner) [name], RN as Oncology Nurse Navigator Rockwell Germany, RN as Oncology Nurse Navigator Rolm Bookbinder, MD as Surgeon (General Surgery) Nicholas Lose, MD as Medical Oncologist (Hematology and Oncology)  DIAGNOSIS:    ICD-10-CM   1. Malignant neoplasm of overlapping sites of left breast in female, estrogen receptor positive ([place])  C50.812    Z17.0       SUMMARY OF ONCOLOGIC HISTORY: Oncology History  Malignant neoplasm of overlapping sites of left breast in female, estrogen receptor positive ([place])  02/26/2019 Initial Diagnosis   Pregnant lady with 53-monthhistory of left breast swelling and skin thickening, mammogram showed pleomorphic calcifications 8 cm, axilla lymph node biopsy positive ER 60%, PR 60%, Ki-67 50%, Melissa [name]-2 negative ratio 1.25, anterior and posterior end of the calcifications biopsied: 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 ER 50%, PR 30%, Ki-67 50%, Melissa [name]-2 negative ratio 1.03   03/04/2019 Cancer Staging   Staging form: Breast, AJCC 8th Edition - Clinical stage from 03/04/2019: Stage IIIB (cT4, cN1, cM0, G3, ER+, PR+, HER2-) - Signed by GNicholas Lose MD on 03/04/2019     Neo-Adjuvant Chemotherapy   Adriamycin and Cytoxan x 4 given every three weeks without Neulasta support., followed by weekly Taxol x 12 versus Taxotere Cytoxan after Melissa [name] delivery   04/15/2019 Genetic Testing   Negative genetic testing:  No pathogenic variants detected on the [employer] Breast Cancer Guidelines-Based Panel or the Common Hereditary Cancers Panel. The report date is 04/15/2019.  The Breast Cancer Guidelines-Based panel offered by [facility] includes sequencing and rearrangement analysis for the following 11 genes:  ATM, BRCA1, BRCA2, CDH1, CHEK2, NBN, NF1, PALB2, PTEN, STK11 and TP53.  The Common Hereditary Cancers Panel offered by [facility] includes sequencing and/or deletion duplication testing of  the following 48 genes: APC, ATM, AXIN2, BARD1, BMPR1A, BRCA1, BRCA2, BRIP1, CDH1, CDK4, CDKN2A (p14ARF), CDKN2A (p16INK4a), CHEK2, CTNNA1, DICER1, EPCAM (Deletion/duplication testing only), GREM1 (promoter region deletion/duplication testing only), KIT, MEN1, MLH1, MSH2, MSH3, MSH6, MUTYH, NBN, NF1, NHTL1, PALB2, PDGFRA, PMS2, POLD1, POLE, PTEN, RAD50, RAD51C, RAD51D, RNF43, SDHB, SDHC, SDHD, SMAD4, SMARCA4. STK11, TP53, TSC1, TSC2, and VHL.  The following genes were evaluated for sequence changes only: SDHA and HOXB13 c.251G>A variant only.    10/08/2019 Surgery   Bilateral mastectomies ([name]:  Right mastectomy: Multifocal grade 2 IDC 0.8 cm and 0.6 cm with high-grade DCIS, margins negative, 0/4 lymph nodes ER 95%, PR 5%, Ki-67 10%, Melissa [name]-2 negative;  Left mastectomy: IDC 0.6 cm, LVI, margins negative, 1/4 lymph nodes positive, ER 60%, PR 60%, Melissa [name]-2 negative, Ki-67 50% RCB class II   11/30/2019 - 01/08/2020 Radiation Therapy   Adjuvant radiation    Anti-estrogen oral therapy   Ibrance Letrozole, Zolodex, Xgeva     CHIEF COMPLIANT: Follow-up of metastatic breast cancer  INTERVAL HISTORY: Melissa [name] a 30y.o. with above-mentioned history of metastatic breast cancer with osseous metastases who is currently on treatment with Faslodex, letrozole, Ibrance plus Zoladex along with Xgeva. Melissa [name] presents to the clinic today for follow-up.   ALLERGIES:  has No Known Allergies.  MEDICATIONS:  Current Outpatient Medications  Medication Sig Dispense Refill   anastrozole (ARIMIDEX) 1 MG tablet Take 1 tablet (1 mg total) by mouth daily. 90 tablet 3   carvedilol (COREG) 6.25 MG tablet Take 1 tablet (6.25 mg total) by mouth 2 (two) times daily. 60 tablet 11   FARXIGA 10 MG TABS tablet TAKE 1 TABLET  BY MOUTH DAILY BEFORE BREAKFAST. 30 tablet 11   furosemide (LASIX) 20 MG tablet Take 1 tablet (20 mg total) by mouth daily as needed for fluid or edema. 90 tablet 2   gabapentin (NEURONTIN) 100 MG  capsule Take 1 capsule (100 mg total) by mouth at bedtime. 30 capsule 0   palbociclib (IBRANCE) 100 MG tablet TAKE 1 TABLET BY MOUTH DAILY. TAKE FOR 21 DAYS ON, 7 DAYS OFF, REPEAT EVERY 28 DAYS. 21 tablet 6   sacubitril-valsartan (ENTRESTO) 97-103 MG Take 1 tablet by mouth 2 (two) times daily. 60 tablet 11   traMADol (ULTRAM) 50 MG tablet Take 50 mg by mouth every 6 (six) hours as needed.     No current facility-administered medications for this visit.    PHYSICAL EXAMINATION: ECOG PERFORMANCE STATUS: 1 - Symptomatic but completely ambulatory  Vitals:   09/05/20 1035  BP: 107/65  Pulse: 67  Resp: 18  Temp: 97.7 F (36.5 C)  SpO2: 99%   Filed Weights   09/05/20 1035  Weight: 187 lb 9.6 oz (85.1 kg)      LABORATORY DATA:  I have reviewed the data as listed CMP Latest Ref Rng & Units 07/05/2020 06/08/2020 05/13/2020  Glucose 70 - 99 mg/dL 96 91 97  BUN 6 - 20 mg/dL _0 Creatinine 0.44 - 1.00 mg/dL 0.83 0.77 0.68  Sodium 135 - 145 mmol/L 141 142 139  Potassium 3.5 - 5.1 mmol/L 3.9 4.2 4.0  Chloride 98 - 111 mmol/L 107 105 108  CO2 22 - 32 mmol/L _1 Calcium 8.9 - 10.3 mg/dL 8.6(L) 9.0 8.3(L)  Total Protein 6.5 - 8.1 g/dL 7.3 8.3(H) 7.2  Total Bilirubin 0.3 - 1.2 mg/dL 0.5 0.4 0.2(L)  Alkaline Phos 38 - 126 U/L 48 69 79  AST 15 - 41 U/L 17 14(L) 15  ALT 0 - 44 U/L _2 Lab Results  Component Value Date   WBC 2.0 (L) 07/05/2020   HGB 12.2 07/05/2020   HCT 36.4 07/05/2020   MCV 94.8 07/05/2020   PLT 161 07/05/2020   NEUTROABS 0.8 (L) 07/05/2020    ASSESSMENT & PLAN:  Malignant neoplasm of overlapping sites of left breast in female, estrogen receptor positive ([place]) 02/26/2019:Pregnant lady with 55-monthhistory of left breast swelling and skin thickening, mammogram showed pleomorphic calcifications 8 cm, axilla lymph node biopsy positive ER 60%, PR 60%, Ki-67 50%, Melissa [name]-2 negative ratio 1.25, anterior and posterior end of the calcifications biopsied: Grade  3 IDC with DCIS with lymphovascular invasion ER 50%, PR 30%, Ki-67 50%, Melissa [name]-2 negative ratio 1.03 T4N1 stage IIIb clinical stage 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  Neoadjuvant chemotherapy with dose dense Adriamycin and Cytoxan given every 3 weeks without Neulasta support given Melissa [name] pregnancy completed 05/28/2019 2.  Post-Partum weekly Taxol x12 starting 07/03/2019 3.  Bilateral mastectomy 10/08/2019: Right mastectomy: Multifocal grade 2 IDC 0.8 cm and 0.6 cm with high-grade DCIS, margins negative, 0/4 lymph nodes ER 95%, PR 5%, Ki-67 10%, Melissa [name]-2 negative; left mastectomy: IDC 0.6 cm, LVI, margins negative, 1/4 lymph nodes positive, ER 60%, PR 60%, Melissa [name]-2 negative, Ki-67 50% RCB class II 4.  Adjuvant radiation therapy 5.  Followed by adjuvant antiestrogen therapy with complete estrogen blockade (ovarian suppression with AI) CT C/A/P on 5/13 shows sclerotic bone lesions, bone scan negative 6.  Declined CDK 4 and 6 inhibitor abemaciclib with antiestrogen therapy ---------------------------------------------------------------------------------------------------------------------------------------------------- Bone  scan 04/01/2020: Interval development of multifocal abnormal areas of increased tracer uptake compatible with diffuse bone metastases bony pelvis, right acetabulum, left pubic bone, anterior left lower rib, thoracic and upper lumbar spine including T6, T9, L1, intertrochanteric region of right femur   03/31/2020: CT CAP: Bone metastases.  No visceral mets.   Treatment plan: Faslodex + letrozole + Ibrance plus Zoladex along with [place] Toxicities: Neutropenia: Stable white blood cell count on 100 mg of Ibrance.  Melissa [name] is tolerating it very well.   07/15/20: CT CAP: Previous bone lesions show sclerotic changes suggesting healing.  Interval increase in size of small right axillary lymph nodes.  No evidence of any  distant soft tissue mets 07/15/2020: Bone scan: Response in the bones is noted.   Teeth extraction on May 12th: hold off on giving Xgeva on 08/05/2020. Melissa [name] has healed well from the dental surgery. We will resume Xgeva.  Breast reconstruction discussion: Melissa [name] went to [facility] for DIEP flap and they said that they would be able to do the procedure but wanted to know if it was worth it because Melissa [name] has metastatic disease.  In my opinion Melissa [name] really wants to have reconstruction done and therefore I supported Melissa [name] decision.  Melissa [name] was in tears discussing Melissa [name] prognosis today. 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 Return to clinic monthly for injections and every 3 months for labs and follow-up.    No orders of the defined types were placed in this encounter.  The patient has a good understanding of the overall plan. Melissa [name] agrees with it. Melissa [name] will call with any problems that may develop before the next visit here.  Total time spent: 30 mins including face to face time and time spent for planning, charting and coordination of care  Rulon Eisenmenger, MD, MPH 09/05/2020  I, Thana Ates, am acting as scribe for Dr. Nicholas Lose.  I have reviewed the above documentation for accuracy and completeness, and I agree with the above.

## 2020-09-05 ENCOUNTER — Ambulatory Visit: Payer: Medicaid Other

## 2020-09-05 ENCOUNTER — Inpatient Hospital Stay: Payer: Medicaid Other

## 2020-09-05 ENCOUNTER — Inpatient Hospital Stay (HOSPITAL_BASED_OUTPATIENT_CLINIC_OR_DEPARTMENT_OTHER): Payer: Medicaid Other | Admitting: Hematology and Oncology

## 2020-09-05 ENCOUNTER — Ambulatory Visit: Payer: Medicaid Other | Admitting: Hematology and Oncology

## 2020-09-05 ENCOUNTER — Other Ambulatory Visit: Payer: Medicaid Other

## 2020-09-05 ENCOUNTER — Other Ambulatory Visit: Payer: Self-pay

## 2020-09-05 ENCOUNTER — Inpatient Hospital Stay: Payer: Medicaid Other | Attending: Hematology and Oncology

## 2020-09-05 DIAGNOSIS — Z9013 Acquired absence of bilateral breasts and nipples: Secondary | ICD-10-CM | POA: Insufficient documentation

## 2020-09-05 DIAGNOSIS — C7951 Secondary malignant neoplasm of bone: Secondary | ICD-10-CM | POA: Diagnosis present

## 2020-09-05 DIAGNOSIS — Z5111 Encounter for antineoplastic chemotherapy: Secondary | ICD-10-CM | POA: Insufficient documentation

## 2020-09-05 DIAGNOSIS — Z7984 Long term (current) use of oral hypoglycemic drugs: Secondary | ICD-10-CM | POA: Insufficient documentation

## 2020-09-05 DIAGNOSIS — Z79899 Other long term (current) drug therapy: Secondary | ICD-10-CM | POA: Diagnosis not present

## 2020-09-05 DIAGNOSIS — C50812 Malignant neoplasm of overlapping sites of left female breast: Secondary | ICD-10-CM

## 2020-09-05 DIAGNOSIS — Z170 Estrogen receptor positive status [ER+]: Secondary | ICD-10-CM | POA: Insufficient documentation

## 2020-09-05 DIAGNOSIS — Z95828 Presence of other vascular implants and grafts: Secondary | ICD-10-CM

## 2020-09-05 LAB — CMP (CANCER CENTER ONLY)
ALT: 13 U/L (ref 0–44)
AST: 17 U/L (ref 15–41)
Albumin: 3.7 g/dL (ref 3.5–5.0)
Alkaline Phosphatase: 46 U/L (ref 38–126)
Anion gap: 8 (ref 5–15)
BUN: 14 mg/dL (ref 6–20)
CO2: 28 mmol/L (ref 22–32)
Calcium: 9 mg/dL (ref 8.9–10.3)
Chloride: 105 mmol/L (ref 98–111)
Creatinine: 0.75 mg/dL (ref 0.44–1.00)
GFR, Estimated: >60 mL/min (ref >60)
Glucose, Bld: 92 mg/dL (ref 70–99)
Potassium: 4.2 mmol/L (ref 3.5–5.1)
Sodium: 141 mmol/L (ref 135–145)
Total Bilirubin: 0.4 mg/dL (ref 0.3–1.2)
Total Protein: 7.2 g/dL (ref 6.5–8.1)

## 2020-09-05 LAB — CBC WITH DIFFERENTIAL (CANCER CENTER ONLY)
Abs Immature Granulocytes: 0.01 10*3/uL (ref 0.00–0.07)
Basophils Absolute: 0.1 10*3/uL (ref 0.0–0.1)
Basophils Relative: 2 %
Eosinophils Absolute: 0 10*3/uL (ref 0.0–0.5)
Eosinophils Relative: 1 %
HCT: 34.8 % — ABNORMAL LOW (ref 36.0–46.0)
Hemoglobin: 12.1 g/dL (ref 12.0–15.0)
Immature Granulocytes: 0 %
Lymphocytes Relative: 41 %
Lymphs Abs: 1.3 10*3/uL (ref 0.7–4.0)
MCH: 34.6 pg — ABNORMAL HIGH (ref 26.0–34.0)
MCHC: 34.8 g/dL (ref 30.0–36.0)
MCV: 99.4 fL (ref 80.0–100.0)
Monocytes Absolute: 0.3 10*3/uL (ref 0.1–1.0)
Monocytes Relative: 10 %
Neutro Abs: 1.4 10*3/uL — ABNORMAL LOW (ref 1.7–7.7)
Neutrophils Relative %: 46 %
Platelet Count: 129 10*3/uL — ABNORMAL LOW (ref 150–400)
RBC: 3.5 MIL/uL — ABNORMAL LOW (ref 3.87–5.11)
RDW: 14.4 % (ref 11.5–15.5)
WBC Count: 3.1 10*3/uL — ABNORMAL LOW (ref 4.0–10.5)
nRBC: 0 % (ref 0.0–0.2)

## 2020-09-05 MED ORDER — GOSERELIN ACETATE 3.6 MG ~~LOC~~ IMPL
DRUG_IMPLANT | SUBCUTANEOUS | Status: AC
  Filled 2020-09-05: qty 3.6

## 2020-09-05 MED ORDER — GOSERELIN ACETATE 3.6 MG ~~LOC~~ IMPL
3.6000 mg | DRUG_IMPLANT | Freq: Once | SUBCUTANEOUS | Status: AC
  Administered 2020-09-05: 3.6 mg via SUBCUTANEOUS

## 2020-09-05 NOTE — Assessment & Plan Note (Signed)
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 Neoadjuvant chemotherapy with dose dense Adriamycin and Cytoxan given every 3 weeks without Neulasta support given her pregnancycompleted 05/28/2019 2. Post-Partumweekly Taxol x12starting 07/03/2019 3. Bilateral mastectomy 10/08/2019: Right mastectomy: Multifocal grade 2 IDC 0.8 cm and 0.6 cm with high-grade DCIS, margins negative, 0/4lymph nodes ER 95%, PR 5%, Ki-67 10%, HER-2 negative;left mastectomy: IDC 0.6 cm, LVI, margins negative, 1/4 lymph nodes positive, ER 60%, PR 60%, HER-2 negative, Ki-67 50% RCB class II 4. Adjuvant radiation therapy 5. Followed by adjuvant antiestrogen therapy with complete estrogen blockade (ovarian suppression with AI) CT C/A/P on 5/13 shows sclerotic bone lesions, bone scan negative 6.DeclinedCDK 4 and 6 inhibitor abemaciclibwith antiestrogen therapy ---------------------------------------------------------------------------------------------------------------------------------------------------- Bone scan 04/01/2020: Interval development of multifocal abnormal areas of increased tracer uptake compatible with diffuse bone metastases bony pelvis, right acetabulum, left pubic bone, anterior left lower rib, thoracic and upper lumbar spine including T6, T9, L1, intertrochanteric region of right femur  03/31/2020: CT CAP: Bone metastases. No visceral mets.  Treatment plan: Faslodex+letrozole +Ibrance plus Zoladex along with  Xgeva  IbranceToxicities: Neutropenia:Stable white blood cell count on 100 mg of Ibrance. She is tolerating it very well.  07/15/20:CT CAP: Previous bone lesions show sclerotic changes suggesting healing.  Interval increase in size of small right axillary lymph nodes.  No evidence of any distant soft tissue mets 07/15/2020: Bone scan: Response in the bones is noted.  Teeth extraction on May 12th: hold off on giving Xgeva on 08/05/2020. I will see her back in person on 09/05/2020 with labs and injection appointments including Xgeva at that time if she is fully healed from the teeth extraction.

## 2020-09-06 ENCOUNTER — Telehealth: Payer: Self-pay | Admitting: Hematology and Oncology

## 2020-09-06 NOTE — Telephone Encounter (Signed)
Scheduled per 6/27 los. Pt will receive an updated appt calendar per next visit appt notes

## 2020-09-20 ENCOUNTER — Encounter: Payer: Self-pay | Admitting: Hematology and Oncology

## 2020-09-23 ENCOUNTER — Other Ambulatory Visit (HOSPITAL_COMMUNITY): Payer: Self-pay

## 2020-09-23 MED FILL — Palbociclib Tab 100 MG: ORAL | 28 days supply | Qty: 21 | Fill #3 | Status: AC

## 2020-09-26 ENCOUNTER — Other Ambulatory Visit (HOSPITAL_COMMUNITY): Payer: Self-pay

## 2020-10-05 ENCOUNTER — Ambulatory Visit: Payer: Medicaid Other

## 2020-10-10 ENCOUNTER — Other Ambulatory Visit: Payer: Self-pay

## 2020-10-10 ENCOUNTER — Inpatient Hospital Stay: Payer: Medicaid Other | Attending: Hematology and Oncology

## 2020-10-10 VITALS — BP 117/81 | HR 70 | Temp 98.7°F | Resp 18

## 2020-10-10 DIAGNOSIS — Z95828 Presence of other vascular implants and grafts: Secondary | ICD-10-CM

## 2020-10-10 DIAGNOSIS — C7951 Secondary malignant neoplasm of bone: Secondary | ICD-10-CM | POA: Insufficient documentation

## 2020-10-10 DIAGNOSIS — Z5111 Encounter for antineoplastic chemotherapy: Secondary | ICD-10-CM | POA: Insufficient documentation

## 2020-10-10 DIAGNOSIS — Z170 Estrogen receptor positive status [ER+]: Secondary | ICD-10-CM | POA: Diagnosis not present

## 2020-10-10 DIAGNOSIS — C50812 Malignant neoplasm of overlapping sites of left female breast: Secondary | ICD-10-CM

## 2020-10-10 MED ORDER — GOSERELIN ACETATE 3.6 MG ~~LOC~~ IMPL
3.6000 mg | DRUG_IMPLANT | Freq: Once | SUBCUTANEOUS | Status: AC
  Administered 2020-10-10: 3.6 mg via SUBCUTANEOUS

## 2020-10-10 MED ORDER — GOSERELIN ACETATE 3.6 MG ~~LOC~~ IMPL
DRUG_IMPLANT | SUBCUTANEOUS | Status: AC
  Filled 2020-10-10: qty 3.6

## 2020-10-10 NOTE — Patient Instructions (Signed)
Goserelin injection What is this medication? GOSERELIN (GOE se rel in) is similar to a hormone found in the body. It lowers the amount of sex hormones that the body makes. Men will have lower testosterone levels and women will have lower estrogen levels while taking this medicine. In men, this medicine is used to treat prostate cancer; the injection is either given once per month or once every 12 weeks. A once per month injection (only) is used to treat women with endometriosis, dysfunctional uterine bleeding, or advanced breast cancer. This medicine may be used for other purposes; ask your health care provider or pharmacist if you have questions. COMMON BRAND NAME(S): Zoladex What should I tell my care team before I take this medication? They need to know if you have any of these conditions: bone problems diabetes heart disease history of irregular heartbeat an unusual or allergic reaction to goserelin, other medicines, foods, dyes, or preservatives pregnant or trying to get pregnant breast-feeding How should I use this medication? This medicine is for injection under the skin. It is given by a health care professional in a hospital or clinic setting. Talk to your pediatrician regarding the use of this medicine in children. Special care may be needed. Overdosage: If you think you have taken too much of this medicine contact a poison control center or emergency room at once. NOTE: This medicine is only for you. Do not share this medicine with others. What if I miss a dose? It is important not to miss your dose. Call your doctor or health care professional if you are unable to keep an appointment. What may interact with this medication? Do not take this medicine with any of the following medications: cisapride dronedarone pimozide thioridazine This medicine may also interact with the following medications: other medicines that prolong the QT interval (an abnormal heart rhythm) This list  may not describe all possible interactions. Give your health care provider a list of all the medicines, herbs, non-prescription drugs, or dietary supplements you use. Also tell them if you smoke, drink alcohol, or use illegal drugs. Some items may interact with your medicine. What should I watch for while using this medication? Visit your doctor or health care provider for regular checks on your progress. Your symptoms may appear to get worse during the first weeks of this therapy. Tell your doctor or healthcare provider if your symptoms do not start to get better or if they get worse after this time. Your bones may get weaker if you take this medicine for a long time. If you smoke or frequently drink alcohol you may increase your risk of bone loss. A family history of osteoporosis, chronic use of drugs for seizures (convulsions), or corticosteroids can also increase your risk of bone loss. Talk to your doctor about how to keep your bones strong. This medicine should stop regular monthly menstruation in women. Tell your doctor if you continue to menstruate. Women should not become pregnant while taking this medicine or for 12 weeks after stopping this medicine. Women should inform their doctor if they wish to become pregnant or think they might be pregnant. There is a potential for serious side effects to an unborn child. Talk to your health care professional or pharmacist for more information. Do not breast-feed an infant while taking this medicine. Men should inform their doctors if they wish to father a child. This medicine may lower sperm counts. Talk to your health care professional or pharmacist for more information. This medicine may   increase blood sugar. Ask your healthcare provider if changes in diet or medicines are needed if you have diabetes. What side effects may I notice from receiving this medication? Side effects that you should report to your doctor or health care professional as soon as  possible: allergic reactions like skin rash, itching or hives, swelling of the face, lips, or tongue bone pain breathing problems changes in vision chest pain feeling faint or lightheaded, falls fever, chills pain, swelling, warmth in the leg pain, tingling, numbness in the hands or feet signs and symptoms of high blood sugar such as being more thirsty or hungry or having to urinate more than normal. You may also feel very tired or have blurry vision signs and symptoms of low blood pressure like dizziness; feeling faint or lightheaded, falls; unusually weak or tired stomach pain swelling of the ankles, feet, hands trouble passing urine or change in the amount of urine unusually high or low blood pressure unusually weak or tired Side effects that usually do not require medical attention (report to your doctor or health care professional if they continue or are bothersome): change in sex drive or performance changes in breast size in both males and females changes in emotions or moods headache hot flashes irritation at site where injected loss of appetite skin problems like acne, dry skin vaginal dryness This list may not describe all possible side effects. Call your doctor for medical advice about side effects. You may report side effects to FDA at 1-800-FDA-1088. Where should I keep my medication? This drug is given in a hospital or clinic and will not be stored at home. NOTE: This sheet is a summary. It may not cover all possible information. If you have questions about this medicine, talk to your doctor, pharmacist, or health care provider.  2022 Elsevier/Gold Standard (2018-06-16 14:05:56)  

## 2020-10-24 ENCOUNTER — Other Ambulatory Visit (HOSPITAL_COMMUNITY): Payer: Self-pay

## 2020-10-24 MED FILL — Palbociclib Tab 100 MG: ORAL | 28 days supply | Qty: 21 | Fill #4 | Status: AC

## 2020-10-27 ENCOUNTER — Other Ambulatory Visit (HOSPITAL_COMMUNITY): Payer: Self-pay

## 2020-11-03 ENCOUNTER — Telehealth: Payer: Self-pay | Admitting: *Deleted

## 2020-11-03 DIAGNOSIS — C50812 Malignant neoplasm of overlapping sites of left female breast: Secondary | ICD-10-CM

## 2020-11-03 NOTE — Telephone Encounter (Signed)
Melissa [name] states MD at [facility] is moving to [place]. Requested referral to another plastic surgeon.  Referral sent to Dr Trellis Moment at [facility]

## 2020-11-04 ENCOUNTER — Inpatient Hospital Stay: Payer: Medicaid Other

## 2020-11-04 ENCOUNTER — Other Ambulatory Visit: Payer: Self-pay

## 2020-11-04 VITALS — BP 108/83 | HR 58 | Temp 99.2°F | Resp 16

## 2020-11-04 DIAGNOSIS — C50812 Malignant neoplasm of overlapping sites of left female breast: Secondary | ICD-10-CM

## 2020-11-04 DIAGNOSIS — Z170 Estrogen receptor positive status [ER+]: Secondary | ICD-10-CM

## 2020-11-04 DIAGNOSIS — Z95828 Presence of other vascular implants and grafts: Secondary | ICD-10-CM

## 2020-11-04 DIAGNOSIS — Z5111 Encounter for antineoplastic chemotherapy: Secondary | ICD-10-CM | POA: Diagnosis not present

## 2020-11-04 LAB — CMP (CANCER CENTER ONLY)
ALT: 20 U/L (ref 0–44)
AST: 22 U/L (ref 15–41)
Albumin: 4.1 g/dL (ref 3.5–5.0)
Alkaline Phosphatase: 54 U/L (ref 38–126)
Anion gap: 9 (ref 5–15)
BUN: 13 mg/dL (ref 6–20)
CO2: 26 mmol/L (ref 22–32)
Calcium: 9.4 mg/dL (ref 8.9–10.3)
Chloride: 106 mmol/L (ref 98–111)
Creatinine: 1.02 mg/dL — ABNORMAL HIGH (ref 0.44–1.00)
GFR, Estimated: >60 mL/min (ref >60)
Glucose, Bld: 69 mg/dL — ABNORMAL LOW (ref 70–99)
Potassium: 4.1 mmol/L (ref 3.5–5.1)
Sodium: 141 mmol/L (ref 135–145)
Total Bilirubin: 0.4 mg/dL (ref 0.3–1.2)
Total Protein: 7.6 g/dL (ref 6.5–8.1)

## 2020-11-04 LAB — CBC WITH DIFFERENTIAL (CANCER CENTER ONLY)
Abs Immature Granulocytes: 0.01 10*3/uL (ref 0.00–0.07)
Basophils Absolute: 0.1 10*3/uL (ref 0.0–0.1)
Basophils Relative: 2 %
Eosinophils Absolute: 0 10*3/uL (ref 0.0–0.5)
Eosinophils Relative: 1 %
HCT: 36.9 % (ref 36.0–46.0)
Hemoglobin: 12.5 g/dL (ref 12.0–15.0)
Immature Granulocytes: 0 %
Lymphocytes Relative: 39 %
Lymphs Abs: 1 10*3/uL (ref 0.7–4.0)
MCH: 34.2 pg — ABNORMAL HIGH (ref 26.0–34.0)
MCHC: 33.9 g/dL (ref 30.0–36.0)
MCV: 101.1 fL — ABNORMAL HIGH (ref 80.0–100.0)
Monocytes Absolute: 0.3 10*3/uL (ref 0.1–1.0)
Monocytes Relative: 9 %
Neutro Abs: 1.3 10*3/uL — ABNORMAL LOW (ref 1.7–7.7)
Neutrophils Relative %: 49 %
Platelet Count: 163 10*3/uL (ref 150–400)
RBC: 3.65 MIL/uL — ABNORMAL LOW (ref 3.87–5.11)
RDW: 13.4 % (ref 11.5–15.5)
WBC Count: 2.7 10*3/uL — ABNORMAL LOW (ref 4.0–10.5)
nRBC: 0 % (ref 0.0–0.2)

## 2020-11-04 MED ORDER — SODIUM CHLORIDE 0.9 % IV SOLN: Freq: Once | INTRAVENOUS | Status: DC | PRN

## 2020-11-04 MED ORDER — DENOSUMAB 120 MG/1.7ML ~~LOC~~ SOLN
120.0000 mg | Freq: Once | SUBCUTANEOUS | Status: AC
  Administered 2020-11-04: 120 mg via SUBCUTANEOUS
  Filled 2020-11-04: qty 1.7

## 2020-11-04 MED ORDER — METHYLPREDNISOLONE SODIUM SUCC 125 MG IJ SOLR: 125.0000 mg | Freq: Once | INTRAMUSCULAR | Status: DC | PRN

## 2020-11-04 MED ORDER — EPINEPHRINE 0.3 MG/0.3ML IJ SOAJ: 0.3000 mg | Freq: Once | INTRAMUSCULAR | Status: DC | PRN

## 2020-11-04 MED ORDER — DIPHENHYDRAMINE HCL 50 MG/ML IJ SOLN: 25.0000 mg | Freq: Once | INTRAMUSCULAR | Status: DC | PRN

## 2020-11-04 MED ORDER — DIPHENHYDRAMINE HCL 50 MG/ML IJ SOLN: 50.0000 mg | Freq: Once | INTRAMUSCULAR | Status: DC | PRN

## 2020-11-04 MED ORDER — ALBUTEROL SULFATE (2.5 MG/3ML) 0.083% IN NEBU: 2.5000 mg | INHALATION_SOLUTION | Freq: Once | RESPIRATORY_TRACT | Status: DC | PRN

## 2020-11-22 ENCOUNTER — Other Ambulatory Visit: Payer: Self-pay | Admitting: Hematology and Oncology

## 2020-11-22 ENCOUNTER — Other Ambulatory Visit (HOSPITAL_COMMUNITY): Payer: Self-pay

## 2020-11-22 DIAGNOSIS — C50919 Malignant neoplasm of unspecified site of unspecified female breast: Secondary | ICD-10-CM

## 2020-11-22 MED ORDER — PALBOCICLIB 100 MG PO TABS
ORAL_TABLET | ORAL | 6 refills | Status: DC
  Filled 2020-11-22: qty 21, 28d supply, fill #0
  Filled 2020-12-19: qty 21, 28d supply, fill #1
  Filled 2021-01-17: qty 21, 28d supply, fill #2
  Filled 2021-02-13: qty 21, 28d supply, fill #3

## 2020-11-23 ENCOUNTER — Other Ambulatory Visit (HOSPITAL_COMMUNITY): Payer: Self-pay

## 2020-12-03 NOTE — Progress Notes (Signed)
Patient Care Team: [name], NP as PCP - General (Nurse Practitioner) [name], RN as Oncology Nurse Navigator Rockwell Germany, RN as Oncology Nurse Navigator Rolm Bookbinder, MD as Surgeon (General Surgery) Nicholas Lose, MD as Medical Oncologist (Hematology and Oncology)  DIAGNOSIS:    ICD-10-CM   1. Metastatic breast cancer ([place])  C50.919 CT CHEST ABDOMEN PELVIS W CONTRAST    2. Malignant neoplasm of overlapping sites of left breast in female, estrogen receptor positive ([place])  C50.812 CT CHEST ABDOMEN PELVIS W CONTRAST   Z17.0       SUMMARY OF ONCOLOGIC HISTORY: Oncology History  Malignant neoplasm of overlapping sites of left breast in female, estrogen receptor positive ([place])  02/26/2019 Initial Diagnosis   Pregnant lady with 27-month history of left breast swelling and skin thickening, mammogram showed pleomorphic calcifications 8 cm, axilla lymph node biopsy positive ER 60%, PR 60%, Ki-67 50%, HER-2 negative ratio 1.25, anterior and posterior end of the calcifications biopsied: 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 ER 50%, PR 30%, Ki-67 50%, HER-2 negative ratio 1.03   03/04/2019 Cancer Staging   Staging form: Breast, AJCC 8th Edition - Clinical stage from 03/04/2019: Stage IIIB (cT4, cN1, cM0, G3, ER+, PR+, HER2-) - Signed by Nicholas Lose, MD on 03/04/2019    Neo-Adjuvant Chemotherapy   Adriamycin and Cytoxan x 4 given every three weeks without Neulasta support., followed by weekly Taxol x 12 versus Taxotere Cytoxan after her delivery   04/15/2019 Genetic Testing   Negative genetic testing:  No pathogenic variants detected on the [employer] Breast Cancer Guidelines-Based Panel or the Common Hereditary Cancers Panel. The report date is 04/15/2019.  The Breast Cancer Guidelines-Based panel offered by [facility] includes sequencing and rearrangement analysis for the following 11 genes:  ATM, BRCA1, BRCA2, CDH1, CHEK2, NBN, NF1, PALB2, PTEN, STK11 and TP53.   The Common Hereditary Cancers Panel offered by [facility] includes sequencing and/or deletion duplication testing of the following 48 genes: APC, ATM, AXIN2, BARD1, BMPR1A, BRCA1, BRCA2, BRIP1, CDH1, CDK4, CDKN2A (p14ARF), CDKN2A (p16INK4a), CHEK2, CTNNA1, DICER1, EPCAM (Deletion/duplication testing only), GREM1 (promoter region deletion/duplication testing only), KIT, MEN1, MLH1, MSH2, MSH3, MSH6, MUTYH, NBN, NF1, NHTL1, PALB2, PDGFRA, PMS2, POLD1, POLE, PTEN, RAD50, RAD51C, RAD51D, RNF43, SDHB, SDHC, SDHD, SMAD4, SMARCA4. STK11, TP53, TSC1, TSC2, and VHL.  The following genes were evaluated for sequence changes only: SDHA and HOXB13 c.251G>A variant only.    10/08/2019 Surgery   Bilateral mastectomies [name]):  Right mastectomy: Multifocal grade 2 IDC 0.8 cm and 0.6 cm with high-grade DCIS, margins negative, 0/4 lymph nodes ER 95%, PR 5%, Ki-67 10%, HER-2 negative;  Left mastectomy: IDC 0.6 cm, LVI, margins negative, 1/4 lymph nodes positive, ER 60%, PR 60%, HER-2 negative, Ki-67 50% RCB class II   11/30/2019 - 01/08/2020 Radiation Therapy   Adjuvant radiation    Anti-estrogen oral therapy   Ibrance Letrozole, Zolodex, Xgeva     CHIEF COMPLIANT: Follow-up of metastatic breast cancer  INTERVAL HISTORY: Melissa [name] is a 30 y.o. with above-mentioned history of metastatic breast cancer with osseous metastases who is currently on treatment with Faslodex, letrozole, Ibrance plus Zoladex along with Xgeva. She presents to the clinic today for follow-up.   ALLERGIES:  has No Known Allergies.  MEDICATIONS:  Current Outpatient Medications  Medication Sig Dispense Refill   anastrozole (ARIMIDEX) 1 MG tablet Take 1 tablet (1 mg total) by mouth daily. 90 tablet 3   palbociclib (IBRANCE) 100 MG tablet TAKE 1 TABLET BY MOUTH  DAILY. TAKE FOR 21 DAYS ON, 7 DAYS OFF, REPEAT EVERY 28 DAYS. 21 tablet 6   No current facility-administered medications for this visit.    PHYSICAL EXAMINATION: ECOG  PERFORMANCE STATUS: 1 - Symptomatic but completely ambulatory  Vitals:   12/05/20 1125  BP: 108/74  Pulse: 80  Resp: 18  Temp: (!) 97.2 F (36.2 C)  SpO2: 100%   Filed Weights   12/05/20 1125  Weight: 187 lb 14.4 oz (85.2 kg)      LABORATORY DATA:  I have reviewed the data as listed CMP Latest Ref Rng & Units 11/04/2020 09/05/2020 07/05/2020  Glucose 70 - 99 mg/dL 69(L) 92 96  BUN 6 - 20 mg/dL $Remove'13 14 15  'SMnnYqb$ Creatinine 0.44 - 1.00 mg/dL 1.02(H) 0.75 0.83  Sodium 135 - 145 mmol/L 141 141 141  Potassium 3.5 - 5.1 mmol/L 4.1 4.2 3.9  Chloride 98 - 111 mmol/L 106 105 107  CO2 22 - 32 mmol/L $RemoveB'26 28 25  'QpiKNHyI$ Calcium 8.9 - 10.3 mg/dL 9.4 9.0 8.6(L)  Total Protein 6.5 - 8.1 g/dL 7.6 7.2 7.3  Total Bilirubin 0.3 - 1.2 mg/dL 0.4 0.4 0.5  Alkaline Phos 38 - 126 U/L 54 46 48  AST 15 - 41 U/L $Remo'22 17 17  'XJTcE$ ALT 0 - 44 U/L $Remo'20 13 14    'RTQGM$ Lab Results  Component Value Date   WBC 3.0 (L) 12/05/2020   HGB 12.4 12/05/2020   HCT 36.4 12/05/2020   MCV 100.3 (H) 12/05/2020   PLT 218 12/05/2020   NEUTROABS PENDING 12/05/2020    ASSESSMENT & PLAN:  Malignant neoplasm of overlapping sites of left breast in female, estrogen receptor positive ([place]) 02/26/2019:Pregnant lady with 83-month history of left breast swelling and skin thickening, mammogram showed pleomorphic calcifications 8 cm, axilla lymph node biopsy positive ER 60%, PR 60%, Ki-67 50%, HER-2 negative ratio 1.25, anterior and posterior end of the calcifications biopsied: 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  Neoadjuvant chemotherapy with dose dense Adriamycin and Cytoxan given every 3 weeks without Neulasta support given her pregnancy completed 05/28/2019 2.  Post-Partum weekly Taxol x12 starting 07/03/2019 3.  Bilateral mastectomy 10/08/2019: Right  mastectomy: Multifocal grade 2 IDC 0.8 cm and 0.6 cm with high-grade DCIS, margins negative, 0/4 lymph nodes ER 95%, PR 5%, Ki-67 10%, HER-2 negative; left mastectomy: IDC 0.6 cm, LVI, margins negative, 1/4 lymph nodes positive, ER 60%, PR 60%, HER-2 negative, Ki-67 50% RCB class II 4.  Adjuvant radiation therapy 5.  Followed by adjuvant antiestrogen therapy with complete estrogen blockade (ovarian suppression with AI) CT C/A/P on 5/13 shows sclerotic bone lesions, bone scan negative 6.  Declined CDK 4 and 6 inhibitor abemaciclib with antiestrogen therapy ---------------------------------------------------------------------------------------------------------------------------------------------------- Bone scan 04/01/2020: Interval development of multifocal abnormal areas of increased tracer uptake compatible with diffuse bone metastases bony pelvis, right acetabulum, left pubic bone, anterior left lower rib, thoracic and upper lumbar spine including T6, T9, L1, intertrochanteric region of right femur   03/31/2020: CT CAP: Bone metastases.  No visceral mets.   Treatment plan: Faslodex + letrozole + Ibrance plus Zoladex along with Brock Bad Toxicities: Neutropenia: Stable white blood cell count on 100 mg of Ibrance.  She is tolerating it very well.   07/15/20: CT CAP: Previous bone lesions show sclerotic changes suggesting healing.  Interval increase in size of small right axillary lymph nodes.  No evidence of any distant soft tissue mets 07/15/2020: Bone scan: Response in the bones is noted.   Bone metastases: Currently on Xgeva Breast reconstruction: Patient wants to do DIEP flap at [facility]  She is completing a CMA degree. Return to clinic monthly for injections  Plan to obtain CT chest abdomen pelvis in November and follow-up after that.    Orders Placed This Encounter  Procedures   CT CHEST ABDOMEN PELVIS W CONTRAST    Standing Status:   Future    Standing Expiration Date:   12/05/2021     Order Specific Question:   Is patient pregnant?    Answer:   No    Order Specific Question:   Preferred imaging location?    Answer:   [facility]    Order Specific Question:   Release to patient    Answer:   Immediate    Order Specific Question:   Is Oral Contrast requested for this exam?    Answer:   Yes, Per Radiology protocol    Order Specific Question:   Reason for Exam (SYMPTOM  OR DIAGNOSIS REQUIRED)    Answer:   Metastatic breast cancer restaging    The patient has a good understanding of the overall plan. she agrees with it. she will call with any problems that may develop before the next visit here.  Total time spent: 30 mins including face to face time and time spent for planning, charting and coordination of care  Rulon Eisenmenger, MD, MPH 12/05/2020  I, Thana Ates, am acting as scribe for Dr. Nicholas Lose.  I have reviewed the above documentation for accuracy and completeness, and I agree with the above.

## 2020-12-05 ENCOUNTER — Other Ambulatory Visit: Payer: Self-pay | Admitting: *Deleted

## 2020-12-05 ENCOUNTER — Inpatient Hospital Stay: Payer: Medicaid Other | Attending: Hematology and Oncology

## 2020-12-05 ENCOUNTER — Inpatient Hospital Stay (HOSPITAL_BASED_OUTPATIENT_CLINIC_OR_DEPARTMENT_OTHER): Payer: Medicaid Other | Admitting: Hematology and Oncology

## 2020-12-05 ENCOUNTER — Other Ambulatory Visit: Payer: Self-pay

## 2020-12-05 ENCOUNTER — Inpatient Hospital Stay: Payer: Medicaid Other

## 2020-12-05 VITALS — BP 108/74 | HR 80 | Temp 97.2°F | Resp 18 | Ht 62.0 in | Wt 187.9 lb

## 2020-12-05 DIAGNOSIS — Z923 Personal history of irradiation: Secondary | ICD-10-CM | POA: Insufficient documentation

## 2020-12-05 DIAGNOSIS — C7951 Secondary malignant neoplasm of bone: Secondary | ICD-10-CM | POA: Insufficient documentation

## 2020-12-05 DIAGNOSIS — Z170 Estrogen receptor positive status [ER+]: Secondary | ICD-10-CM | POA: Insufficient documentation

## 2020-12-05 DIAGNOSIS — C50812 Malignant neoplasm of overlapping sites of left female breast: Secondary | ICD-10-CM | POA: Diagnosis not present

## 2020-12-05 DIAGNOSIS — Z9013 Acquired absence of bilateral breasts and nipples: Secondary | ICD-10-CM | POA: Diagnosis not present

## 2020-12-05 DIAGNOSIS — Z5111 Encounter for antineoplastic chemotherapy: Secondary | ICD-10-CM | POA: Diagnosis not present

## 2020-12-05 DIAGNOSIS — Z95828 Presence of other vascular implants and grafts: Secondary | ICD-10-CM

## 2020-12-05 DIAGNOSIS — C50919 Malignant neoplasm of unspecified site of unspecified female breast: Secondary | ICD-10-CM | POA: Diagnosis not present

## 2020-12-05 LAB — CMP (CANCER CENTER ONLY)
ALT: 28 U/L (ref 0–44)
AST: 43 U/L — ABNORMAL HIGH (ref 15–41)
Albumin: 4.1 g/dL (ref 3.5–5.0)
Alkaline Phosphatase: 61 U/L (ref 38–126)
Anion gap: 9 (ref 5–15)
BUN: 9 mg/dL (ref 6–20)
CO2: 26 mmol/L (ref 22–32)
Calcium: 9.2 mg/dL (ref 8.9–10.3)
Chloride: 106 mmol/L (ref 98–111)
Creatinine: 0.94 mg/dL (ref 0.44–1.00)
GFR, Estimated: >60 mL/min (ref >60)
Glucose, Bld: 86 mg/dL (ref 70–99)
Potassium: 3.8 mmol/L (ref 3.5–5.1)
Sodium: 141 mmol/L (ref 135–145)
Total Bilirubin: 0.5 mg/dL (ref 0.3–1.2)
Total Protein: 7.9 g/dL (ref 6.5–8.1)

## 2020-12-05 LAB — CBC WITH DIFFERENTIAL (CANCER CENTER ONLY)
Abs Immature Granulocytes: 0.01 10*3/uL (ref 0.00–0.07)
Basophils Absolute: 0.1 10*3/uL (ref 0.0–0.1)
Basophils Relative: 2 %
Eosinophils Absolute: 0 10*3/uL (ref 0.0–0.5)
Eosinophils Relative: 1 %
HCT: 36.4 % (ref 36.0–46.0)
Hemoglobin: 12.4 g/dL (ref 12.0–15.0)
Immature Granulocytes: 0 %
Lymphocytes Relative: 33 %
Lymphs Abs: 1 10*3/uL (ref 0.7–4.0)
MCH: 34.2 pg — ABNORMAL HIGH (ref 26.0–34.0)
MCHC: 34.1 g/dL (ref 30.0–36.0)
MCV: 100.3 fL — ABNORMAL HIGH (ref 80.0–100.0)
Monocytes Absolute: 0.1 10*3/uL (ref 0.1–1.0)
Monocytes Relative: 4 %
Neutro Abs: 1.8 10*3/uL (ref 1.7–7.7)
Neutrophils Relative %: 60 %
Platelet Count: 218 10*3/uL (ref 150–400)
RBC: 3.63 MIL/uL — ABNORMAL LOW (ref 3.87–5.11)
RDW: 13.3 % (ref 11.5–15.5)
Smear Review: NORMAL
WBC Count: 3 10*3/uL — ABNORMAL LOW (ref 4.0–10.5)
nRBC: 0 % (ref 0.0–0.2)

## 2020-12-05 MED ORDER — GOSERELIN ACETATE 3.6 MG ~~LOC~~ IMPL
3.6000 mg | DRUG_IMPLANT | Freq: Once | SUBCUTANEOUS | Status: AC
  Administered 2020-12-05: 3.6 mg via SUBCUTANEOUS
  Filled 2020-12-05: qty 3.6

## 2020-12-05 NOTE — Assessment & Plan Note (Signed)
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 Neoadjuvant chemotherapy with dose dense Adriamycin and Cytoxan given every 3 weeks without Neulasta support given her pregnancycompleted 05/28/2019 2. Post-Partumweekly Taxol x12starting 07/03/2019 3. Bilateral mastectomy 10/08/2019: Right mastectomy: Multifocal grade 2 IDC 0.8 cm and 0.6 cm with high-grade DCIS, margins negative, 0/4lymph nodes ER 95%, PR 5%, Ki-67 10%, HER-2 negative;left mastectomy: IDC 0.6 cm, LVI, margins negative, 1/4 lymph nodes positive, ER 60%, PR 60%, HER-2 negative, Ki-67 50% RCB class II 4. Adjuvant radiation therapy 5. Followed by adjuvant antiestrogen therapy with complete estrogen blockade (ovarian suppression with AI) CT C/A/P on 5/13 shows sclerotic bone lesions, bone scan negative 6.DeclinedCDK 4 and 6 inhibitor abemaciclibwith antiestrogen therapy ---------------------------------------------------------------------------------------------------------------------------------------------------- Bone scan 04/01/2020: Interval development of multifocal abnormal areas of increased tracer uptake compatible with diffuse bone metastases bony pelvis, right acetabulum, left pubic bone, anterior left lower rib, thoracic and upper lumbar spine including T6, T9, L1, intertrochanteric region of right femur  03/31/2020: CT CAP: Bone metastases. No visceral mets.  Treatment plan: Faslodex+letrozole +Ibrance plus Zoladex along with  Xgeva  IbranceToxicities: Neutropenia:Stable white blood cell count on 100 mg of Ibrance. She is tolerating it very well.  07/15/20:CT CAP: Previous bone lesionsshow sclerotic changes suggesting healing. Interval increase in size of small right axillary lymph nodes. No evidence of any distant soft tissue mets 07/15/2020: Bone scan: Response in the bones is noted.  Bone metastases: Currently on Xgeva Breast reconstruction: Patient wants to do DIEP flap at [facility]  She is completing a CMA degree. Return to clinic monthly for injections and every 3 months for labs and follow-up.

## 2020-12-19 ENCOUNTER — Other Ambulatory Visit (HOSPITAL_COMMUNITY): Payer: Self-pay

## 2020-12-20 ENCOUNTER — Other Ambulatory Visit (HOSPITAL_COMMUNITY): Payer: Self-pay

## 2020-12-21 ENCOUNTER — Other Ambulatory Visit (HOSPITAL_COMMUNITY): Payer: Self-pay

## 2021-01-04 ENCOUNTER — Inpatient Hospital Stay: Payer: Medicaid Other

## 2021-01-06 ENCOUNTER — Inpatient Hospital Stay: Payer: Medicaid Other | Attending: Hematology and Oncology

## 2021-01-06 ENCOUNTER — Other Ambulatory Visit: Payer: Self-pay

## 2021-01-06 VITALS — BP 107/72 | HR 57 | Temp 98.8°F | Resp 16

## 2021-01-06 DIAGNOSIS — C50812 Malignant neoplasm of overlapping sites of left female breast: Secondary | ICD-10-CM | POA: Diagnosis not present

## 2021-01-06 DIAGNOSIS — Z170 Estrogen receptor positive status [ER+]: Secondary | ICD-10-CM | POA: Insufficient documentation

## 2021-01-06 DIAGNOSIS — C7951 Secondary malignant neoplasm of bone: Secondary | ICD-10-CM | POA: Diagnosis present

## 2021-01-06 DIAGNOSIS — Z95828 Presence of other vascular implants and grafts: Secondary | ICD-10-CM

## 2021-01-06 MED ORDER — GOSERELIN ACETATE 3.6 MG ~~LOC~~ IMPL
3.6000 mg | DRUG_IMPLANT | Freq: Once | SUBCUTANEOUS | Status: AC
  Administered 2021-01-06: 3.6 mg via SUBCUTANEOUS
  Filled 2021-01-06: qty 3.6

## 2021-01-15 ENCOUNTER — Other Ambulatory Visit: Payer: Self-pay | Admitting: Hematology and Oncology

## 2021-01-16 ENCOUNTER — Encounter: Payer: Self-pay | Admitting: Hematology and Oncology

## 2021-01-17 ENCOUNTER — Other Ambulatory Visit (HOSPITAL_COMMUNITY): Payer: Self-pay

## 2021-01-18 ENCOUNTER — Other Ambulatory Visit (HOSPITAL_COMMUNITY): Payer: Self-pay

## 2021-02-07 ENCOUNTER — Ambulatory Visit (HOSPITAL_COMMUNITY): Payer: Medicaid Other

## 2021-02-07 ENCOUNTER — Telehealth: Payer: Self-pay | Admitting: *Deleted

## 2021-02-07 NOTE — Telephone Encounter (Signed)
Received call from pt stating CT appt was missed today due to being in the ED at [facility].  Pt states she was seen in ED 02/07/21 for abdominal pain.  Pt states a abd CT and [place] was completed and requesting our office to obtain results for MD to review.  RN placed call to [place] and LVM with records request information.

## 2021-02-08 ENCOUNTER — Other Ambulatory Visit: Payer: Self-pay | Admitting: *Deleted

## 2021-02-08 DIAGNOSIS — Z170 Estrogen receptor positive status [ER+]: Secondary | ICD-10-CM

## 2021-02-08 DIAGNOSIS — C50812 Malignant neoplasm of overlapping sites of left female breast: Secondary | ICD-10-CM

## 2021-02-08 NOTE — Progress Notes (Signed)
Verbal orders received by MD based off finding of recent abdominal CT an order has been placed for PET scan.  RN will schedule scan once Pa is obtained.

## 2021-02-08 NOTE — Progress Notes (Addendum)
Patient Care Team: [name], NP as PCP - General (Nurse Practitioner) [name], RN as Oncology Nurse Navigator Rockwell Germany, RN as Oncology Nurse Navigator Rolm Bookbinder, MD as Surgeon (General Surgery) Nicholas Lose, MD as Medical Oncologist (Hematology and Oncology)  DIAGNOSIS:    ICD-10-CM   1. Malignant neoplasm of overlapping sites of left breast in female, estrogen receptor positive ([place])  C50.812    Z17.0       SUMMARY OF ONCOLOGIC HISTORY: Oncology History  Malignant neoplasm of overlapping sites of left breast in female, estrogen receptor positive ([place])  02/26/2019 Initial Diagnosis   Pregnant lady with 73-month history of left breast swelling and skin thickening, mammogram showed pleomorphic calcifications 8 cm, axilla lymph node biopsy positive ER 60%, PR 60%, Ki-67 50%, HER-2 negative ratio 1.25, anterior and posterior end of the calcifications biopsied: 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 ER 50%, PR 30%, Ki-67 50%, HER-2 negative ratio 1.03   03/04/2019 Cancer Staging   Staging form: Breast, AJCC 8th Edition - Clinical stage from 03/04/2019: Stage IIIB (cT4, cN1, cM0, G3, ER+, PR+, HER2-) - Signed by Nicholas Lose, MD on 03/04/2019     Neo-Adjuvant Chemotherapy   Adriamycin and Cytoxan x 4 given every three weeks without Neulasta support., followed by weekly Taxol x 12 versus Taxotere Cytoxan after her delivery   04/15/2019 Genetic Testing   Negative genetic testing:  No pathogenic variants detected on the [employer] Breast Cancer Guidelines-Based Panel or the Common Hereditary Cancers Panel. The report date is 04/15/2019.  The Breast Cancer Guidelines-Based panel offered by [facility] includes sequencing and rearrangement analysis for the following 11 genes:  ATM, BRCA1, BRCA2, CDH1, CHEK2, NBN, NF1, PALB2, PTEN, STK11 and TP53.  The Common Hereditary Cancers Panel offered by [facility] includes sequencing and/or deletion duplication testing of  the following 48 genes: APC, ATM, AXIN2, BARD1, BMPR1A, BRCA1, BRCA2, BRIP1, CDH1, CDK4, CDKN2A (p14ARF), CDKN2A (p16INK4a), CHEK2, CTNNA1, DICER1, EPCAM (Deletion/duplication testing only), GREM1 (promoter region deletion/duplication testing only), KIT, MEN1, MLH1, MSH2, MSH3, MSH6, MUTYH, NBN, NF1, NHTL1, PALB2, PDGFRA, PMS2, POLD1, POLE, PTEN, RAD50, RAD51C, RAD51D, RNF43, SDHB, SDHC, SDHD, SMAD4, SMARCA4. STK11, TP53, TSC1, TSC2, and VHL.  The following genes were evaluated for sequence changes only: SDHA and HOXB13 c.251G>A variant only.    10/08/2019 Surgery   Bilateral mastectomies [name]):  Right mastectomy: Multifocal grade 2 IDC 0.8 cm and 0.6 cm with high-grade DCIS, margins negative, 0/4 lymph nodes ER 95%, PR 5%, Ki-67 10%, HER-2 negative;  Left mastectomy: IDC 0.6 cm, LVI, margins negative, 1/4 lymph nodes positive, ER 60%, PR 60%, HER-2 negative, Ki-67 50% RCB class II   11/30/2019 - 01/08/2020 Radiation Therapy   Adjuvant radiation    Anti-estrogen oral therapy   Ibrance Letrozole, Zolodex, Xgeva     CHIEF COMPLIANT: Follow-up of metastatic breast cancer  INTERVAL HISTORY: Melissa [name] is a 30 y.o. with above-mentioned history of metastatic breast cancer with osseous metastases who is currently on treatment with Faslodex, letrozole, Ibrance plus Zoladex along with Xgeva. She presents to the clinic today for follow-up.  Complaining of right upper quadrant abdominal pain.  She is also got nausea.  She was given oxycodone which appears to be helping her slightly.  Initially she thought this was related to her profound cough causing some discomfort in the abdomen.  ALLERGIES:  has No Known Allergies.  MEDICATIONS:  Current Outpatient Medications  Medication Sig Dispense Refill   anastrozole (ARIMIDEX) 1 MG tablet TAKE 1  TABLET(1 MG) BY MOUTH DAILY 90 tablet 0   palbociclib (IBRANCE) 100 MG tablet TAKE 1 TABLET BY MOUTH DAILY. TAKE FOR 21 DAYS ON, 7 DAYS OFF, REPEAT EVERY 28  DAYS. 21 tablet 6   No current facility-administered medications for this visit.    PHYSICAL EXAMINATION: ECOG PERFORMANCE STATUS: 1 - Symptomatic but completely ambulatory  Vitals:   02/09/21 1034  BP: 105/74  Pulse: 97  Resp: 16  Temp: (!) 97.5 F (36.4 C)  SpO2: 100%   Filed Weights   02/09/21 1034  Weight: 175 lb 11.2 oz (79.7 kg)      LABORATORY DATA:  I have reviewed the data as listed CMP Latest Ref Rng & Units 12/05/2020 11/04/2020 09/05/2020  Glucose 70 - 99 mg/dL 86 69(L) 92  BUN 6 - 20 mg/dL $Remove'9 13 14  'rSeEXCg$ Creatinine 0.44 - 1.00 mg/dL 0.94 1.02(H) 0.75  Sodium 135 - 145 mmol/L 141 141 141  Potassium 3.5 - 5.1 mmol/L 3.8 4.1 4.2  Chloride 98 - 111 mmol/L 106 106 105  CO2 22 - 32 mmol/L $RemoveB'26 26 28  'uqupsVpO$ Calcium 8.9 - 10.3 mg/dL 9.2 9.4 9.0  Total Protein 6.5 - 8.1 g/dL 7.9 7.6 7.2  Total Bilirubin 0.3 - 1.2 mg/dL 0.5 0.4 0.4  Alkaline Phos 38 - 126 U/L 61 54 46  AST 15 - 41 U/L 43(H) 22 17  ALT 0 - 44 U/L $Remo'28 20 13    'opGVT$ Lab Results  Component Value Date   WBC 3.0 (L) 12/05/2020   HGB 12.4 12/05/2020   HCT 36.4 12/05/2020   MCV 100.3 (H) 12/05/2020   PLT 218 12/05/2020   NEUTROABS 1.8 12/05/2020    ASSESSMENT & PLAN:  Malignant neoplasm of overlapping sites of left breast in female, estrogen receptor positive ([place]) 02/26/2019:Pregnant lady with 72-month history of left breast swelling and skin thickening, mammogram showed pleomorphic calcifications 8 cm, axilla lymph node biopsy positive ER 60%, PR 60%, Ki-67 50%, HER-2 negative ratio 1.25, anterior and posterior end of the calcifications biopsied: 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  Neoadjuvant chemotherapy with dose dense Adriamycin and Cytoxan given every 3 weeks without Neulasta support given her pregnancy  completed 05/28/2019 2.  Post-Partum weekly Taxol x12 starting 07/03/2019 3.  Bilateral mastectomy 10/08/2019: Right mastectomy: Multifocal grade 2 IDC 0.8 cm and 0.6 cm with high-grade DCIS, margins negative, 0/4 lymph nodes ER 95%, PR 5%, Ki-67 10%, HER-2 negative; left mastectomy: IDC 0.6 cm, LVI, margins negative, 1/4 lymph nodes positive, ER 60%, PR 60%, HER-2 negative, Ki-67 50% RCB class II 4.  Adjuvant radiation therapy 5.  Followed by adjuvant antiestrogen therapy with complete estrogen blockade (ovarian suppression with AI) CT C/A/P on 5/13 shows sclerotic bone lesions, bone scan negative 6.  Declined CDK 4 and 6 inhibitor abemaciclib with antiestrogen therapy ---------------------------------------------------------------------------------------------------------------------------------------------------- Bone scan 04/01/2020: Interval development of multifocal abnormal areas of increased tracer uptake compatible with diffuse bone metastases bony pelvis, right acetabulum, left pubic bone, anterior left lower rib, thoracic and upper lumbar spine including T6, T9, L1, intertrochanteric region of right femur   03/31/2020: CT CAP: Bone metastases.  No visceral mets.   Current treatment plan: Faslodex + letrozole + Ibrance plus Zoladex along with Brock Bad Toxicities: Neutropenia: Stable white blood cell count on 100 mg of  Ibrance.  She is tolerating it very well.   07/15/20: CT CAP: Previous bone lesions show sclerotic changes suggesting healing.  Interval increase in size of small right axillary lymph nodes.  No evidence of any distant soft tissue mets 07/15/2020: Bone scan: Response in the bones is noted.   Bone metastases: Currently on Xgeva 02/07/2021: Abdominal pain: Ultrasound abdomen: Heterogeneous hepatic parenchyma diffuse hypoechoic multiple 1 cm masses throughout the liver concerning for metastatic disease to the liver 02/07/2021: CT abdomen pelvis: Development of innumerable 1  cm small low-density foci throughout the liver consistent with metastatic disease  Plan: Consider biopsying the liver to run prognostic panel as well as molecular testing We may have to consider changing treatment to Enhertu (HER2 1+ by IHC on the mastectomy specimen) other options of systemic chemotherapy like Xeloda, Trodelvy etc.   Elevated liver enzymes: Labs done at [facility]: AST 451, ALT 170, alk phos 341, total bilirubin 4 We will repeat these labs today to verify if they are correct.  We will also get direct bilirubin level. PET CT scan is being planned.  She will need a biopsy of the liver subsequently. Because occasionally Ibrance can cause elevation of liver enzymes, I instructed her to stop taking Ibrance at this time.   No orders of the defined types were placed in this encounter.  The patient has a good understanding of the overall plan. she agrees with it. she will call with any problems that may develop before the next visit here.  Total time spent: 30 mins including face to face time and time spent for planning, charting and coordination of care  Rulon Eisenmenger, MD, MPH 02/09/2021  I, Thana Ates, am acting as scribe for Dr. Nicholas Lose.  I have reviewed the above documentation for accuracy and completeness, and I agree with the above.   Addendum: CMP Latest Ref Rng & Units 02/09/2021 12/05/2020 11/04/2020  Glucose 70 - 99 mg/dL 85 86 69(L)  BUN 6 - 20 mg/dL $Remove'11 9 13  'iIgPpfk$ Creatinine 0.44 - 1.00 mg/dL 1.19(H) 0.94 1.02(H)  Sodium 135 - 145 mmol/L 137 141 141  Potassium 3.5 - 5.1 mmol/L 3.8 3.8 4.1  Chloride 98 - 111 mmol/L 100 106 106  CO2 22 - 32 mmol/L $RemoveB'25 26 26  'eGtVOREi$ Calcium 8.9 - 10.3 mg/dL 8.8(L) 9.2 9.4  Total Protein 6.5 - 8.1 g/dL 7.8 7.9 7.6  Total Bilirubin 0.3 - 1.2 mg/dL 5.0(HH) 0.5 0.4  Alkaline Phos 38 - 126 U/L 363(H) 61 54  AST 15 - 41 U/L 336(HH) 43(H) 22  ALT 0 - 44 U/L 151(H) 28 20   I reviewed the liver function test results.  Her bilirubin  has gone up to 5 and therefore I recommended that she get admitted to the hospital.  She will go to the emergency room and be checked in.  She will definitely need a liver MRI and a gastroenterology consultation to see if there is a possibility of stent placement.  Her direct bilirubin is elevated to 3.

## 2021-02-09 ENCOUNTER — Observation Stay (HOSPITAL_COMMUNITY): Payer: Medicaid Other

## 2021-02-09 ENCOUNTER — Inpatient Hospital Stay: Payer: Medicaid Other

## 2021-02-09 ENCOUNTER — Inpatient Hospital Stay (HOSPITAL_COMMUNITY)
Admission: EM | Admit: 2021-02-09 | Discharge: 2021-02-11 | DRG: 436 | Disposition: A | Discharge status: Home / Self Care | Payer: Medicaid Other | Attending: Internal Medicine | Admitting: Internal Medicine

## 2021-02-09 ENCOUNTER — Other Ambulatory Visit: Payer: Self-pay

## 2021-02-09 ENCOUNTER — Encounter: Payer: Self-pay | Admitting: *Deleted

## 2021-02-09 ENCOUNTER — Encounter (HOSPITAL_COMMUNITY): Payer: Self-pay

## 2021-02-09 ENCOUNTER — Inpatient Hospital Stay: Payer: Medicaid Other | Attending: Hematology and Oncology | Admitting: Hematology and Oncology

## 2021-02-09 ENCOUNTER — Other Ambulatory Visit: Payer: Self-pay | Admitting: *Deleted

## 2021-02-09 DIAGNOSIS — I509 Heart failure, unspecified: Secondary | ICD-10-CM | POA: Diagnosis present

## 2021-02-09 DIAGNOSIS — C7951 Secondary malignant neoplasm of bone: Secondary | ICD-10-CM | POA: Diagnosis present

## 2021-02-09 DIAGNOSIS — Z170 Estrogen receptor positive status [ER+]: Secondary | ICD-10-CM | POA: Insufficient documentation

## 2021-02-09 DIAGNOSIS — Z9013 Acquired absence of bilateral breasts and nipples: Secondary | ICD-10-CM

## 2021-02-09 DIAGNOSIS — R1011 Right upper quadrant pain: Secondary | ICD-10-CM

## 2021-02-09 DIAGNOSIS — Z87891 Personal history of nicotine dependence: Secondary | ICD-10-CM

## 2021-02-09 DIAGNOSIS — R748 Abnormal levels of other serum enzymes: Secondary | ICD-10-CM

## 2021-02-09 DIAGNOSIS — Z20822 Contact with and (suspected) exposure to covid-19: Secondary | ICD-10-CM | POA: Diagnosis present

## 2021-02-09 DIAGNOSIS — C50812 Malignant neoplasm of overlapping sites of left female breast: Secondary | ICD-10-CM | POA: Insufficient documentation

## 2021-02-09 DIAGNOSIS — Z95828 Presence of other vascular implants and grafts: Secondary | ICD-10-CM

## 2021-02-09 DIAGNOSIS — Z823 Family history of stroke: Secondary | ICD-10-CM

## 2021-02-09 DIAGNOSIS — Z800 Family history of malignant neoplasm of digestive organs: Secondary | ICD-10-CM

## 2021-02-09 DIAGNOSIS — Z853 Personal history of malignant neoplasm of breast: Secondary | ICD-10-CM

## 2021-02-09 DIAGNOSIS — R1084 Generalized abdominal pain: Secondary | ICD-10-CM

## 2021-02-09 DIAGNOSIS — C228 Malignant neoplasm of liver, primary, unspecified as to type: Secondary | ICD-10-CM

## 2021-02-09 DIAGNOSIS — R109 Unspecified abdominal pain: Secondary | ICD-10-CM | POA: Diagnosis present

## 2021-02-09 DIAGNOSIS — R7989 Other specified abnormal findings of blood chemistry: Secondary | ICD-10-CM

## 2021-02-09 DIAGNOSIS — E669 Obesity, unspecified: Secondary | ICD-10-CM | POA: Diagnosis present

## 2021-02-09 DIAGNOSIS — Z6832 Body mass index (BMI) 32.0-32.9, adult: Secondary | ICD-10-CM

## 2021-02-09 DIAGNOSIS — N179 Acute kidney failure, unspecified: Secondary | ICD-10-CM | POA: Diagnosis present

## 2021-02-09 DIAGNOSIS — D61818 Other pancytopenia: Secondary | ICD-10-CM | POA: Diagnosis present

## 2021-02-09 DIAGNOSIS — C787 Secondary malignant neoplasm of liver and intrahepatic bile duct: Secondary | ICD-10-CM | POA: Diagnosis not present

## 2021-02-09 DIAGNOSIS — Z8249 Family history of ischemic heart disease and other diseases of the circulatory system: Secondary | ICD-10-CM

## 2021-02-09 DIAGNOSIS — C50912 Malignant neoplasm of unspecified site of left female breast: Secondary | ICD-10-CM

## 2021-02-09 DIAGNOSIS — Z803 Family history of malignant neoplasm of breast: Secondary | ICD-10-CM

## 2021-02-09 DIAGNOSIS — Z808 Family history of malignant neoplasm of other organs or systems: Secondary | ICD-10-CM

## 2021-02-09 DIAGNOSIS — Z79899 Other long term (current) drug therapy: Secondary | ICD-10-CM

## 2021-02-09 LAB — CBC WITH DIFFERENTIAL (CANCER CENTER ONLY)
Abs Immature Granulocytes: 0.06 10*3/uL (ref 0.00–0.07)
Basophils Absolute: 0 10*3/uL (ref 0.0–0.1)
Basophils Relative: 1 %
Eosinophils Absolute: 0 10*3/uL (ref 0.0–0.5)
Eosinophils Relative: 1 %
HCT: 36.7 % (ref 36.0–46.0)
Hemoglobin: 12.4 g/dL (ref 12.0–15.0)
Immature Granulocytes: 2 %
Lymphocytes Relative: 42 %
Lymphs Abs: 1.2 10*3/uL (ref 0.7–4.0)
MCH: 33.3 pg (ref 26.0–34.0)
MCHC: 33.8 g/dL (ref 30.0–36.0)
MCV: 98.7 fL (ref 80.0–100.0)
Monocytes Absolute: 0.2 10*3/uL (ref 0.1–1.0)
Monocytes Relative: 9 %
Neutro Abs: 1.3 10*3/uL — ABNORMAL LOW (ref 1.7–7.7)
Neutrophils Relative %: 45 %
Platelet Count: 97 10*3/uL — ABNORMAL LOW (ref 150–400)
RBC: 3.72 MIL/uL — ABNORMAL LOW (ref 3.87–5.11)
RDW: 15.9 % — ABNORMAL HIGH (ref 11.5–15.5)
Smear Review: NORMAL
WBC Count: 2.8 10*3/uL — ABNORMAL LOW (ref 4.0–10.5)
nRBC: 15.1 % — ABNORMAL HIGH (ref 0.0–0.2)

## 2021-02-09 LAB — CMP (CANCER CENTER ONLY)
ALT: 151 U/L — ABNORMAL HIGH (ref 0–44)
AST: 336 U/L (ref 15–41)
Albumin: 3.4 g/dL — ABNORMAL LOW (ref 3.5–5.0)
Alkaline Phosphatase: 363 U/L — ABNORMAL HIGH (ref 38–126)
Anion gap: 12 (ref 5–15)
BUN: 11 mg/dL (ref 6–20)
CO2: 25 mmol/L (ref 22–32)
Calcium: 8.8 mg/dL — ABNORMAL LOW (ref 8.9–10.3)
Chloride: 100 mmol/L (ref 98–111)
Creatinine: 1.19 mg/dL — ABNORMAL HIGH (ref 0.44–1.00)
GFR, Estimated: >60 mL/min (ref >60)
Glucose, Bld: 85 mg/dL (ref 70–99)
Potassium: 3.8 mmol/L (ref 3.5–5.1)
Sodium: 137 mmol/L (ref 135–145)
Total Bilirubin: 5 mg/dL (ref 0.3–1.2)
Total Protein: 7.8 g/dL (ref 6.5–8.1)

## 2021-02-09 LAB — BILIRUBIN, DIRECT: Bilirubin, Direct: 3.1 mg/dL — ABNORMAL HIGH (ref 0.0–0.2)

## 2021-02-09 LAB — RESP PANEL BY RT-PCR (FLU A&B, COVID) ARPGX2
Influenza A by PCR: NEGATIVE
Influenza B by PCR: NEGATIVE
SARS Coronavirus 2 by RT PCR: NEGATIVE

## 2021-02-09 IMAGING — MR MR ABDOMEN WO/W CM MRCP
16 of 20 series · 38 of 48 positions shown · IV contrast (gadavist)
Comparison: [REDACTED] CT of [DATE].

CLINICAL DATA: History of breast cancer.  Jaundice.

EXAM:
MRI ABDOMEN WITHOUT AND WITH CONTRAST (INCLUDING MRCP)
TECHNIQUE: Multiplanar multisequence MR imaging of the abdomen was performed
both before and after the administration of intravenous contrast.
Heavily T2-weighted images of the biliary and pancreatic ducts were
obtained, and three-dimensional MRCP images were rendered by post
processing.
CONTRAST:  8.5mL GADAVIST GADOBUTROL 1 MMOL/ML IV SOLN

[Series 3: T2 fat-sat · axial · 6.0mm · 1.14mm/px · 1 of 36 slices shown]
[im 1/36]
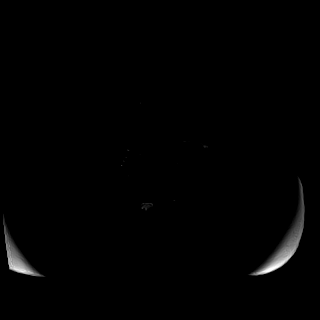

[Series 5: T2 · coronal · 6.0mm · 1.56mm/px · 1 of 30 slices shown (1 of 2)]
[im 1/30]
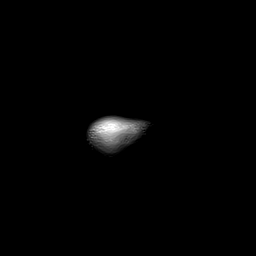

[Series 7: DWI · axial · 6.0mm · 1.36mm/px · z∈[-89,+194]mm · 3 of 72 slices shown (1 of 2)]
[im 1/72]
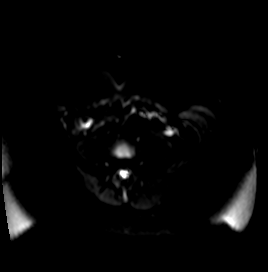
[im 36/72]
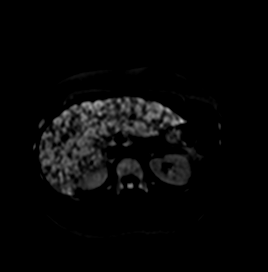
[im 72/72]
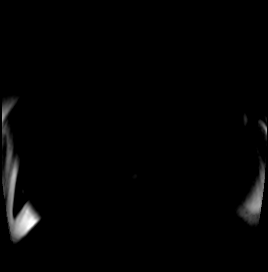

[Series 8: DWI · axial · 6.0mm · 1.36mm/px · 1 of 36 slices shown (2 of 2)]
[im 1/36]
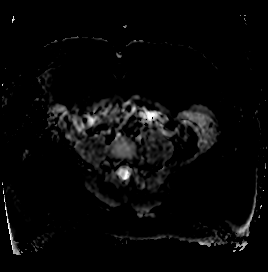

[Series 9: T1 · axial · 3.8mm · 1.12mm/px · z∈[-81,+189]mm · 3 of 72 slices shown (1 of 2)]
[im 1/72]
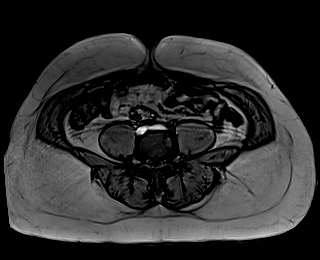
[im 36/72]
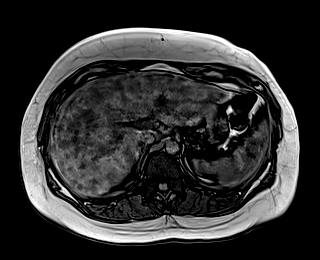
[im 72/72]
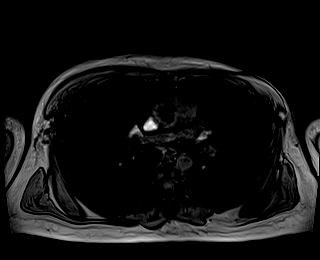

[Series 10: T1 · axial · 3.8mm · 1.12mm/px · z∈[-81,+189]mm · 3 of 72 slices shown (2 of 2)]
[im 1/72]
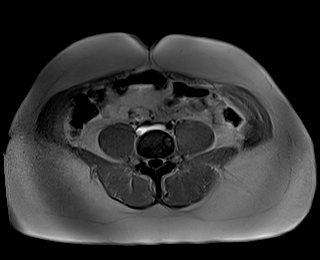
[im 36/72]
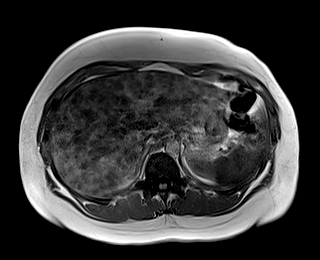
[im 72/72]
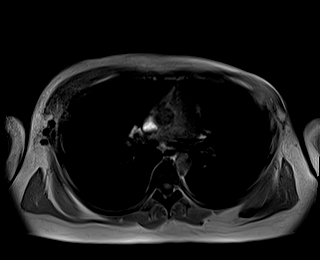

[Series 11: cor obl thk · sagittal · 50.0mm · 0.78mm/px · 1 of 9 slices shown]
[im 1/9]
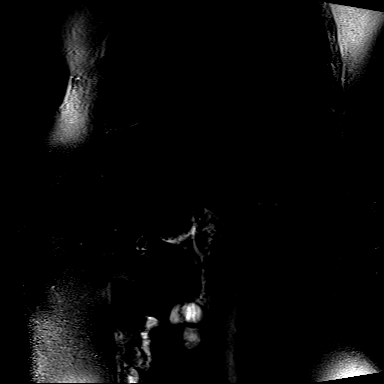

[Series 13: cor_3d_spc_trig · coronal · 1.0mm · 0.49mm/px · 3 of 72 slices shown]
[im 1/72]
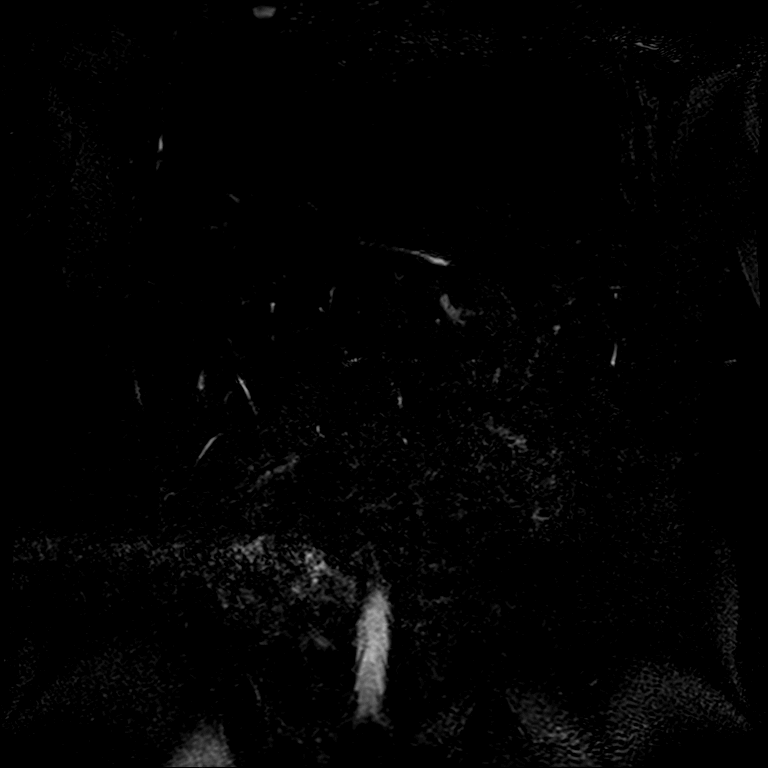
[im 36/72]
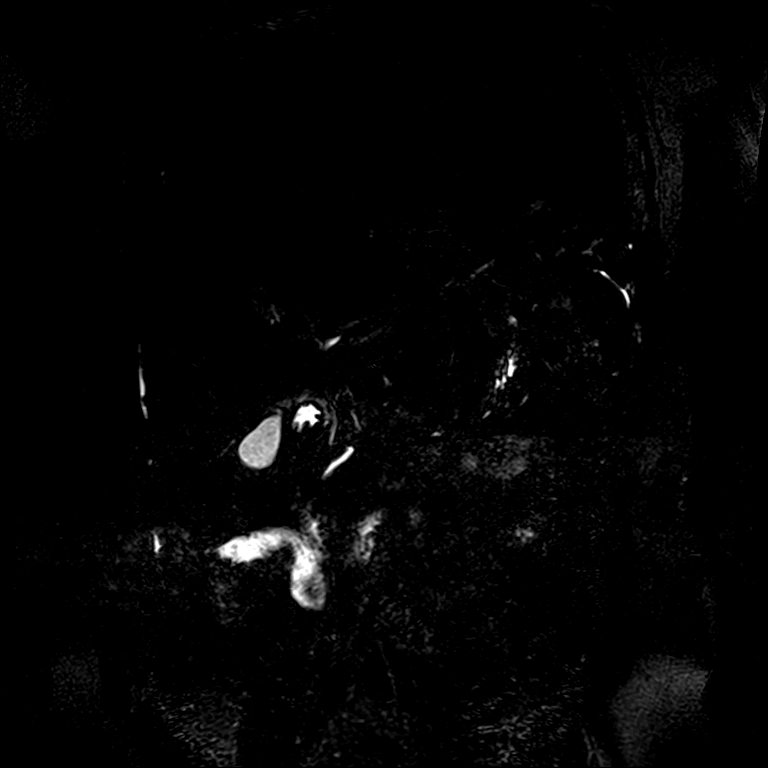
[im 72/72]
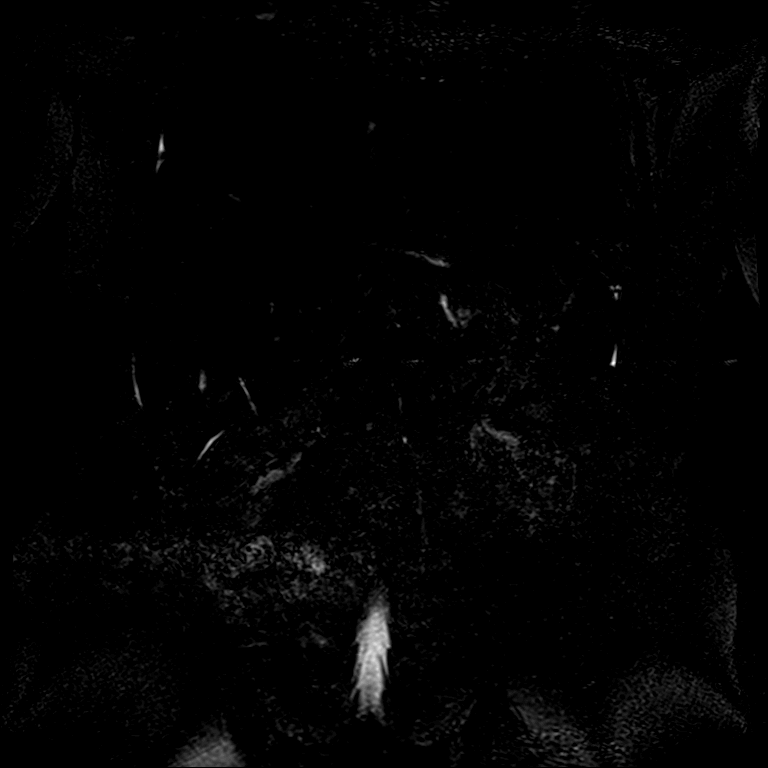

[Series 15: T2 · axial · 6.5mm · 1.41mm/px · 1 of 30 slices shown (2 of 2)]
[im 1/30]
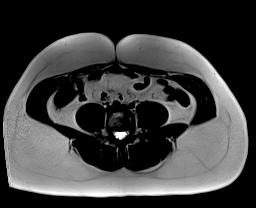

[Series 17: T1 dynamic · axial · 3.0mm · 1.12mm/px · z∈[-76,+185]mm · 3 of 88 slices shown (1 of 7)]
[im 1/88]
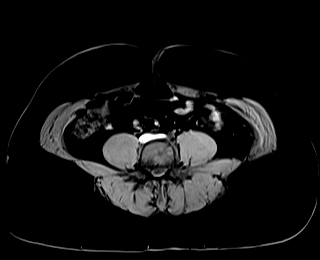
[im 44/88]
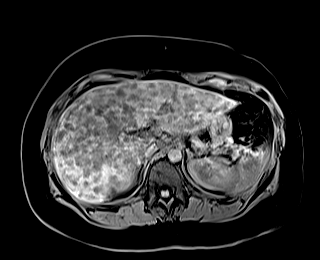
[im 88/88]
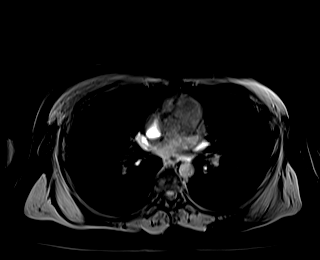

[Series 21: T1 dynamic · axial · 3.0mm · 1.12mm/px · z∈[-76,+185]mm · 3 of 88 slices shown (2 of 7)]
[im 1/88]
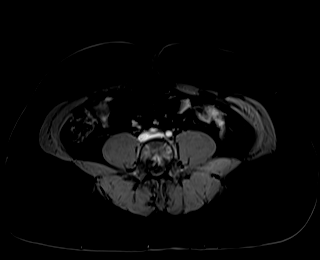
[im 44/88]
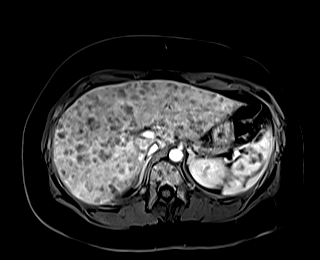
[im 88/88]
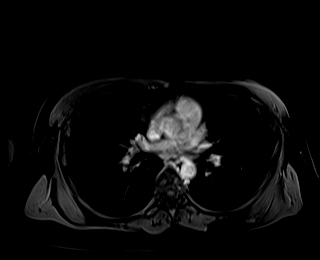

[Series 22: T1 dynamic · axial · 3.0mm · 1.12mm/px · z∈[-76,+185]mm · 3 of 88 slices shown (3 of 7)]
[im 1/88]
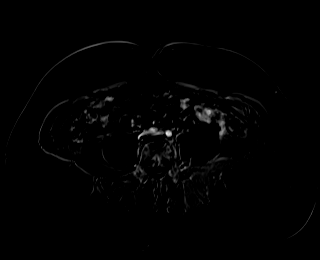
[im 44/88]
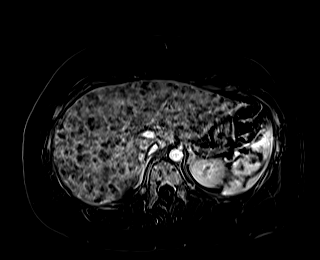
[im 88/88]
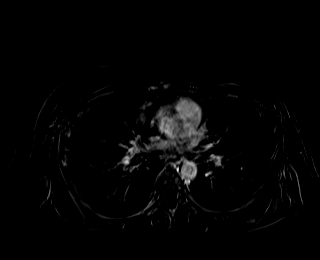

[Series 25: T1 dynamic · axial · 3.0mm · 1.12mm/px · z∈[-76,+185]mm · 3 of 88 slices shown (4 of 7)]
[im 1/88]
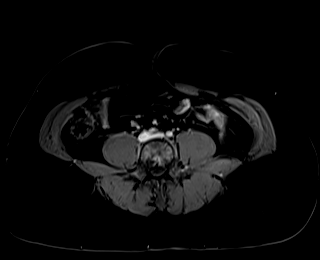
[im 44/88]
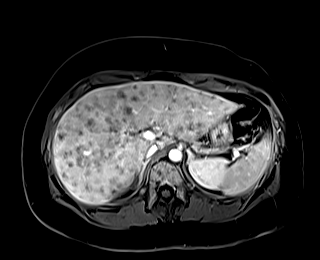
[im 88/88]
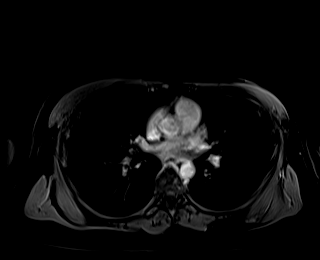

[Series 26: T1 dynamic · axial · 3.0mm · 1.12mm/px · z∈[-76,+185]mm · 3 of 88 slices shown (5 of 7)]
[im 1/88]
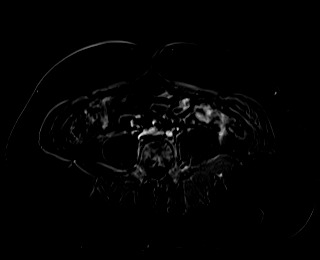
[im 44/88]
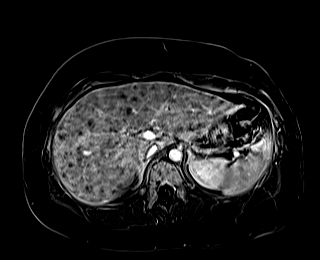
[im 88/88]
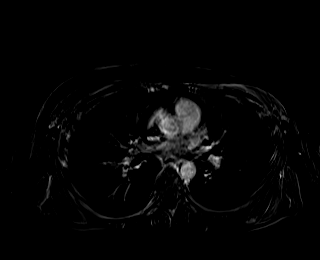

[Series 29: T1 dynamic · axial · 3.0mm · 1.12mm/px · z∈[-76,+185]mm · 3 of 88 slices shown (6 of 7)]
[im 1/88]
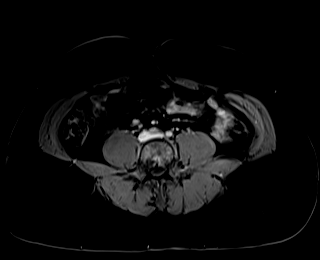
[im 44/88]
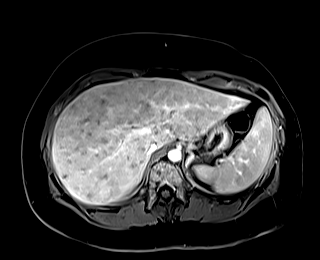
[im 88/88]
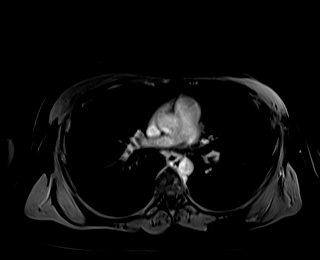

[Series 30: T1 dynamic · axial · 3.0mm · 1.12mm/px · z∈[-76,+185]mm · 3 of 88 slices shown (7 of 7)]
[im 1/88]
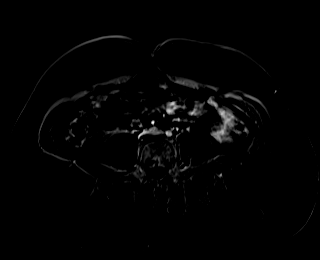
[im 44/88]
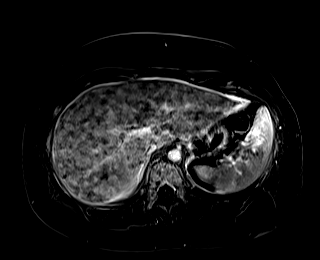
[im 88/88]
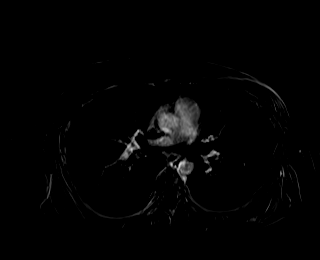

[38 of 48 positions shown; findings below may reference images not displayed]

FINDINGS: Lower chest: Normal heart size without pericardial or pleural
effusion.

Hepatobiliary: Diffuse hepatic metastasis, with lesions replacing
nearly the entire hepatic parenchyma on the order of 1.3 cm.

Normal gallbladder. No intra or extrahepatic biliary duct
dilatation. No obstructive stone or mass.

Pancreas:  Normal, without mass or ductal dilatation.

Spleen:  Normal in size, without focal abnormality.

Adrenals/Urinary Tract: Normal adrenal glands. Too small to
characterize lesions in both kidneys. No hydronephrosis.

Stomach/Bowel: Normal stomach and abdominal bowel loops.

Vascular/Lymphatic: Normal caliber of the aorta and branch vessels.
No retroperitoneal or retrocrural adenopathy.

Other:  No ascites.  No evidence of omental or peritoneal disease.

Musculoskeletal: Diffuse osseous metastasis, as have been detailed
previously.
IMPRESSION: 1. Since [DATE], development of diffuse hepatic metastasis,
nearly completely replacing the hepatic parenchyma.
2. No biliary duct dilatation or alternate explanation for jaundice.
3. Diffuse osseous metastasis, as detailed previously.

## 2021-02-09 MED ORDER — OXYCODONE HCL 5 MG PO TABS: 5.0000 mg | ORAL_TABLET | ORAL | 0 refills | Status: DC | PRN

## 2021-02-09 MED ORDER — LORAZEPAM 2 MG/ML IJ SOLN: 0.5000 mg | Freq: Once | INTRAMUSCULAR | Status: DC

## 2021-02-09 MED ORDER — POLYETHYLENE GLYCOL 3350 17 G PO PACK: 17.0000 g | PACK | Freq: Every day | ORAL | Status: DC | PRN

## 2021-02-09 MED ORDER — ONDANSETRON 4 MG PO TBDP: 4.0000 mg | ORAL_TABLET | Freq: Three times a day (TID) | ORAL | 3 refills | Status: DC | PRN

## 2021-02-09 MED ORDER — GADOBUTROL 1 MMOL/ML IV SOLN
8.5000 mL | Freq: Once | INTRAVENOUS | Status: AC | PRN
  Administered 2021-02-09: 8.5 mL via INTRAVENOUS

## 2021-02-09 MED ORDER — ONDANSETRON HCL 4 MG/2ML IJ SOLN
4.0000 mg | Freq: Four times a day (QID) | INTRAMUSCULAR | Status: DC | PRN
  Administered 2021-02-09 – 2021-02-10 (×2): 4 mg via INTRAVENOUS
  Filled 2021-02-09 (×2): qty 2

## 2021-02-09 MED ORDER — ONDANSETRON HCL 4 MG/2ML IJ SOLN
4.0000 mg | Freq: Once | INTRAMUSCULAR | Status: AC
  Administered 2021-02-09: 4 mg via INTRAVENOUS

## 2021-02-09 MED ORDER — LORAZEPAM 0.5 MG PO TABS: 0.5000 mg | ORAL_TABLET | Freq: Once | ORAL | Status: DC

## 2021-02-09 MED ORDER — SODIUM CHLORIDE 0.9 % IV SOLN: INTRAVENOUS | Status: DC

## 2021-02-09 MED ORDER — PALBOCICLIB 100 MG PO TABS: 100.0000 mg | ORAL_TABLET | Freq: Every day | ORAL | Status: DC

## 2021-02-09 MED ORDER — OXYCODONE HCL 5 MG PO TABS
5.0000 mg | ORAL_TABLET | ORAL | Status: DC | PRN
  Administered 2021-02-09 – 2021-02-11 (×8): 5 mg via ORAL
  Filled 2021-02-09 (×8): qty 1

## 2021-02-09 MED ORDER — ANASTROZOLE 1 MG PO TABS
1.0000 mg | ORAL_TABLET | Freq: Every day | ORAL | Status: DC
  Administered 2021-02-10 – 2021-02-11 (×2): 1 mg via ORAL
  Filled 2021-02-09 (×2): qty 1

## 2021-02-09 MED ORDER — MORPHINE SULFATE (PF) 4 MG/ML IV SOLN
4.0000 mg | Freq: Once | INTRAVENOUS | Status: AC
  Administered 2021-02-09: 4 mg via INTRAVENOUS

## 2021-02-09 MED ORDER — GOSERELIN ACETATE 3.6 MG ~~LOC~~ IMPL
3.6000 mg | DRUG_IMPLANT | Freq: Once | SUBCUTANEOUS | Status: AC
  Administered 2021-02-09: 3.6 mg via SUBCUTANEOUS
  Filled 2021-02-09: qty 3.6

## 2021-02-09 MED ORDER — ENOXAPARIN SODIUM 40 MG/0.4ML IJ SOSY
40.0000 mg | PREFILLED_SYRINGE | INTRAMUSCULAR | Status: DC
  Administered 2021-02-09: 40 mg via SUBCUTANEOUS
  Filled 2021-02-09: qty 0.4

## 2021-02-09 MED ORDER — DENOSUMAB 120 MG/1.7ML ~~LOC~~ SOLN
120.0000 mg | Freq: Once | SUBCUTANEOUS | Status: AC
  Administered 2021-02-09: 120 mg via SUBCUTANEOUS
  Filled 2021-02-09: qty 1.7

## 2021-02-09 MED ORDER — MORPHINE SULFATE (PF) 2 MG/ML IV SOLN
2.0000 mg | INTRAVENOUS | Status: DC | PRN
  Administered 2021-02-09 – 2021-02-11 (×7): 2 mg via INTRAVENOUS
  Filled 2021-02-09 (×7): qty 1

## 2021-02-09 MED ORDER — LORAZEPAM 2 MG/ML IJ SOLN
1.0000 mg | Freq: Once | INTRAMUSCULAR | Status: AC
  Administered 2021-02-09: 1 mg via INTRAVENOUS
  Filled 2021-02-09: qty 1

## 2021-02-09 NOTE — ED Triage Notes (Signed)
Pt states she was sent over here for an abnormal liver lab, pt does not know what lab or value or lab. Dr. Lindi Adie is patients doctor. Pt c/o right side pain.

## 2021-02-09 NOTE — Plan of Care (Signed)
  Problem: Activity: Goal: Risk for activity intolerance will decrease Outcome: Progressing   Problem: Nutrition: Goal: Adequate nutrition will be maintained Outcome: Progressing   Problem: Safety: Goal: Ability to remain free from injury will improve Outcome: Progressing   Problem: Pain Managment: Goal: General experience of comfort will improve Outcome: Progressing   

## 2021-02-09 NOTE — H&P (Signed)
History and Physical    Melissa [name] WNU:[id number] DOB: [birth year] DOA: 02/09/2021  PCP: [name], NP   Patient coming from: Home  Chief Complaint: Abnormal labs.  Right-sided abdominal pain  HPI: Melissa [name] is a 30 y.o. 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  She has history of left-sided breast cancer, follows with oncology.  She is status post bilateral mastectomies on 2021, radiation therapy and is currently on antiestrogen oral therapy: Letrozole, Faslodex, Ibrance plus Zoladex along with Xgeva.  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  She presented to the oncology clinic today with complaints of right upper quadrant abdominal pain.  Ultrasound of the abdomen done on 11/29 showed heterogeneous hepatic parenchyma with diffuse hyperechoic multiple masses throughout the liver concerning for metastatic disease to the liver .CT abdomen/pelvis showed hepatic metastasis as well.  She was also nauseous.  She was also coughing.  No report of fever. She was recommended admission by oncology for management of the abdominal pain, MRCP of the liver. Patient seen and examined at the bedside this afternoon in the emergency department.  Currently she is hemodynamically stable.  She looked comfortable.  She denies any fever, chills, chest pain, shortness of breath, nausea, vomiting, diarrhea or dysuria  ED Course: Patient remained hemodynamically stable in the emergency department.  Lab work showed creatinine of 1.19, elevated AST and ALT, elevated bilirubin of 5, pancytopenia picture.   MRCP ordered.  Case was discussed with GI who recommended to recall if MRCP shows abnormality.  Review of Systems: As per HPI otherwise 10 point review of systems negative.    Past Medical History:  Diagnosis Date   Abnormal Pap smear 07/13/11   ASCUS +HPV   Anemia    CHF (congestive heart failure) ([place])    Family history of breast cancer    Family history of colon cancer    Family history of throat cancer    Fracture of distal phalanx of finger 10/05/2019   Right thumb and middle finger   Herpes simplex without mention of complication    dx age 2   left breast ca dx'd 02/26/19   breast cancer    Past Surgical History:  Procedure Laterality Date   BREAST BIOPSY Left 03/18/2019   Procedure: LEFT BREAST SKIN PUNCH BIOPSY;  Surgeon: Rolm Bookbinder, MD;  Location: [facility];  Service: General;  Laterality: Left;   COLPOSCOPY     CIN I ECC -   MASTECTOMY Bilateral 10/08/2019   BILATERAL MASTECTOMY WITH LEFT AXILLARY SENTINEL LYMPH NODE BIOPSY (Bilateral Breast)    MASTECTOMY W/ SENTINEL NODE BIOPSY Bilateral 10/08/2019   Procedure: BILATERAL MASTECTOMY WITH LEFT AXILLARY SENTINEL LYMPH NODE BIOPSY;  Surgeon: Rolm Bookbinder, MD;  Location: [facility];  Service: General;  Laterality: Bilateral;  BILATERAL PEC BLOCK   NO PAST SURGERIES     PORTACATH PLACEMENT N/A 03/18/2019   Procedure: INSERTION PORT-A-CATH WITH ULTRASOUND GUIDANCE;  Surgeon: Rolm Bookbinder, MD;  Location: [facility];  Service: General;  Laterality: N/A;   RADIOACTIVE SEED GUIDED AXILLARY SENTINEL LYMPH NODE Left 10/08/2019   Procedure: LEFT RADIOACTIVE SEED GUIDED AXILLARY SENTINEL LYMPH NODE EXCISION;  Surgeon: [name],  Rodman Key, MD;  Location: [facility];  Service: General;  Laterality: Left;  PEC BLOCK     reports that she quit smoking about 1 years ago. Her smoking use included cigarettes. She has never used smokeless tobacco. She reports that she does not currently use alcohol. She reports current drug use. Frequency: 2.00  times per week. Drug: Marijuana.  No Known Allergies  Family History  Problem Relation Age of Onset   Breast cancer Paternal Grandmother        dx. 70s or younger   Stroke Father 40   Hypertension Father    Cancer Maternal Aunt        unknown type, dx. late 28s   Colon cancer Paternal Uncle        dx 2s   Throat cancer Maternal Grandmother        dx. 18s   Cancer Paternal Uncle        unknown type, dx. 52s   Breast cancer Cousin        dx. 86s, paternal 1st cousin     Prior to Admission medications   Medication Sig Start Date End Date Taking? Authorizing Provider  anastrozole (ARIMIDEX) 1 MG tablet TAKE 1 TABLET(1 MG) BY MOUTH DAILY Patient taking differently: Take 1 mg by mouth daily. 01/16/21  Yes Nicholas Lose, MD  oxyCODONE (OXY IR/ROXICODONE) 5 MG immediate release tablet Take 1 tablet (5 mg total) by mouth every 4 (four) hours as needed for severe pain. 02/09/21  Yes Nicholas Lose, MD  palbociclib (IBRANCE) 100 MG tablet TAKE 1 TABLET BY MOUTH DAILY. TAKE FOR 21 DAYS ON, 7 DAYS OFF, REPEAT EVERY 28 DAYS. Patient taking differently: 100 mg as directed. 11/22/20 11/22/21 Yes Nicholas Lose, MD  ondansetron (ZOFRAN-ODT) 4 MG disintegrating tablet Take 1 tablet (4 mg total) by mouth every 8 (eight) hours as needed for nausea or vomiting. 02/09/21   Nicholas Lose, MD    Physical Exam: Vitals:   02/09/21 1304 02/09/21 1451  BP: 118/60 131/89  Pulse: 90 90  Resp: 18 18  Temp: 98 F (36.7 C) 98.9 F (37.2 C)  TempSrc: Oral   SpO2: 99% 100%  Weight:  79.7 kg  Height:  5\' 2"  (1.575 m)    Constitutional: pleasant female, calm, comfortable,obese Vitals:   02/09/21 1304 02/09/21 1451  BP: 118/60 131/89  Pulse: 90 90  Resp: 18 18  Temp: 98 F (36.7 C) 98.9 F (37.2 C)  TempSrc: Oral   SpO2: 99% 100%  Weight:  79.7 kg  Height:  5\' 2"  (1.575 m)   Eyes: PERRL, lids and conjunctivae normal ENMT: Mucous membranes are moist.  Neck: normal, supple, no masses, no  thyromegaly Respiratory: clear to auscultation bilaterally, no wheezing, no crackles. Normal respiratory effort. No accessory muscle use.  Cardiovascular: Regular rate and rhythm, no murmurs / rubs / gallops. No extremity edema.  Abdomen: no tenderness, no masses palpated. No hepatosplenomegaly. Bowel sounds positive.  Musculoskeletal: no clubbing / cyanosis. No joint deformity upper and lower extremities.  Skin: no rashes, lesions, ulcers. No induration Neurologic: CN 2-12 grossly intact.  Strength 5/5 in all 4.  Psychiatric: Normal judgment and insight. Alert and oriented x 3. Normal mood.   Foley Catheter:None  Labs on Admission: I have personally reviewed following labs and imaging studies  CBC: Recent Labs  Lab 02/09/21 1102  WBC 2.8*  NEUTROABS 1.3*  HGB 12.4  HCT 36.7  MCV 98.7  PLT 97*   Basic Metabolic Panel: Recent Labs  Lab 02/09/21 1102  NA 137  K 3.8  CL 100  CO2 25  GLUCOSE 85  BUN 11  CREATININE 1.19*  CALCIUM 8.8*   GFR: Estimated Creatinine Clearance: 66.3 mL/min (A) (by C-G formula based on SCr of 1.19 mg/dL (H)). Liver Function Tests: Recent Labs  Lab 02/09/21 1102  AST 336*  ALT 151*  ALKPHOS 363*  BILITOT 5.0*  PROT 7.8  ALBUMIN 3.4*   No results for input(s): LIPASE, AMYLASE in the last 168 hours. No results for input(s): AMMONIA in the last 168 hours. Coagulation Profile: No results for input(s): INR, PROTIME in the last 168 hours. Cardiac Enzymes: No results for input(s): CKTOTAL, CKMB, CKMBINDEX, TROPONINI in the last 168 hours. BNP (last 3 results) No results for input(s): PROBNP in the last 8760 hours. HbA1C: No results for input(s): HGBA1C in the last 72 hours. CBG: No results for input(s): GLUCAP in the last 168 hours. Lipid Profile: No results for input(s): CHOL, HDL, LDLCALC, TRIG, CHOLHDL, LDLDIRECT in the last 72 hours. Thyroid Function Tests: No results for input(s): TSH, T4TOTAL, FREET4, T3FREE, THYROIDAB in the last  72 hours. Anemia Panel: No results for input(s): VITAMINB12, FOLATE, FERRITIN, TIBC, IRON, RETICCTPCT in the last 72 hours. Urine analysis:    Component Value Date/Time   COLORURINE YELLOW 06/19/2010 1113   APPEARANCEUR CLEAR 06/19/2010 1113   LABSPEC 1.025 06/19/2010 1113   PHURINE 5.5 06/19/2010 1113   GLUCOSEU NEGATIVE 06/19/2010 1113   HGBUR NEGATIVE 06/19/2010 1113   BILIRUBINUR NEGATIVE 06/19/2010 1113   KETONESUR NEGATIVE 06/19/2010 1113   PROTEINUR NEGATIVE 06/19/2010 1113   UROBILINOGEN 0.2 06/19/2010 1113   NITRITE NEGATIVE 06/19/2010 1113   LEUKOCYTESUR SMALL (A) 06/19/2010 1113    Radiological Exams on Admission: No results found.   Assessment/Plan Principal Problem:   Abdominal pain Active Problems:   Malignant neoplasm of overlapping sites of left breast in female, estrogen receptor positive (HCC)   Hepatic metastasis (HCC)   Elevated liver enzymes  Abdominal pain: Complained of right upper quadrant abdominal discomfort along with nausea.  This is most likely secondary to hepatic metastasis .she has tender hepatomegaly .we will continue supportive care, symptomatic management.  Continue pain management, IV fluids, antiemetics.  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  Follows with oncology.  Status post bilateral mastectomy, radiation treatment, currently on antiestrogen oral therapy: Letrozole, Faslodex, Ibrance plus Zoladex along with Xgeva.  Bone scan had shown bony metastasis now with finding of hepatic metastasis. Oncology recommends liver biopsy to run prognostic panel as well as molecular testing that would help with for consideration of change in the medical treatment. Will plan for liver biopsy after MRCP.  Elevated liver enzymes: Secondary to hepatic metastasis.  She has elevated bilirubin.  Could be secondary to biliary obstruction.  If MRI confirms this, will consult GI for consideration of biliary stent  placement.  Pancytopenia: This is secondary to malignancy.  Continue to monitor the levels.  Mild AKI: Continue gentle IV fluids  Obesity: BMI of 32.1    Severity of Illness: The appropriate patient status for this patient is OBSERVATION.   DVT prophylaxis: Lovenox Code Status: Full code Family Communication:  Consults called: Case discussed with GI, oncology     Shelly Coss MD [facility]  02/09/2021, 3:22 PM

## 2021-02-09 NOTE — Progress Notes (Signed)
Per MD request RN placed call to [facility] ED charge RN Denton Ar and provided RN with SBAR report regarding pt situation and that pt was advised by MD to head to the ED ASAP for further evaluation and tx of increased liver enzymes. Denton Ar, RN verbalized understanding.

## 2021-02-09 NOTE — Plan of Care (Signed)
  Problem: Clinical Measurements: Goal: Will remain free from infection Outcome: Progressing   Problem: Clinical Measurements: Goal: Diagnostic test results will improve Outcome: Progressing   Problem: Clinical Measurements: Goal: Respiratory complications will improve Outcome: Progressing   Problem: Clinical Measurements: Goal: Cardiovascular complication will be avoided Outcome: Progressing   Problem: Elimination: Goal: Will not experience complications related to bowel motility Outcome: Progressing

## 2021-02-09 NOTE — Assessment & Plan Note (Signed)
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 Neoadjuvant chemotherapy with dose dense Adriamycin and Cytoxan given every 3 weeks without Neulasta support given her pregnancycompleted 05/28/2019 2. Post-Partumweekly Taxol x12starting 07/03/2019 3. Bilateral mastectomy 10/08/2019: Right mastectomy: Multifocal grade 2 IDC 0.8 cm and 0.6 cm with high-grade DCIS, margins negative, 0/4lymph nodes ER 95%, PR 5%, Ki-67 10%, HER-2 negative;left mastectomy: IDC 0.6 cm, LVI, margins negative, 1/4 lymph nodes positive, ER 60%, PR 60%, HER-2 negative, Ki-67 50% RCB class II 4. Adjuvant radiation therapy 5. Followed by adjuvant antiestrogen therapy with complete estrogen blockade (ovarian suppression with AI) CT C/A/P on 5/13 shows sclerotic bone lesions, bone scan negative 6.DeclinedCDK 4 and 6 inhibitor abemaciclibwith antiestrogen therapy ---------------------------------------------------------------------------------------------------------------------------------------------------- Bone scan 04/01/2020: Interval development of multifocal abnormal areas of increased tracer uptake compatible with diffuse bone metastases bony pelvis, right acetabulum, left pubic bone, anterior left lower rib, thoracic and upper lumbar spine including T6, T9, L1, intertrochanteric region of right femur  03/31/2020: CT CAP: Bone metastases. No visceral mets.  Current treatment plan: Faslodex+letrozole +Ibrance plus Zoladex along with  Xgeva  IbranceToxicities: Neutropenia:Stable white blood cell count on 100 mg of Ibrance. She is tolerating it very well.  07/15/20:CT CAP: Previous bone lesionsshow sclerotic changes suggesting healing. Interval increase in size of small right axillary lymph nodes. No evidence of any distant soft tissue mets 07/15/2020: Bone scan: Response in the bones is noted.  Bone metastases: Currently on Xgeva 02/07/2021: Abdominal pain: Ultrasound abdomen: Heterogeneous hepatic parenchyma diffuse hypoechoic multiple 1 cm masses throughout the liver concerning for metastatic disease to the liver 02/07/2021: CT abdomen pelvis: Development of innumerable 1 cm small low-density foci throughout the liver consistent with metastatic disease  Plan: Consider biopsying the liver to run prognostic panel as well as molecular testing We may have to consider changing treatment to Enhertu (HER2 1+ by IHC on the mastectomy specimen) other options of systemic chemotherapy like Xeloda, Trodelvy etc.

## 2021-02-09 NOTE — Patient Instructions (Signed)
Goserelin injection What is this medication? GOSERELIN (GOE se rel in) is similar to a hormone found in the body. It lowers the amount of sex hormones that the body makes. Men will have lower testosterone levels and women will have lower estrogen levels while taking this medicine. In men, this medicine is used to treat prostate cancer; the injection is either given once per month or once every 12 weeks. A once per month injection (only) is used to treat women with endometriosis, dysfunctional uterine bleeding, or advanced breast cancer. This medicine may be used for other purposes; ask your health care provider or pharmacist if you have questions. COMMON BRAND NAME(S): Zoladex, Zoladex 37-Month What should I tell my care team before I take this medication? They need to know if you have any of these conditions: bone problems diabetes heart disease history of irregular heartbeat an unusual or allergic reaction to goserelin, other medicines, foods, dyes, or preservatives pregnant or trying to get pregnant breast-feeding How should I use this medication? This medicine is for injection under the skin. It is given by a health care professional in a hospital or clinic setting. Talk to your pediatrician regarding the use of this medicine in children. Special care may be needed. Overdosage: If you think you have taken too much of this medicine contact a poison control center or emergency room at once. NOTE: This medicine is only for you. Do not share this medicine with others. What if I miss a dose? It is important not to miss your dose. Call your doctor or health care professional if you are unable to keep an appointment. What may interact with this medication? Do not take this medicine with any of the following medications: cisapride dronedarone pimozide thioridazine This medicine may also interact with the following medications: other medicines that prolong the QT interval (an abnormal heart  rhythm) This list may not describe all possible interactions. Give your health care provider a list of all the medicines, herbs, non-prescription drugs, or dietary supplements you use. Also tell them if you smoke, drink alcohol, or use illegal drugs. Some items may interact with your medicine. What should I watch for while using this medication? Visit your doctor or health care provider for regular checks on your progress. Your symptoms may appear to get worse during the first weeks of this therapy. Tell your doctor or healthcare provider if your symptoms do not start to get better or if they get worse after this time. Your bones may get weaker if you take this medicine for a long time. If you smoke or frequently drink alcohol you may increase your risk of bone loss. A family history of osteoporosis, chronic use of drugs for seizures (convulsions), or corticosteroids can also increase your risk of bone loss. Talk to your doctor about how to keep your bones strong. This medicine should stop regular monthly menstruation in women. Tell your doctor if you continue to menstruate. Women should not become pregnant while taking this medicine or for 12 weeks after stopping this medicine. Women should inform their doctor if they wish to become pregnant or think they might be pregnant. There is a potential for serious side effects to an unborn child. Talk to your health care professional or pharmacist for more information. Do not breast-feed an infant while taking this medicine. Men should inform their doctors if they wish to father a child. This medicine may lower sperm counts. Talk to your health care professional or pharmacist for more information. This  medicine may increase blood sugar. Ask your healthcare provider if changes in diet or medicines are needed if you have diabetes. What side effects may I notice from receiving this medication? Side effects that you should report to your doctor or health care  professional as soon as possible: allergic reactions like skin rash, itching or hives, swelling of the face, lips, or tongue bone pain breathing problems changes in vision chest pain feeling faint or lightheaded, falls fever, chills pain, swelling, warmth in the leg pain, tingling, numbness in the hands or feet signs and symptoms of high blood sugar such as being more thirsty or hungry or having to urinate more than normal. You may also feel very tired or have blurry vision signs and symptoms of low blood pressure like dizziness; feeling faint or lightheaded, falls; unusually weak or tired stomach pain swelling of the ankles, feet, hands trouble passing urine or change in the amount of urine unusually high or low blood pressure unusually weak or tired Side effects that usually do not require medical attention (report to your doctor or health care professional if they continue or are bothersome): change in sex drive or performance changes in breast size in both males and females changes in emotions or moods headache hot flashes irritation at site where injected loss of appetite skin problems like acne, dry skin vaginal dryness This list may not describe all possible side effects. Call your doctor for medical advice about side effects. You may report side effects to FDA at 1-800-FDA-1088. Where should I keep my medication? This drug is given in a hospital or clinic and will not be stored at home. NOTE: This sheet is a summary. It may not cover all possible information. If you have questions about this medicine, talk to your doctor, pharmacist, or health care provider. Denosumab injection What is this medication? DENOSUMAB (den oh sue mab) slows bone breakdown. Prolia is used to treat osteoporosis in women after menopause and in men, and in people who are taking corticosteroids for 6 months or more. Delton See is used to treat a high calcium level due to cancer and to prevent bone fractures  and other bone problems caused by multiple myeloma or cancer bone metastases. Delton See is also used to treat giant cell tumor of the bone. This medicine may be used for other purposes; ask your health care provider or pharmacist if you have questions. COMMON BRAND NAME(S): Prolia, XGEVA What should I tell my care team before I take this medication? They need to know if you have any of these conditions: dental disease having surgery or tooth extraction infection kidney disease low levels of calcium or Vitamin D in the blood malnutrition on hemodialysis skin conditions or sensitivity thyroid or parathyroid disease an unusual reaction to denosumab, other medicines, foods, dyes, or preservatives pregnant or trying to get pregnant breast-feeding How should I use this medication? This medicine is for injection under the skin. It is given by a health care professional in a hospital or clinic setting. A special MedGuide will be given to you before each treatment. Be sure to read this information carefully each time. For Prolia, talk to your pediatrician regarding the use of this medicine in children. Special care may be needed. For Delton See, talk to your pediatrician regarding the use of this medicine in children. While this drug may be prescribed for children as young as 13 years for selected conditions, precautions do apply. Overdosage: If you think you have taken too much of this medicine  contact a poison control center or emergency room at once. NOTE: This medicine is only for you. Do not share this medicine with others. What if I miss a dose? It is important not to miss your dose. Call your doctor or health care professional if you are unable to keep an appointment. What may interact with this medication? Do not take this medicine with any of the following medications: other medicines containing denosumab This medicine may also interact with the following medications: medicines that lower your  chance of fighting infection steroid medicines like prednisone or cortisone This list may not describe all possible interactions. Give your health care provider a list of all the medicines, herbs, non-prescription drugs, or dietary supplements you use. Also tell them if you smoke, drink alcohol, or use illegal drugs. Some items may interact with your medicine. What should I watch for while using this medication? Visit your doctor or health care professional for regular checks on your progress. Your doctor or health care professional may order blood tests and other tests to see how you are doing. Call your doctor or health care professional for advice if you get a fever, chills or sore throat, or other symptoms of a cold or flu. Do not treat yourself. This drug may decrease your body's ability to fight infection. Try to avoid being around people who are sick. You should make sure you get enough calcium and vitamin D while you are taking this medicine, unless your doctor tells you not to. Discuss the foods you eat and the vitamins you take with your health care professional. See your dentist regularly. Brush and floss your teeth as directed. Before you have any dental work done, tell your dentist you are receiving this medicine. Do not become pregnant while taking this medicine or for 5 months after stopping it. Talk with your doctor or health care professional about your birth control options while taking this medicine. Women should inform their doctor if they wish to become pregnant or think they might be pregnant. There is a potential for serious side effects to an unborn child. Talk to your health care professional or pharmacist for more information. What side effects may I notice from receiving this medication? Side effects that you should report to your doctor or health care professional as soon as possible: allergic reactions like skin rash, itching or hives, swelling of the face, lips, or  tongue bone pain breathing problems dizziness jaw pain, especially after dental work redness, blistering, peeling of the skin signs and symptoms of infection like fever or chills; cough; sore throat; pain or trouble passing urine signs of low calcium like fast heartbeat, muscle cramps or muscle pain; pain, tingling, numbness in the hands or feet; seizures unusual bleeding or bruising unusually weak or tired Side effects that usually do not require medical attention (report to your doctor or health care professional if they continue or are bothersome): constipation diarrhea headache joint pain loss of appetite muscle pain runny nose tiredness upset stomach This list may not describe all possible side effects. Call your doctor for medical advice about side effects. You may report side effects to FDA at 1-800-FDA-1088. Where should I keep my medication? This medicine is only given in a clinic, doctor's office, or other health care setting and will not be stored at home. NOTE: This sheet is a summary. It may not cover all possible information. If you have questions about this medicine, talk to your doctor, pharmacist, or health care provider.  2022 Elsevier/Gold Standard (2017-07-05 00:00:00)   2022 Elsevier/Gold Standard (2018-06-27 00:00:00)

## 2021-02-09 NOTE — ED Provider Notes (Signed)
Emergency Medicine Provider Triage Evaluation Note  Melissa [name] , a 30 y.o. female  was evaluated in triage.  Pt complains of right upper quadrant pain, is been going on for the last 2 weeks.  Was seen in the ED 02/07/2021 and had a CT scan and ultrasound is negative for cholecystitis but did show findings consistent with liver mets from metastatic breast cancer.  Seen by her oncology office today, her bilirubin was elevated to 6 and her alk phos was elevated to the 400s.  Told to go to the ED for liver MRI, admission, and will need a gastroenterology consult.  Review of Systems  Positive: AP Negative: FEVER  Physical Exam  BP 118/60   Pulse 90   Temp 98 F (36.7 C) (Oral)   Resp 18   SpO2 99%  Gen:   Awake, no distress   Resp:  Normal effort  MSK:   Moves extremities without difficulty  Other:  Right upper quadrant tenderness with voluntary guarding, abdomen is soft  Medical Decision Making  Medically screening exam initiated at 1:32 PM.  Appropriate orders placed.  Clay Menser was informed that the remainder of the evaluation will be completed by another provider, this initial triage assessment does not replace that evaluation, and the importance of remaining in the ED until their evaluation is complete.  Liver mets, admission   Sherrill Raring, Hershal Coria 02/09/21 1336    Isla Pence, MD 02/09/21 [phone]

## 2021-02-09 NOTE — Progress Notes (Signed)
Provider notified of need for diet order. Provider would like to leave pt npo for potential testing tomorrow. Rn will continue to monitor.

## 2021-02-09 NOTE — ED Provider Notes (Signed)
[facility] DEPT Provider Note   CSN: [id number] Arrival date & time: 02/09/21  1251     History Chief Complaint  Patient presents with   Abnormal Lab    Melissa [name] is a 30 y.o. female.  Pt presents to the ED today with abnormal LFTs.  Pt has an unfortunate history of breast cancer diagnosed 2 years ago when she was pregnant.  She has been having some RUQ tenderness and had a CT and a GB US which showed multiple liver mets.  The LFTs were repeated today at Dr. Geralyn Flash office.  He sent her over here for admission, MRI of the liver, and GI consult.      Past Medical History:  Diagnosis Date   Abnormal Pap smear 07/13/11   ASCUS +HPV   Anemia    CHF (congestive heart failure) ([place])    Family history of breast cancer    Family history of colon cancer    Family history of throat cancer    Fracture of distal phalanx of finger 10/05/2019   Right thumb and middle finger   Herpes simplex without mention of complication    dx age 33   left breast ca dx'd 02/26/19   breast cancer    Patient Active Problem List   Diagnosis Date Noted   Abdominal pain 02/09/2021   Hepatic metastasis ([place]) 02/09/2021   Elevated liver enzymes 02/09/2021   Breast cancer, left breast ([place]) 10/08/2019   Genetic testing 04/15/2019   Port-A-Cath in place 03/25/2019   Family history of breast cancer    Family history of colon cancer    Family history of throat cancer    Malignant neoplasm of overlapping sites of left breast in female, estrogen receptor positive ([place]) 03/04/2019   PCOS (polycystic ovarian syndrome) 08/22/2012   Anovulation 07/09/2012   ASCUS (atypical squamous cells of undetermined significance) on Pap smear 12/04/2010    Past Surgical History:  Procedure Laterality Date   BREAST BIOPSY Left 03/18/2019   Procedure: LEFT BREAST SKIN PUNCH BIOPSY;  Surgeon: Rolm Bookbinder, MD;  Location: [facility];  Service: General;  Laterality: Left;   COLPOSCOPY      CIN I ECC -   MASTECTOMY Bilateral 10/08/2019   BILATERAL MASTECTOMY WITH LEFT AXILLARY SENTINEL LYMPH NODE BIOPSY (Bilateral Breast)    MASTECTOMY W/ SENTINEL NODE BIOPSY Bilateral 10/08/2019   Procedure: BILATERAL MASTECTOMY WITH LEFT AXILLARY SENTINEL LYMPH NODE BIOPSY;  Surgeon: Rolm Bookbinder, MD;  Location: [facility];  Service: General;  Laterality: Bilateral;  BILATERAL PEC BLOCK   NO PAST SURGERIES     PORTACATH PLACEMENT N/A 03/18/2019   Procedure: INSERTION PORT-A-CATH WITH ULTRASOUND GUIDANCE;  Surgeon: Rolm Bookbinder, MD;  Location: [facility];  Service: General;  Laterality: N/A;   RADIOACTIVE SEED GUIDED AXILLARY SENTINEL LYMPH NODE Left 10/08/2019   Procedure: LEFT RADIOACTIVE SEED GUIDED AXILLARY SENTINEL LYMPH NODE EXCISION;  Surgeon: Rolm Bookbinder, MD;  Location: [facility];  Service: General;  Laterality: Left;  PEC BLOCK     OB History     Gravida  2   Para  1   Term  0   Preterm  1   AB  0   Living  1      SAB  0   IAB  0   Ectopic  0   Multiple  0   Live Births  1           Family History  Problem Relation Age of Onset  Breast cancer Paternal Grandmother        dx. 9s or younger   Stroke Father 77   Hypertension Father    Cancer Maternal Aunt        unknown type, dx. late 71s   Colon cancer Paternal Uncle        dx 56s   Throat cancer Maternal Grandmother        dx. 63s   Cancer Paternal Uncle        unknown type, dx. 30s   Breast cancer Cousin        dx. 24s, paternal 1st cousin    Social History   Tobacco Use   Smoking status: Former    Types: Cigarettes    Quit date: 02/27/2019    Years since quitting: 1.9   Smokeless tobacco: Never  Vaping Use   Vaping Use: Never used  Substance Use Topics   Alcohol use: Not Currently   Drug use: Yes    Frequency: 2.0 times per week    Types: Marijuana    Comment: x1 weekly    Home Medications Prior to Admission medications   Medication Sig Start Date End Date Taking?  Authorizing Provider  anastrozole (ARIMIDEX) 1 MG tablet TAKE 1 TABLET(1 MG) BY MOUTH DAILY Patient taking differently: Take 1 mg by mouth daily. 01/16/21  Yes Nicholas Lose, MD  oxyCODONE (OXY IR/ROXICODONE) 5 MG immediate release tablet Take 1 tablet (5 mg total) by mouth every 4 (four) hours as needed for severe pain. 02/09/21  Yes Nicholas Lose, MD  palbociclib (IBRANCE) 100 MG tablet TAKE 1 TABLET BY MOUTH DAILY. TAKE FOR 21 DAYS ON, 7 DAYS OFF, REPEAT EVERY 28 DAYS. Patient taking differently: 100 mg as directed. 11/22/20 11/22/21 Yes Nicholas Lose, MD  ondansetron (ZOFRAN-ODT) 4 MG disintegrating tablet Take 1 tablet (4 mg total) by mouth every 8 (eight) hours as needed for nausea or vomiting. 02/09/21   Nicholas Lose, MD    Allergies    Patient has no known allergies.  Review of Systems   Review of Systems  Gastrointestinal:  Positive for abdominal pain.  All other systems reviewed and are negative.  Physical Exam Updated Vital Signs BP 131/89 (BP Location: Right Arm)   Pulse 90   Temp 98.9 F (37.2 C)   Resp 18   Ht 5\' 2"  (1.575 m)   Wt 79.7 kg   SpO2 100%   BMI 32.14 kg/m   Physical Exam Vitals and nursing note reviewed.  Constitutional:      Appearance: Normal appearance.  HENT:     Head: Normocephalic and atraumatic.     Right Ear: External ear normal.     Left Ear: External ear normal.     Nose: Nose normal.     Mouth/Throat:     Mouth: Mucous membranes are dry.  Eyes:     Extraocular Movements: Extraocular movements intact.     Conjunctiva/sclera: Conjunctivae normal.     Pupils: Pupils are equal, round, and reactive to light.  Cardiovascular:     Rate and Rhythm: Normal rate and regular rhythm.     Pulses: Normal pulses.     Heart sounds: Normal heart sounds.  Pulmonary:     Effort: Pulmonary effort is normal.     Breath sounds: Normal breath sounds.  Abdominal:     General: Abdomen is flat. Bowel sounds are normal.     Palpations: Abdomen is soft.      Tenderness: There is abdominal tenderness in  the right upper quadrant.  Musculoskeletal:        General: Normal range of motion.     Cervical back: Normal range of motion and neck supple.  Skin:    General: Skin is warm.     Capillary Refill: Capillary refill takes less than 2 seconds.  Neurological:     General: No focal deficit present.     Mental Status: She is alert and oriented to person, place, and time.  Psychiatric:        Mood and Affect: Affect is tearful.    ED Results / Procedures / Treatments   Labs (all labs ordered are listed, but only abnormal results are displayed) Labs Reviewed  RESP PANEL BY RT-PCR (FLU A&B, COVID) ARPGX2  HIV ANTIBODY (ROUTINE TESTING W REFLEX)    EKG None  Radiology No results found.  Procedures Procedures   Medications Ordered in ED Medications  LORazepam (ATIVAN) injection 1 mg (has no administration in time range)  enoxaparin (LOVENOX) injection 40 mg (has no administration in time range)  morphine 2 MG/ML injection 2 mg (has no administration in time range)  oxyCODONE (Oxy IR/ROXICODONE) immediate release tablet 5 mg (has no administration in time range)  polyethylene glycol (MIRALAX / GLYCOLAX) packet 17 g (has no administration in time range)  morphine 4 MG/ML injection 4 mg (4 mg Intravenous Given 02/09/21 1511)  ondansetron (ZOFRAN) injection 4 mg (4 mg Intravenous Given 02/09/21 1512)    ED Course  I have reviewed the triage vital signs and the nursing notes.  Pertinent labs & imaging results that were available during my care of the patient were reviewed by me and considered in my medical decision making (see chart for details).    MDM Rules/Calculators/A&P                           Pt d/w Dr. Watt Climes [facility]) who recommended a MRCP.  If that is abnormal, then he will see her as an inpatient.  Pt d/w Dr. Tawanna Solo ([facility]) who will admit pt for pain control and further eval of her elevation in LFTs.  [facility] will need to  re-consult GI if MRCP is abnormal.  Final Clinical Impression(s) / ED Diagnoses Final diagnoses:  Elevated liver function tests  Liver metastases ([place])  Malignant neoplasm of left female breast, unspecified estrogen receptor status, unspecified site of breast [facility])    Rx / [place] Orders ED Discharge Orders     None        Isla Pence, MD 02/09/21 [phone]

## 2021-02-10 ENCOUNTER — Encounter (HOSPITAL_COMMUNITY): Payer: Self-pay | Admitting: Internal Medicine

## 2021-02-10 ENCOUNTER — Observation Stay (HOSPITAL_COMMUNITY): Payer: Medicaid Other

## 2021-02-10 DIAGNOSIS — R1011 Right upper quadrant pain: Secondary | ICD-10-CM | POA: Diagnosis not present

## 2021-02-10 LAB — COMPREHENSIVE METABOLIC PANEL
ALT: 112 U/L — ABNORMAL HIGH (ref 0–44)
AST: 269 U/L — ABNORMAL HIGH (ref 15–41)
Albumin: 3.1 g/dL — ABNORMAL LOW (ref 3.5–5.0)
Alkaline Phosphatase: 253 U/L — ABNORMAL HIGH (ref 38–126)
Anion gap: 6 (ref 5–15)
BUN: 10 mg/dL (ref 6–20)
CO2: 27 mmol/L (ref 22–32)
Calcium: 7.9 mg/dL — ABNORMAL LOW (ref 8.9–10.3)
Chloride: 102 mmol/L (ref 98–111)
Creatinine, Ser: 1.14 mg/dL — ABNORMAL HIGH (ref 0.44–1.00)
GFR, Estimated: >60 mL/min (ref >60)
Glucose, Bld: 102 mg/dL — ABNORMAL HIGH (ref 70–99)
Potassium: 3.8 mmol/L (ref 3.5–5.1)
Sodium: 135 mmol/L (ref 135–145)
Total Bilirubin: 4 mg/dL — ABNORMAL HIGH (ref 0.3–1.2)
Total Protein: 6.5 g/dL (ref 6.5–8.1)

## 2021-02-10 LAB — PROTIME-INR
INR: 1.2 (ref 0.8–1.2)
Prothrombin Time: 14.7 seconds (ref 11.4–15.2)

## 2021-02-10 LAB — CBC
HCT: 30.7 % — ABNORMAL LOW (ref 36.0–46.0)
Hemoglobin: 10.3 g/dL — ABNORMAL LOW (ref 12.0–15.0)
MCH: 33.8 pg (ref 26.0–34.0)
MCHC: 33.6 g/dL (ref 30.0–36.0)
MCV: 100.7 fL — ABNORMAL HIGH (ref 80.0–100.0)
Platelets: 78 10*3/uL — ABNORMAL LOW (ref 150–400)
RBC: 3.05 MIL/uL — ABNORMAL LOW (ref 3.87–5.11)
RDW: 16.4 % — ABNORMAL HIGH (ref 11.5–15.5)
WBC: 2.2 10*3/uL — ABNORMAL LOW (ref 4.0–10.5)
nRBC: 14.4 % — ABNORMAL HIGH (ref 0.0–0.2)

## 2021-02-10 LAB — HIV ANTIBODY (ROUTINE TESTING W REFLEX): HIV Screen 4th Generation wRfx: NONREACTIVE

## 2021-02-10 IMAGING — US US BIOPSY CORE LIVER
1 series · 13 of 25 positions shown · non-contrast
Comparison: none

INDICATION: 30-year-old woman with history of breast cancer presents to IR for
biopsy of new diffuse liver lesions.

[Series 1: us biopsy core liver · 13 of 39 slices shown]
[im 1/39]
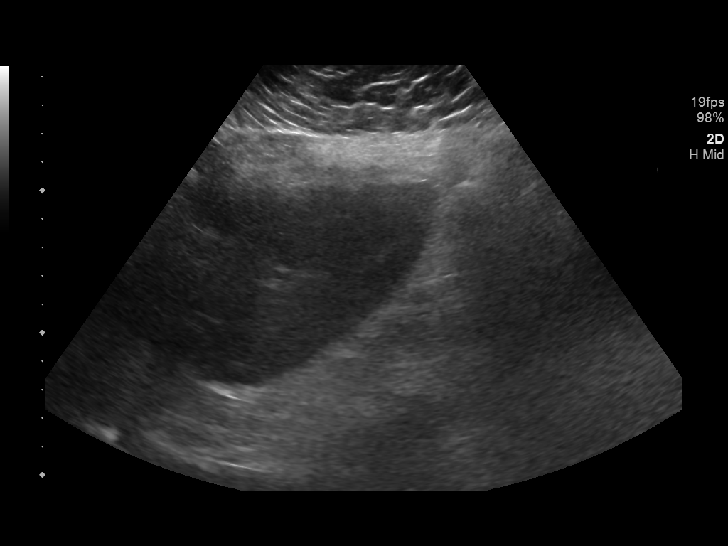
[im 4/39]
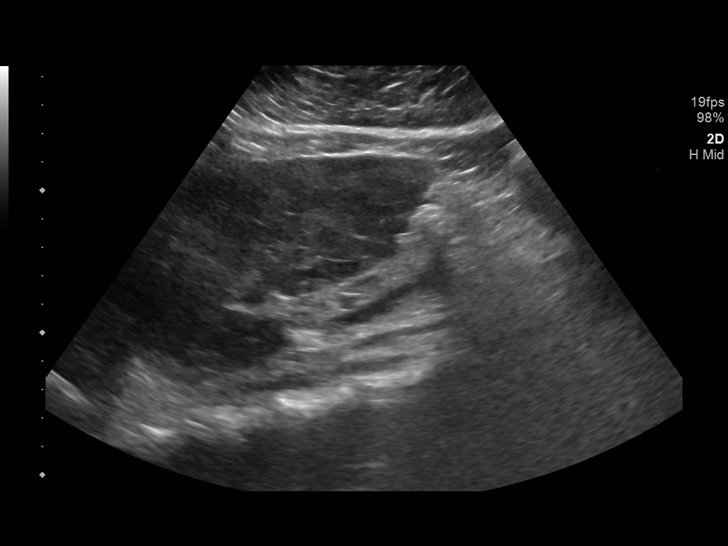
[im 7/39]
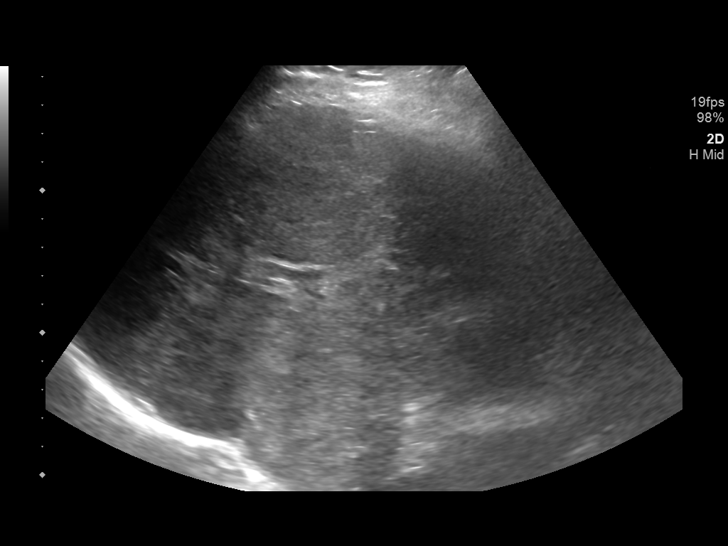
[im 10/39]
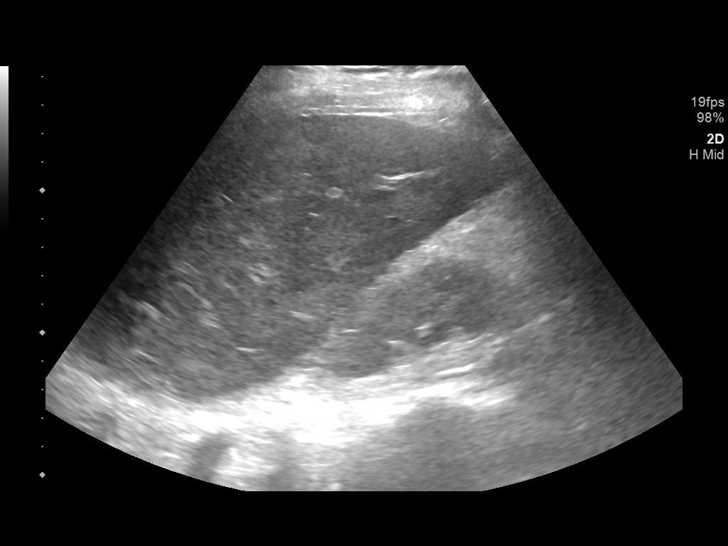
[im 13/39]
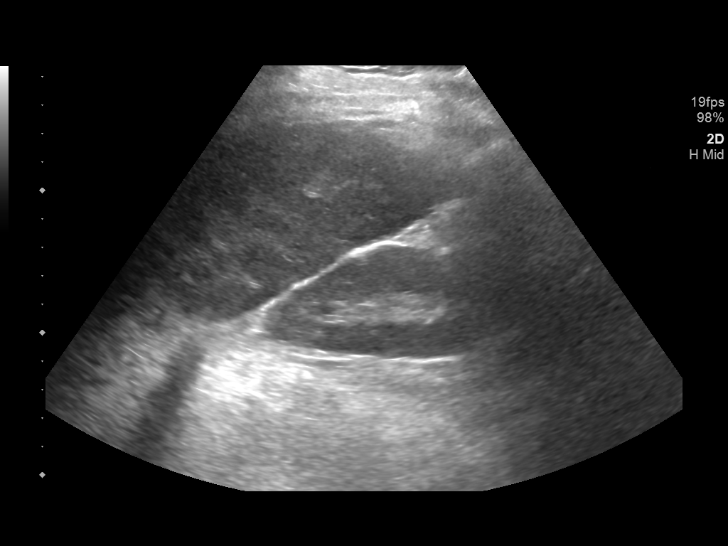
[im 16/39]
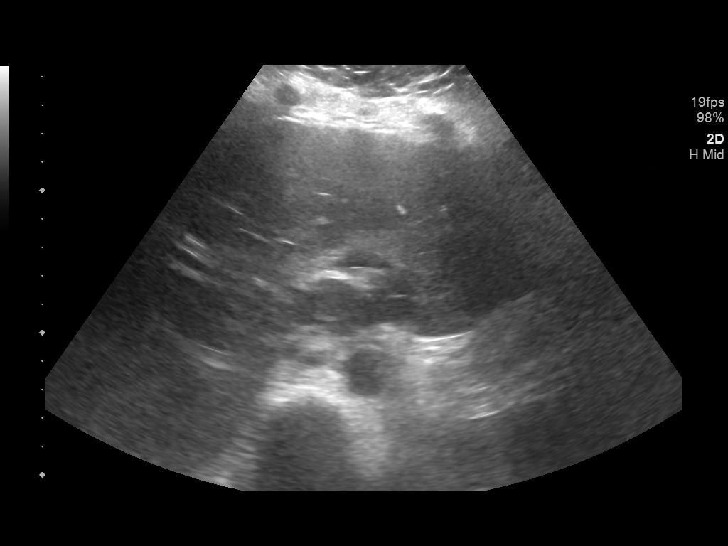
[im 20/39]
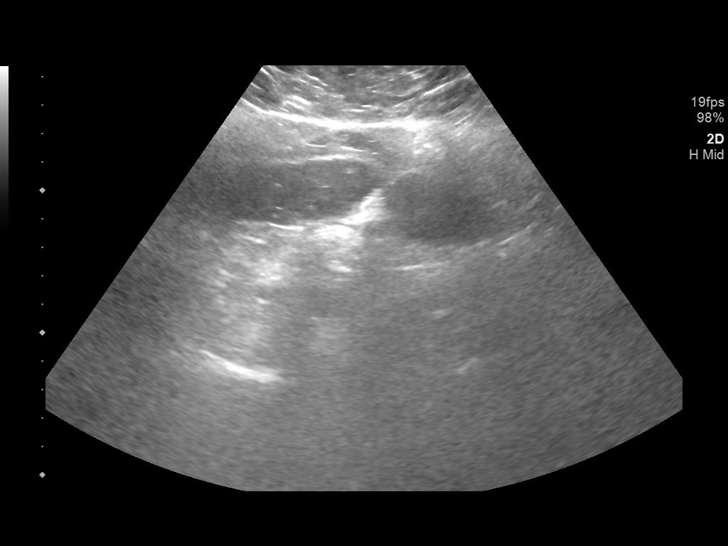
[im 23/39]
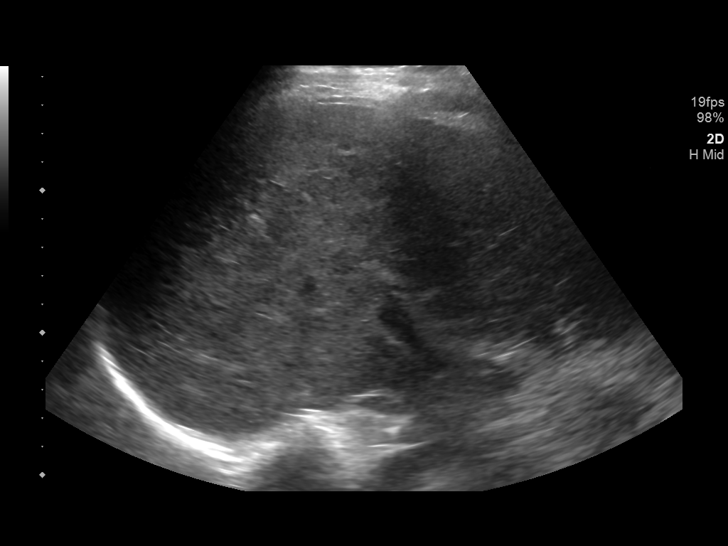
[im 26/39]
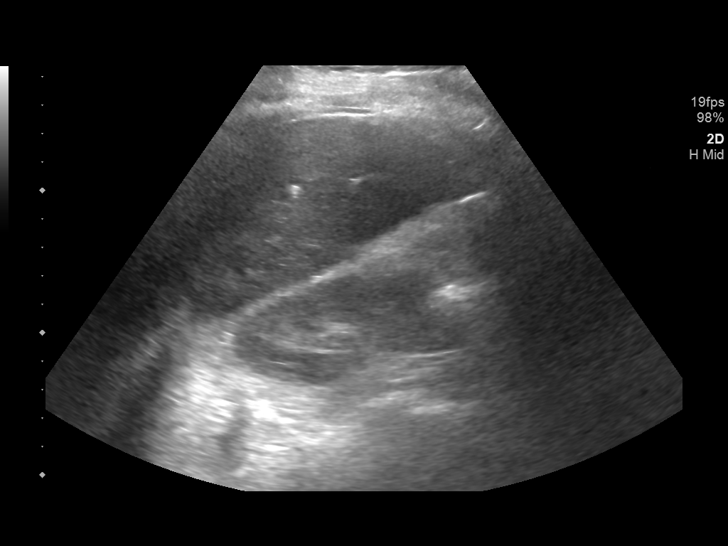
[im 29/39]
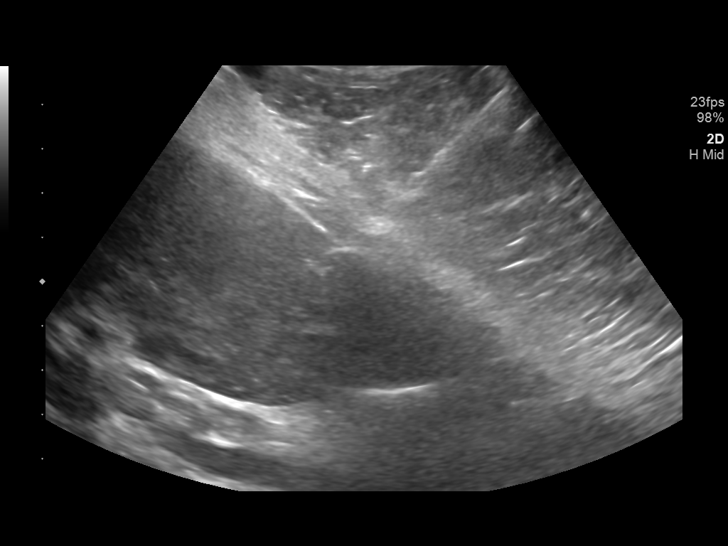
[im 32/39]
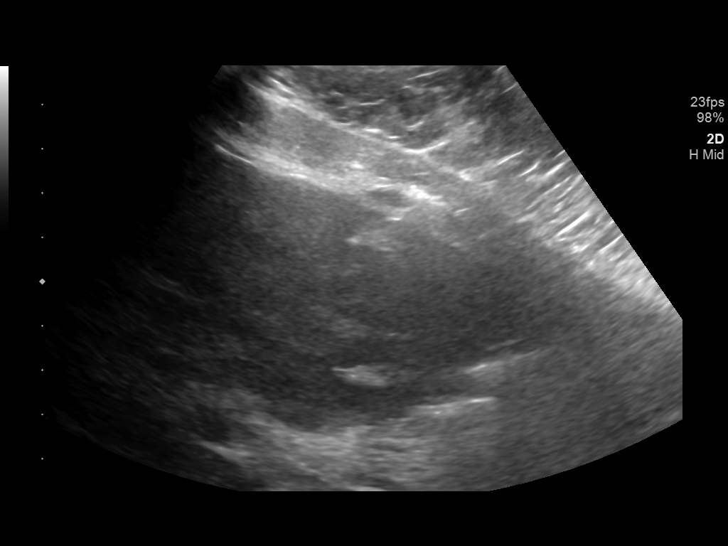
[im 35/39]
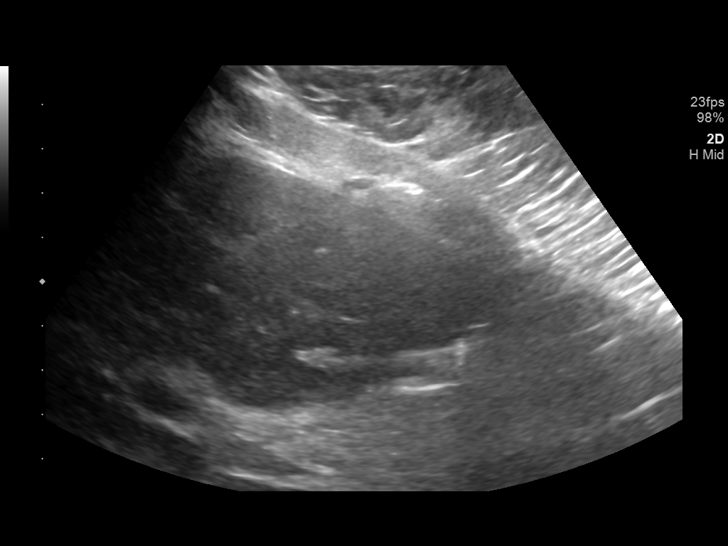
[im 39/39]
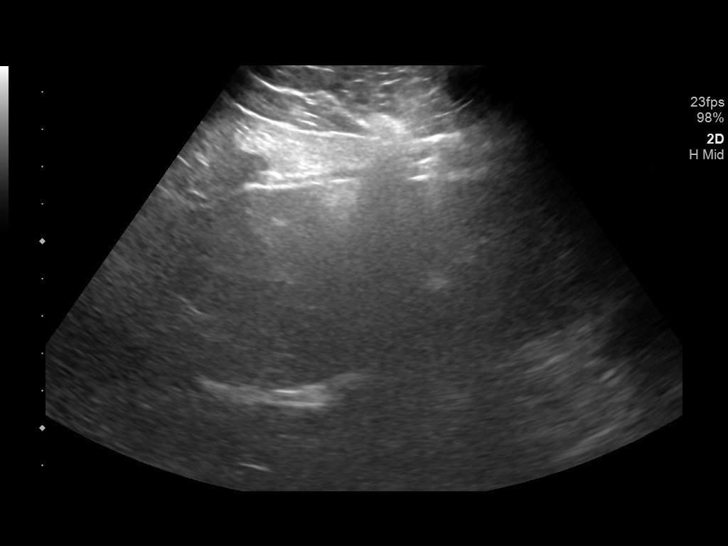

[13 of 25 positions shown; findings below may reference images not displayed]

EXAM:
Ultrasound-guided biopsy of left liver mass

MEDICATIONS:
None.

ANESTHESIA/SEDATION:
Moderate (conscious) sedation was employed during this procedure. A
total of Versed 2 mg and Fentanyl 50 mcg was administered
intravenously.

Moderate Sedation Time: 10 minutes. The patient's level of
consciousness and vital signs were monitored continuously by
radiology nursing throughout the procedure under my direct
supervision.

COMPLICATIONS:
None immediate.

PROCEDURE:
Informed written consent was obtained from the patient after a
thorough discussion of the procedural risks, benefits and
alternatives. All questions were addressed. Maximal Sterile Barrier
Technique was utilized including caps, mask, sterile gowns, sterile
gloves, sterile drape, hand hygiene and skin antiseptic. A timeout
was performed prior to the initiation of the procedure.

Patient position supine on the ultrasound table.

Epigastric skin prepped and draped in usual sterile fashion.

Following local lidocaine administration, 17 gauge introducer needle
was advanced into the left hepatic lobe, and three 18 gauge cores
were obtained utilizing continuous ultrasound guidance.

Gelfoam slurry was administered through the introducer needle at the
biopsy site.

Samples were sent to pathology in formalin.

Needle removed and hemostasis achieved with 5 minutes of manual
compression.

Post procedure ultrasound images showed no evidence of significant
hemorrhage.
IMPRESSION: Ultrasound-guided biopsy of left liver lesions.

## 2021-02-10 MED ORDER — LIDOCAINE HCL 1 % IJ SOLN
INTRAMUSCULAR | Status: AC
  Filled 2021-02-10: qty 20

## 2021-02-10 MED ORDER — PALBOCICLIB 100 MG PO TABS
100.0000 mg | ORAL_TABLET | Freq: Every day | ORAL | Status: DC
  Administered 2021-02-10 – 2021-02-11 (×2): 100 mg via ORAL

## 2021-02-10 MED ORDER — PALBOCICLIB 100 MG PO TABS: 100.0000 mg | ORAL_TABLET | Freq: Every day | ORAL | Status: DC

## 2021-02-10 MED ORDER — FENTANYL CITRATE (PF) 100 MCG/2ML IJ SOLN
INTRAMUSCULAR | Status: AC | PRN
  Administered 2021-02-10: 50 ug via INTRAVENOUS

## 2021-02-10 MED ORDER — MIDAZOLAM HCL 2 MG/2ML IJ SOLN
INTRAMUSCULAR | Status: AC
  Filled 2021-02-10: qty 2

## 2021-02-10 MED ORDER — ENOXAPARIN SODIUM 40 MG/0.4ML IJ SOSY
40.0000 mg | PREFILLED_SYRINGE | INTRAMUSCULAR | Status: DC
  Administered 2021-02-11: 40 mg via SUBCUTANEOUS
  Filled 2021-02-10: qty 0.4

## 2021-02-10 MED ORDER — CHLORHEXIDINE GLUCONATE CLOTH 2 % EX PADS
6.0000 | MEDICATED_PAD | Freq: Every day | CUTANEOUS | Status: DC
  Administered 2021-02-10 – 2021-02-11 (×2): 6 via TOPICAL

## 2021-02-10 MED ORDER — FENTANYL CITRATE (PF) 100 MCG/2ML IJ SOLN
INTRAMUSCULAR | Status: AC
  Filled 2021-02-10: qty 2

## 2021-02-10 MED ORDER — MIDAZOLAM HCL 2 MG/2ML IJ SOLN
INTRAMUSCULAR | Status: AC | PRN
  Administered 2021-02-10 (×2): 1 mg via INTRAVENOUS

## 2021-02-10 NOTE — Procedures (Signed)
Interventional Radiology Procedure Note  Procedure: US guided liver lesion biopsy  Indication: Multiple liver lesions  Findings: Please refer to procedural dictation for full description.  Complications: None  EBL: < 10 mL  Kynzleigh Bandel, MD [phone]   

## 2021-02-10 NOTE — Progress Notes (Signed)
Pt weak with ambulation the restroom with assist by nursing staff. Rn encouraged pt to call when she needs to get up and turned bed alarm on due to this weakness. Rn will continue to monitor.

## 2021-02-10 NOTE — Progress Notes (Signed)
PROGRESS NOTE    Melissa [name]  HWE:[id number] DOB: Aug 20, 1990 DOA: 02/09/2021 PCP: [name], NP   Chief Complain: Abdominal pain, abnormal labs  Brief Narrative:  Melissa [name] is a 30 y.o. 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  She has history of left-sided breast cancer, follows with oncology.  She is status post bilateral mastectomies on 2021, radiation therapy and is currently on antiestrogen oral therapy: Letrozole, Faslodex, Ibrance plus Zoladex along with Xgeva.  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  She presented to the oncology clinic  with complaints of right upper quadrant abdominal pain.  Ultrasound of the abdomen done on 11/29 showed heterogeneous hepatic parenchyma with diffuse hyperechoic multiple masses throughout the liver concerning for metastatic disease to the liver . MRI done on this admission confirmed hepatic metastasis with almost no parenchyma left.  No evidence of biliary obstruction or other or any other explanation for jaundice.  Plan for liver biopsy today.  Assessment & Plan:   Principal Problem:   Abdominal pain Active Problems:   Malignant neoplasm of overlapping sites of left breast in female, estrogen receptor positive (HCC)   Hepatic metastasis (HCC)   Elevated liver enzymes   Abdominal pain: Complained of right upper quadrant abdominal discomfort along with nausea.  This is most likely secondary to hepatic metastasis .she has tender hepatomegaly .we will continue supportive care, symptomatic management.  Continue pain management, IV  fluids, antiemetics.   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  Follows with oncology.  Status post bilateral mastectomy, radiation treatment, currently on antiestrogen oral therapy: Letrozole, Faslodex, Ibrance plus Zoladex along with Xgeva.  Bone scan had shown bony metastasis now with finding of hepatic metastasis. MRI done on this admission confirmed hepatic metastasis with almost no parenchyma left.  No evidence of biliary obstruction or other or any other explanation for jaundice.   Oncology recommends liver biopsy to run prognostic panel as well as molecular testing that would help with for consideration of change in the medical treatment.  Elevated liver enzymes: Secondary to hepatic metastasis.  She has elevated bilirubin.  MRCP did not show biliary obstruction   Pancytopenia: This is secondary to malignancy.  Continue to monitor the levels.   Mild AKI: Continue gentle IV fluids   Obesity: BMI of 32.1         DVT prophylaxis:SCD Code Status: Full Family Communication: Sister at bedside Patient status:  Dispo: The patient is from: Home              Anticipated d/c is to: Home              Anticipated d/c date is: tomorrow, waiting for liver biopsy  Consultants: IR  Procedures: None  Antimicrobials:  Anti-infectives (From admission, onward)    None       Subjective: Patient seen and examined at the bedside this morning.  Hemodynamically stable.  She says her abdominal pain and nausea is better.  Waiting for liver biopsy  Objective: Vitals:   02/09/21 1451 02/09/21 1718 02/09/21 2138 02/10/21 0538  BP: 131/89 118/72 108/73 102/62  Pulse: 90 87 94 91  Resp:  18 18 17 17   Temp: 98.9 F (37.2 C) 98.9 F (37.2 C) 97.9 F (36.6 C) 98.7 F (37.1 C)  TempSrc:  Oral    SpO2: 100% 99% 97% 100%  Weight: 79.7 kg     Height: 5\' 2"  (1.575 m)       Intake/Output Summary (Last 24 hours) at 02/10/2021 0746 Last data filed at 02/10/2021  0459 Gross per 24 hour  Intake 1504.85 ml  Output --  Net 1504.85 ml   Filed Weights   02/09/21 1451  Weight: 79.7 kg    Examination:  General exam: Overall comfortable, not in distress HEENT: PERRL Respiratory system:  no wheezes or crackles  Cardiovascular system: S1 & S2 heard, RRR.  [place] Gastrointestinal system: Abdomen is nondistended, soft and is tender in the right upper quadrant. Central nervous system: Alert and oriented Extremities: No edema, no clubbing ,no cyanosis Skin: No rashes, no ulcers,no icterus      Data Reviewed: I have personally reviewed following labs and imaging studies  CBC: Recent Labs  Lab 02/09/21 1102 02/10/21 0357  WBC 2.8* 2.2*  NEUTROABS 1.3*  --   HGB 12.4 10.3*  HCT 36.7 30.7*  MCV 98.7 100.7*  PLT 97* 78*   Basic Metabolic Panel: Recent Labs  Lab 02/09/21 1102 02/10/21 0357  NA 137 135  K 3.8 3.8  CL 100 102  CO2 25 27  GLUCOSE 85 102*  BUN 11 10  CREATININE 1.19* 1.14*  CALCIUM 8.8* 7.9*   GFR: Estimated Creatinine Clearance: 69.2 mL/min (A) (by C-G formula based on SCr of 1.14 mg/dL (H)). Liver Function Tests: Recent Labs  Lab 02/09/21 1102 02/10/21 0357  AST 336* 269*  ALT 151* 112*  ALKPHOS 363* 253*  BILITOT 5.0* 4.0*  PROT 7.8 6.5  ALBUMIN 3.4* 3.1*   No results for input(s): LIPASE, AMYLASE in the last 168 hours. No results for input(s): AMMONIA in the last 168 hours. Coagulation Profile: No results for input(s): INR, PROTIME in the last 168 hours. Cardiac Enzymes: No results for input(s): CKTOTAL, CKMB, CKMBINDEX, TROPONINI in the last 168 hours. BNP (last 3 results) No results for input(s): PROBNP in the last 8760 hours. HbA1C: No results for input(s): HGBA1C in the last 72 hours. CBG: No results for input(s): GLUCAP in the last 168 hours. Lipid Profile: No results for input(s): CHOL, HDL, LDLCALC, TRIG, CHOLHDL, LDLDIRECT in the last 72 hours. Thyroid Function Tests: No results for  input(s): TSH, T4TOTAL, FREET4, T3FREE, THYROIDAB in the last 72 hours. Anemia Panel: No results for input(s): VITAMINB12, FOLATE, FERRITIN, TIBC, IRON, RETICCTPCT in the last 72 hours. Sepsis Labs: No results for input(s): PROCALCITON, LATICACIDVEN in the last 168 hours.  Recent Results (from the past 240 hour(s))  Resp Panel by RT-PCR (Flu A&B, Covid) Nasopharyngeal Swab     Status: None   Collection Time: 02/09/21  1:51 PM   Specimen: Nasopharyngeal Swab; Nasopharyngeal(NP) swabs in vial transport medium  Result Value Ref Range Status   SARS Coronavirus 2 by RT PCR NEGATIVE NEGATIVE Final    Comment: (NOTE) SARS-CoV-2 target nucleic acids are NOT DETECTED.  The SARS-CoV-2 RNA is generally detectable in upper respiratory specimens during the acute phase of infection. The lowest concentration of SARS-CoV-2 viral copies this assay can detect is 138 copies/mL. A negative result does not preclude SARS-Cov-2 infection and should not be used as the sole basis for treatment or other patient management decisions. A negative result may occur with  improper specimen collection/handling, submission  of specimen other than nasopharyngeal swab, presence of viral mutation(s) within the areas targeted by this assay, and inadequate number of viral copies(<138 copies/mL). A negative result must be combined with clinical observations, patient history, and epidemiological information. The expected result is Negative.  Fact Sheet for Patients:  EntrepreneurPulse.com.au  Fact Sheet for Healthcare Providers:  IncredibleEmployment.be  This test is no t yet approved or cleared by the [place] FDA and  has been authorized for detection and/or diagnosis of SARS-CoV-2 by FDA under an Emergency Use Authorization (EUA). This EUA will remain  in effect (meaning this test can be used) for the duration of the COVID-19 declaration under Section 564(b)(1) of the Act,  21 U.S.C.section 360bbb-3(b)(1), unless the authorization is terminated  or revoked sooner.       Influenza A by PCR NEGATIVE NEGATIVE Final   Influenza B by PCR NEGATIVE NEGATIVE Final    Comment: (NOTE) The Xpert Xpress SARS-CoV-2/FLU/RSV plus assay is intended as an aid in the diagnosis of influenza from Nasopharyngeal swab specimens and should not be used as a sole basis for treatment. Nasal washings and aspirates are unacceptable for Xpert Xpress SARS-CoV-2/FLU/RSV testing.  Fact Sheet for Patients: EntrepreneurPulse.com.au  Fact Sheet for Healthcare Providers: IncredibleEmployment.be  This test is not yet approved or cleared by the [place] FDA and has been authorized for detection and/or diagnosis of SARS-CoV-2 by FDA under an Emergency Use Authorization (EUA). This EUA will remain in effect (meaning this test can be used) for the duration of the COVID-19 declaration under Section 564(b)(1) of the Act, 21 U.S.C. section 360bbb-3(b)(1), unless the authorization is terminated or revoked.  Performed at [facility], [place] [address], [place], [place] [address]          Radiology Studies: MR 3D Recon At Scanner  Result Date: 02/09/2021 CLINICAL DATA:  History of breast cancer.  Jaundice. EXAM: MRI ABDOMEN WITHOUT AND WITH CONTRAST (INCLUDING MRCP) TECHNIQUE: Multiplanar multisequence MR imaging of the abdomen was performed both before and after the administration of intravenous contrast. Heavily T2-weighted images of the biliary and pancreatic ducts were obtained, and three-dimensional MRCP images were rendered by post processing. CONTRAST:  8.72mL GADAVIST GADOBUTROL 1 MMOL/ML IV SOLN COMPARISON:  [facility] CT of 02/07/2021. FINDINGS: Lower chest: Normal heart size without pericardial or pleural effusion. Hepatobiliary: Diffuse hepatic metastasis, with lesions replacing nearly the entire hepatic parenchyma on  the order of 1.3 cm. Normal gallbladder. No intra or extrahepatic biliary duct dilatation. No obstructive stone or mass. Pancreas:  Normal, without mass or ductal dilatation. Spleen:  Normal in size, without focal abnormality. Adrenals/Urinary Tract: Normal adrenal glands. Too small to characterize lesions in both kidneys. No hydronephrosis. Stomach/Bowel: Normal stomach and abdominal bowel loops. Vascular/Lymphatic: Normal caliber of the aorta and branch vessels. No retroperitoneal or retrocrural adenopathy. Other:  No ascites.  No evidence of omental or peritoneal disease. Musculoskeletal: Diffuse osseous metastasis, as have been detailed previously. IMPRESSION: 1. Since 07/15/2020, development of diffuse hepatic metastasis, nearly completely replacing the hepatic parenchyma. 2. No biliary duct dilatation or alternate explanation for jaundice. 3. Diffuse osseous metastasis, as detailed previously. Electronically Signed   By: Abigail Miyamoto M.D.   On: 02/09/2021 21:10   MR ABDOMEN MRCP W WO CONTAST  Result Date: 02/09/2021 CLINICAL DATA:  History of breast cancer.  Jaundice. EXAM: MRI ABDOMEN WITHOUT AND WITH CONTRAST (INCLUDING MRCP) TECHNIQUE: Multiplanar multisequence MR imaging of the abdomen was performed both before and after the administration of intravenous contrast.  Heavily T2-weighted images of the biliary and pancreatic ducts were obtained, and three-dimensional MRCP images were rendered by post processing. CONTRAST:  8.30mL GADAVIST GADOBUTROL 1 MMOL/ML IV SOLN COMPARISON:  [facility] CT of 02/07/2021. FINDINGS: Lower chest: Normal heart size without pericardial or pleural effusion. Hepatobiliary: Diffuse hepatic metastasis, with lesions replacing nearly the entire hepatic parenchyma on the order of 1.3 cm. Normal gallbladder. No intra or extrahepatic biliary duct dilatation. No obstructive stone or mass. Pancreas:  Normal, without mass or ductal dilatation. Spleen:  Normal in size, without focal  abnormality. Adrenals/Urinary Tract: Normal adrenal glands. Too small to characterize lesions in both kidneys. No hydronephrosis. Stomach/Bowel: Normal stomach and abdominal bowel loops. Vascular/Lymphatic: Normal caliber of the aorta and branch vessels. No retroperitoneal or retrocrural adenopathy. Other:  No ascites.  No evidence of omental or peritoneal disease. Musculoskeletal: Diffuse osseous metastasis, as have been detailed previously. IMPRESSION: 1. Since 07/15/2020, development of diffuse hepatic metastasis, nearly completely replacing the hepatic parenchyma. 2. No biliary duct dilatation or alternate explanation for jaundice. 3. Diffuse osseous metastasis, as detailed previously. Electronically Signed   By: Abigail Miyamoto M.D.   On: 02/09/2021 21:10        Scheduled Meds:  anastrozole  1 mg Oral Daily   palbociclib  100 mg Oral Daily   Continuous Infusions:  sodium chloride Stopped (02/10/21 0457)     LOS: 0 days    Time spent: 25 mins,More than 50% of that time was spent in counseling and/or coordination of care.      Shelly Coss, MD [facility] P12/04/2020, 7:46 AM

## 2021-02-10 NOTE — Progress Notes (Signed)
MEDICATION-RELATED CONSULT NOTE   IR Procedure Consult - Anticoagulant/Antiplatelet PTA/Inpatient Med List Review by Pharmacist    Procedure: US guided liver lesion biopsy    Completed: 12/2 at ~11:11  Post-Procedural bleeding risk per IR MD assessment:  standard  Antithrombotic medications on inpatient or PTA profile prior to procedure:    Lovenox 40mg  SQ q24  Recommended restart time per IR Post-Procedure Guidelines:  Tomorrow 12/3 AM   Other considerations:      Plan:    Restart Lovenox 40mg  SQ q24 tomorrow 12/3 [place], PharmD, BCPS Secure Chat if ?s 02/10/2021 11:29 AM

## 2021-02-10 NOTE — H&P (Signed)
Chief Complaint: Patient was seen in consultation today for image guided liver lesion biopsy at the request of Dr. Tamsen Meek  Referring Physician(s): Dr. Shelly Coss  Supervising Physician: Mir, Sharen Heck  Patient Status: [facility] - In-pt  History of Present Illness: Melissa [name] is a 30 y.o. female with PMH of anemia, CHF and breast cancer.  Patient was sent to the emergency department from the cancer center due to abnormal liver function test and right-sided abdominal pain.  Imaging of abdomen on 02/07/2021 showed diffuse hepatic masses throughout the liver concerning for metastatic disease.  Dr. Tamsen Meek has referred patient to IR for liver lesion biopsy which was approved by Dr. Dwaine Gale.  Past Medical History:  Diagnosis Date   Abnormal Pap smear 07/13/11   ASCUS +HPV   Anemia    CHF (congestive heart failure) ([place])    Family history of breast cancer    Family history of colon cancer    Family history of throat cancer    Fracture of distal phalanx of finger 10/05/2019   Right thumb and middle finger   Herpes simplex without mention of complication    dx age 50   left breast ca dx'd 02/26/19   breast cancer    Past Surgical History:  Procedure Laterality Date   BREAST BIOPSY Left 03/18/2019   Procedure: LEFT BREAST SKIN PUNCH BIOPSY;  Surgeon: Rolm Bookbinder, MD;  Location: [place];  Service: General;  Laterality: Left;   COLPOSCOPY     CIN I ECC -   MASTECTOMY Bilateral 10/08/2019   BILATERAL MASTECTOMY WITH LEFT AXILLARY SENTINEL LYMPH NODE BIOPSY (Bilateral Breast)    MASTECTOMY W/ SENTINEL NODE BIOPSY Bilateral 10/08/2019   Procedure: BILATERAL MASTECTOMY WITH LEFT AXILLARY SENTINEL LYMPH NODE BIOPSY;  Surgeon: Rolm Bookbinder, MD;  Location: [place];  Service: General;  Laterality: Bilateral;  BILATERAL PEC BLOCK   NO PAST SURGERIES     PORTACATH PLACEMENT N/A 03/18/2019   Procedure: INSERTION PORT-A-CATH WITH ULTRASOUND GUIDANCE;  Surgeon: Rolm Bookbinder, MD;  Location: [place];  Service: General;  Laterality: N/A;   RADIOACTIVE SEED GUIDED AXILLARY SENTINEL LYMPH NODE Left 10/08/2019   Procedure: LEFT RADIOACTIVE SEED GUIDED AXILLARY SENTINEL LYMPH NODE EXCISION;  Surgeon: Rolm Bookbinder, MD;  Location: [place];  Service: General;  Laterality: Left;  PEC BLOCK    Allergies: Patient has no known allergies.  Medications: Prior to Admission medications   Medication Sig Start Date End Date Taking? Authorizing Provider  anastrozole (ARIMIDEX) 1 MG tablet TAKE 1 TABLET(1 MG) BY MOUTH DAILY Patient taking differently: Take 1 mg by mouth daily. 01/16/21  Yes Nicholas Lose, MD  oxyCODONE (OXY IR/ROXICODONE) 5 MG immediate release tablet Take 1 tablet (5 mg total) by mouth every 4 (four) hours as needed for severe pain. 02/09/21  Yes Nicholas Lose, MD  palbociclib (IBRANCE) 100 MG tablet TAKE 1 TABLET BY MOUTH DAILY. TAKE FOR 21 DAYS ON, 7 DAYS OFF, REPEAT EVERY 28 DAYS. Patient taking differently: 100 mg as directed. 11/22/20 11/22/21 Yes Nicholas Lose, MD  ondansetron (ZOFRAN-ODT) 4 MG disintegrating tablet Take 1 tablet (4 mg total) by mouth every 8 (eight) hours as needed for nausea or vomiting. 02/09/21   Nicholas Lose, MD     Family History  Problem Relation Age of Onset   Breast cancer Paternal Grandmother        dx. 78s or younger   Stroke Father 55   Hypertension Father    Cancer Maternal Aunt  unknown type, dx. late 8s   Colon cancer Paternal Uncle        dx 40s   Throat cancer Maternal Grandmother        dx. 19s   Cancer Paternal Uncle        unknown type, dx. 2s   Breast cancer Cousin        dx. 84s, paternal 1st cousin    Social History   Socioeconomic History   Marital status: Single    Spouse name: Not on file   Number of children: Not on file   Years of education: Not on file   Highest education level: Not on file  Occupational History   Not on file  Tobacco Use   Smoking status: Former    Types: Cigarettes    Quit date:  02/27/2019    Years since quitting: 1.9   Smokeless tobacco: Never  Vaping Use   Vaping Use: Never used  Substance and Sexual Activity   Alcohol use: Not Currently   Drug use: Yes    Frequency: 2.0 times per week    Types: Marijuana    Comment: x1 weekly   Sexual activity: Yes    Birth control/protection: None  Other Topics Concern   Not on file  Social History Narrative   Not on file   Social Determinants of Health   Financial Resource Strain: Not on file  Food Insecurity: Not on file  Transportation Needs: Not on file  Physical Activity: Not on file  Stress: Not on file  Social Connections: Not on file    Review of Systems: A 12 point ROS discussed and pertinent positives are indicated in the HPI above.  All other systems are negative.  Review of Systems  Constitutional:  Negative for chills and fever.  HENT:  Negative for nosebleeds.   Eyes:  Negative for visual disturbance.  Respiratory:  Positive for cough. Negative for shortness of breath.   Cardiovascular:  Negative for chest pain and leg swelling.  Gastrointestinal:  Positive for abdominal pain and nausea. Negative for blood in stool and vomiting.  Genitourinary:  Negative for hematuria.  Neurological:  Negative for dizziness, light-headedness and headaches.   Vital Signs: BP 102/62 (BP Location: Left Arm)   Pulse 91   Temp 98.7 F (37.1 C)   Resp 17   Ht 5\' 2"  (1.575 m)   Wt 175 lb 11.2 oz (79.7 kg)   SpO2 100%   BMI 32.14 kg/m   Physical Exam Constitutional:      Appearance: Normal appearance.  HENT:     Head: Normocephalic and atraumatic.     Mouth/Throat:     Mouth: Mucous membranes are moist.     Pharynx: Oropharynx is clear.  Cardiovascular:     Rate and Rhythm: Regular rhythm. Tachycardia present.     Pulses: Normal pulses.     Heart sounds: Normal heart sounds. No murmur heard.   No friction rub. No gallop.  Pulmonary:     Effort: Pulmonary effort is normal. No respiratory distress.      Breath sounds: Normal breath sounds. No stridor. No wheezing, rhonchi or rales.  Abdominal:     General: Bowel sounds are normal.     Palpations: Abdomen is soft.     Tenderness: There is abdominal tenderness. There is no guarding.  Musculoskeletal:     Right lower leg: No edema.     Left lower leg: No edema.  Skin:    General: Skin is  warm and dry.     Comments: Port-A-Cath to right upper chest  Neurological:     Mental Status: She is alert and oriented to person, place, and time.  Psychiatric:        Mood and Affect: Mood normal.        Behavior: Behavior normal.        Thought Content: Thought content normal.        Judgment: Judgment normal.    Imaging: MR 3D Recon At Scanner  Result Date: 02/09/2021 CLINICAL DATA:  History of breast cancer.  Jaundice. EXAM: MRI ABDOMEN WITHOUT AND WITH CONTRAST (INCLUDING MRCP) TECHNIQUE: Multiplanar multisequence MR imaging of the abdomen was performed both before and after the administration of intravenous contrast. Heavily T2-weighted images of the biliary and pancreatic ducts were obtained, and three-dimensional MRCP images were rendered by post processing. CONTRAST:  8.56mL GADAVIST GADOBUTROL 1 MMOL/ML IV SOLN COMPARISON:  [facility] CT of 02/07/2021. FINDINGS: Lower chest: Normal heart size without pericardial or pleural effusion. Hepatobiliary: Diffuse hepatic metastasis, with lesions replacing nearly the entire hepatic parenchyma on the order of 1.3 cm. Normal gallbladder. No intra or extrahepatic biliary duct dilatation. No obstructive stone or mass. Pancreas:  Normal, without mass or ductal dilatation. Spleen:  Normal in size, without focal abnormality. Adrenals/Urinary Tract: Normal adrenal glands. Too small to characterize lesions in both kidneys. No hydronephrosis. Stomach/Bowel: Normal stomach and abdominal bowel loops. Vascular/Lymphatic: Normal caliber of the aorta and branch vessels. No retroperitoneal or retrocrural adenopathy.  Other:  No ascites.  No evidence of omental or peritoneal disease. Musculoskeletal: Diffuse osseous metastasis, as have been detailed previously. IMPRESSION: 1. Since 07/15/2020, development of diffuse hepatic metastasis, nearly completely replacing the hepatic parenchyma. 2. No biliary duct dilatation or alternate explanation for jaundice. 3. Diffuse osseous metastasis, as detailed previously. Electronically Signed   By: Abigail Miyamoto M.D.   On: 02/09/2021 21:10   MR ABDOMEN MRCP W WO CONTAST  Result Date: 02/09/2021 CLINICAL DATA:  History of breast cancer.  Jaundice. EXAM: MRI ABDOMEN WITHOUT AND WITH CONTRAST (INCLUDING MRCP) TECHNIQUE: Multiplanar multisequence MR imaging of the abdomen was performed both before and after the administration of intravenous contrast. Heavily T2-weighted images of the biliary and pancreatic ducts were obtained, and three-dimensional MRCP images were rendered by post processing. CONTRAST:  8.38mL GADAVIST GADOBUTROL 1 MMOL/ML IV SOLN COMPARISON:  [facility] CT of 02/07/2021. FINDINGS: Lower chest: Normal heart size without pericardial or pleural effusion. Hepatobiliary: Diffuse hepatic metastasis, with lesions replacing nearly the entire hepatic parenchyma on the order of 1.3 cm. Normal gallbladder. No intra or extrahepatic biliary duct dilatation. No obstructive stone or mass. Pancreas:  Normal, without mass or ductal dilatation. Spleen:  Normal in size, without focal abnormality. Adrenals/Urinary Tract: Normal adrenal glands. Too small to characterize lesions in both kidneys. No hydronephrosis. Stomach/Bowel: Normal stomach and abdominal bowel loops. Vascular/Lymphatic: Normal caliber of the aorta and branch vessels. No retroperitoneal or retrocrural adenopathy. Other:  No ascites.  No evidence of omental or peritoneal disease. Musculoskeletal: Diffuse osseous metastasis, as have been detailed previously. IMPRESSION: 1. Since 07/15/2020, development of diffuse hepatic  metastasis, nearly completely replacing the hepatic parenchyma. 2. No biliary duct dilatation or alternate explanation for jaundice. 3. Diffuse osseous metastasis, as detailed previously. Electronically Signed   By: Abigail Miyamoto M.D.   On: 02/09/2021 21:10    Labs:  CBC: Recent Labs    11/04/20 1027 12/05/20 1100 02/09/21 1102 02/10/21 0357  WBC 2.7* 3.0* 2.8* 2.2*  HGB 12.5 12.4 12.4 10.3*  HCT 36.9 36.4 36.7 30.7*  PLT 163 218 97* 78*    COAGS: Recent Labs    02/10/21 0837  INR 1.2    BMP: Recent Labs    11/04/20 1027 12/05/20 1100 02/09/21 1102 02/10/21 0357  NA 141 141 137 135  K 4.1 3.8 3.8 3.8  CL 106 106 100 102  CO2 26 26 25 27   GLUCOSE 69* 86 85 102*  BUN 13 9 11 10   CALCIUM 9.4 9.2 8.8* 7.9*  CREATININE 1.02* 0.94 1.19* 1.14*  GFRNONAA >60 >60 >60 >60    LIVER FUNCTION TESTS: Recent Labs    11/04/20 1027 12/05/20 1100 02/09/21 1102 02/10/21 0357  BILITOT 0.4 0.5 5.0* 4.0*  AST 22 43* 336* 269*  ALT 20 28 151* 112*  ALKPHOS 54 61 363* 253*  PROT 7.6 7.9 7.8 6.5  ALBUMIN 4.1 4.1 3.4* 3.1*    TUMOR MARKERS: No results for input(s): AFPTM, CEA, CA199, CHROMGRNA in the last 8760 hours.  Assessment and Plan: History of anemia, CHF and breast cancer.  Patient was sent to the emergency department from the cancer center due to abnormal liver function test and right-sided abdominal pain.  Imaging of abdomen on 02/07/2021 showed diffuse hepatic masses throughout the liver concerning for metastatic disease.  Dr. Tamsen Meek has referred patient to IR for liver lesion biopsy which was approved by Dr. Dwaine Gale.  Pt resting quietly in bed. She is is A&O, calm and pleasant. She is in no distress.  Pt states she is NPO per order.  Patient is neutropenic with WBC of 2.2 Today's labs within normal limits for procedure VSS.   Risks and benefits of which image guided liver lesion biopsy was discussed with the patient and/or patient's family including, but not limited  to bleeding, infection, damage to adjacent structures or low yield requiring additional tests.  All of the questions were answered and there is agreement to proceed.  Consent signed and in chart.   Thank you for this interesting consult.  I greatly enjoyed meeting Pachia Strum and look forward to participating in their care.  A copy of this report was sent to the requesting provider on this date.  Electronically Signed: Tyson Alias, NP 02/10/2021, 10:38 AM   I spent a total of 30 minutes in face to face in clinical consultation, greater than 50% of which was counseling/coordinating care for liver lesion biopsy.

## 2021-02-11 DIAGNOSIS — Z808 Family history of malignant neoplasm of other organs or systems: Secondary | ICD-10-CM | POA: Diagnosis not present

## 2021-02-11 DIAGNOSIS — Z9013 Acquired absence of bilateral breasts and nipples: Secondary | ICD-10-CM | POA: Diagnosis not present

## 2021-02-11 DIAGNOSIS — R1011 Right upper quadrant pain: Secondary | ICD-10-CM | POA: Diagnosis present

## 2021-02-11 DIAGNOSIS — Z87891 Personal history of nicotine dependence: Secondary | ICD-10-CM | POA: Diagnosis not present

## 2021-02-11 DIAGNOSIS — E669 Obesity, unspecified: Secondary | ICD-10-CM | POA: Diagnosis present

## 2021-02-11 DIAGNOSIS — Z853 Personal history of malignant neoplasm of breast: Secondary | ICD-10-CM | POA: Diagnosis not present

## 2021-02-11 DIAGNOSIS — Z803 Family history of malignant neoplasm of breast: Secondary | ICD-10-CM | POA: Diagnosis not present

## 2021-02-11 DIAGNOSIS — C7951 Secondary malignant neoplasm of bone: Secondary | ICD-10-CM | POA: Diagnosis present

## 2021-02-11 DIAGNOSIS — Z170 Estrogen receptor positive status [ER+]: Secondary | ICD-10-CM | POA: Diagnosis not present

## 2021-02-11 DIAGNOSIS — Z79899 Other long term (current) drug therapy: Secondary | ICD-10-CM | POA: Diagnosis not present

## 2021-02-11 DIAGNOSIS — D61818 Other pancytopenia: Secondary | ICD-10-CM | POA: Diagnosis present

## 2021-02-11 DIAGNOSIS — I509 Heart failure, unspecified: Secondary | ICD-10-CM | POA: Diagnosis present

## 2021-02-11 DIAGNOSIS — C787 Secondary malignant neoplasm of liver and intrahepatic bile duct: Secondary | ICD-10-CM | POA: Diagnosis present

## 2021-02-11 DIAGNOSIS — Z6832 Body mass index (BMI) 32.0-32.9, adult: Secondary | ICD-10-CM | POA: Diagnosis not present

## 2021-02-11 DIAGNOSIS — N179 Acute kidney failure, unspecified: Secondary | ICD-10-CM | POA: Diagnosis present

## 2021-02-11 DIAGNOSIS — Z8249 Family history of ischemic heart disease and other diseases of the circulatory system: Secondary | ICD-10-CM | POA: Diagnosis not present

## 2021-02-11 DIAGNOSIS — Z823 Family history of stroke: Secondary | ICD-10-CM | POA: Diagnosis not present

## 2021-02-11 DIAGNOSIS — C50812 Malignant neoplasm of overlapping sites of left female breast: Secondary | ICD-10-CM | POA: Diagnosis present

## 2021-02-11 DIAGNOSIS — Z20822 Contact with and (suspected) exposure to covid-19: Secondary | ICD-10-CM | POA: Diagnosis present

## 2021-02-11 DIAGNOSIS — Z800 Family history of malignant neoplasm of digestive organs: Secondary | ICD-10-CM | POA: Diagnosis not present

## 2021-02-11 LAB — CBC WITH DIFFERENTIAL/PLATELET
Abs Immature Granulocytes: 0.03 10*3/uL (ref 0.00–0.07)
Basophils Absolute: 0 10*3/uL (ref 0.0–0.1)
Basophils Relative: 1 %
Eosinophils Absolute: 0 10*3/uL (ref 0.0–0.5)
Eosinophils Relative: 1 %
HCT: 31 % — ABNORMAL LOW (ref 36.0–46.0)
Hemoglobin: 10.1 g/dL — ABNORMAL LOW (ref 12.0–15.0)
Immature Granulocytes: 2 %
Lymphocytes Relative: 49 %
Lymphs Abs: 1 10*3/uL (ref 0.7–4.0)
MCH: 33.7 pg (ref 26.0–34.0)
MCHC: 32.6 g/dL (ref 30.0–36.0)
MCV: 103.3 fL — ABNORMAL HIGH (ref 80.0–100.0)
Monocytes Absolute: 0.1 10*3/uL (ref 0.1–1.0)
Monocytes Relative: 7 %
Neutro Abs: 0.8 10*3/uL — ABNORMAL LOW (ref 1.7–7.7)
Neutrophils Relative %: 40 %
Platelets: 65 10*3/uL — ABNORMAL LOW (ref 150–400)
RBC: 3 MIL/uL — ABNORMAL LOW (ref 3.87–5.11)
RDW: 17 % — ABNORMAL HIGH (ref 11.5–15.5)
WBC: 2 10*3/uL — ABNORMAL LOW (ref 4.0–10.5)
nRBC: 17.2 % — ABNORMAL HIGH (ref 0.0–0.2)

## 2021-02-11 LAB — COMPREHENSIVE METABOLIC PANEL
ALT: 110 U/L — ABNORMAL HIGH (ref 0–44)
AST: 311 U/L — ABNORMAL HIGH (ref 15–41)
Albumin: 2.8 g/dL — ABNORMAL LOW (ref 3.5–5.0)
Alkaline Phosphatase: 232 U/L — ABNORMAL HIGH (ref 38–126)
Anion gap: 6 (ref 5–15)
BUN: 9 mg/dL (ref 6–20)
CO2: 23 mmol/L (ref 22–32)
Calcium: 7.5 mg/dL — ABNORMAL LOW (ref 8.9–10.3)
Chloride: 105 mmol/L (ref 98–111)
Creatinine, Ser: 0.99 mg/dL (ref 0.44–1.00)
GFR, Estimated: >60 mL/min (ref >60)
Glucose, Bld: 81 mg/dL (ref 70–99)
Potassium: 4.2 mmol/L (ref 3.5–5.1)
Sodium: 134 mmol/L — ABNORMAL LOW (ref 135–145)
Total Bilirubin: 4.4 mg/dL — ABNORMAL HIGH (ref 0.3–1.2)
Total Protein: 6 g/dL — ABNORMAL LOW (ref 6.5–8.1)

## 2021-02-11 MED ORDER — POLYETHYLENE GLYCOL 3350 17 G PO PACK: 17.0000 g | PACK | Freq: Every day | ORAL | 0 refills | Status: DC

## 2021-02-11 NOTE — Discharge Summary (Signed)
Physician Discharge Summary  Jara Feider OVF:[id number] DOB: [birth year] DOA: 02/09/2021  PCP: [name], NP  Admit date: 02/09/2021 Discharge date: 02/11/2021  Admitted From: Home Disposition:  Home  Discharge Condition:Stable CODE STATUS:FULL Diet recommendation: regular   Brief/Interim Summary: Melissa [name] is a 30 y.o. 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  She has history of left-sided breast cancer, follows with oncology. MRI done on this admission confirmed hepatic metastasis with almost no parenchyma left.  No evidence of biliary obstruction or other or any other explanation for jaundice.  Underwent  liver biopsy on 02/10/21.  Her abdomen pain, nausea has been significantly better.  She is medically stable for discharge to home today.  She will follow-up with her oncologist as an outpatient.  Following problems were addressed during hospitalization:  Abdominal pain: Complained of right upper quadrant abdominal discomfort along with nausea.  This is most likely secondary to hepatic metastasis .she has tender hepatomegaly .  Abdomen pain is much better today.     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  Follows with oncology.  Status post bilateral mastectomy, radiation treatment, currently on antiestrogen oral therapy: Letrozole, Faslodex, Ibrance plus Zoladex along with Xgeva.  Bone scan had shown bony metastasis now with finding of hepatic metastasis. MRI done on this admission confirmed hepatic metastasis with almost no parenchyma left.  No evidence of biliary obstruction or other or any other explanation for jaundice.   Oncology recommended  liver biopsy to run prognostic panel as well as molecular testing that would help with for  consideration of change in the medical treatment.  Underwent liver biopsy.  Biopsy reviewed followed by Dr. Lindi Adie.   Elevated liver enzymes: Secondary to hepatic metastasis. MRCP did not show biliary obstruction.   Check CMP in a week   Pancytopenia: This is secondary to malignancy.  Continue to monitor the levels and outpatient   Mild AKI: Resolved with IV fluids   Obesity: BMI of 32.1  Discharge Diagnoses:  Principal Problem:   Abdominal pain Active Problems:   Malignant neoplasm of overlapping sites of left breast in female, estrogen receptor positive ([place])   Hepatic metastasis ([facility])   Elevated liver enzymes    Discharge Instructions  Discharge Instructions     Diet general   Complete by: As directed    Discharge instructions   Complete by: As directed    1)Please follow up with your oncologist in 1-2 weeks.Do a CBC and CMP test during the follow up.   Increase activity slowly   Complete by: As directed    No wound care   Complete by: As directed       Allergies as of 02/11/2021   No Known Allergies      Medication List     TAKE these medications    anastrozole 1 MG tablet Commonly known as: ARIMIDEX TAKE 1 TABLET(1 MG) BY MOUTH DAILY What changed: See the new instructions.   Ibrance 100 MG tablet Generic drug: palbociclib TAKE 1 TABLET BY MOUTH DAILY. TAKE FOR 21 DAYS ON, 7 DAYS OFF, REPEAT EVERY 28 DAYS. What changed:  how much to take when to take this   ondansetron 4 MG disintegrating tablet Commonly known as: ZOFRAN-ODT Take 1 tablet (4 mg total) by mouth every 8 (eight) hours as needed for nausea  or vomiting.   oxyCODONE 5 MG immediate release tablet Commonly known as: Oxy IR/ROXICODONE Take 1 tablet (5 mg total) by mouth every 4 (four) hours as needed for severe pain.   polyethylene glycol 17 g packet Commonly known as: MiraLax Take 17 g by mouth daily.        Follow-up Information     Nicholas Lose, MD. Schedule an appointment as  soon as possible for a visit in 1 week(s).   Specialty: Hematology and Oncology Contact information: [place] [address] [phone]                No Known Allergies  Consultations: None   Procedures/Studies: MR 3D Recon At Scanner  Result Date: 02/09/2021 CLINICAL DATA:  History of breast cancer.  Jaundice. EXAM: MRI ABDOMEN WITHOUT AND WITH CONTRAST (INCLUDING MRCP) TECHNIQUE: Multiplanar multisequence MR imaging of the abdomen was performed both before and after the administration of intravenous contrast. Heavily T2-weighted images of the biliary and pancreatic ducts were obtained, and three-dimensional MRCP images were rendered by post processing. CONTRAST:  8.68mL GADAVIST GADOBUTROL 1 MMOL/ML IV SOLN COMPARISON:  [facility] CT of 02/07/2021. FINDINGS: Lower chest: Normal heart size without pericardial or pleural effusion. Hepatobiliary: Diffuse hepatic metastasis, with lesions replacing nearly the entire hepatic parenchyma on the order of 1.3 cm. Normal gallbladder. No intra or extrahepatic biliary duct dilatation. No obstructive stone or mass. Pancreas:  Normal, without mass or ductal dilatation. Spleen:  Normal in size, without focal abnormality. Adrenals/Urinary Tract: Normal adrenal glands. Too small to characterize lesions in both kidneys. No hydronephrosis. Stomach/Bowel: Normal stomach and abdominal bowel loops. Vascular/Lymphatic: Normal caliber of the aorta and branch vessels. No retroperitoneal or retrocrural adenopathy. Other:  No ascites.  No evidence of omental or peritoneal disease. Musculoskeletal: Diffuse osseous metastasis, as have been detailed previously. IMPRESSION: 1. Since 07/15/2020, development of diffuse hepatic metastasis, nearly completely replacing the hepatic parenchyma. 2. No biliary duct dilatation or alternate explanation for jaundice. 3. Diffuse osseous metastasis, as detailed previously. Electronically Signed   By:  Abigail Miyamoto M.D.   On: 02/09/2021 21:10   US BIOPSY (LIVER)  Result Date: 02/10/2021 INDICATION: 30 year old woman with history of breast cancer presents to IR for biopsy of new diffuse liver lesions. EXAM: Ultrasound-guided biopsy of left liver mass MEDICATIONS: None. ANESTHESIA/SEDATION: Moderate (conscious) sedation was employed during this procedure. A total of Versed 2 mg and Fentanyl 50 mcg was administered intravenously. Moderate Sedation Time: 10 minutes. The patient's level of consciousness and vital signs were monitored continuously by radiology nursing throughout the procedure under my direct supervision. COMPLICATIONS: None immediate. PROCEDURE: Informed written consent was obtained from the patient after a thorough discussion of the procedural risks, benefits and alternatives. All questions were addressed. Maximal Sterile Barrier Technique was utilized including caps, mask, sterile gowns, sterile gloves, sterile drape, hand hygiene and skin antiseptic. A timeout was performed prior to the initiation of the procedure. Patient position supine on the ultrasound table. Epigastric skin prepped and draped in usual sterile fashion. Following local lidocaine administration, 17 gauge introducer needle was advanced into the left hepatic lobe, and three 18 gauge cores were obtained utilizing continuous ultrasound guidance. Gelfoam slurry was administered through the introducer needle at the biopsy site. Samples were sent to pathology in formalin. Needle removed and hemostasis achieved with 5 minutes of manual compression. Post procedure ultrasound images showed no evidence of significant hemorrhage. IMPRESSION: Ultrasound-guided biopsy of left liver lesions. Electronically Signed   By:  Sharen Heck  Mir M.D.   On: 02/10/2021 11:31   MR ABDOMEN MRCP W WO CONTAST  Result Date: 02/09/2021 CLINICAL DATA:  History of breast cancer.  Jaundice. EXAM: MRI ABDOMEN WITHOUT AND WITH CONTRAST (INCLUDING MRCP) TECHNIQUE:  Multiplanar multisequence MR imaging of the abdomen was performed both before and after the administration of intravenous contrast. Heavily T2-weighted images of the biliary and pancreatic ducts were obtained, and three-dimensional MRCP images were rendered by post processing. CONTRAST:  8.91mL GADAVIST GADOBUTROL 1 MMOL/ML IV SOLN COMPARISON:  [facility] CT of 02/07/2021. FINDINGS: Lower chest: Normal heart size without pericardial or pleural effusion. Hepatobiliary: Diffuse hepatic metastasis, with lesions replacing nearly the entire hepatic parenchyma on the order of 1.3 cm. Normal gallbladder. No intra or extrahepatic biliary duct dilatation. No obstructive stone or mass. Pancreas:  Normal, without mass or ductal dilatation. Spleen:  Normal in size, without focal abnormality. Adrenals/Urinary Tract: Normal adrenal glands. Too small to characterize lesions in both kidneys. No hydronephrosis. Stomach/Bowel: Normal stomach and abdominal bowel loops. Vascular/Lymphatic: Normal caliber of the aorta and branch vessels. No retroperitoneal or retrocrural adenopathy. Other:  No ascites.  No evidence of omental or peritoneal disease. Musculoskeletal: Diffuse osseous metastasis, as have been detailed previously. IMPRESSION: 1. Since 07/15/2020, development of diffuse hepatic metastasis, nearly completely replacing the hepatic parenchyma. 2. No biliary duct dilatation or alternate explanation for jaundice. 3. Diffuse osseous metastasis, as detailed previously. Electronically Signed   By: Abigail Miyamoto M.D.   On: 02/09/2021 21:10      Subjective: Patient seen and examined at the bedside this morning.  Hemodynamically stable.  Overall comfortable.Abdomen pain is better today.  Stable  for discharge  Discharge Exam: Vitals:   02/11/21 0054 02/11/21 0510  BP: 109/72 112/75  Pulse: 97 (!) 101  Resp: 16 18  Temp: 99.1 F (37.3 C) 99.8 F (37.7 C)  SpO2: 99% 96%   Vitals:   02/10/21 1646 02/10/21 2057  02/11/21 0054 02/11/21 0510  BP: 114/68 102/69 109/72 112/75  Pulse: 92 91 97 (!) 101  Resp: 17 18 16 18   Temp: 98.5 F (36.9 C) 99.5 F (37.5 C) 99.1 F (37.3 C) 99.8 F (37.7 C)  TempSrc: Oral Oral Oral Oral  SpO2: 100% 100% 99% 96%  Weight:      Height:        General: Pt is alert, awake, not in acute distress Cardiovascular: RRR, S1/S2 +, no rubs, no gallops Respiratory: CTA bilaterally, no wheezing, no rhonchi Abdominal: Soft, NT, tenderness on RUQ, bowel sounds + Extremities: no edema, no cyanosis    The results of significant diagnostics from this hospitalization (including imaging, microbiology, ancillary and laboratory) are listed below for reference.     Microbiology: Recent Results (from the past 240 hour(s))  Resp Panel by RT-PCR (Flu A&B, Covid) Nasopharyngeal Swab     Status: None   Collection Time: 02/09/21  1:51 PM   Specimen: Nasopharyngeal Swab; Nasopharyngeal(NP) swabs in vial transport medium  Result Value Ref Range Status   SARS Coronavirus 2 by RT PCR NEGATIVE NEGATIVE Final    Comment: (NOTE) SARS-CoV-2 target nucleic acids are NOT DETECTED.  The SARS-CoV-2 RNA is generally detectable in upper respiratory specimens during the acute phase of infection. The lowest concentration of SARS-CoV-2 viral copies this assay can detect is 138 copies/mL. A negative result does not preclude SARS-Cov-2 infection and should not be used as the sole basis for treatment or other patient management decisions. A negative result may occur with  improper specimen collection/handling, submission of specimen other than nasopharyngeal swab, presence of viral mutation(s) within the areas targeted by this assay, and inadequate number of viral copies(<138 copies/mL). A negative result must be combined with clinical observations, patient history, and epidemiological information. The expected result is Negative.  Fact Sheet for Patients:   EntrepreneurPulse.com.au  Fact Sheet for Healthcare Providers:  IncredibleEmployment.be  This test is no t yet approved or cleared by the [place] FDA and  has been authorized for detection and/or diagnosis of SARS-CoV-2 by FDA under an Emergency Use Authorization (EUA). This EUA will remain  in effect (meaning this test can be used) for the duration of the COVID-19 declaration under Section 564(b)(1) of the Act, 21 U.S.C.section 360bbb-3(b)(1), unless the authorization is terminated  or revoked sooner.       Influenza A by PCR NEGATIVE NEGATIVE Final   Influenza B by PCR NEGATIVE NEGATIVE Final    Comment: (NOTE) The Xpert Xpress SARS-CoV-2/FLU/RSV plus assay is intended as an aid in the diagnosis of influenza from Nasopharyngeal swab specimens and should not be used as a sole basis for treatment. Nasal washings and aspirates are unacceptable for Xpert Xpress SARS-CoV-2/FLU/RSV testing.  Fact Sheet for Patients: EntrepreneurPulse.com.au  Fact Sheet for Healthcare Providers: IncredibleEmployment.be  This test is not yet approved or cleared by the [place] FDA and has been authorized for detection and/or diagnosis of SARS-CoV-2 by FDA under an Emergency Use Authorization (EUA). This EUA will remain in effect (meaning this test can be used) for the duration of the COVID-19 declaration under Section 564(b)(1) of the Act, 21 U.S.C. section 360bbb-3(b)(1), unless the authorization is terminated or revoked.  Performed at [facility], [place] [address] [place], [place] [address]      Labs: BNP (last 3 results) Recent Labs    05/12/20 1141  BNP 709.6*   Basic Metabolic Panel: Recent Labs  Lab 02/09/21 1102 02/10/21 0357 02/11/21 0705  NA 137 135 134*  K 3.8 3.8 4.2  CL 100 102 105  CO2 25 27 23   GLUCOSE 85 102* 81  BUN 11 10 9   CREATININE 1.19* 1.14* 0.99  CALCIUM  8.8* 7.9* 7.5*   Liver Function Tests: Recent Labs  Lab 02/09/21 1102 02/10/21 0357 02/11/21 0705  AST 336* 269* 311*  ALT 151* 112* 110*  ALKPHOS 363* 253* 232*  BILITOT 5.0* 4.0* 4.4*  PROT 7.8 6.5 6.0*  ALBUMIN 3.4* 3.1* 2.8*   No results for input(s): LIPASE, AMYLASE in the last 168 hours. No results for input(s): AMMONIA in the last 168 hours. CBC: Recent Labs  Lab 02/09/21 1102 02/10/21 0357 02/11/21 0705  WBC 2.8* 2.2* 2.0*  NEUTROABS 1.3*  --  0.8*  HGB 12.4 10.3* 10.1*  HCT 36.7 30.7* 31.0*  MCV 98.7 100.7* 103.3*  PLT 97* 78* 65*   Cardiac Enzymes: No results for input(s): CKTOTAL, CKMB, CKMBINDEX, TROPONINI in the last 168 hours. BNP: Invalid input(s): POCBNP CBG: No results for input(s): GLUCAP in the last 168 hours. D-Dimer No results for input(s): DDIMER in the last 72 hours. Hgb A1c No results for input(s): HGBA1C in the last 72 hours. Lipid Profile No results for input(s): CHOL, HDL, LDLCALC, TRIG, CHOLHDL, LDLDIRECT in the last 72 hours. Thyroid function studies No results for input(s): TSH, T4TOTAL, T3FREE, THYROIDAB in the last 72 hours.  Invalid input(s): FREET3 Anemia work up No results for input(s): VITAMINB12, FOLATE, FERRITIN, TIBC, IRON, RETICCTPCT in the last 72 hours. Urinalysis  Component Value Date/Time   COLORURINE YELLOW 06/19/2010 1113   APPEARANCEUR CLEAR 06/19/2010 1113   LABSPEC 1.025 06/19/2010 1113   PHURINE 5.5 06/19/2010 1113   GLUCOSEU NEGATIVE 06/19/2010 1113   HGBUR NEGATIVE 06/19/2010 1113   BILIRUBINUR NEGATIVE 06/19/2010 1113   KETONESUR NEGATIVE 06/19/2010 1113   PROTEINUR NEGATIVE 06/19/2010 1113   UROBILINOGEN 0.2 06/19/2010 1113   NITRITE NEGATIVE 06/19/2010 1113   LEUKOCYTESUR SMALL (A) 06/19/2010 1113   Sepsis Labs Invalid input(s): PROCALCITONIN,  WBC,  LACTICIDVEN Microbiology Recent Results (from the past 240 hour(s))  Resp Panel by RT-PCR (Flu A&B, Covid) Nasopharyngeal Swab     Status: None    Collection Time: 02/09/21  1:51 PM   Specimen: Nasopharyngeal Swab; Nasopharyngeal(NP) swabs in vial transport medium  Result Value Ref Range Status   SARS Coronavirus 2 by RT PCR NEGATIVE NEGATIVE Final    Comment: (NOTE) SARS-CoV-2 target nucleic acids are NOT DETECTED.  The SARS-CoV-2 RNA is generally detectable in upper respiratory specimens during the acute phase of infection. The lowest concentration of SARS-CoV-2 viral copies this assay can detect is 138 copies/mL. A negative result does not preclude SARS-Cov-2 infection and should not be used as the sole basis for treatment or other patient management decisions. A negative result may occur with  improper specimen collection/handling, submission of specimen other than nasopharyngeal swab, presence of viral mutation(s) within the areas targeted by this assay, and inadequate number of viral copies(<138 copies/mL). A negative result must be combined with clinical observations, patient history, and epidemiological information. The expected result is Negative.  Fact Sheet for Patients:  EntrepreneurPulse.com.au  Fact Sheet for Healthcare Providers:  IncredibleEmployment.be  This test is no t yet approved or cleared by the [place] FDA and  has been authorized for detection and/or diagnosis of SARS-CoV-2 by FDA under an Emergency Use Authorization (EUA). This EUA will remain  in effect (meaning this test can be used) for the duration of the COVID-19 declaration under Section 564(b)(1) of the Act, 21 U.S.C.section 360bbb-3(b)(1), unless the authorization is terminated  or revoked sooner.       Influenza A by PCR NEGATIVE NEGATIVE Final   Influenza B by PCR NEGATIVE NEGATIVE Final    Comment: (NOTE) The Xpert Xpress SARS-CoV-2/FLU/RSV plus assay is intended as an aid in the diagnosis of influenza from Nasopharyngeal swab specimens and should not be used as a sole basis for treatment.  Nasal washings and aspirates are unacceptable for Xpert Xpress SARS-CoV-2/FLU/RSV testing.  Fact Sheet for Patients: EntrepreneurPulse.com.au  Fact Sheet for Healthcare Providers: IncredibleEmployment.be  This test is not yet approved or cleared by the [place] FDA and has been authorized for detection and/or diagnosis of SARS-CoV-2 by FDA under an Emergency Use Authorization (EUA). This EUA will remain in effect (meaning this test can be used) for the duration of the COVID-19 declaration under Section 564(b)(1) of the Act, 21 U.S.C. section 360bbb-3(b)(1), unless the authorization is terminated or revoked.  Performed at [facility], [place] [address], [place], [place] [address]     Please note: You were cared for by a hospitalist during your hospital stay. Once you are discharged, your primary care physician will handle any further medical issues. Please note that NO REFILLS for any discharge medications will be authorized once you are discharged, as it is imperative that you return to your primary care physician (or establish a relationship with a primary care physician if you do not have one) for your post hospital discharge  needs so that they can reassess your need for medications and monitor your lab values.    Time coordinating discharge: 40 minutes  SIGNED:   Shelly Coss, MD  [facility] 02/11/2021, 11:44 AM Pager [phone]  If 7PM-7AM, please contact night-coverage [email or web address] Password [email or web address]

## 2021-02-11 NOTE — Progress Notes (Signed)
Pt taken out to family member's car via w/c. NAD noted.

## 2021-02-13 ENCOUNTER — Other Ambulatory Visit (HOSPITAL_COMMUNITY): Payer: Self-pay

## 2021-02-13 ENCOUNTER — Inpatient Hospital Stay (HOSPITAL_BASED_OUTPATIENT_CLINIC_OR_DEPARTMENT_OTHER): Payer: Medicaid Other | Admitting: Hematology and Oncology

## 2021-02-13 DIAGNOSIS — C50812 Malignant neoplasm of overlapping sites of left female breast: Secondary | ICD-10-CM

## 2021-02-13 DIAGNOSIS — C50919 Malignant neoplasm of unspecified site of unspecified female breast: Secondary | ICD-10-CM | POA: Diagnosis not present

## 2021-02-13 DIAGNOSIS — Z170 Estrogen receptor positive status [ER+]: Secondary | ICD-10-CM

## 2021-02-13 MED ORDER — LIDOCAINE-PRILOCAINE 2.5-2.5 % EX CREA: TOPICAL_CREAM | CUTANEOUS | 3 refills | Status: DC

## 2021-02-13 MED ORDER — LORAZEPAM 0.5 MG PO TABS: 0.5000 mg | ORAL_TABLET | Freq: Every evening | ORAL | 3 refills | Status: DC | PRN

## 2021-02-13 MED ORDER — PROCHLORPERAZINE MALEATE 10 MG PO TABS: 10.0000 mg | ORAL_TABLET | Freq: Four times a day (QID) | ORAL | 1 refills | Status: DC | PRN

## 2021-02-13 MED ORDER — ONDANSETRON HCL 8 MG PO TABS: 8.0000 mg | ORAL_TABLET | Freq: Two times a day (BID) | ORAL | 1 refills | Status: DC | PRN

## 2021-02-13 NOTE — Progress Notes (Signed)
HEMATOLOGY-ONCOLOGY TELEPHONE VISIT PROGRESS NOTE  I connected with _0 @ on 02/13/21 at 12:30 PM EST by telephone and verified that I am speaking with the correct person using two identifiers.  I discussed the limitations, risks, security and privacy concerns of performing an evaluation and management service by telephone and the availability of in person appointments.  I also discussed with the patient that there may be a patient responsible charge related to this service. The patient expressed understanding and agreed to proceed.   History of Present Illness: Patient was recently hospitalized for severe jaundice and had a liver MRI which showed diffuse hepatic metastases.  There was not any area that could be stented.  She underwent a liver biopsy and was discharged home.  Telephone visit today is to discuss her treatment plan.  Oncology History  Malignant neoplasm of overlapping sites of left breast in female, estrogen receptor positive ([place])  02/26/2019 Initial Diagnosis   Pregnant lady with 80-monthhistory of left breast swelling and skin thickening, mammogram showed pleomorphic calcifications 8 cm, axilla lymph node biopsy positive ER 60%, PR 60%, Ki-67 50%, HER-2 negative ratio 1.25, anterior and posterior end of the calcifications biopsied: 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 ER 50%, PR 30%, Ki-67 50%, HER-2 negative ratio 1.03   03/04/2019 Cancer Staging   Staging form: Breast, AJCC 8th Edition - Clinical stage from 03/04/2019: Stage IIIB (cT4, cN1, cM0, G3, ER+, PR+, HER2-) - Signed by GNicholas Lose MD on 03/04/2019     Neo-Adjuvant Chemotherapy   Adriamycin and Cytoxan x 4 given every three weeks without Neulasta support., followed by weekly Taxol x 12 versus Taxotere Cytoxan after her delivery   04/15/2019 Genetic Testing   Negative genetic testing:  No pathogenic variants detected on the [employer] Breast Cancer Guidelines-Based Panel or the Common Hereditary Cancers  Panel. The report date is 04/15/2019.  The Breast Cancer Guidelines-Based panel offered by [employer] includes sequencing and rearrangement analysis for the following 11 genes:  ATM, BRCA1, BRCA2, CDH1, CHEK2, NBN, NF1, PALB2, PTEN, STK11 and TP53.  The Common Hereditary Cancers Panel offered by [facility] includes sequencing and/or deletion duplication testing of the following 48 genes: APC, ATM, AXIN2, BARD1, BMPR1A, BRCA1, BRCA2, BRIP1, CDH1, CDK4, CDKN2A (p14ARF), CDKN2A (p16INK4a), CHEK2, CTNNA1, DICER1, EPCAM (Deletion/duplication testing only), GREM1 (promoter region deletion/duplication testing only), KIT, MEN1, MLH1, MSH2, MSH3, MSH6, MUTYH, NBN, NF1, NHTL1, PALB2, PDGFRA, PMS2, POLD1, POLE, PTEN, RAD50, RAD51C, RAD51D, RNF43, SDHB, SDHC, SDHD, SMAD4, SMARCA4. STK11, TP53, TSC1, TSC2, and VHL.  The following genes were evaluated for sequence changes only: SDHA and HOXB13 c.251G>A variant only.    10/08/2019 Surgery   Bilateral mastectomies ([name]:  Right mastectomy: Multifocal grade 2 IDC 0.8 cm and 0.6 cm with high-grade DCIS, margins negative, 0/4 lymph nodes ER 95%, PR 5%, Ki-67 10%, HER-2 negative;  Left mastectomy: IDC 0.6 cm, LVI, margins negative, 1/4 lymph nodes positive, ER 60%, PR 60%, HER-2 negative, Ki-67 50% RCB class II   11/30/2019 - 01/08/2020 Radiation Therapy   Adjuvant radiation    Anti-estrogen oral therapy   Ibrance Letrozole, ZMeribeth Mattes  02/20/2021 -  Chemotherapy   Patient is on Treatment Plan : BREAST METASTATIC fam-trastuzumab deruxtecan-nxki (Enhertu) q21d     [place]  02/13/2021 Initial Diagnosis   [place]astatic breast cancer [place]   02/20/2021 -  Chemotherapy   Patient is on Treatment Plan : BREAST METASTATIC fam-trastuzumab deruxtecan-nxki (Enhertu) q21d       REVIEW OF SYSTEMS:  Constitutional: Jaundice, generalized fatigue and weakness   All other systems were reviewed with the patient and are negative. 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  Neoadjuvant chemotherapy with dose dense Adriamycin and Cytoxan given every 3 weeks without Neulasta support given her pregnancy completed 05/28/2019 2.  Post-Partum weekly Taxol x12 starting 07/03/2019 3.  Bilateral mastectomy 10/08/2019: Right mastectomy: Multifocal grade 2 IDC 0.8 cm and 0.6 cm with high-grade DCIS, margins negative, 0/4 lymph nodes ER 95%, PR 5%, Ki-67 10%, HER-2 negative; left mastectomy: IDC 0.6 cm, LVI, margins negative, 1/4 lymph nodes positive, ER 60%, PR 60%, HER-2 negative, Ki-67 50% RCB class II 4.  Adjuvant radiation therapy 5.  Followed by adjuvant antiestrogen therapy with complete estrogen blockade (ovarian suppression with AI) CT C/A/P on 5/13 shows sclerotic bone lesions, bone scan negative 6.  Declined CDK 4 and 6 inhibitor abemaciclib with antiestrogen therapy 7. Faslodex + letrozole + Ibrance plus Zoladex along with Xgeva ---------------------------------------------------------------------------------------------------------------------------------------------------- Bone scan 04/01/2020: Interval development of multifocal abnormal areas of increased tracer uptake compatible with diffuse bone metastases bony pelvis, right acetabulum, left pubic bone, anterior left lower rib, thoracic and upper lumbar spine including T6,  T9, L1, intertrochanteric region of right femur   03/31/2020: CT CAP: Bone metastases.  No visceral mets.   MRI liver 02/09/2021: Diffuse hepatic metastases nearly completely replacing the hepatic parenchyma, diffuse osseous metastases Ultrasound-guided biopsy of the liver was performed.  Start systemic treatment with Enhertu ([facility] 2+) starting 02/20/2021 Palliative care referral will need to be made. Patient was extremely tearful and sad on the telephone.  She wanted to know her prognosis.  I discussed with her that it could be dependent on the response to the next treatment.  We will start her at a lower dosage given her LFT abnormalities and then increase the dosage from second cycle onwards.   I discussed the assessment and treatment plan with the patient. The patient was provided an opportunity to ask questions and all were answered. The patient agreed with the plan and demonstrated an understanding of the instructions. The patient was advised to call back or seek an in-person evaluation if the symptoms worsen or if the condition fails to improve as anticipated.   I provided 15.vgscr  minutes of non-face-to-face time during this encounter. VHarriette Ohara MD

## 2021-02-13 NOTE — Progress Notes (Signed)
DISCONTINUE ON PATHWAY REGIMEN - Breast   Doxorubicin + Cyclophosphamide (AC):   A cycle is every 21 days:     Doxorubicin      Cyclophosphamide    Paclitaxel 80 mg/m2 Weekly:   Administer weekly:     Paclitaxel   **Always confirm dose/schedule in your pharmacy ordering system**  REASON: Disease Progression PRIOR TREATMENT: [id number]: AC-T - [Doxorubicin + Cyclophosphamide q21 Days x 4 Cycles, Followed by Paclitaxel 80 mg/m2 Weekly x 12 Weeks] TREATMENT RESPONSE: Unable to Evaluate  START ON PATHWAY REGIMEN - Breast     A cycle is every 21 days:     Fam-trastuzumab deruxtecan-nxki   **Always confirm dose/schedule in your pharmacy ordering system**  Patient Characteristics: Distant Metastases or Locoregional Recurrent Disease - Unresected or Locally Advanced Unresectable Disease Progressing after Neoadjuvant and Local Therapies, HER2 Low/Negative/Unknown, ER Positive, Chemotherapy, HER2 Low, Second Line Therapeutic Status: Distant Metastases HER2 Status: Low ER Status: Positive (+) PR Status: Positive (+) Therapy Approach Indicated: Standard Chemotherapy/Endocrine Therapy Line of Therapy: Second Line Intent of Therapy: Non-Curative / Palliative Intent, Discussed with Patient

## 2021-02-13 NOTE — Assessment & Plan Note (Signed)
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 Neoadjuvant chemotherapy with dose dense Adriamycin and Cytoxan given every 3 weeks without Neulasta support given her pregnancycompleted 05/28/2019 2. Post-Partumweekly Taxol x12starting 07/03/2019 3. Bilateral mastectomy 10/08/2019: Right mastectomy: Multifocal grade 2 IDC 0.8 cm and 0.6 cm with high-grade DCIS, margins negative, 0/4lymph nodes ER 95%, PR 5%, Ki-67 10%, HER-2 negative;left mastectomy: IDC 0.6 cm, LVI, margins negative, 1/4 lymph nodes positive, ER 60%, PR 60%, HER-2 negative, Ki-67 50% RCB class II 4. Adjuvant radiation therapy 5. Followed by adjuvant antiestrogen therapy with complete estrogen blockade (ovarian suppression with AI) CT C/A/P on 5/13 shows sclerotic bone lesions, bone scan negative 6.DeclinedCDK 4 and 6 inhibitor abemaciclibwith antiestrogen therapy 7. Faslodex+letrozole +Ibrance plus Zoladex along with Xgeva ---------------------------------------------------------------------------------------------------------------------------------------------------- Bone scan 04/01/2020: Interval development of multifocal abnormal areas of increased tracer uptake compatible with diffuse bone metastases bony pelvis, right acetabulum, left pubic bone, anterior left lower rib, thoracic and upper lumbar spine including T6, T9, L1, intertrochanteric region of right femur  03/31/2020: CT CAP: Bone metastases. No visceral mets.  MRI liver  02/09/2021: Diffuse hepatic metastases nearly completely replacing the hepatic parenchyma, diffuse osseous metastases Ultrasound-guided biopsy of the liver was performed.  Start systemic treatment with Enhertu ([facility] 2+) starting 02/20/2021 Palliative care referral will need to be made. Patient was extremely tearful and sad on the telephone.  She wanted to know her prognosis.  I discussed with her that it could be dependent on the response to the next treatment.  We will start her at a lower dosage given her LFT abnormalities and then increase the dosage from second cycle onwards.

## 2021-02-15 ENCOUNTER — Other Ambulatory Visit (HOSPITAL_COMMUNITY): Payer: Self-pay

## 2021-02-16 ENCOUNTER — Encounter: Payer: Self-pay | Admitting: *Deleted

## 2021-02-16 NOTE — Progress Notes (Signed)
Per MD request RN successfully faxed [place] [facility] Profiling Requisition [phone].

## 2021-02-17 MED FILL — Dexamethasone Sodium Phosphate Inj 100 MG/10ML: INTRAMUSCULAR | Qty: 1 | Status: AC

## 2021-02-19 ENCOUNTER — Other Ambulatory Visit: Payer: Self-pay

## 2021-02-19 ENCOUNTER — Inpatient Hospital Stay (HOSPITAL_COMMUNITY)
Admission: EM | Admit: 2021-02-19 | Discharge: 2021-02-24 | DRG: 948 | Disposition: A | Discharge status: Hospice | Payer: Medicaid Other | Attending: Internal Medicine | Admitting: Internal Medicine

## 2021-02-19 ENCOUNTER — Emergency Department (HOSPITAL_COMMUNITY): Payer: Medicaid Other

## 2021-02-19 ENCOUNTER — Encounter (HOSPITAL_COMMUNITY): Payer: Self-pay

## 2021-02-19 DIAGNOSIS — C50812 Malignant neoplasm of overlapping sites of left female breast: Secondary | ICD-10-CM

## 2021-02-19 DIAGNOSIS — Z803 Family history of malignant neoplasm of breast: Secondary | ICD-10-CM

## 2021-02-19 DIAGNOSIS — Z800 Family history of malignant neoplasm of digestive organs: Secondary | ICD-10-CM

## 2021-02-19 DIAGNOSIS — Z87891 Personal history of nicotine dependence: Secondary | ICD-10-CM

## 2021-02-19 DIAGNOSIS — C50912 Malignant neoplasm of unspecified site of left female breast: Secondary | ICD-10-CM | POA: Diagnosis present

## 2021-02-19 DIAGNOSIS — E860 Dehydration: Secondary | ICD-10-CM | POA: Diagnosis present

## 2021-02-19 DIAGNOSIS — Z20822 Contact with and (suspected) exposure to covid-19: Secondary | ICD-10-CM | POA: Diagnosis present

## 2021-02-19 DIAGNOSIS — R451 Restlessness and agitation: Secondary | ICD-10-CM | POA: Diagnosis present

## 2021-02-19 DIAGNOSIS — Z823 Family history of stroke: Secondary | ICD-10-CM

## 2021-02-19 DIAGNOSIS — C7951 Secondary malignant neoplasm of bone: Secondary | ICD-10-CM | POA: Diagnosis present

## 2021-02-19 DIAGNOSIS — R748 Abnormal levels of other serum enzymes: Secondary | ICD-10-CM | POA: Diagnosis present

## 2021-02-19 DIAGNOSIS — Z170 Estrogen receptor positive status [ER+]: Secondary | ICD-10-CM

## 2021-02-19 DIAGNOSIS — K7290 Hepatic failure, unspecified without coma: Secondary | ICD-10-CM | POA: Diagnosis present

## 2021-02-19 DIAGNOSIS — R509 Fever, unspecified: Secondary | ICD-10-CM | POA: Diagnosis not present

## 2021-02-19 DIAGNOSIS — D61818 Other pancytopenia: Secondary | ICD-10-CM

## 2021-02-19 DIAGNOSIS — I509 Heart failure, unspecified: Secondary | ICD-10-CM | POA: Diagnosis present

## 2021-02-19 DIAGNOSIS — Z66 Do not resuscitate: Secondary | ICD-10-CM | POA: Diagnosis present

## 2021-02-19 DIAGNOSIS — G893 Neoplasm related pain (acute) (chronic): Principal | ICD-10-CM | POA: Diagnosis present

## 2021-02-19 DIAGNOSIS — Z79899 Other long term (current) drug therapy: Secondary | ICD-10-CM

## 2021-02-19 DIAGNOSIS — R1011 Right upper quadrant pain: Secondary | ICD-10-CM | POA: Diagnosis not present

## 2021-02-19 DIAGNOSIS — F32A Depression, unspecified: Secondary | ICD-10-CM | POA: Diagnosis present

## 2021-02-19 DIAGNOSIS — E7220 Disorder of urea cycle metabolism, unspecified: Secondary | ICD-10-CM | POA: Diagnosis present

## 2021-02-19 DIAGNOSIS — C50919 Malignant neoplasm of unspecified site of unspecified female breast: Secondary | ICD-10-CM | POA: Diagnosis present

## 2021-02-19 DIAGNOSIS — Z95828 Presence of other vascular implants and grafts: Secondary | ICD-10-CM

## 2021-02-19 DIAGNOSIS — Z515 Encounter for palliative care: Secondary | ICD-10-CM

## 2021-02-19 DIAGNOSIS — K920 Hematemesis: Secondary | ICD-10-CM | POA: Diagnosis present

## 2021-02-19 DIAGNOSIS — R410 Disorientation, unspecified: Secondary | ICD-10-CM | POA: Diagnosis not present

## 2021-02-19 DIAGNOSIS — Z8249 Family history of ischemic heart disease and other diseases of the circulatory system: Secondary | ICD-10-CM

## 2021-02-19 DIAGNOSIS — Z808 Family history of malignant neoplasm of other organs or systems: Secondary | ICD-10-CM

## 2021-02-19 DIAGNOSIS — C787 Secondary malignant neoplasm of liver and intrahepatic bile duct: Secondary | ICD-10-CM | POA: Diagnosis present

## 2021-02-19 DIAGNOSIS — Z9013 Acquired absence of bilateral breasts and nipples: Secondary | ICD-10-CM

## 2021-02-19 DIAGNOSIS — R112 Nausea with vomiting, unspecified: Secondary | ICD-10-CM

## 2021-02-19 DIAGNOSIS — E872 Acidosis, unspecified: Secondary | ICD-10-CM | POA: Diagnosis present

## 2021-02-19 LAB — CBC WITH DIFFERENTIAL/PLATELET
HCT: 33.2 % — ABNORMAL LOW (ref 36.0–46.0)
Hemoglobin: 10.9 g/dL — ABNORMAL LOW (ref 12.0–15.0)
MCH: 33.2 pg (ref 26.0–34.0)
MCHC: 32.8 g/dL (ref 30.0–36.0)
MCV: 101.2 fL — ABNORMAL HIGH (ref 80.0–100.0)
Platelets: 36 10*3/uL — ABNORMAL LOW (ref 150–400)
RBC: 3.28 MIL/uL — ABNORMAL LOW (ref 3.87–5.11)
RDW: 19.9 % — ABNORMAL HIGH (ref 11.5–15.5)
WBC: 3.4 10*3/uL — ABNORMAL LOW (ref 4.0–10.5)
nRBC: 63.3 % — ABNORMAL HIGH (ref 0.0–0.2)

## 2021-02-19 LAB — COMPREHENSIVE METABOLIC PANEL
ALT: 144 U/L — ABNORMAL HIGH (ref 0–44)
AST: 592 U/L — ABNORMAL HIGH (ref 15–41)
Albumin: 3.1 g/dL — ABNORMAL LOW (ref 3.5–5.0)
Alkaline Phosphatase: 246 U/L — ABNORMAL HIGH (ref 38–126)
Anion gap: 13 (ref 5–15)
BUN: 13 mg/dL (ref 6–20)
CO2: 25 mmol/L (ref 22–32)
Calcium: 8.1 mg/dL — ABNORMAL LOW (ref 8.9–10.3)
Chloride: 97 mmol/L — ABNORMAL LOW (ref 98–111)
Creatinine, Ser: 0.71 mg/dL (ref 0.44–1.00)
GFR, Estimated: >60 mL/min (ref >60)
Glucose, Bld: 144 mg/dL — ABNORMAL HIGH (ref 70–99)
Potassium: 3.7 mmol/L (ref 3.5–5.1)
Sodium: 135 mmol/L (ref 135–145)
Total Bilirubin: 14.7 mg/dL — ABNORMAL HIGH (ref 0.3–1.2)
Total Protein: 7.5 g/dL (ref 6.5–8.1)

## 2021-02-19 LAB — LACTIC ACID, PLASMA
Lactic Acid, Venous: 4.6 mmol/L (ref 0.5–1.9)
Lactic Acid, Venous: 3.5 mmol/L (ref 0.5–1.9)

## 2021-02-19 LAB — LIPASE, BLOOD: Lipase: 177 U/L — ABNORMAL HIGH (ref 11–51)

## 2021-02-19 LAB — RESP PANEL BY RT-PCR (FLU A&B, COVID) ARPGX2
Influenza A by PCR: NEGATIVE
Influenza B by PCR: NEGATIVE
SARS Coronavirus 2 by RT PCR: NEGATIVE

## 2021-02-19 LAB — I-STAT BETA HCG BLOOD, ED (MC, WL, AP ONLY): I-stat hCG, quantitative: <5 m[IU]/mL (ref <5)

## 2021-02-19 IMAGING — CT CT ABD-PELV W/ CM
2 of 4 series · 15 of 46 positions shown, 17 images · IV contrast (omnipaque)
Comparison: MRI abdomen dated [DATE]. CT abdomen/pelvis dated
[DATE].

CLINICAL DATA: Vomiting, abdominal pain after starting new cancer
medication. Breast cancer with hepatic metastases.

EXAM:
CT ABDOMEN AND PELVIS WITH CONTRAST
TECHNIQUE: Multidetector CT imaging of the abdomen and pelvis was performed
using the standard protocol following bolus administration of
intravenous contrast.
CONTRAST:  80mL OMNIPAQUE IOHEXOL 350 MG/ML SOLN

[Series 2: axial st · axial · 0.78mm/px · z∈[-505,-55]mm · 12 of 102 slices shown, 14 images]
[im 6/102  soft-tissue]
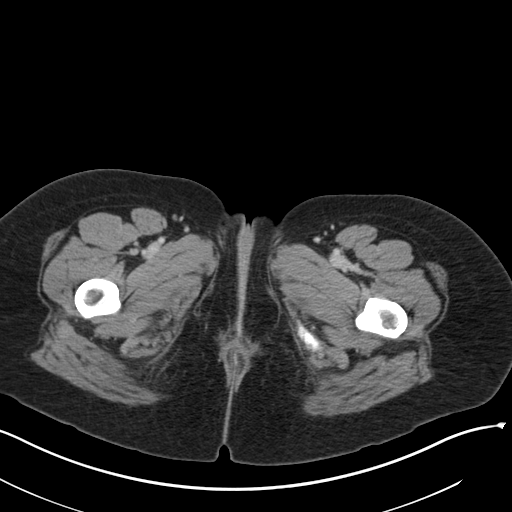
[im 6/102  bone]
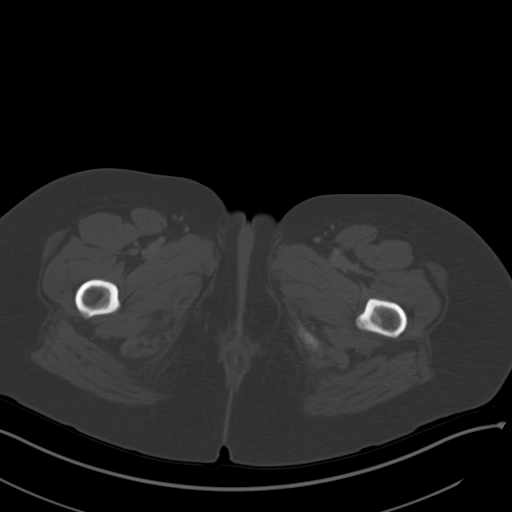
[im 17/102  soft-tissue]
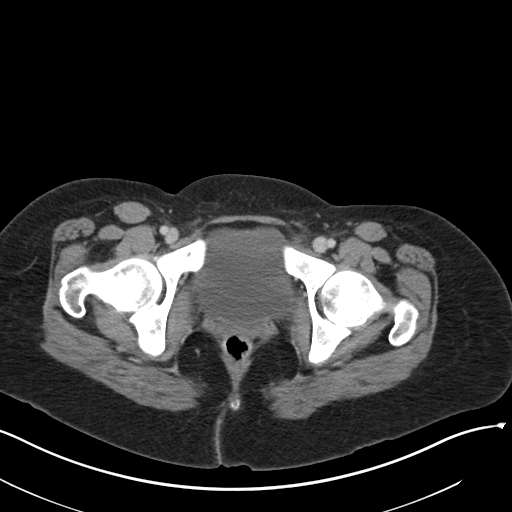
[im 23/102  soft-tissue]
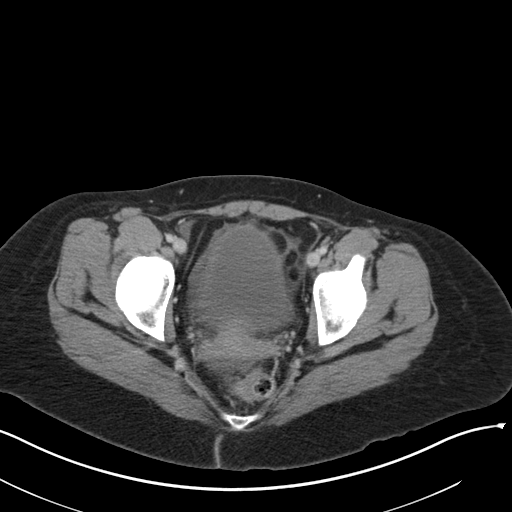
[im 29/102  soft-tissue]
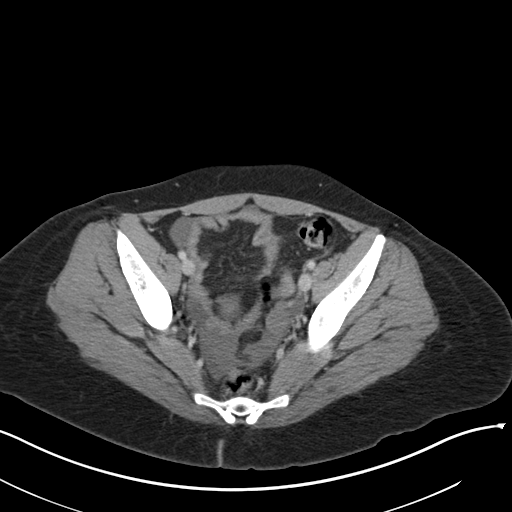
[im 40/102  soft-tissue]
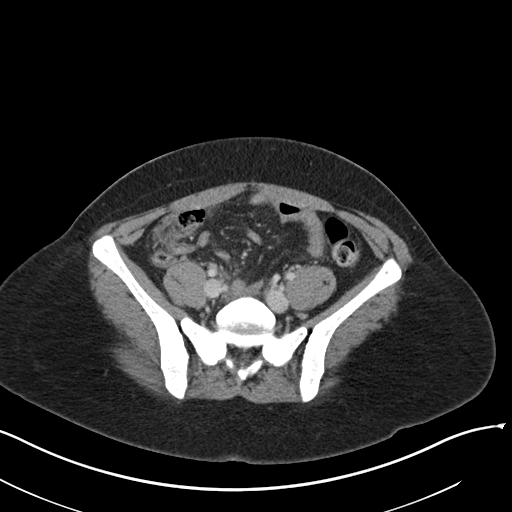
[im 45/102  soft-tissue]
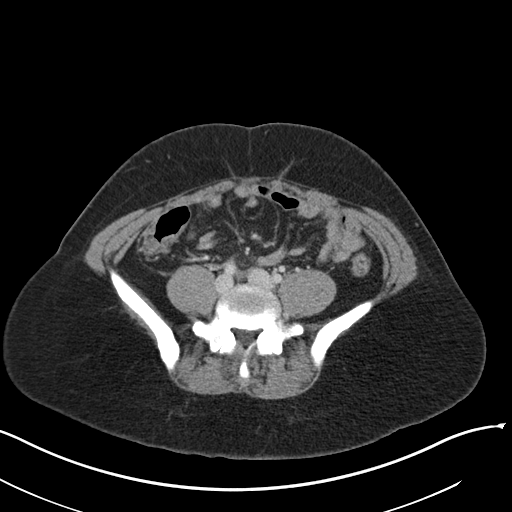
[im 57/102  soft-tissue]
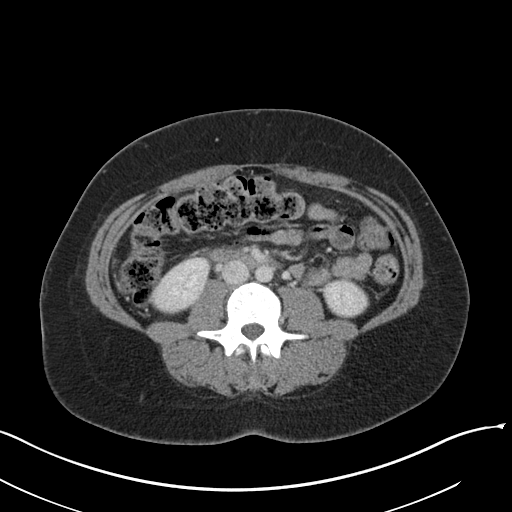
[im 62/102  soft-tissue]
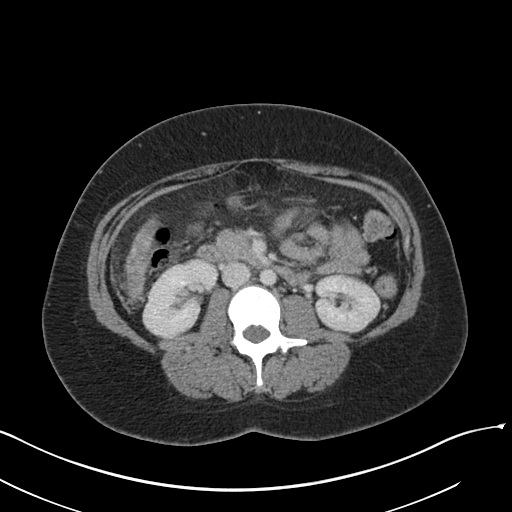
[im 73/102  soft-tissue]
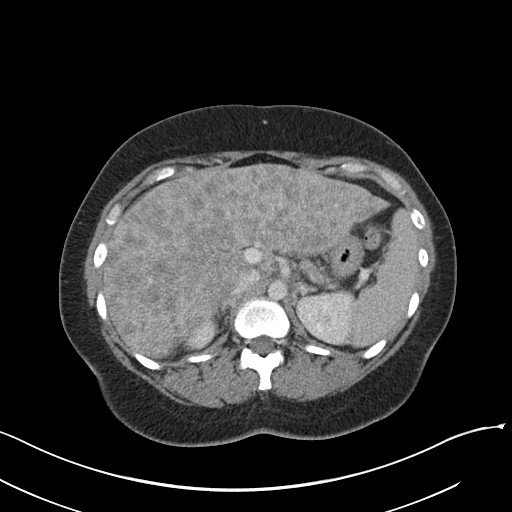
[im 73/102  bone]
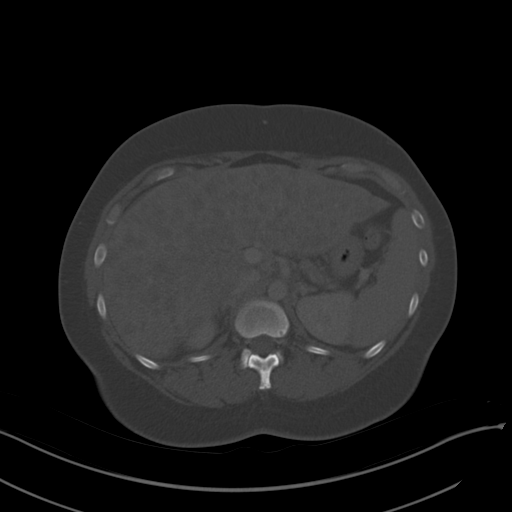
[im 79/102  soft-tissue]
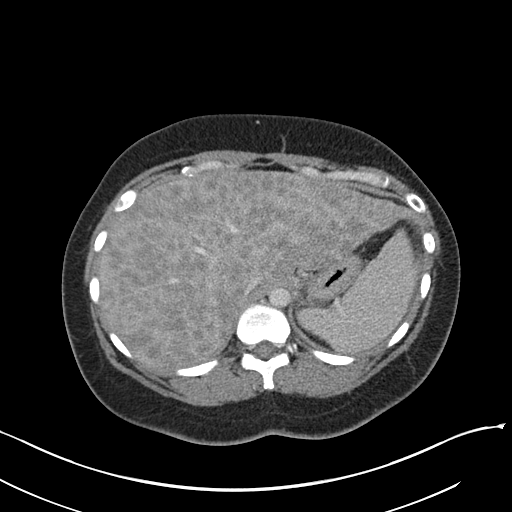
[im 85/102  soft-tissue]
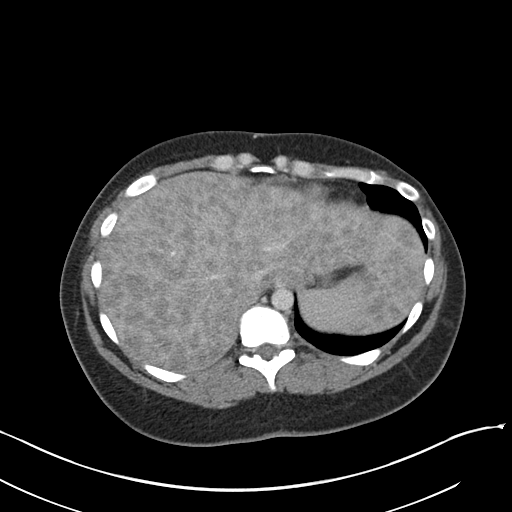
[im 96/102  soft-tissue]
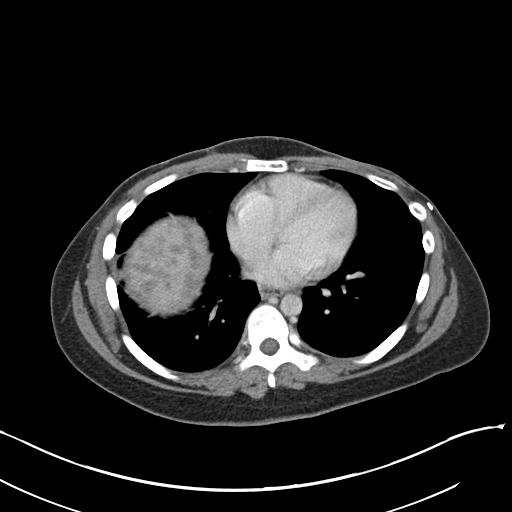

[Series 5: coronal st · coronal · 0.74mm/px · 3 of 157 slices shown]
[im 53/157  soft-tissue]
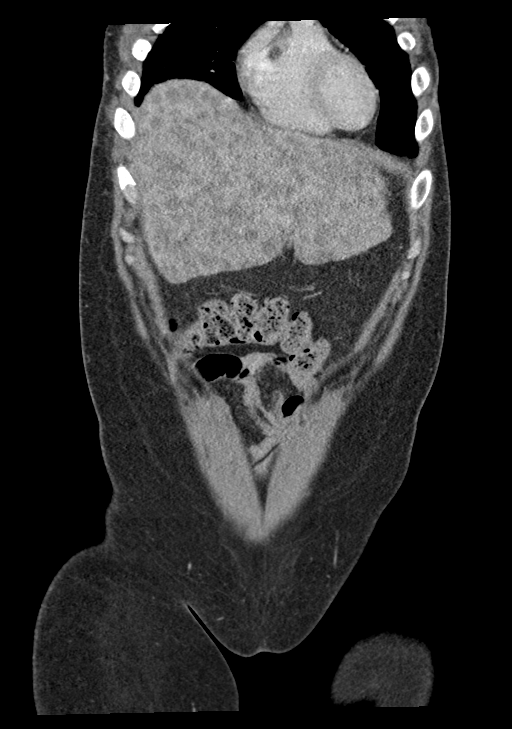
[im 70/157  soft-tissue]
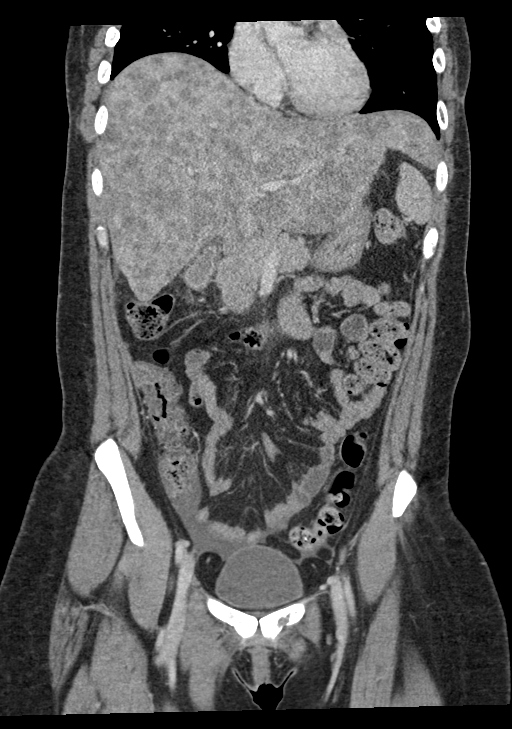
[im 87/157  soft-tissue]
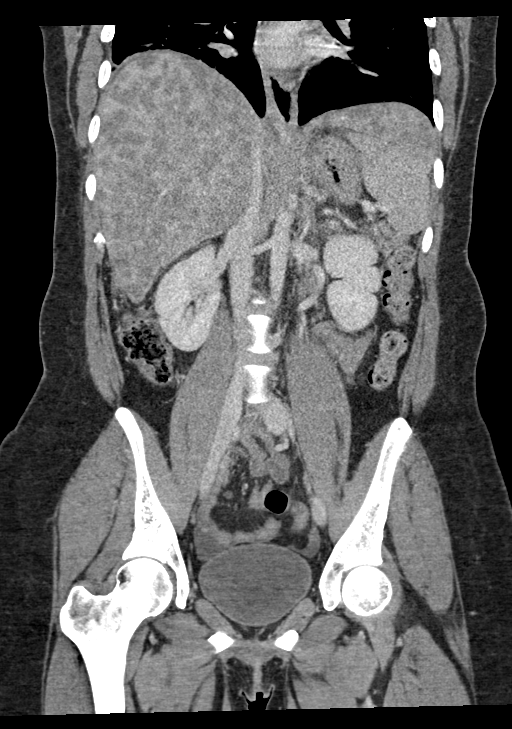

[15 of 46 positions shown; findings below may reference images not displayed]

FINDINGS: Lower chest: Lung bases are clear.

Hepatobiliary: Innumerable/diffuse hepatic metastases, better
evaluated on recent MR.

Gallbladder is underdistended but grossly unremarkable. No
intrahepatic or extrahepatic ductal dilatation.

Pancreas: Within normal limits.

Spleen: Within normal limits.

Adrenals/Urinary Tract: Adrenal glands are within normal limits.

Kidneys are within normal limits.  No hydronephrosis.

Bladder is within normal limits.

Stomach/Bowel: Stomach is within normal limits.

No evidence of bowel obstruction.

Normal appendix (series 2/image 69).

No colonic wall thickening or inflammatory changes.

Vascular/Lymphatic: No evidence of abdominal aortic aneurysm.

No suspicious abdominopelvic lymphadenopathy.

Reproductive: Uterus is within normal limits.

Bilateral ovaries are within normal limits.

Other: Small volume pelvic ascites.

No frank peritoneal nodularity/omental caking.

Musculoskeletal: Multifocal lytic and sclerotic osseous metastases
involving the visualized axial and appendicular skeleton, unchanged
from recent MRI.
IMPRESSION: No evidence of bowel obstruction.  Normal appendix.

Innumerable hepatic metastases, unchanged from recent MRI.

Multifocal lytic and sclerotic osseous metastases, unchanged from
recent MRI.

Small volume pelvic ascites. No frank peritoneal nodularity/omental
caking.

## 2021-02-19 MED ORDER — ONDANSETRON HCL 4 MG/2ML IJ SOLN
4.0000 mg | Freq: Once | INTRAMUSCULAR | Status: AC
  Administered 2021-02-19: 4 mg via INTRAVENOUS
  Filled 2021-02-19: qty 2

## 2021-02-19 MED ORDER — IOHEXOL 350 MG/ML SOLN
80.0000 mL | Freq: Once | INTRAVENOUS | Status: AC | PRN
  Administered 2021-02-19: 80 mL via INTRAVENOUS

## 2021-02-19 MED ORDER — HYDROMORPHONE HCL 1 MG/ML IJ SOLN
0.5000 mg | Freq: Once | INTRAMUSCULAR | Status: AC
  Administered 2021-02-19: 0.5 mg via INTRAVENOUS
  Filled 2021-02-19: qty 1

## 2021-02-19 MED ORDER — SODIUM CHLORIDE 0.9 % IV BOLUS
1000.0000 mL | Freq: Once | INTRAVENOUS | Status: AC
  Administered 2021-02-19: 1000 mL via INTRAVENOUS

## 2021-02-19 MED ORDER — SODIUM CHLORIDE 0.9 % IV SOLN: Freq: Once | INTRAVENOUS | Status: AC

## 2021-02-19 MED ORDER — IOHEXOL 300 MG/ML  SOLN: 80.0000 mL | Freq: Once | INTRAMUSCULAR | Status: DC | PRN

## 2021-02-19 NOTE — Progress Notes (Incomplete)
Patient Care Team: [name], NP as PCP - General (Nurse Practitioner) [name], RN as Oncology Nurse Navigator Rockwell Germany, RN as Oncology Nurse Navigator Rolm Bookbinder, MD as Surgeon (General Surgery) Nicholas Lose, MD as Medical Oncologist (Hematology and Oncology)  DIAGNOSIS: No diagnosis found.  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 ER 50%, PR 30%, Ki-67 50%, HER-2 negative ratio 1.03   03/04/2019 Cancer Staging   Staging form: Breast, AJCC 8th Edition - Clinical stage from 03/04/2019: Stage IIIB (cT4, cN1, cM0, G3, ER+, PR+, HER2-) - Signed by GNicholas Lose MD on 03/04/2019     Neo-Adjuvant Chemotherapy   Adriamycin and Cytoxan x 4 given every three weeks without Neulasta support., followed by weekly Taxol x 12 versus Taxotere Cytoxan after her delivery   04/15/2019 Genetic Testing   Negative genetic testing:  No pathogenic variants detected on the [employer] Breast Cancer Guidelines-Based Panel or the Common Hereditary Cancers Panel. The report date is 04/15/2019.  The Breast Cancer Guidelines-Based panel offered by [facility] includes sequencing and rearrangement analysis for the following 11 genes:  ATM, BRCA1, BRCA2, CDH1, CHEK2, NBN, NF1, PALB2, PTEN, STK11 and TP53.  The Common Hereditary Cancers Panel offered by [facility] includes sequencing and/or deletion duplication testing of the following 48 genes: APC, ATM, AXIN2, BARD1, BMPR1A, BRCA1, BRCA2, BRIP1, CDH1, CDK4, CDKN2A (p14ARF), CDKN2A (p16INK4a),  CHEK2, CTNNA1, DICER1, EPCAM (Deletion/duplication testing only), GREM1 (promoter region deletion/duplication testing only), KIT, MEN1, MLH1, MSH2, MSH3, MSH6, MUTYH, NBN, NF1, NHTL1, PALB2, PDGFRA, PMS2, POLD1, POLE, PTEN, RAD50, RAD51C, RAD51D, RNF43, SDHB, SDHC, SDHD, SMAD4, SMARCA4. STK11, TP53, TSC1, TSC2, and VHL.  The following genes were evaluated for sequence changes only: SDHA and HOXB13 c.251G>A variant only.    10/08/2019 Surgery   Bilateral mastectomies ([name]:  Right mastectomy: Multifocal grade 2 IDC 0.8 cm and 0.6 cm with high-grade DCIS, margins negative, 0/4 lymph nodes ER 95%, PR 5%, Ki-67 10%, HER-2 negative;  Left mastectomy: IDC 0.6 cm, LVI, margins negative, 1/4 lymph nodes positive, ER 60%, PR 60%, HER-2 negative, Ki-67 50% RCB class II   11/30/2019 - 01/08/2020 Radiation Therapy   Adjuvant radiation    Anti-estrogen oral therapy   Ibrance Letrozole, ZMeribeth Mattes  02/20/2021 -  Chemotherapy   Patient is on Treatment Plan : BREAST METASTATIC fam-trastuzumab deruxtecan-nxki (Enhertu) q21d     [place]  02/13/2021 Initial Diagnosis   [place]astatic breast cancer ([facility]   02/20/2021 -  Chemotherapy   Patient is on Treatment Plan : BREAST METASTATIC fam-trastuzumab deruxtecan-nxki (Enhertu) q21d       CHIEF COMPLIANT: Cycle 1 Enhertu  INTERVAL HISTORY: AEricia Moxleyis a 30y.o. with above-mentioned history of severe jaundice and  diffuse hepatic metastases, currently on chemotherapy with Enhertu. She presents to the clinic today for treatment.  ALLERGIES:  has No Known Allergies.  MEDICATIONS:  Current Outpatient Medications  Medication Sig Dispense Refill   anastrozole (ARIMIDEX) 1 MG tablet TAKE 1 TABLET(1 MG) BY MOUTH DAILY (Patient taking differently: Take 1 mg by mouth daily.) 90 tablet 0   lidocaine-prilocaine (EMLA) cream  Apply to affected area once 30 g 3   LORazepam (ATIVAN) 0.5 MG tablet Take 1 tablet (0.5 mg total) by mouth at  bedtime as needed (Nausea or vomiting). 30 tablet 3   ondansetron (ZOFRAN) 8 MG tablet Take 1 tablet (8 mg total) by mouth 2 (two) times daily as needed for refractory nausea / vomiting. Start on day 3 after chemo. 30 tablet 1   ondansetron (ZOFRAN-ODT) 4 MG disintegrating tablet Take 1 tablet (4 mg total) by mouth every 8 (eight) hours as needed for nausea or vomiting. 30 tablet 3   oxyCODONE (OXY IR/ROXICODONE) 5 MG immediate release tablet Take 1 tablet (5 mg total) by mouth every 4 (four) hours as needed for severe pain. 30 tablet 0   palbociclib (IBRANCE) 100 MG tablet TAKE 1 TABLET BY MOUTH DAILY. TAKE FOR 21 DAYS ON, 7 DAYS OFF, REPEAT EVERY 28 DAYS. (Patient taking differently: 100 mg as directed.) 21 tablet 6   polyethylene glycol (MIRALAX) 17 g packet Take 17 g by mouth daily. 14 each 0   prochlorperazine (COMPAZINE) 10 MG tablet Take 1 tablet (10 mg total) by mouth every 6 (six) hours as needed (Nausea or vomiting). 30 tablet 1   No current facility-administered medications for this visit.    PHYSICAL EXAMINATION: ECOG PERFORMANCE STATUS: {CHL ONC ECOG PS:[phone]}  There were no vitals filed for this visit. There were no vitals filed for this visit.  LABORATORY DATA:  I have reviewed the data as listed CMP Latest Ref Rng & Units 02/11/2021 02/10/2021 02/09/2021  Glucose 70 - 99 mg/dL 81 102(H) 85  BUN 6 - 20 mg/dL _0 Creatinine 0.44 - 1.00 mg/dL 0.99 1.14(H) 1.19(H)  Sodium 135 - 145 mmol/L 134(L) 135 137  Potassium 3.5 - 5.1 mmol/L 4.2 3.8 3.8  Chloride 98 - 111 mmol/L 105 102 100  CO2 22 - 32 mmol/L _1 Calcium 8.9 - 10.3 mg/dL 7.5(L) 7.9(L) 8.8(L)  Total Protein 6.5 - 8.1 g/dL 6.0(L) 6.5 7.8  Total Bilirubin 0.3 - 1.2 mg/dL 4.4(H) 4.0(H) 5.0(HH)  Alkaline Phos 38 - 126 U/L 232(H) 253(H) 363(H)  AST 15 - 41 U/L 311(H) 269(H) 336(HH)  ALT 0 - 44 U/L 110(H) 112(H) 151(H)    Lab Results  Component Value Date   WBC 2.0 (L) 02/11/2021   HGB 10.1 (L)  02/11/2021   HCT 31.0 (L) 02/11/2021   MCV 103.3 (H) 02/11/2021   PLT 65 (L) 02/11/2021   NEUTROABS 0.8 (L) 02/11/2021    ASSESSMENT & PLAN:  No problem-specific Assessment & Plan notes found for this encounter.    No orders of the defined types were placed in this encounter.  The patient has a good understanding of the overall plan. she agrees with it. she will call with any problems that may develop before the next visit here.  Total time spent: *** mins including face to face time and time spent for planning, charting and coordination of care  Rulon Eisenmenger, MD, MPH 02/19/2021  I, Thana Ates, am acting as scribe for Dr. Nicholas Lose.  {insert scribe attestation}

## 2021-02-19 NOTE — ED Provider Notes (Signed)
[facility] DEPT Provider Note   CSN: [id number] Arrival date & time: 02/19/21  1941     History Chief Complaint  Patient presents with   Emesis   Abdominal Pain    Melissa [name] is a 30 y.o. female.  30 year old female with prior medical history as detailed below presents with complaint of persistent right upper quadrant abdominal pain.  Patient with medical history significant for metastatic breast cancer.  Patient with recently diagnosed liver involvement of her metastatic disease.  Patient presents today complaining of persistent right-sided discomfort and associated nausea and vomiting.  Patient reports that she has not been able to keep down her pain medications.  She denies fever.  The history is provided by the patient.  Emesis Severity:  Moderate Duration:  3 days Timing:  Constant Progression:  Worsening Chronicity:  Recurrent Recent urination:  Normal Associated symptoms: abdominal pain   Abdominal Pain Associated symptoms: vomiting       Past Medical History:  Diagnosis Date   Abnormal Pap smear 07/13/11   ASCUS +HPV   Anemia    CHF (congestive heart failure) ([place])    Family history of breast cancer    Family history of colon cancer    Family history of throat cancer    Fracture of distal phalanx of finger 10/05/2019   Right thumb and middle finger   Herpes simplex without mention of complication    dx age 40   left breast ca dx'd 02/26/19   breast cancer    Patient Active Problem List   Diagnosis Date Noted   Metastatic breast cancer ([place]) 02/13/2021   Abdominal pain 02/09/2021   Hepatic metastasis ([place]) 02/09/2021   Elevated liver enzymes 02/09/2021   Breast cancer, left breast ([place]) 10/08/2019   Genetic testing 04/15/2019   Port-A-Cath in place 03/25/2019   Family history of breast cancer    Family history of colon cancer    Family history of throat cancer    Malignant neoplasm of overlapping sites of left  breast in female, estrogen receptor positive ([place]) 03/04/2019   PCOS (polycystic ovarian syndrome) 08/22/2012   Anovulation 07/09/2012   ASCUS (atypical squamous cells of undetermined significance) on Pap smear 12/04/2010    Past Surgical History:  Procedure Laterality Date   BREAST BIOPSY Left 03/18/2019   Procedure: LEFT BREAST SKIN PUNCH BIOPSY;  Surgeon: Rolm Bookbinder, MD;  Location: [facility];  Service: General;  Laterality: Left;   COLPOSCOPY     CIN I ECC -   MASTECTOMY Bilateral 10/08/2019   BILATERAL MASTECTOMY WITH LEFT AXILLARY SENTINEL LYMPH NODE BIOPSY (Bilateral Breast)    MASTECTOMY W/ SENTINEL NODE BIOPSY Bilateral 10/08/2019   Procedure: BILATERAL MASTECTOMY WITH LEFT AXILLARY SENTINEL LYMPH NODE BIOPSY;  Surgeon: Rolm Bookbinder, MD;  Location: [facility];  Service: General;  Laterality: Bilateral;  BILATERAL PEC BLOCK   NO PAST SURGERIES     PORTACATH PLACEMENT N/A 03/18/2019   Procedure: INSERTION PORT-A-CATH WITH ULTRASOUND GUIDANCE;  Surgeon: Rolm Bookbinder, MD;  Location: [facility];  Service: General;  Laterality: N/A;   RADIOACTIVE SEED GUIDED AXILLARY SENTINEL LYMPH NODE Left 10/08/2019   Procedure: LEFT RADIOACTIVE SEED GUIDED AXILLARY SENTINEL LYMPH NODE EXCISION;  Surgeon: Rolm Bookbinder, MD;  Location: [facility];  Service: General;  Laterality: Left;  PEC BLOCK     OB History     Gravida  2   Para  1   Term  0   Preterm  1   AB  0  Living  1      SAB  0   IAB  0   Ectopic  0   Multiple  0   Live Births  1           Family History  Problem Relation Age of Onset   Breast cancer Paternal Grandmother        dx. 24s or younger   Stroke Father 30   Hypertension Father    Cancer Maternal Aunt        unknown type, dx. late 68s   Colon cancer Paternal Uncle        dx 21s   Throat cancer Maternal Grandmother        dx. 65s   Cancer Paternal Uncle        unknown type, dx. 33s   Breast cancer Cousin        dx. 71s, paternal 1st cousin     Social History   Tobacco Use   Smoking status: Former    Types: Cigarettes    Quit date: 02/27/2019    Years since quitting: 1.9   Smokeless tobacco: Never  Vaping Use   Vaping Use: Never used  Substance Use Topics   Alcohol use: Not Currently   Drug use: Yes    Frequency: 2.0 times per week    Types: Marijuana    Comment: x1 weekly    Home Medications Prior to Admission medications   Medication Sig Start Date End Date Taking? Authorizing Provider  anastrozole (ARIMIDEX) 1 MG tablet TAKE 1 TABLET(1 MG) BY MOUTH DAILY Patient taking differently: Take 1 mg by mouth daily. 01/16/21   Nicholas Lose, MD  lidocaine-prilocaine (EMLA) cream Apply to affected area once 02/13/21   Nicholas Lose, MD  LORazepam (ATIVAN) 0.5 MG tablet Take 1 tablet (0.5 mg total) by mouth at bedtime as needed (Nausea or vomiting). 02/13/21   Nicholas Lose, MD  ondansetron (ZOFRAN) 8 MG tablet Take 1 tablet (8 mg total) by mouth 2 (two) times daily as needed for refractory nausea / vomiting. Start on day 3 after chemo. 02/13/21   Nicholas Lose, MD  ondansetron (ZOFRAN-ODT) 4 MG disintegrating tablet Take 1 tablet (4 mg total) by mouth every 8 (eight) hours as needed for nausea or vomiting. 02/09/21   Nicholas Lose, MD  oxyCODONE (OXY IR/ROXICODONE) 5 MG immediate release tablet Take 1 tablet (5 mg total) by mouth every 4 (four) hours as needed for severe pain. 02/09/21   Nicholas Lose, MD  palbociclib (IBRANCE) 100 MG tablet TAKE 1 TABLET BY MOUTH DAILY. TAKE FOR 21 DAYS ON, 7 DAYS OFF, REPEAT EVERY 28 DAYS. Patient taking differently: 100 mg as directed. 11/22/20 11/22/21  Nicholas Lose, MD  polyethylene glycol (MIRALAX) 17 g packet Take 17 g by mouth daily. 02/11/21   Shelly Coss, MD  prochlorperazine (COMPAZINE) 10 MG tablet Take 1 tablet (10 mg total) by mouth every 6 (six) hours as needed (Nausea or vomiting). 02/13/21   Nicholas Lose, MD    Allergies    Patient has no known allergies.  Review of Systems    Review of Systems  Gastrointestinal:  Positive for abdominal pain and vomiting.  All other systems reviewed and are negative.  Physical Exam Updated Vital Signs BP 131/83 (BP Location: Right Arm)   Pulse (!) 110   Temp 99.5 F (37.5 C) (Oral)   Resp 16   SpO2 95%   Physical Exam Vitals and nursing note reviewed.  Constitutional:  General: She is not in acute distress.    Appearance: Normal appearance. She is well-developed.  HENT:     Head: Normocephalic and atraumatic.  Eyes:     Conjunctiva/sclera: Conjunctivae normal.     Pupils: Pupils are equal, round, and reactive to light.  Cardiovascular:     Rate and Rhythm: Normal rate and regular rhythm.     Heart sounds: Normal heart sounds.  Pulmonary:     Effort: Pulmonary effort is normal. No respiratory distress.     Breath sounds: Normal breath sounds.  Abdominal:     General: There is no distension.     Palpations: Abdomen is soft.     Tenderness: There is abdominal tenderness in the right upper quadrant.  Musculoskeletal:        General: No deformity. Normal range of motion.     Cervical back: Normal range of motion and neck supple.  Skin:    General: Skin is warm and dry.  Neurological:     General: No focal deficit present.     Mental Status: She is alert and oriented to person, place, and time.    ED Results / Procedures / Treatments   Labs (all labs ordered are listed, but only abnormal results are displayed) Labs Reviewed  RESP PANEL BY RT-PCR (FLU A&B, COVID) ARPGX2  CBC WITH DIFFERENTIAL/PLATELET  COMPREHENSIVE METABOLIC PANEL  URINALYSIS, ROUTINE W REFLEX MICROSCOPIC  LIPASE, BLOOD  LACTIC ACID, PLASMA  LACTIC ACID, PLASMA  I-STAT BETA HCG BLOOD, ED ([facility], [facility], [facility] ONLY)    EKG None  Radiology No results found.  Procedures Procedures   Medications Ordered in ED Medications  sodium chloride 0.9 % bolus 1,000 mL (1,000 mLs Intravenous New Bag/Given 02/19/21 2025)  ondansetron (ZOFRAN)  injection 4 mg (4 mg Intravenous Given 02/19/21 2025)  HYDROmorphone (DILAUDID) injection 0.5 mg (0.5 mg Intravenous Given 02/19/21 2034)    ED Course  I have reviewed the triage vital signs and the nursing notes.  Pertinent labs & imaging results that were available during my care of the patient were reviewed by me and considered in my medical decision making (see chart for details).    MDM Rules/Calculators/A&P                           MDM  MSE complete  Melissa [name] was evaluated in Emergency Department on 02/19/2021 for the symptoms described in the history of present illness. She was evaluated in the context of the global COVID-19 pandemic, which necessitated consideration that the patient might be at risk for infection with the SARS-CoV-2 virus that causes COVID-19. Institutional protocols and algorithms that pertain to the evaluation of patients at risk for COVID-19 are in a state of rapid change based on information released by regulatory bodies including the CDC and federal and state organizations. These policies and algorithms were followed during the patient's care in the ED.  Patient with history of metastatic breast cancer.  Patient with recent admission which demonstrated progression of disease with involvement of her liver.  Patient presents tonight with complaint of persistent and uncontrolled right upper quadrant pain.  This is associated with nausea and vomiting.  Patient does feel improved after initial IV doses of antiemetics and narcotics.  Repeat labs demonstrate continued elevation in liver function test and total bili (AST 592, ALT 144, Tbili 14.7).  Patient would benefit from admission.    Case discussed briefly with Dr. Posey Pronto of the hospitalist  service.  Dr. Posey Pronto is requesting imaging of the abdomen.  He is appropriately concerned about possible obstructive pathology biliary system. He requests call back after CT imaging complete.  Of note, patient with  recent MRI and MRCP imaging on 12/1.  MRCP on 12/1 was without evidence of obstruction.  Patient will likely require repeat MRCP if there is a high concern for biliary obstruction.  Dr. Christy Gentles aware of pending CT and disposition.      Final Clinical Impression(s) / ED Diagnoses Final diagnoses:  Right upper quadrant abdominal pain  Nausea and vomiting, unspecified vomiting type    Rx / DC Orders ED Discharge Orders     None        Valarie Merino, MD 02/19/21 2311

## 2021-02-19 NOTE — Assessment & Plan Note (Deleted)
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 Neoadjuvant chemotherapy with dose dense Adriamycin and Cytoxan given every 3 weeks without Neulasta support given her pregnancycompleted 05/28/2019 2. Post-Partumweekly Taxol x12starting 07/03/2019 3. Bilateral mastectomy 10/08/2019: Right mastectomy: Multifocal grade 2 IDC 0.8 cm and 0.6 cm with high-grade DCIS, margins negative, 0/4lymph nodes ER 95%, PR 5%, Ki-67 10%, HER-2 negative;left mastectomy: IDC 0.6 cm, LVI, margins negative, 1/4 lymph nodes positive, ER 60%, PR 60%, HER-2 negative, Ki-67 50% RCB class II 4. Adjuvant radiation therapy 5. Followed by adjuvant antiestrogen therapy with complete estrogen blockade (ovarian suppression with AI) CT C/A/P on 5/13 shows sclerotic bone lesions, bone scan negative 6.DeclinedCDK 4 and 6 inhibitor abemaciclibwith antiestrogen therapy 7. Faslodex+letrozole +Ibrance plus Zoladex along with Xgeva ---------------------------------------------------------------------------------------------------------------------------------------------------- Bone scan 04/01/2020: Interval development of multifocal abnormal areas of increased tracer uptake compatible with diffuse bone metastases bony pelvis, right acetabulum, left pubic bone, anterior left lower rib, thoracic and upper lumbar spine including T6, T9, L1, intertrochanteric region of right femur  03/31/2020: CT CAP: Bone metastases. No visceral mets.  MRI liver  02/09/2021: Diffuse hepatic metastases nearly completely replacing the hepatic parenchyma, diffuse osseous metastases Ultrasound-guided biopsy of the liver was performed.  Start systemic treatment with Enhertu (IHC 2+) starting 02/20/2021 Palliative care referral will need to be made.  We will start her at a lower dosage given her LFT abnormalities and then increase the dosage from second cycle onwards.

## 2021-02-19 NOTE — ED Triage Notes (Signed)
Pt reports with vomiting and abdominal pain x 2 weeks after starting a new cancer medication.

## 2021-02-19 NOTE — H&P (Signed)
History and Physical    Melissa [name] DKR:[id number] DOB: [birth year] DOA: 02/19/2021  PCP: William Dalton, NP  Patient coming from: Home  I have personally briefly reviewed patient's old medical records in [facility]  Chief Complaint: Abdominal pain, nausea, vomiting  HPI: Melissa [name] is a 30 y.o. 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  Recently admitted 02/09/2021-02/11/2021 for abdominal pain and severe jaundice.  Liver MRI showed new diffuse hepatic metastases nearly completely replacing the hepatic parenchyma.  There was no evidence of obstruction.  Liver biopsy performed 12/2 and shows metastatic carcinoma.  Patient is planned to start systemic therapy with Enhertu on 02/20/21 per her oncologist, Dr. Pamelia Hoit.  Patient states she was feeling well for few days after hospital admission.  Over the last 3 days she has been having significant right upper quadrant and right flank abdominal pain.  She has been having nausea with nonbloody emesis.  She has not been able to maintain any adequate oral intake or even keep her medications down.  She is feeling dehydrated.  She has not had any diarrhea.  Last bowel movement was 2 days ago.  She denies any obvious bleeding.  ED Course:  Initial vitals showed BP 131/83, pulse 118, RR 16, temp 99.5 F, SPO2 100% on room air.  Labs significant for total bilirubin 14.7, AST 592, ALT 144, alk phos 246 (previously 4.4, 311, 110, 232 respectively on 02/11/2021), WBC 3.4, hemoglobin 10.9, platelets 36,000 (previously 65,000 on 12/3), lipase 177, lactic acid 4.6 > 3.5.  I-STAT beta-hCG <5.0.  SARS-CoV-2 and influenza PCR negative.  CT abdomen/pelvis with contrast is negative for evidence of bowel obstruction, cholecystitis, intrahepatic or extrahepatic ductal dilatation.  Innumerable hepatic  metastases again noted.  Multifocal lytic and sclerotic osseous metastases also unchanged from recent MRI.  Small volume pelvic ascites seen without frank peritoneal nodularity/omental caking.  Patient was given 1 L normal saline, IV Dilaudid 0.5 mg x 2, Zofran.  The hospitalist service was consulted to admit for further evaluation and management.  Review of Systems: All systems reviewed and are negative except as documented in history of present illness above.   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 age 57   left breast ca dx'd 02/26/19   breast cancer    Past Surgical History:  Procedure Laterality Date   BREAST BIOPSY Left 03/18/2019   Procedure: LEFT BREAST SKIN PUNCH BIOPSY;  Surgeon: Emelia Loron, MD;  Location: [facility] OR;  Service: General;  Laterality: Left;   COLPOSCOPY     CIN I ECC -   MASTECTOMY Bilateral 10/08/2019   BILATERAL MASTECTOMY WITH LEFT AXILLARY SENTINEL LYMPH NODE BIOPSY (Bilateral Breast)    MASTECTOMY W/ SENTINEL NODE BIOPSY Bilateral 10/08/2019   Procedure: BILATERAL MASTECTOMY WITH LEFT AXILLARY SENTINEL LYMPH NODE BIOPSY;  Surgeon: Emelia Loron, MD;  Location: [facility] OR;  Service: General;  Laterality: Bilateral;  BILATERAL PEC BLOCK   NO PAST SURGERIES  PORTACATH PLACEMENT N/A 03/18/2019   Procedure: INSERTION PORT-A-CATH WITH ULTRASOUND GUIDANCE;  Surgeon: Rolm Bookbinder, MD;  Location: [facility];  Service: General;  Laterality: N/A;   RADIOACTIVE SEED GUIDED AXILLARY SENTINEL LYMPH NODE Left 10/08/2019   Procedure: LEFT RADIOACTIVE SEED GUIDED AXILLARY SENTINEL LYMPH NODE EXCISION;  Surgeon: Rolm Bookbinder, MD;   Location: [facility];  Service: General;  Laterality: Left;  PEC BLOCK    Social History:  reports that she quit smoking about 1 years ago. Her smoking use included cigarettes. She has never used smokeless tobacco. She reports that she does not currently use alcohol. She reports current drug use. Frequency: 2.00 times per week. Drug: Marijuana.  No Known Allergies  Family History  Problem Relation Age of Onset   Breast cancer Paternal Grandmother        dx. 62s or younger   Stroke Father 74   Hypertension Father    Cancer Maternal Aunt        unknown type, dx. late 46s   Colon cancer Paternal Uncle        dx 16s   Throat cancer Maternal Grandmother        dx. 76s   Cancer Paternal Uncle        unknown type, dx. 45s   Breast cancer Cousin        dx. 73s, paternal 1st cousin     Prior to Admission medications   Medication Sig Start Date End Date Taking? Authorizing Provider  anastrozole (ARIMIDEX) 1 MG tablet TAKE 1 TABLET(1 MG) BY MOUTH DAILY Patient taking differently: Take 1 mg by mouth daily. 01/16/21   Nicholas Lose, MD  lidocaine-prilocaine (EMLA) cream Apply to affected area once 02/13/21   Nicholas Lose, MD  LORazepam (ATIVAN) 0.5 MG tablet Take 1 tablet (0.5 mg total) by mouth at bedtime as needed (Nausea or vomiting). 02/13/21   Nicholas Lose, MD  ondansetron (ZOFRAN) 8 MG tablet Take 1 tablet (8 mg total) by mouth 2 (two) times daily as needed for refractory nausea / vomiting. Start on day 3 after chemo. 02/13/21   Nicholas Lose, MD  ondansetron (ZOFRAN-ODT) 4 MG disintegrating tablet Take 1 tablet (4 mg total) by mouth every 8 (eight) hours as needed for nausea or vomiting. 02/09/21   Nicholas Lose, MD  oxyCODONE (OXY IR/ROXICODONE) 5 MG immediate release tablet Take 1 tablet (5 mg total) by mouth every 4 (four) hours as needed for severe pain. 02/09/21   Nicholas Lose, MD  palbociclib (IBRANCE) 100 MG tablet TAKE 1 TABLET BY MOUTH DAILY. TAKE FOR 21 DAYS ON, 7 DAYS OFF, REPEAT  EVERY 28 DAYS. Patient taking differently: 100 mg as directed. 11/22/20 11/22/21  Nicholas Lose, MD  polyethylene glycol (MIRALAX) 17 g packet Take 17 g by mouth daily. 02/11/21   Shelly Coss, MD  prochlorperazine (COMPAZINE) 10 MG tablet Take 1 tablet (10 mg total) by mouth every 6 (six) hours as needed (Nausea or vomiting). 02/13/21   Nicholas Lose, MD    Physical Exam: Vitals:   02/19/21 2200 02/19/21 2230 02/19/21 2233 02/19/21 2248  BP: (!) 143/86 (!) 152/92  (!) 152/92  Pulse: (!) 108 (!) 107  (!) 116  Resp:    16  Temp:   99.1 F (37.3 C)   TempSrc:   Oral   SpO2: 98% 96%  97%   Constitutional: Young woman resting in bed with head elevated, NAD, calm, comfortable Eyes: PERRL, significant scleral icterus ENMT: Mucous membranes are dry. Posterior pharynx clear of  any exudate or lesions.Normal dentition.  Neck: normal, supple, no masses. Respiratory: clear to auscultation bilaterally, no wheezing, no crackles. Normal respiratory effort. No accessory muscle use.  Cardiovascular: Tachycardic, no murmurs / rubs / gallops. No extremity edema. 2+ pedal pulses. Abdomen: Mild right-sided tenderness.  Bowel sounds positive.  Musculoskeletal: no clubbing / cyanosis. No joint deformity upper and lower extremities. Good ROM, no contractures. Normal muscle tone.  Skin: no rashes, lesions, ulcers. No induration Neurologic: CN 2-12 grossly intact. Sensation intact. Strength 5/5 in all 4.  Psychiatric: Normal judgment and insight. Alert and oriented x 3. Normal mood.   Labs on Admission: I have personally reviewed following labs and imaging studies  CBC: Recent Labs  Lab 02/19/21 2011  WBC 3.4*  NEUTROABS PENDING  HGB 10.9*  HCT 33.2*  MCV 101.2*  PLT 36*   Basic Metabolic Panel: Recent Labs  Lab 02/19/21 2011  NA 135  K 3.7  CL 97*  CO2 25  GLUCOSE 144*  BUN 13  CREATININE 0.71  CALCIUM 8.1*   GFR: Estimated Creatinine Clearance: 98.7 mL/min (by C-G formula based on SCr  of 0.71 mg/dL). Liver Function Tests: Recent Labs  Lab 02/19/21 2011  AST 592*  ALT 144*  ALKPHOS 246*  BILITOT 14.7*  PROT 7.5  ALBUMIN 3.1*   Recent Labs  Lab 02/19/21 2011  LIPASE 177*   No results for input(s): AMMONIA in the last 168 hours. Coagulation Profile: No results for input(s): INR, PROTIME in the last 168 hours. Cardiac Enzymes: No results for input(s): CKTOTAL, CKMB, CKMBINDEX, TROPONINI in the last 168 hours. BNP (last 3 results) No results for input(s): PROBNP in the last 8760 hours. HbA1C: No results for input(s): HGBA1C in the last 72 hours. CBG: No results for input(s): GLUCAP in the last 168 hours. Lipid Profile: No results for input(s): CHOL, HDL, LDLCALC, TRIG, CHOLHDL, LDLDIRECT in the last 72 hours. Thyroid Function Tests: No results for input(s): TSH, T4TOTAL, FREET4, T3FREE, THYROIDAB in the last 72 hours. Anemia Panel: No results for input(s): VITAMINB12, FOLATE, FERRITIN, TIBC, IRON, RETICCTPCT in the last 72 hours. Urine analysis:    Component Value Date/Time   COLORURINE YELLOW 06/19/2010 1113   APPEARANCEUR CLEAR 06/19/2010 1113   LABSPEC 1.025 06/19/2010 1113   PHURINE 5.5 06/19/2010 1113   GLUCOSEU NEGATIVE 06/19/2010 1113   HGBUR NEGATIVE 06/19/2010 1113   BILIRUBINUR NEGATIVE 06/19/2010 1113   KETONESUR NEGATIVE 06/19/2010 1113   PROTEINUR NEGATIVE 06/19/2010 1113   UROBILINOGEN 0.2 06/19/2010 1113   NITRITE NEGATIVE 06/19/2010 1113   LEUKOCYTESUR SMALL (A) 06/19/2010 1113    Radiological Exams on Admission: CT Abdomen Pelvis W Contrast  Result Date: 02/19/2021 CLINICAL DATA:  Vomiting, abdominal pain after starting new cancer medication. Breast cancer with hepatic metastases. EXAM: CT ABDOMEN AND PELVIS WITH CONTRAST TECHNIQUE: Multidetector CT imaging of the abdomen and pelvis was performed using the standard protocol following bolus administration of intravenous contrast. CONTRAST:  70mL OMNIPAQUE IOHEXOL 350 MG/ML SOLN  COMPARISON:  MRI abdomen dated 02/09/2021. CT abdomen/pelvis dated 07/15/2020. FINDINGS: Lower chest: Lung bases are clear. Hepatobiliary: Innumerable/diffuse hepatic metastases, better evaluated on recent MR. Gallbladder is underdistended but grossly unremarkable. No intrahepatic or extrahepatic ductal dilatation. Pancreas: Within normal limits. Spleen: Within normal limits. Adrenals/Urinary Tract: Adrenal glands are within normal limits. Kidneys are within normal limits.  No hydronephrosis. Bladder is within normal limits. Stomach/Bowel: Stomach is within normal limits. No evidence of bowel obstruction. Normal appendix (series 2/image 69). No colonic wall thickening or inflammatory  changes. Vascular/Lymphatic: No evidence of abdominal aortic aneurysm. No suspicious abdominopelvic lymphadenopathy. Reproductive: Uterus is within normal limits. Bilateral ovaries are within normal limits. Other: Small volume pelvic ascites. No frank peritoneal nodularity/omental caking. Musculoskeletal: Multifocal lytic and sclerotic osseous metastases involving the visualized axial and appendicular skeleton, unchanged from recent MRI. IMPRESSION: No evidence of bowel obstruction.  Normal appendix. Innumerable hepatic metastases, unchanged from recent MRI. Multifocal lytic and sclerotic osseous metastases, unchanged from recent MRI. Small volume pelvic ascites. No frank peritoneal nodularity/omental caking. Electronically Signed   By: Julian Hy M.D.   On: 02/19/2021 23:23    EKG: Not performed.  Assessment/Plan Principal Problem:   RUQ abdominal pain Active Problems:   Malignant neoplasm of overlapping sites of left breast in female, estrogen receptor positive ([place])   Hepatic metastasis (HCC)   Elevated liver enzymes   Pancytopenia (HCC)   Melissa [name] is a 30 y.o. 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  Cancer associated right upper quadrant/right flank pain with nausea and vomiting: Secondary to her breast cancer with osseous and diffuse hepatic metastases.  Continue supportive care. -Continue Oxy IR and IV Dilaudid as needed for pain -Continue IV Zofran and Ativan qhs prn for nausea/vomiting -Start on IV fluid hydration with NS$RemoveBeforeDE'@100'OQYYvGYlFEiwMte$  mL/hour overnight until oral intake improves -Clear liquid diet and advance as tolerated -Would benefit from palliative care consultation  Severe hyperbilirubinemia with elevated LFTs and lipase: Secondary to diffuse hepatic metastases.  CT A/P without evidence of obstruction.  Total bilirubin up to 14.7.  Pancytopenia: Secondary to cancer/diffuse hepatic metastases.  Platelets trended downwards, 36,000 on admission.  She denies any obvious bleeding.  Currently no indication for PRBC or platelet transfusion.  Malignant ER+ left-sided breast cancer with osseous and diffuse hepatic metastases: Follows with oncology, Dr. Lindi Adie.  S/p chemotherapy, bilateral mastectomy, adjuvant radiation on active antiestrogen therapy.  Recently found to have diffuse hepatic metastases.  Has plans to start systemic treatment with Enhertu on 02/20/2021. -Notify oncology of admission in a.m.  DVT prophylaxis: SCDs only due to severe thrombocytopenia Code Status: Full code, confirmed with patient on admission Family Communication: Discussed with patient's brother at bedside Disposition Plan: From home, dispo pending clinical progress Consults called: None Level of care: Med-Surg Admission status:  Status is: Observation  The patient remains OBS appropriate and will d/c before 2 midnights.  Zada Finders MD [facility]  If 7PM-7AM, please contact night-coverage [email or web address]  02/19/2021, 11:39 PM

## 2021-02-19 NOTE — ED Notes (Signed)
Date and time results received: 02/19/21 2142  Test: Lactic  Critical Value: 4.6  Name of Provider Notified: [place]  Orders Received? Or Actions Taken?: n/a

## 2021-02-20 ENCOUNTER — Inpatient Hospital Stay: Payer: Medicaid Other | Admitting: Hematology and Oncology

## 2021-02-20 ENCOUNTER — Ambulatory Visit (HOSPITAL_COMMUNITY)
Admission: RE | Admit: 2021-02-20 | Discharge: 2021-02-20 | Disposition: A | Discharge status: Home / Self Care | Payer: Medicaid Other | Source: Ambulatory Visit | Attending: Hematology and Oncology | Admitting: Hematology and Oncology

## 2021-02-20 ENCOUNTER — Inpatient Hospital Stay: Payer: Medicaid Other | Admitting: Nurse Practitioner

## 2021-02-20 ENCOUNTER — Inpatient Hospital Stay: Payer: Medicaid Other

## 2021-02-20 DIAGNOSIS — I509 Heart failure, unspecified: Secondary | ICD-10-CM | POA: Diagnosis present

## 2021-02-20 DIAGNOSIS — C50919 Malignant neoplasm of unspecified site of unspecified female breast: Secondary | ICD-10-CM | POA: Diagnosis not present

## 2021-02-20 DIAGNOSIS — C7951 Secondary malignant neoplasm of bone: Secondary | ICD-10-CM | POA: Diagnosis present

## 2021-02-20 DIAGNOSIS — F32A Depression, unspecified: Secondary | ICD-10-CM | POA: Diagnosis present

## 2021-02-20 DIAGNOSIS — R112 Nausea with vomiting, unspecified: Secondary | ICD-10-CM | POA: Diagnosis present

## 2021-02-20 DIAGNOSIS — G893 Neoplasm related pain (acute) (chronic): Secondary | ICD-10-CM | POA: Diagnosis not present

## 2021-02-20 DIAGNOSIS — R451 Restlessness and agitation: Secondary | ICD-10-CM | POA: Diagnosis present

## 2021-02-20 DIAGNOSIS — K920 Hematemesis: Secondary | ICD-10-CM | POA: Diagnosis present

## 2021-02-20 DIAGNOSIS — Z9013 Acquired absence of bilateral breasts and nipples: Secondary | ICD-10-CM | POA: Diagnosis not present

## 2021-02-20 DIAGNOSIS — C50812 Malignant neoplasm of overlapping sites of left female breast: Secondary | ICD-10-CM | POA: Diagnosis present

## 2021-02-20 DIAGNOSIS — Z87891 Personal history of nicotine dependence: Secondary | ICD-10-CM | POA: Diagnosis not present

## 2021-02-20 DIAGNOSIS — Z803 Family history of malignant neoplasm of breast: Secondary | ICD-10-CM | POA: Diagnosis not present

## 2021-02-20 DIAGNOSIS — Z20822 Contact with and (suspected) exposure to covid-19: Secondary | ICD-10-CM | POA: Diagnosis present

## 2021-02-20 DIAGNOSIS — R410 Disorientation, unspecified: Secondary | ICD-10-CM | POA: Diagnosis not present

## 2021-02-20 DIAGNOSIS — Z808 Family history of malignant neoplasm of other organs or systems: Secondary | ICD-10-CM | POA: Diagnosis not present

## 2021-02-20 DIAGNOSIS — Z170 Estrogen receptor positive status [ER+]: Secondary | ICD-10-CM

## 2021-02-20 DIAGNOSIS — K7290 Hepatic failure, unspecified without coma: Secondary | ICD-10-CM | POA: Diagnosis present

## 2021-02-20 DIAGNOSIS — Z7189 Other specified counseling: Secondary | ICD-10-CM | POA: Diagnosis not present

## 2021-02-20 DIAGNOSIS — C787 Secondary malignant neoplasm of liver and intrahepatic bile duct: Secondary | ICD-10-CM | POA: Diagnosis present

## 2021-02-20 DIAGNOSIS — Z800 Family history of malignant neoplasm of digestive organs: Secondary | ICD-10-CM | POA: Diagnosis not present

## 2021-02-20 DIAGNOSIS — R1011 Right upper quadrant pain: Secondary | ICD-10-CM | POA: Diagnosis present

## 2021-02-20 DIAGNOSIS — E872 Acidosis, unspecified: Secondary | ICD-10-CM | POA: Diagnosis present

## 2021-02-20 DIAGNOSIS — E860 Dehydration: Secondary | ICD-10-CM | POA: Diagnosis present

## 2021-02-20 DIAGNOSIS — D61818 Other pancytopenia: Secondary | ICD-10-CM | POA: Diagnosis present

## 2021-02-20 DIAGNOSIS — E7220 Disorder of urea cycle metabolism, unspecified: Secondary | ICD-10-CM | POA: Diagnosis present

## 2021-02-20 DIAGNOSIS — Z66 Do not resuscitate: Secondary | ICD-10-CM | POA: Diagnosis present

## 2021-02-20 DIAGNOSIS — Z515 Encounter for palliative care: Secondary | ICD-10-CM | POA: Diagnosis not present

## 2021-02-20 DIAGNOSIS — R509 Fever, unspecified: Secondary | ICD-10-CM | POA: Diagnosis not present

## 2021-02-20 LAB — LACTIC ACID, PLASMA: Lactic Acid, Venous: 3.4 mmol/L (ref 0.5–1.9)

## 2021-02-20 LAB — CBC
HCT: 29.4 % — ABNORMAL LOW (ref 36.0–46.0)
Hemoglobin: 9.4 g/dL — ABNORMAL LOW (ref 12.0–15.0)
MCH: 33.6 pg (ref 26.0–34.0)
MCHC: 32 g/dL (ref 30.0–36.0)
MCV: 105 fL — ABNORMAL HIGH (ref 80.0–100.0)
Platelets: 30 10*3/uL — ABNORMAL LOW (ref 150–400)
RBC: 2.8 MIL/uL — ABNORMAL LOW (ref 3.87–5.11)
RDW: 20.6 % — ABNORMAL HIGH (ref 11.5–15.5)
WBC: 3.1 10*3/uL — ABNORMAL LOW (ref 4.0–10.5)
nRBC: 64 % — ABNORMAL HIGH (ref 0.0–0.2)

## 2021-02-20 LAB — PROTIME-INR
INR: 1.7 — ABNORMAL HIGH (ref 0.8–1.2)
Prothrombin Time: 20.1 seconds — ABNORMAL HIGH (ref 11.4–15.2)

## 2021-02-20 LAB — DIFFERENTIAL
Abs Immature Granulocytes: 0.1 10*3/uL — ABNORMAL HIGH (ref 0.00–0.07)
Band Neutrophils: 6 %
Basophils Absolute: 0.1 10*3/uL (ref 0.0–0.1)
Basophils Relative: 3 %
Eosinophils Absolute: 0 10*3/uL (ref 0.0–0.5)
Eosinophils Relative: 0 %
Lymphocytes Relative: 48 %
Lymphs Abs: 1.6 10*3/uL (ref 0.7–4.0)
Metamyelocytes Relative: 4 %
Monocytes Absolute: 0 10*3/uL — ABNORMAL LOW (ref 0.1–1.0)
Monocytes Relative: 1 %
Neutro Abs: 1.5 10*3/uL — ABNORMAL LOW (ref 1.7–7.7)
Neutrophils Relative %: 38 %
WBC Morphology: >10

## 2021-02-20 LAB — COMPREHENSIVE METABOLIC PANEL
ALT: 122 U/L — ABNORMAL HIGH (ref 0–44)
AST: 549 U/L — ABNORMAL HIGH (ref 15–41)
Albumin: 2.6 g/dL — ABNORMAL LOW (ref 3.5–5.0)
Alkaline Phosphatase: 226 U/L — ABNORMAL HIGH (ref 38–126)
Anion gap: 10 (ref 5–15)
BUN: 11 mg/dL (ref 6–20)
CO2: 25 mmol/L (ref 22–32)
Calcium: 7.3 mg/dL — ABNORMAL LOW (ref 8.9–10.3)
Chloride: 101 mmol/L (ref 98–111)
Creatinine, Ser: 0.69 mg/dL (ref 0.44–1.00)
GFR, Estimated: >60 mL/min (ref >60)
Glucose, Bld: 113 mg/dL — ABNORMAL HIGH (ref 70–99)
Potassium: 3.5 mmol/L (ref 3.5–5.1)
Sodium: 136 mmol/L (ref 135–145)
Total Bilirubin: 13.1 mg/dL — ABNORMAL HIGH (ref 0.3–1.2)
Total Protein: 6.2 g/dL — ABNORMAL LOW (ref 6.5–8.1)

## 2021-02-20 LAB — SURGICAL PATHOLOGY

## 2021-02-20 MED ORDER — SENNOSIDES-DOCUSATE SODIUM 8.6-50 MG PO TABS: 1.0000 | ORAL_TABLET | Freq: Every evening | ORAL | Status: DC | PRN

## 2021-02-20 MED ORDER — OXYCODONE HCL 5 MG PO TABS
5.0000 mg | ORAL_TABLET | ORAL | Status: DC | PRN
  Administered 2021-02-20 – 2021-02-21 (×4): 5 mg via ORAL
  Filled 2021-02-20 (×4): qty 1

## 2021-02-20 MED ORDER — POLYETHYLENE GLYCOL 3350 17 G PO PACK
17.0000 g | PACK | Freq: Every day | ORAL | Status: DC
  Administered 2021-02-20 – 2021-02-22 (×3): 17 g via ORAL
  Filled 2021-02-20 (×4): qty 1

## 2021-02-20 MED ORDER — ONDANSETRON HCL 4 MG/2ML IJ SOLN
4.0000 mg | Freq: Four times a day (QID) | INTRAMUSCULAR | Status: DC | PRN
  Administered 2021-02-20 – 2021-02-23 (×3): 4 mg via INTRAVENOUS
  Filled 2021-02-20 (×3): qty 2

## 2021-02-20 MED ORDER — SODIUM CHLORIDE 0.9 % IV SOLN: INTRAVENOUS | Status: AC

## 2021-02-20 MED ORDER — LORAZEPAM 0.5 MG PO TABS
0.5000 mg | ORAL_TABLET | Freq: Every evening | ORAL | Status: DC | PRN
  Administered 2021-02-20: 0.5 mg via ORAL
  Filled 2021-02-20: qty 1

## 2021-02-20 MED ORDER — HYDROMORPHONE HCL 1 MG/ML IJ SOLN
0.5000 mg | INTRAMUSCULAR | Status: DC | PRN
  Administered 2021-02-20 (×5): 0.5 mg via INTRAVENOUS
  Filled 2021-02-20 (×5): qty 1

## 2021-02-20 MED ORDER — HYDROMORPHONE HCL 1 MG/ML IJ SOLN
1.0000 mg | INTRAMUSCULAR | Status: AC
  Administered 2021-02-20: 1 mg via INTRAVENOUS
  Filled 2021-02-20: qty 1

## 2021-02-20 MED ORDER — HYDROMORPHONE HCL 1 MG/ML IJ SOLN
1.0000 mg | INTRAMUSCULAR | Status: DC | PRN
  Administered 2021-02-20 – 2021-02-22 (×7): 1 mg via INTRAVENOUS
  Filled 2021-02-20 (×7): qty 1

## 2021-02-20 NOTE — Progress Notes (Addendum)
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  Patient states was feeling well for a few days after recent hospital admission; but over the last 3 days has been having more significant right upper quadrant and right flank abdominal pain.  She is reports nausea with nonbloody emesis and unable to keep any adequate oral intake or her medications down.  She reports she feels dehydrated and has no diarrhea.  Last bowel movement was 2 days ago.  Denies any bleeding.  Recently admitted 02/09/2021 - 02/11/2021 for abdominal pain and severe jaundice.  Liver MRI showed new diffuse hepatic metastases nearly completely replacing the hepatic parenchyma.  There is no evidence of obstruction.  Liver biopsy performed on 02/10/2021 shows metastatic carcinoma.  Patient has plan to start systemic chemotherapy with Enhertu on 02/20/2021 per her oncologist Dr. Payton Mccallum.  In the ED, BP 131/83, HR 118, RR 16, temperature 9 9.5 F, SPO2 100% on room air.  Sodium 135, potassium 3.7, chloride 97, CO2 25, glucose 144, BUN 13, creatinine 0.71, alkaline phosphatase 246, albumin 3.1, lipase 177, AST 592, ALT 144, total bilirubin 14.7.  Lactic acid 4.6.  WBC 3.4, hemoglobin 10.9, platelets 36.  hCG negative.  COVID-19 PCR negative.  Influenza A/B PCR negative.  CT abdomen/pelvis with no evidence of bowel obstruction, normal appendix, and hepatic metastases unchanged from recent MRI, multifocal lytic and sclerotic osseous metastases unchanged from previous imaging, small volume pelvic ascites without frank peritoneal nodularity/omental caking.  Patient was given 1 L NS, IV Dilaudid 0.5 mg x 2, Zofran.  The hospital service  was consulted for further evaluation management of intractable nausea/vomiting and worsening abdominal pain.   Assessment & Plan:   Principal Problem:   RUQ abdominal pain Active Problems:   Malignant neoplasm of overlapping sites of left breast in female, estrogen receptor positive (HCC)   Hepatic metastasis (HCC)   Elevated liver enzymes   Pancytopenia (HCC)   Cancer associated right upper quadrant/right flank pain Intractable nausea/vomiting Etiology likely secondary to her breast cancer with osseous and diffuse hepatic metastasis.  CT abdomen/pelvis without evidence of obstruction --Oxycodone 5 mg p.o. q4h PRN moderate pain --Dilaudid 0.5 mg IV q2h PRN severe pain or if cannot tolerate oral meds --NS at 100 mL/h --Clear liquid diet --Antiemetics, supportive care --Palliative care consultation  Severe hyperbilirubinemia with elevated LFTs and lipase: Etiology likely secondary to diffuse hepatic metastasis.  CT abdomen/pelvis without evidence of obstruction.  Total bilirubin up to 14.7. Lipase 177. --AST 592>549 --ALT 144>122 --Tbili 14.7>13.1 --Continue supportive care, IV fluid hydration, antiemetics, clear liquid diet --CMP daily, repeat lipase in am  Pancytopenia Likely secondary to malignancy/diffuse hepatic metastasis.  Platelet count 36 on admission.  Denies any bleeding.  Currently no indication for PRBC or platelet transfusion at this point. --Transfuse PRBC for hemoglobin < 7.0 or active bleeding --Transfuse for platelets < 20 or active bleeding  Lactic acidosis Lactic acid elevated 4.6 on admission.  Etiology likely secondary to severe dehydration in the setting of intractable nausea/vomiting.  No indication for infectious etiology at this time. --LA 4.6>>3.4 --Continue IV fluid hydration --Repeat lactic acid in a.m.  Malignant ER positive left-sided breast cancer with osseous and diffuse hepatic metastasis: Follows  with medical oncology, Dr. Katha Hamming  outpatient.  S/p chemotherapy, bilateral mastectomy, adjuvant radiation on active antiestrogen therapy.  Recently found to have diffuse hepatic metastasis.  Planning to start systemic treatment with Enhertu on 02/20/2021.  Unfortunately, likely very poor prognosis --Oncology/palliative care consult   DVT prophylaxis: SCDs Start: 02/20/21 0004   Code Status: Full Code Family Communication: Updated family present at bedside  Disposition Plan:  Level of care: Med-Surg Status is: Inpatient  Remains inpatient appropriate because: Intractable nausea/vomiting requiring IV fluid hydration, cancer associated pain requiring IV pain medication.    Consultants:  Palliative care consult: Pending Oncology consult: Pending  Procedures:  None  Antimicrobials:  None   Subjective: Patient seen examined at bedside, resting comfortably.  Remains in ED holding area.  Family present.  Continues with nausea, no further vomiting.  Abdominal pain slightly improved; but continues with mainly right upper quadrant discomfort.  States was not able to tolerate much oral intake outpatient including her home medications.  Denies fever, no chills.  No other specific complaints this morning.  Further denies headache, no dizziness, no chest pain, no shortness of breath, no diarrhea, no cough/congestion.  No acute events overnight per nursing staff.  Objective: Vitals:   02/20/21 0730 02/20/21 0800 02/20/21 0900 02/20/21 0930  BP: 120/71 105/71 124/73 112/69  Pulse: (!) 119 (!) 113 (!) 120 (!) 112  Resp:  16  18  Temp:      TempSrc:      SpO2: 97% 96% 99% 97%   No intake or output data in the 24 hours ending 02/20/21 1005 There were no vitals filed for this visit.  Examination:  General exam: Appears calm and comfortable  HEENT: Scleral icterus noted, dry mucous membranes Respiratory system: Clear to auscultation. Respiratory effort normal. Cardiovascular system: S1 & S2 heard, tachycardic, regular  rhythm. No JVD, murmurs, rubs, gallops or clicks. No pedal edema. Gastrointestinal system: TTP RUQ, no rebound/guarding/masses, bowel sounds present Central nervous system: Alert and oriented. No focal neurological deficits. Extremities: Symmetric 5 x 5 power. Skin: No rashes, lesions or ulcers Psychiatry: Judgement and insight appear normal. Mood & affect appropriate.     Data Reviewed: I have personally reviewed following labs and imaging studies  CBC: Recent Labs  Lab 02/19/21 2011 02/19/21 2350 02/20/21 0552  WBC 3.4*  --  3.1*  NEUTROABS CANCELLED BY LAB 1.5*  --   HGB 10.9*  --  9.4*  HCT 33.2*  --  29.4*  MCV 101.2*  --  105.0*  PLT 36*  --  30*   Basic Metabolic Panel: Recent Labs  Lab 02/19/21 2011 02/20/21 0552  NA 135 136  K 3.7 3.5  CL 97* 101  CO2 25 25  GLUCOSE 144* 113*  BUN 13 11  CREATININE 0.71 0.69  CALCIUM 8.1* 7.3*   GFR: Estimated Creatinine Clearance: 98.7 mL/min (by C-G formula based on SCr of 0.69 mg/dL). Liver Function Tests: Recent Labs  Lab 02/19/21 2011 02/20/21 0552  AST 592* 549*  ALT 144* 122*  ALKPHOS 246* 226*  BILITOT 14.7* 13.1*  PROT 7.5 6.2*  ALBUMIN 3.1* 2.6*   Recent Labs  Lab 02/19/21 2011  LIPASE 177*   No results for input(s): AMMONIA in the last 168 hours. Coagulation Profile: Recent Labs  Lab 02/20/21 0552  INR 1.7*   Cardiac Enzymes: No results for input(s): CKTOTAL, CKMB, CKMBINDEX, TROPONINI in the last 168 hours. BNP (last 3 results) No results for input(s): PROBNP in the last 8760 hours.  HbA1C: No results for input(s): HGBA1C in the last 72 hours. CBG: No results for input(s): GLUCAP in the last 168 hours. Lipid Profile: No results for input(s): CHOL, HDL, LDLCALC, TRIG, CHOLHDL, LDLDIRECT in the last 72 hours. Thyroid Function Tests: No results for input(s): TSH, T4TOTAL, FREET4, T3FREE, THYROIDAB in the last 72 hours. Anemia Panel: No results for input(s): VITAMINB12, FOLATE, FERRITIN,  TIBC, IRON, RETICCTPCT in the last 72 hours. Sepsis Labs: Recent Labs  Lab 02/19/21 2023 02/19/21 2234 02/20/21 0552  LATICACIDVEN 4.6* 3.5* 3.4*    Recent Results (from the past 240 hour(s))  Resp Panel by RT-PCR (Flu A&B, Covid) Nasopharyngeal Swab     Status: None   Collection Time: 02/19/21  8:00 PM   Specimen: Nasopharyngeal Swab; Nasopharyngeal(NP) swabs in vial transport medium  Result Value Ref Range Status   SARS Coronavirus 2 by RT PCR NEGATIVE NEGATIVE Final    Comment: (NOTE) SARS-CoV-2 target nucleic acids are NOT DETECTED.  The SARS-CoV-2 RNA is generally detectable in upper respiratory specimens during the acute phase of infection. The lowest concentration of SARS-CoV-2 viral copies this assay can detect is 138 copies/mL. A negative result does not preclude SARS-Cov-2 infection and should not be used as the sole basis for treatment or other patient management decisions. A negative result may occur with  improper specimen collection/handling, submission of specimen other than nasopharyngeal swab, presence of viral mutation(s) within the areas targeted by this assay, and inadequate number of viral copies(<138 copies/mL). A negative result must be combined with clinical observations, patient history, and epidemiological information. The expected result is Negative.  Fact Sheet for Patients:  EntrepreneurPulse.com.au  Fact Sheet for Healthcare Providers:  IncredibleEmployment.be  This test is no t yet approved or cleared by the [place] FDA and  has been authorized for detection and/or diagnosis of SARS-CoV-2 by FDA under an Emergency Use Authorization (EUA). This EUA will remain  in effect (meaning this test can be used) for the duration of the COVID-19 declaration under Section 564(b)(1) of the Act, 21 U.S.C.section 360bbb-3(b)(1), unless the authorization is terminated  or revoked sooner.       Influenza A by PCR  NEGATIVE NEGATIVE Final   Influenza B by PCR NEGATIVE NEGATIVE Final    Comment: (NOTE) The Xpert Xpress SARS-CoV-2/FLU/RSV plus assay is intended as an aid in the diagnosis of influenza from Nasopharyngeal swab specimens and should not be used as a sole basis for treatment. Nasal washings and aspirates are unacceptable for Xpert Xpress SARS-CoV-2/FLU/RSV testing.  Fact Sheet for Patients: EntrepreneurPulse.com.au  Fact Sheet for Healthcare Providers: IncredibleEmployment.be  This test is not yet approved or cleared by the [place] FDA and has been authorized for detection and/or diagnosis of SARS-CoV-2 by FDA under an Emergency Use Authorization (EUA). This EUA will remain in effect (meaning this test can be used) for the duration of the COVID-19 declaration under Section 564(b)(1) of the Act, 21 U.S.C. section 360bbb-3(b)(1), unless the authorization is terminated or revoked.  Performed at [facility], [place] [address] [place], [place] [address]          Radiology Studies: CT Abdomen Pelvis W Contrast  Result Date: 02/19/2021 CLINICAL DATA:  Vomiting, abdominal pain after starting new cancer medication. Breast cancer with hepatic metastases. EXAM: CT ABDOMEN AND PELVIS WITH CONTRAST TECHNIQUE: Multidetector CT imaging of the abdomen and pelvis was performed using the standard protocol following bolus administration of intravenous contrast. CONTRAST:  44mL OMNIPAQUE IOHEXOL 350 MG/ML SOLN COMPARISON:  MRI abdomen dated 02/09/2021. CT abdomen/pelvis dated 07/15/2020. FINDINGS: Lower chest: Lung bases are clear. Hepatobiliary: Innumerable/diffuse hepatic metastases, better evaluated on recent MR. Gallbladder is underdistended but grossly unremarkable. No intrahepatic or extrahepatic ductal dilatation. Pancreas: Within normal limits. Spleen: Within normal limits. Adrenals/Urinary Tract: Adrenal glands are within normal  limits. Kidneys are within normal limits.  No hydronephrosis. Bladder is within normal limits. Stomach/Bowel: Stomach is within normal limits. No evidence of bowel obstruction. Normal appendix (series 2/image 69). No colonic wall thickening or inflammatory changes. Vascular/Lymphatic: No evidence of abdominal aortic aneurysm. No suspicious abdominopelvic lymphadenopathy. Reproductive: Uterus is within normal limits. Bilateral ovaries are within normal limits. Other: Small volume pelvic ascites. No frank peritoneal nodularity/omental caking. Musculoskeletal: Multifocal lytic and sclerotic osseous metastases involving the visualized axial and appendicular skeleton, unchanged from recent MRI. IMPRESSION: No evidence of bowel obstruction.  Normal appendix. Innumerable hepatic metastases, unchanged from recent MRI. Multifocal lytic and sclerotic osseous metastases, unchanged from recent MRI. Small volume pelvic ascites. No frank peritoneal nodularity/omental caking. Electronically Signed   By: Julian Hy M.D.   On: 02/19/2021 23:23        Scheduled Meds:  polyethylene glycol  17 g Oral Daily   Continuous Infusions:  sodium chloride 100 mL/hr at 02/20/21 0928     LOS: 0 days    Time spent: 43 minutes spent on chart review, discussion with nursing staff, consultants, updating family and interview/physical exam; more than 50% of that time was spent in counseling and/or coordination of care.    Kellen Hover J [place] DO [facility] Available via Epic secure chat 7am-7pm After these hours, please refer to coverage provider listed on [email or web address] 02/20/2021, 10:05 AM

## 2021-02-20 NOTE — Progress Notes (Signed)
   02/20/21 2107  Treat  MEWS Interventions Escalated (See documentation below)  Pain Scale 0-10  Pain Score 7  Pain Type Acute pain  Pain Location Abdomen  Pain Orientation Right  Pain Descriptors / Indicators Aching  Pain Frequency Intermittent  Pain Onset On-going  Patients Stated Pain Goal 1  Pain Intervention(s) MD notified (Comment) (patient in red mews due to temp and pulse)  Multiple Pain Sites No  Breathing 0  Negative Vocalization 1  Facial Expression 1  Consolability 0  Complains of Restless  Interventions Reposition  Patients response to intervention Unchanged  Take Vital Signs  Increase Vital Sign Frequency  Red: Q 1hr X 4 then Q 4hr X 4, if remains red, continue Q 4hrs  Escalate  MEWS: Escalate Red: discuss with charge nurse/RN and provider, consider discussing with RRT  Notify: Charge Nurse/RN  Name of Charge Nurse/RN Notified Daleen Bo RN  Date Charge Nurse/RN Notified 02/20/21  Time Charge Nurse/RN Notified 2109  Notify: Provider  Provider Name/Title Daniels,James NP  Date Provider Notified 02/20/21  Time Provider Notified 2112  Notification Type Page (secure chat)  Notification Reason Change in status (red mews)  Patient in red mews due to temp and pulse. Informed NP of administration of pain medication and may need an antipyretic to lower temperature. Charge nurse notified as well.

## 2021-02-20 NOTE — Progress Notes (Signed)
   02/20/21 1842  Assess: MEWS Score  Temp 99 F (37.2 C)  BP 124/78  Pulse Rate (!) 122  Resp 16  SpO2 97 %  O2 Device Room Air  Assess: MEWS Score  MEWS Temp 0  MEWS Systolic 0  MEWS Pulse 2  MEWS RR 0  MEWS LOC 0  MEWS Score 2  MEWS Score Color Yellow  Assess: if the MEWS score is Yellow or Red  Were vital signs taken at a resting state? Yes  Focused Assessment No change from prior assessment  Does the patient meet 2 or more of the SIRS criteria? No  MEWS guidelines implemented *See Row Information* Yes  Take Vital Signs  Increase Vital Sign Frequency  Yellow: Q 2hr X 2 then Q 4hr X 2, if remains yellow, continue Q 4hrs  Escalate  MEWS: Escalate Yellow: discuss with charge nurse/RN and consider discussing with provider and RRT  Notify: Charge Nurse/RN  Name of Charge Nurse/RN Notified Stephanie, RN  Date Charge Nurse/RN Notified 02/20/21  Time Charge Nurse/RN Notified 1900  Document  Patient Outcome Not stable and remains on department  Progress note created (see row info) Yes  Assess: SIRS CRITERIA  SIRS Temperature  0  SIRS Pulse 1  SIRS Respirations  0  SIRS WBC 0  SIRS Score Sum  1

## 2021-02-21 ENCOUNTER — Other Ambulatory Visit: Payer: Self-pay | Admitting: Pharmacist

## 2021-02-21 DIAGNOSIS — G893 Neoplasm related pain (acute) (chronic): Principal | ICD-10-CM

## 2021-02-21 DIAGNOSIS — C50919 Malignant neoplasm of unspecified site of unspecified female breast: Secondary | ICD-10-CM

## 2021-02-21 DIAGNOSIS — Z7189 Other specified counseling: Secondary | ICD-10-CM

## 2021-02-21 DIAGNOSIS — Z515 Encounter for palliative care: Secondary | ICD-10-CM

## 2021-02-21 DIAGNOSIS — R1011 Right upper quadrant pain: Secondary | ICD-10-CM

## 2021-02-21 LAB — COMPREHENSIVE METABOLIC PANEL
ALT: 143 U/L — ABNORMAL HIGH (ref 0–44)
AST: 982 U/L — ABNORMAL HIGH (ref 15–41)
Albumin: 2.6 g/dL — ABNORMAL LOW (ref 3.5–5.0)
Alkaline Phosphatase: 229 U/L — ABNORMAL HIGH (ref 38–126)
Anion gap: 12 (ref 5–15)
BUN: 9 mg/dL (ref 6–20)
CO2: 23 mmol/L (ref 22–32)
Calcium: 7.2 mg/dL — ABNORMAL LOW (ref 8.9–10.3)
Chloride: 99 mmol/L (ref 98–111)
Creatinine, Ser: 0.59 mg/dL (ref 0.44–1.00)
GFR, Estimated: >60 mL/min (ref >60)
Glucose, Bld: 113 mg/dL — ABNORMAL HIGH (ref 70–99)
Potassium: 3.5 mmol/L (ref 3.5–5.1)
Sodium: 134 mmol/L — ABNORMAL LOW (ref 135–145)
Total Bilirubin: 13.6 mg/dL — ABNORMAL HIGH (ref 0.3–1.2)
Total Protein: 6.1 g/dL — ABNORMAL LOW (ref 6.5–8.1)

## 2021-02-21 LAB — LACTIC ACID, PLASMA: Lactic Acid, Venous: 3.7 mmol/L (ref 0.5–1.9)

## 2021-02-21 LAB — LIPASE, BLOOD: Lipase: 265 U/L — ABNORMAL HIGH (ref 11–51)

## 2021-02-21 LAB — MAGNESIUM: Magnesium: 2.8 mg/dL — ABNORMAL HIGH (ref 1.7–2.4)

## 2021-02-21 MED ORDER — SENNOSIDES-DOCUSATE SODIUM 8.6-50 MG PO TABS: 1.0000 | ORAL_TABLET | Freq: Two times a day (BID) | ORAL | Status: DC

## 2021-02-21 MED ORDER — SODIUM CHLORIDE 0.9 % IV SOLN
12.5000 mg | Freq: Four times a day (QID) | INTRAVENOUS | Status: DC | PRN
  Administered 2021-02-22: 04:00:00 12.5 mg via INTRAVENOUS
  Filled 2021-02-21: qty 0.5
  Filled 2021-02-21: qty 12.5

## 2021-02-21 MED ORDER — OXYCODONE HCL ER 20 MG PO T12A
20.0000 mg | EXTENDED_RELEASE_TABLET | Freq: Two times a day (BID) | ORAL | Status: DC
  Administered 2021-02-21 – 2021-02-23 (×6): 20 mg via ORAL
  Filled 2021-02-21 (×7): qty 1

## 2021-02-21 MED ORDER — PALONOSETRON HCL INJECTION 0.25 MG/5ML
0.2500 mg | Freq: Once | INTRAVENOUS | Status: AC
  Administered 2021-02-21: 0.25 mg via INTRAVENOUS
  Filled 2021-02-21: qty 5

## 2021-02-21 MED ORDER — SODIUM CHLORIDE 0.9 % IV SOLN
10.0000 mg | Freq: Once | INTRAVENOUS | Status: AC
  Administered 2021-02-21: 10 mg via INTRAVENOUS
  Filled 2021-02-21: qty 1

## 2021-02-21 MED ORDER — ALTEPLASE 2 MG IJ SOLR
2.0000 mg | Freq: Once | INTRAMUSCULAR | Status: AC
  Administered 2021-02-21: 2 mg
  Filled 2021-02-21: qty 2

## 2021-02-21 MED ORDER — PROCHLORPERAZINE MALEATE 10 MG PO TABS
10.0000 mg | ORAL_TABLET | Freq: Four times a day (QID) | ORAL | Status: DC | PRN
  Administered 2021-02-21: 10 mg via ORAL
  Filled 2021-02-21: qty 1

## 2021-02-21 MED ORDER — LACTULOSE 10 GM/15ML PO SOLN: 30.0000 g | Freq: Every day | ORAL | Status: DC | PRN

## 2021-02-21 MED ORDER — FAM-TRASTUZUMAB DERUXTECAN-NXKI CHEMO 100 MG IV SOLR
3.2000 mg/kg | Freq: Once | INTRAVENOUS | Status: AC
  Administered 2021-02-21: 256 mg via INTRAVENOUS
  Filled 2021-02-21: qty 12.8

## 2021-02-21 MED ORDER — DIPHENHYDRAMINE HCL 25 MG PO CAPS
50.0000 mg | ORAL_CAPSULE | Freq: Once | ORAL | Status: AC
  Administered 2021-02-21: 50 mg via ORAL
  Filled 2021-02-21: qty 2

## 2021-02-21 MED ORDER — ACETAMINOPHEN 325 MG PO TABS
650.0000 mg | ORAL_TABLET | Freq: Once | ORAL | Status: AC
  Administered 2021-02-21: 650 mg via ORAL
  Filled 2021-02-21: qty 2

## 2021-02-21 MED ORDER — LIDOCAINE 5 % EX PTCH
1.0000 | MEDICATED_PATCH | CUTANEOUS | Status: DC
  Administered 2021-02-22 – 2021-02-24 (×3): 1 via TRANSDERMAL
  Filled 2021-02-21 (×3): qty 1

## 2021-02-21 MED ORDER — LIDOCAINE 5 % EX PTCH
1.0000 | MEDICATED_PATCH | Freq: Once | CUTANEOUS | Status: AC
  Administered 2021-02-21: 1 via TRANSDERMAL
  Filled 2021-02-21: qty 1

## 2021-02-21 MED ORDER — LIDOCAINE 5 % EX PTCH
1.0000 | MEDICATED_PATCH | CUTANEOUS | Status: DC
Start: 2021-02-21 — End: 2021-02-21
  Filled 2021-02-21: qty 1

## 2021-02-21 MED ORDER — SENNA 8.6 MG PO TABS
1.0000 | ORAL_TABLET | Freq: Two times a day (BID) | ORAL | Status: DC
  Administered 2021-02-21 – 2021-02-22 (×3): 8.6 mg via ORAL
  Filled 2021-02-21 (×3): qty 1

## 2021-02-21 MED ORDER — KETOROLAC TROMETHAMINE 15 MG/ML IJ SOLN
15.0000 mg | Freq: Four times a day (QID) | INTRAMUSCULAR | Status: DC | PRN
  Administered 2021-02-21 – 2021-02-24 (×2): 15 mg via INTRAVENOUS
  Filled 2021-02-21 (×2): qty 1

## 2021-02-21 MED ORDER — DEXTROSE 5 % IV SOLN: Freq: Once | INTRAVENOUS | Status: AC

## 2021-02-21 NOTE — Progress Notes (Signed)
°   02/21/21 5027  Assess: MEWS Score  Temp 98.2 F (36.8 C)  BP 124/72  Pulse Rate (!) 125  Resp 18  SpO2 100 %  O2 Device Room Air  Assess: MEWS Score  MEWS Temp 0  MEWS Systolic 0  MEWS Pulse 2  MEWS RR 0  MEWS LOC 0  MEWS Score 2  MEWS Score Color Yellow  Assess: if the MEWS score is Yellow or Red  Were vital signs taken at a resting state? Yes  Focused Assessment No change from prior assessment  Does the patient meet 2 or more of the SIRS criteria? No  MEWS guidelines implemented *See Row Information* No, previously red, continue vital signs every 4 hours  Treat  Pain Scale 0-10  Pain Score 5  Pain Type Acute pain  Pain Location Abdomen  Pain Orientation Right  Take Vital Signs  Increase Vital Sign Frequency  Yellow: Q 2hr X 2 then Q 4hr X 2, if remains yellow, continue Q 4hrs  Escalate  MEWS: Escalate Yellow: discuss with charge nurse/RN and consider discussing with provider and RRT  Notify: Charge Nurse/RN  Name of Charge Nurse/RN Notified Regina RN  Date Charge Nurse/RN Notified 02/21/21  Time Charge Nurse/RN Notified 0815  Notify: Provider  Provider Name/Title Dr. [place]  Date Provider Notified 02/21/21  Time Provider Notified [phone]  Notification Type Face-to-face  Notification Reason Other (Comment) (high HR)  Provider response Other (Comment) (discussing an order  for HR control)  Date of Provider Response 02/21/21  Document  Patient Outcome Not stable and remains on department  Progress note created (see row info) Yes  Assess: SIRS CRITERIA  SIRS Temperature  0  SIRS Pulse 1  SIRS Respirations  0  SIRS WBC 0  SIRS Score Sum  1

## 2021-02-21 NOTE — Progress Notes (Signed)
Oncology I saw the patient this morning and discussed oncology care.  Her husband was also in the room. She was supposed to start first cycle of palliative chemotherapy yesterday but she has been admitted to the hospital with abdominal pain and nausea.  This morning she is feeling a lot better.  Plan: Plan to initiate first cycle of chemotherapy with Enhertu in the hospital today. Hyperbilirubinemia: Unfortunately this is rapidly progressing metastatic breast cancer and therefore we have to start treatment ASAP if she has any chance of improving.

## 2021-02-21 NOTE — Progress Notes (Incomplete)
Patient Care Team: William Dalton, NP as PCP - General (Nurse Practitioner) Pershing Proud, RN as Oncology Nurse Navigator Donnelly Angelica, RN as Oncology Nurse Navigator Emelia Loron, MD as Surgeon (General Surgery) Serena Croissant, MD as Medical Oncologist (Hematology and Oncology)  DIAGNOSIS: No diagnosis found.  SUMMARY OF ONCOLOGIC HISTORY: Oncology History  Malignant neoplasm of overlapping sites of left breast in female, estrogen receptor positive (HCC)  02/26/2019 Initial Diagnosis   Pregnant lady with 70-month history of left breast swelling and skin thickening, mammogram showed pleomorphic calcifications 8 cm, axilla lymph node biopsy positive ER 60%, PR 60%, Ki-67 50%, HER-2 negative ratio 1.25, anterior and posterior end of the calcifications biopsied: 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 ER 50%, PR 30%, Ki-67 50%, HER-2 negative ratio 1.03   03/04/2019 Cancer Staging   Staging form: Breast, AJCC 8th Edition - Clinical stage from 03/04/2019: Stage IIIB (cT4, cN1, cM0, G3, ER+, PR+, HER2-) - Signed by Serena Croissant, MD on 03/04/2019     Neo-Adjuvant Chemotherapy   Adriamycin and Cytoxan x 4 given every three weeks without Neulasta support., followed by weekly Taxol x 12 versus Taxotere Cytoxan after her delivery   04/15/2019 Genetic Testing   Negative genetic testing:  No pathogenic variants detected on the [facility] Breast Cancer Guidelines-Based Panel or the Common Hereditary Cancers Panel. The report date is 04/15/2019.  The Breast Cancer Guidelines-Based panel offered by [facility] includes sequencing and rearrangement analysis for the following 11 genes:  ATM, BRCA1, BRCA2, CDH1, CHEK2, NBN, NF1, PALB2, PTEN, STK11 and TP53.  The Common Hereditary Cancers Panel offered by [facility] includes sequencing and/or deletion duplication testing of the following 48 genes: APC, ATM, AXIN2, BARD1, BMPR1A, BRCA1, BRCA2, BRIP1, CDH1, CDK4, CDKN2A (p14ARF), CDKN2A (p16INK4a),  CHEK2, CTNNA1, DICER1, EPCAM (Deletion/duplication testing only), GREM1 (promoter region deletion/duplication testing only), KIT, MEN1, MLH1, MSH2, MSH3, MSH6, MUTYH, NBN, NF1, NHTL1, PALB2, PDGFRA, PMS2, POLD1, POLE, PTEN, RAD50, RAD51C, RAD51D, RNF43, SDHB, SDHC, SDHD, SMAD4, SMARCA4. STK11, TP53, TSC1, TSC2, and VHL.  The following genes were evaluated for sequence changes only: SDHA and HOXB13 c.251G>A variant only.    10/08/2019 Surgery   Bilateral mastectomies Dwain Sarna):  Right mastectomy: Multifocal grade 2 IDC 0.8 cm and 0.6 cm with high-grade DCIS, margins negative, 0/4 lymph nodes ER 95%, PR 5%, Ki-67 10%, HER-2 negative;  Left mastectomy: IDC 0.6 cm, LVI, margins negative, 1/4 lymph nodes positive, ER 60%, PR 60%, HER-2 negative, Ki-67 50% RCB class II   11/30/2019 - 01/08/2020 Radiation Therapy   Adjuvant radiation    Anti-estrogen oral therapy   Ibrance Letrozole, Renato Gails   02/21/2021 -  Chemotherapy   Patient is on Treatment Plan : BREAST METASTATIC fam-trastuzumab deruxtecan-nxki (Enhertu) q21d     Metastatic breast cancer (HCC)  02/13/2021 Initial Diagnosis   Metastatic breast cancer (HCC)   02/21/2021 -  Chemotherapy   Patient is on Treatment Plan : BREAST METASTATIC fam-trastuzumab deruxtecan-nxki (Enhertu) q21d       CHIEF COMPLIANT: Cycle 1 Enhertu  INTERVAL HISTORY: Melissa [name] is a 30 y.o. with above-mentioned history of metastatic breast cancer with osseous metastases who underwent treatment with Faslodex, letrozole,   Ibrance plus Zoladex along with Rivka Barbara, currently on chemotherapy with Enhertu. She presents to the clinic today for treatment.   ALLERGIES:  has No Known Allergies.  MEDICATIONS:  No current facility-administered medications for this visit.   No current outpatient medications on file.   Facility-Administered Medications Ordered in Other Visits  Medication  Dose Route Frequency Provider Last Rate Last Admin   HYDROmorphone (DILAUDID)  injection 1 mg  1 mg Intravenous Q2H PRN [place] Donnamarie Poag, DO   1 mg at 02/21/21 1237   ketorolac (TORADOL) 15 MG/ML injection 15 mg  15 mg Intravenous Q6H PRN [place] Eric J, DO   15 mg at 02/21/21 0816   lactulose (CHRONULAC) 10 GM/15ML solution 30 g  30 g Oral Daily PRN Pershing Proud, NP       lidocaine (LIDODERM) 5 % 1 patch  1 patch Transdermal Once Pershing Proud, NP   1 patch at 02/21/21 1739   [START ON 02/22/2021] lidocaine (LIDODERM) 5 % 1 patch  1 patch Transdermal R42H Pershing Proud, NP       LORazepam (ATIVAN) tablet 0.5 mg  0.5 mg Oral QHS PRN Zada Finders R, MD   0.5 mg at 02/20/21 0045   ondansetron (ZOFRAN) injection 4 mg  4 mg Intravenous Q6H PRN Dimple Nanas, RPH   4 mg at 02/20/21 1532   oxyCODONE (Oxy IR/ROXICODONE) immediate release tablet 5 mg  5 mg Oral Q4H PRN Zada Finders R, MD   5 mg at 02/21/21 1032   oxyCODONE (OXYCONTIN) 12 hr tablet 20 mg  20 mg Oral C62B Vinie Sill C, NP   20 mg at 02/21/21 1237   polyethylene glycol (MIRALAX / GLYCOLAX) packet 17 g  17 g Oral Daily Zada Finders R, MD   17 g at 02/21/21 1032   prochlorperazine (COMPAZINE) tablet 10 mg  10 mg Oral Q6H PRN Nicholas Lose, MD   10 mg at 02/21/21 1739   senna (SENOKOT) tablet 8.6 mg  1 tablet Oral BID Pershing Proud, NP   8.6 mg at 02/21/21 1237    PHYSICAL EXAMINATION: ECOG PERFORMANCE STATUS: {CHL ONC ECOG PS:[phone]}  There were no vitals filed for this visit. There were no vitals filed for this visit.  LABORATORY DATA:  I have reviewed the data as listed CMP Latest Ref Rng & Units 02/21/2021 02/20/2021 02/19/2021  Glucose 70 - 99 mg/dL 113(H) 113(H) 144(H)  BUN 6 - 20 mg/dL $Remove'9 11 13  'tjESZUD$ Creatinine 0.44 - 1.00 mg/dL 0.59 0.69 0.71  Sodium 135 - 145 mmol/L 134(L) 136 135  Potassium 3.5 - 5.1 mmol/L 3.5 3.5 3.7  Chloride 98 - 111 mmol/L 99 101 97(L)  CO2 22 - 32 mmol/L $RemoveB'23 25 25  'qFBCnKGm$ Calcium 8.9 - 10.3 mg/dL 7.2(L) 7.3(L) 8.1(L)  Total Protein 6.5 - 8.1 g/dL 6.1(L) 6.2(L)  7.5  Total Bilirubin 0.3 - 1.2 mg/dL 13.6(H) 13.1(H) 14.7(H)  Alkaline Phos 38 - 126 U/L 229(H) 226(H) 246(H)  AST 15 - 41 U/L 982(H) 549(H) 592(H)  ALT 0 - 44 U/L 143(H) 122(H) 144(H)    Lab Results  Component Value Date   WBC 3.3 (L) 02/21/2021   HGB 9.1 (L) 02/21/2021   HCT 28.3 (L) 02/21/2021   MCV 104.8 (H) 02/21/2021   PLT 24 (LL) 02/21/2021   NEUTROABS 1.5 (L) 02/19/2021    ASSESSMENT & PLAN:  No problem-specific Assessment & Plan notes found for this encounter.    No orders of the defined types were placed in this encounter.  The patient has a good understanding of the overall plan. she agrees with it. she will call with any problems that may develop before the next visit here.  Total time spent: *** mins including face to face time and time spent for planning, charting and coordination of care  Vinay K  Lindi Adie, MD, MPH 02/21/2021  I, Thana Ates, am acting as scribe for Dr. Nicholas Lose.  {insert scribe attestation}

## 2021-02-21 NOTE — Progress Notes (Signed)
PROGRESS NOTE    Melissa [name]  YNW:[id number] DOB: 07-Jan-1991 DOA: 02/19/2021 PCP: [name], NP    Brief Narrative:  Melissa [name] is a 30 year old female with past medical history significant for malignant left-sided breast cancer with osseous and diffuse hepatic metastases who presented to [facility] ED on 12/11 with abdominal pain and intractable nausea/vomiting.  Patient states was feeling well for a few days after recent hospital admission; but over the last 3 days has been having more significant right upper quadrant and right flank abdominal pain.  She is reports nausea with nonbloody emesis and unable to keep any adequate oral intake or her medications down.  She reports she feels dehydrated and has no diarrhea.  Last bowel movement was 2 days ago.  Denies any bleeding.  Recently admitted 02/09/2021 - 02/11/2021 for abdominal pain and severe jaundice.  Liver MRI showed new diffuse hepatic metastases nearly completely replacing the hepatic parenchyma.  There is no evidence of obstruction.  Liver biopsy performed on 02/10/2021 shows metastatic carcinoma.  Patient has plan to start systemic chemotherapy with Enhertu on 02/20/2021 per her oncologist Dr. Payton Mccallum.  In the ED, BP 131/83, HR 118, RR 16, temperature 9 9.5 F, SPO2 100% on room air.  Sodium 135, potassium 3.7, chloride 97, CO2 25, glucose 144, BUN 13, creatinine 0.71, alkaline phosphatase 246, albumin 3.1, lipase 177, AST 592, ALT 144, total bilirubin 14.7.  Lactic acid 4.6.  WBC 3.4, hemoglobin 10.9, platelets 36.  hCG negative.  COVID-19 PCR negative.  Influenza A/B PCR negative.  CT abdomen/pelvis with no evidence of bowel obstruction, normal appendix, and hepatic metastases unchanged from recent MRI, multifocal lytic and sclerotic osseous metastases unchanged from previous imaging, small volume pelvic ascites without frank peritoneal nodularity/omental caking.  Patient was given 1 L NS, IV Dilaudid 0.5 mg x 2, Zofran.  The hospital service  was consulted for further evaluation management of intractable nausea/vomiting and worsening abdominal pain.   Assessment & Plan:   Principal Problem:   RUQ abdominal pain Active Problems:   Malignant neoplasm of overlapping sites of left breast in female, estrogen receptor positive (HCC)   Hepatic metastasis (HCC)   Elevated liver enzymes   Pancytopenia (HCC)   Intractable nausea and vomiting   Cancer associated right upper quadrant/right flank pain Intractable nausea/vomiting Etiology likely secondary to her breast cancer with osseous and diffuse hepatic metastasis.  CT abdomen/pelvis without evidence of obstruction --Oxycontin 20mg  PO q12h --Oxycodone 5 mg p.o. q4h PRN moderate pain --Dilaudid 0.5 mg IV q2h PRN severe pain or if cannot tolerate oral meds --soft diet --Antiemetics, supportive care --Palliative care following  Severe hyperbilirubinemia with elevated LFTs and lipase: Etiology likely secondary to diffuse hepatic metastasis.  CT abdomen/pelvis without evidence of obstruction.  Total bilirubin up to 14.7. Lipase 177. --AST [phone] --ALT 144>122>143 --Tbili 14.7>13.1>13.6 --Continue supportive care,  --CMP daily  Pancytopenia Likely secondary to malignancy/diffuse hepatic metastasis.  Platelet count 36 on admission.  Denies any bleeding.  Currently no indication for PRBC or platelet transfusion at this point. --Transfuse PRBC for hemoglobin < 8.0 or active bleeding --Transfuse for platelets < 10 or active bleeding  Lactic acidosis Lactic acid elevated 4.6 on admission.  Etiology likely secondary to severe dehydration in the setting of intractable nausea/vomiting.  No indication for infectious etiology at this time. --LA 4.6>>3.4  Malignant ER positive left-sided breast cancer with osseous and diffuse hepatic metastasis: Follows with medical oncology, Dr. Katha Hamming outpatient.  S/p chemotherapy, bilateral mastectomy, adjuvant radiation on  active antiestrogen  therapy.  Recently found to have diffuse hepatic metastasis.  Planning to start systemic treatment with Enhertu on 02/20/2021.  Unfortunately, likely very poor prognosis --Oncology/palliative care following --Plan to start chemotherapy inpatient today   DVT prophylaxis: SCDs Start: 02/20/21 0004   Code Status: Full Code Family Communication: Updated husband present at bedside this morning  Disposition Plan:  Level of care: Med-Surg Status is: Inpatient  Remains inpatient appropriate because: Intractable nausea/vomiting requiring IV fluid hydration, cancer associated pain requiring IV pain medication.    Consultants:  Palliative care  Oncology - Dr. Lindi Adie  Procedures:  None  Antimicrobials:  None   Subjective: Patient seen examined at bedside, resting comfortably.  Patient states abdominal pain improved and currently tolerating diet.  Patient did have fever and elevated heart rate this morning, likely secondary to tumor burden within hepatic parenchyma.  Seen by oncologist morning with plans to start chemotherapy inpatient.  No other specific complaints this morning.  Denies headache, no dizziness, no chest pain, no shortness of breath, no diarrhea, no cough/congestion.  No acute events overnight per nursing staff.  Objective: Vitals:   02/20/21 2308 02/21/21 0034 02/21/21 0429 02/21/21 0812  BP: 119/71 110/70 108/74 124/72  Pulse: (!) 136 (!) 132 (!) 136 (!) 125  Resp: 18 18 18 18   Temp: (!) 100.6 F (38.1 C) 99.8 F (37.7 C) (!) 100.8 F (38.2 C) 98.2 F (36.8 C)  TempSrc: Oral Oral Oral Oral  SpO2: 95% 95% 96% 100%  Weight:      Height:        Intake/Output Summary (Last 24 hours) at 02/21/2021 1243 Last data filed at 02/21/2021 1016 Gross per 24 hour  Intake 480 ml  Output --  Net 480 ml   Filed Weights   02/20/21 1837  Weight: 81.2 kg    Examination:  General exam: Appears calm and comfortable, chronically ill in appearance HEENT: Scleral icterus  noted, dry mucous membranes Respiratory system: Clear to auscultation. Respiratory effort normal. Cardiovascular system: S1 & S2 heard, tachycardic, regular rhythm. No JVD, murmurs, rubs, gallops or clicks. No pedal edema. Gastrointestinal system: TTP RUQ, no rebound/guarding/masses, bowel sounds present Central nervous system: Alert and oriented. No focal neurological deficits. Extremities: Symmetric 5 x 5 power. Skin: No rashes, lesions or ulcers Psychiatry: Judgement and insight appear normal. Mood & affect appropriate.     Data Reviewed: I have personally reviewed following labs and imaging studies  CBC: Recent Labs  Lab 02/19/21 2011 02/19/21 2350 02/20/21 0552 02/21/21 0544  WBC 3.4*  --  3.1* 3.3*  NEUTROABS CANCELLED BY LAB 1.5*  --   --   HGB 10.9*  --  9.4* 9.1*  HCT 33.2*  --  29.4* 28.3*  MCV 101.2*  --  105.0* 104.8*  PLT 36*  --  30* 24*   Basic Metabolic Panel: Recent Labs  Lab 02/19/21 2011 02/20/21 0552 02/21/21 0544  NA 135 136 134*  K 3.7 3.5 3.5  CL 97* 101 99  CO2 25 25 23   GLUCOSE 144* 113* 113*  BUN 13 11 9   CREATININE 0.71 0.69 0.59  CALCIUM 8.1* 7.3* 7.2*  MG  --   --  2.8*   GFR: Estimated Creatinine Clearance: 99.6 mL/min (by C-G formula based on SCr of 0.59 mg/dL). Liver Function Tests: Recent Labs  Lab 02/19/21 2011 02/20/21 0552 02/21/21 0544  AST 592* 549* 982*  ALT 144* 122* 143*  ALKPHOS 246* 226* 229*  BILITOT 14.7* 13.1* 13.6*  PROT 7.5 6.2* 6.1*  ALBUMIN 3.1* 2.6* 2.6*   Recent Labs  Lab 02/19/21 2011 02/21/21 0544  LIPASE 177* 265*   No results for input(s): AMMONIA in the last 168 hours. Coagulation Profile: Recent Labs  Lab 02/20/21 0552  INR 1.7*   Cardiac Enzymes: No results for input(s): CKTOTAL, CKMB, CKMBINDEX, TROPONINI in the last 168 hours. BNP (last 3 results) No results for input(s): PROBNP in the last 8760 hours. HbA1C: No results for input(s): HGBA1C in the last 72 hours. CBG: No results  for input(s): GLUCAP in the last 168 hours. Lipid Profile: No results for input(s): CHOL, HDL, LDLCALC, TRIG, CHOLHDL, LDLDIRECT in the last 72 hours. Thyroid Function Tests: No results for input(s): TSH, T4TOTAL, FREET4, T3FREE, THYROIDAB in the last 72 hours. Anemia Panel: No results for input(s): VITAMINB12, FOLATE, FERRITIN, TIBC, IRON, RETICCTPCT in the last 72 hours. Sepsis Labs: Recent Labs  Lab 02/19/21 2023 02/19/21 2234 02/20/21 0552 02/21/21 0544  LATICACIDVEN 4.6* 3.5* 3.4* 3.7*    Recent Results (from the past 240 hour(s))  Resp Panel by RT-PCR (Flu A&B, Covid) Nasopharyngeal Swab     Status: None   Collection Time: 02/19/21  8:00 PM   Specimen: Nasopharyngeal Swab; Nasopharyngeal(NP) swabs in vial transport medium  Result Value Ref Range Status   SARS Coronavirus 2 by RT PCR NEGATIVE NEGATIVE Final    Comment: (NOTE) SARS-CoV-2 target nucleic acids are NOT DETECTED.  The SARS-CoV-2 RNA is generally detectable in upper respiratory specimens during the acute phase of infection. The lowest concentration of SARS-CoV-2 viral copies this assay can detect is 138 copies/mL. A negative result does not preclude SARS-Cov-2 infection and should not be used as the sole basis for treatment or other patient management decisions. A negative result may occur with  improper specimen collection/handling, submission of specimen other than nasopharyngeal swab, presence of viral mutation(s) within the areas targeted by this assay, and inadequate number of viral copies(<138 copies/mL). A negative result must be combined with clinical observations, patient history, and epidemiological information. The expected result is Negative.  Fact Sheet for Patients:  EntrepreneurPulse.com.au  Fact Sheet for Healthcare Providers:  IncredibleEmployment.be  This test is no t yet approved or cleared by the [place] FDA and  has been authorized for  detection and/or diagnosis of SARS-CoV-2 by FDA under an Emergency Use Authorization (EUA). This EUA will remain  in effect (meaning this test can be used) for the duration of the COVID-19 declaration under Section 564(b)(1) of the Act, 21 U.S.C.section 360bbb-3(b)(1), unless the authorization is terminated  or revoked sooner.       Influenza A by PCR NEGATIVE NEGATIVE Final   Influenza B by PCR NEGATIVE NEGATIVE Final    Comment: (NOTE) The Xpert Xpress SARS-CoV-2/FLU/RSV plus assay is intended as an aid in the diagnosis of influenza from Nasopharyngeal swab specimens and should not be used as a sole basis for treatment. Nasal washings and aspirates are unacceptable for Xpert Xpress SARS-CoV-2/FLU/RSV testing.  Fact Sheet for Patients: EntrepreneurPulse.com.au  Fact Sheet for Healthcare Providers: IncredibleEmployment.be  This test is not yet approved or cleared by the [place] FDA and has been authorized for detection and/or diagnosis of SARS-CoV-2 by FDA under an Emergency Use Authorization (EUA). This EUA will remain in effect (meaning this test can be used) for the duration of the COVID-19 declaration under Section 564(b)(1) of the Act, 21 U.S.C. section 360bbb-3(b)(1), unless the authorization is terminated or revoked.  Performed at [facility], [address] [place], [place] [address]          Radiology Studies: CT Abdomen Pelvis W Contrast  Result Date: 02/19/2021 CLINICAL DATA:  Vomiting, abdominal pain after starting new cancer medication. Breast cancer with hepatic metastases. EXAM: CT ABDOMEN AND PELVIS WITH CONTRAST TECHNIQUE: Multidetector CT imaging of the abdomen and pelvis was performed using the standard protocol following bolus administration of intravenous contrast. CONTRAST:  49mL OMNIPAQUE IOHEXOL 350 MG/ML SOLN COMPARISON:  MRI abdomen dated 02/09/2021. CT abdomen/pelvis dated 07/15/2020.  FINDINGS: Lower chest: Lung bases are clear. Hepatobiliary: Innumerable/diffuse hepatic metastases, better evaluated on recent MR. Gallbladder is underdistended but grossly unremarkable. No intrahepatic or extrahepatic ductal dilatation. Pancreas: Within normal limits. Spleen: Within normal limits. Adrenals/Urinary Tract: Adrenal glands are within normal limits. Kidneys are within normal limits.  No hydronephrosis. Bladder is within normal limits. Stomach/Bowel: Stomach is within normal limits. No evidence of bowel obstruction. Normal appendix (series 2/image 69). No colonic wall thickening or inflammatory changes. Vascular/Lymphatic: No evidence of abdominal aortic aneurysm. No suspicious abdominopelvic lymphadenopathy. Reproductive: Uterus is within normal limits. Bilateral ovaries are within normal limits. Other: Small volume pelvic ascites. No frank peritoneal nodularity/omental caking. Musculoskeletal: Multifocal lytic and sclerotic osseous metastases involving the visualized axial and appendicular skeleton, unchanged from recent MRI. IMPRESSION: No evidence of bowel obstruction.  Normal appendix. Innumerable hepatic metastases, unchanged from recent MRI. Multifocal lytic and sclerotic osseous metastases, unchanged from recent MRI. Small volume pelvic ascites. No frank peritoneal nodularity/omental caking. Electronically Signed   By: Julian Hy M.D.   On: 02/19/2021 23:23        Scheduled Meds:  oxyCODONE  20 mg Oral Q12H   polyethylene glycol  17 g Oral Daily   senna  1 tablet Oral BID   Continuous Infusions:     LOS: 1 day    Time spent: 41 minutes spent on chart review, discussion with nursing staff, consultants, updating family and interview/physical exam; more than 50% of that time was spent in counseling and/or coordination of care.    Ladean Steinmeyer J [place] DO [facility] Available via Epic secure chat 7am-7pm After these hours, please refer to coverage provider listed on  [email or web address] 02/21/2021, 12:43 PM

## 2021-02-21 NOTE — Progress Notes (Signed)
HEMATOLOGY-ONCOLOGY PROGRESS NOTE  SUBJECTIVE: Melissa [name] was admitted secondary to abdominal pain.  Reports improvement in her abdominal pain this morning and wants to try a soft diet.  She denies headaches, dizziness, chest pain, shortness of breath.  Denies nausea and vomiting.  Oncology History  Malignant neoplasm of overlapping sites of left breast in female, estrogen receptor positive ([place])  02/26/2019 Initial Diagnosis   Pregnant lady with 24-month history of left breast swelling and skin thickening, mammogram showed pleomorphic calcifications 8 cm, axilla lymph node biopsy positive ER 60%, PR 60%, Ki-67 50%, HER-2 negative ratio 1.25, anterior and posterior end of the calcifications biopsied: 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 ER 50%, PR 30%, Ki-67 50%, HER-2 negative ratio 1.03   03/04/2019 Cancer Staging   Staging form: Breast, AJCC 8th Edition - Clinical stage from 03/04/2019: Stage IIIB (cT4, cN1, cM0, G3, ER+, PR+, HER2-) - Signed by Nicholas Lose, MD on 03/04/2019     Neo-Adjuvant Chemotherapy   Adriamycin and Cytoxan x 4 given every three weeks without Neulasta support., followed by weekly Taxol x 12 versus Taxotere Cytoxan after her delivery   04/15/2019 Genetic Testing   Negative genetic testing:  No pathogenic variants detected on the [facility] Breast Cancer Guidelines-Based Panel or the Common Hereditary Cancers Panel. The report date is 04/15/2019.  The Breast Cancer Guidelines-Based panel offered by [facility] includes sequencing and rearrangement analysis for the following 11 genes:  ATM, BRCA1, BRCA2, CDH1, CHEK2, NBN, NF1, PALB2, PTEN, STK11 and TP53.  The Common Hereditary Cancers Panel offered by [facility] includes sequencing and/or deletion duplication testing of the following 48 genes: APC, ATM, AXIN2, BARD1, BMPR1A, BRCA1, BRCA2, BRIP1, CDH1, CDK4, CDKN2A (p14ARF), CDKN2A (p16INK4a), CHEK2, CTNNA1, DICER1, EPCAM (Deletion/duplication testing only), GREM1 (promoter  region deletion/duplication testing only), KIT, MEN1, MLH1, MSH2, MSH3, MSH6, MUTYH, NBN, NF1, NHTL1, PALB2, PDGFRA, PMS2, POLD1, POLE, PTEN, RAD50, RAD51C, RAD51D, RNF43, SDHB, SDHC, SDHD, SMAD4, SMARCA4. STK11, TP53, TSC1, TSC2, and VHL.  The following genes were evaluated for sequence changes only: SDHA and HOXB13 c.251G>A variant only.    10/08/2019 Surgery   Bilateral mastectomies [name]):  Right mastectomy: Multifocal grade 2 IDC 0.8 cm and 0.6 cm with high-grade DCIS, margins negative, 0/4 lymph nodes ER 95%, PR 5%, Ki-67 10%, HER-2 negative;  Left mastectomy: IDC 0.6 cm, LVI, margins negative, 1/4 lymph nodes positive, ER 60%, PR 60%, HER-2 negative, Ki-67 50% RCB class II   11/30/2019 - 01/08/2020 Radiation Therapy   Adjuvant radiation    Anti-estrogen oral therapy   Ibrance Letrozole, Meribeth Mattes   02/22/2021 -  Chemotherapy   Patient is on Treatment Plan : BREAST METASTATIC fam-trastuzumab deruxtecan-nxki (Enhertu) q21d     Metastatic breast cancer ([place])  02/13/2021 Initial Diagnosis   Metastatic breast cancer ([place])   02/22/2021 -  Chemotherapy   Patient is on Treatment Plan : BREAST METASTATIC fam-trastuzumab deruxtecan-nxki (Enhertu) q21d        REVIEW OF SYSTEMS:   Constitutional: Denies fevers, chills  Eyes: Denies blurriness of vision Ears, nose, mouth, throat, and face: Denies mucositis or sore throat Respiratory: Denies cough, dyspnea or wheezes Cardiovascular: Denies palpitation, chest discomfort Gastrointestinal: Reports improvement in her abdominal pain, no nausea or vomiting Skin: Denies abnormal skin rashes Lymphatics: Denies new lymphadenopathy or easy bruising Neurological:Denies numbness, tingling or new weaknesses Behavioral/Psych: Mood is stable, no new changes  Extremities: No lower extremity edema All other systems were reviewed with the patient and are negative.  I have reviewed the past  medical history, past surgical history, social history  and family history with the patient and they are unchanged from previous note.   PHYSICAL EXAMINATION: ECOG PERFORMANCE STATUS: 1 - Symptomatic but completely ambulatory  Vitals:   02/21/21 0429 02/21/21 0812  BP: 108/74 124/72  Pulse: (!) 136 (!) 125  Resp: 18 18  Temp: (!) 100.8 F (38.2 C) 98.2 F (36.8 C)  SpO2: 96% 100%   Filed Weights   02/20/21 1837  Weight: 81.2 kg    Intake/Output from previous day: No intake/output data recorded.  GENERAL:alert, no distress and comfortable SKIN: skin color, texture, turgor are normal, no rashes or significant lesions EYES: Scleral icterus present OROPHARYNX:no exudate, no erythema and lips, buccal mucosa, and tongue normal  LUNGS: clear to auscultation and percussion with normal breathing effort HEART: regular rate & rhythm and no murmurs and no lower extremity edema ABDOMEN:abdomen soft, non-tender and normal bowel sounds NEURO: alert & oriented x 3 with fluent speech, no focal motor/sensory deficits  LABORATORY DATA:  I have reviewed the data as listed CMP Latest Ref Rng & Units 02/21/2021 02/20/2021 02/19/2021  Glucose 70 - 99 mg/dL 113(H) 113(H) 144(H)  BUN 6 - 20 mg/dL $Remove'9 11 13  'NGFErxJ$ Creatinine 0.44 - 1.00 mg/dL 0.59 0.69 0.71  Sodium 135 - 145 mmol/L 134(L) 136 135  Potassium 3.5 - 5.1 mmol/L 3.5 3.5 3.7  Chloride 98 - 111 mmol/L 99 101 97(L)  CO2 22 - 32 mmol/L $RemoveB'23 25 25  'SFHuBrkb$ Calcium 8.9 - 10.3 mg/dL 7.2(L) 7.3(L) 8.1(L)  Total Protein 6.5 - 8.1 g/dL 6.1(L) 6.2(L) 7.5  Total Bilirubin 0.3 - 1.2 mg/dL 13.6(H) 13.1(H) 14.7(H)  Alkaline Phos 38 - 126 U/L 229(H) 226(H) 246(H)  AST 15 - 41 U/L 982(H) 549(H) 592(H)  ALT 0 - 44 U/L 143(H) 122(H) 144(H)    Lab Results  Component Value Date   WBC 3.3 (L) 02/21/2021   HGB 9.1 (L) 02/21/2021   HCT 28.3 (L) 02/21/2021   MCV 104.8 (H) 02/21/2021   PLT 24 (LL) 02/21/2021   NEUTROABS 1.5 (L) 02/19/2021    CT Abdomen Pelvis W Contrast  Result Date: 02/19/2021 CLINICAL DATA:   Vomiting, abdominal pain after starting new cancer medication. Breast cancer with hepatic metastases. EXAM: CT ABDOMEN AND PELVIS WITH CONTRAST TECHNIQUE: Multidetector CT imaging of the abdomen and pelvis was performed using the standard protocol following bolus administration of intravenous contrast. CONTRAST:  56mL OMNIPAQUE IOHEXOL 350 MG/ML SOLN COMPARISON:  MRI abdomen dated 02/09/2021. CT abdomen/pelvis dated 07/15/2020. FINDINGS: Lower chest: Lung bases are clear. Hepatobiliary: Innumerable/diffuse hepatic metastases, better evaluated on recent MR. Gallbladder is underdistended but grossly unremarkable. No intrahepatic or extrahepatic ductal dilatation. Pancreas: Within normal limits. Spleen: Within normal limits. Adrenals/Urinary Tract: Adrenal glands are within normal limits. Kidneys are within normal limits.  No hydronephrosis. Bladder is within normal limits. Stomach/Bowel: Stomach is within normal limits. No evidence of bowel obstruction. Normal appendix (series 2/image 69). No colonic wall thickening or inflammatory changes. Vascular/Lymphatic: No evidence of abdominal aortic aneurysm. No suspicious abdominopelvic lymphadenopathy. Reproductive: Uterus is within normal limits. Bilateral ovaries are within normal limits. Other: Small volume pelvic ascites. No frank peritoneal nodularity/omental caking. Musculoskeletal: Multifocal lytic and sclerotic osseous metastases involving the visualized axial and appendicular skeleton, unchanged from recent MRI. IMPRESSION: No evidence of bowel obstruction.  Normal appendix. Innumerable hepatic metastases, unchanged from recent MRI. Multifocal lytic and sclerotic osseous metastases, unchanged from recent MRI. Small volume pelvic ascites. No frank peritoneal nodularity/omental caking. Electronically Signed  By: Julian Hy M.D.   On: 02/19/2021 23:23   MR 3D Recon At Scanner  Result Date: 02/09/2021 CLINICAL DATA:  History of breast cancer.  Jaundice.  EXAM: MRI ABDOMEN WITHOUT AND WITH CONTRAST (INCLUDING MRCP) TECHNIQUE: Multiplanar multisequence MR imaging of the abdomen was performed both before and after the administration of intravenous contrast. Heavily T2-weighted images of the biliary and pancreatic ducts were obtained, and three-dimensional MRCP images were rendered by post processing. CONTRAST:  8.7mL GADAVIST GADOBUTROL 1 MMOL/ML IV SOLN COMPARISON:  [facility] CT of 02/07/2021. FINDINGS: Lower chest: Normal heart size without pericardial or pleural effusion. Hepatobiliary: Diffuse hepatic metastasis, with lesions replacing nearly the entire hepatic parenchyma on the order of 1.3 cm. Normal gallbladder. No intra or extrahepatic biliary duct dilatation. No obstructive stone or mass. Pancreas:  Normal, without mass or ductal dilatation. Spleen:  Normal in size, without focal abnormality. Adrenals/Urinary Tract: Normal adrenal glands. Too small to characterize lesions in both kidneys. No hydronephrosis. Stomach/Bowel: Normal stomach and abdominal bowel loops. Vascular/Lymphatic: Normal caliber of the aorta and branch vessels. No retroperitoneal or retrocrural adenopathy. Other:  No ascites.  No evidence of omental or peritoneal disease. Musculoskeletal: Diffuse osseous metastasis, as have been detailed previously. IMPRESSION: 1. Since 07/15/2020, development of diffuse hepatic metastasis, nearly completely replacing the hepatic parenchyma. 2. No biliary duct dilatation or alternate explanation for jaundice. 3. Diffuse osseous metastasis, as detailed previously. Electronically Signed   By: Abigail Miyamoto M.D.   On: 02/09/2021 21:10   US BIOPSY (LIVER)  Result Date: 02/10/2021 INDICATION: 30 year old woman with history of breast cancer presents to IR for biopsy of new diffuse liver lesions. EXAM: Ultrasound-guided biopsy of left liver mass MEDICATIONS: None. ANESTHESIA/SEDATION: Moderate (conscious) sedation was employed during this procedure. A  total of Versed 2 mg and Fentanyl 50 mcg was administered intravenously. Moderate Sedation Time: 10 minutes. The patient's level of consciousness and vital signs were monitored continuously by radiology nursing throughout the procedure under my direct supervision. COMPLICATIONS: None immediate. PROCEDURE: Informed written consent was obtained from the patient after a thorough discussion of the procedural risks, benefits and alternatives. All questions were addressed. Maximal Sterile Barrier Technique was utilized including caps, mask, sterile gowns, sterile gloves, sterile drape, hand hygiene and skin antiseptic. A timeout was performed prior to the initiation of the procedure. Patient position supine on the ultrasound table. Epigastric skin prepped and draped in usual sterile fashion. Following local lidocaine administration, 17 gauge introducer needle was advanced into the left hepatic lobe, and three 18 gauge cores were obtained utilizing continuous ultrasound guidance. Gelfoam slurry was administered through the introducer needle at the biopsy site. Samples were sent to pathology in formalin. Needle removed and hemostasis achieved with 5 minutes of manual compression. Post procedure ultrasound images showed no evidence of significant hemorrhage. IMPRESSION: Ultrasound-guided biopsy of left liver lesions. Electronically Signed   By: Miachel Roux M.D.   On: 02/10/2021 11:31   MR ABDOMEN MRCP W WO CONTAST  Result Date: 02/09/2021 CLINICAL DATA:  History of breast cancer.  Jaundice. EXAM: MRI ABDOMEN WITHOUT AND WITH CONTRAST (INCLUDING MRCP) TECHNIQUE: Multiplanar multisequence MR imaging of the abdomen was performed both before and after the administration of intravenous contrast. Heavily T2-weighted images of the biliary and pancreatic ducts were obtained, and three-dimensional MRCP images were rendered by post processing. CONTRAST:  8.23mL GADAVIST GADOBUTROL 1 MMOL/ML IV SOLN COMPARISON:  [facility] CT  of 02/07/2021. FINDINGS: Lower chest: Normal heart size without pericardial or pleural effusion.  Hepatobiliary: Diffuse hepatic metastasis, with lesions replacing nearly the entire hepatic parenchyma on the order of 1.3 cm. Normal gallbladder. No intra or extrahepatic biliary duct dilatation. No obstructive stone or mass. Pancreas:  Normal, without mass or ductal dilatation. Spleen:  Normal in size, without focal abnormality. Adrenals/Urinary Tract: Normal adrenal glands. Too small to characterize lesions in both kidneys. No hydronephrosis. Stomach/Bowel: Normal stomach and abdominal bowel loops. Vascular/Lymphatic: Normal caliber of the aorta and branch vessels. No retroperitoneal or retrocrural adenopathy. Other:  No ascites.  No evidence of omental or peritoneal disease. Musculoskeletal: Diffuse osseous metastasis, as have been detailed previously. IMPRESSION: 1. Since 07/15/2020, development of diffuse hepatic metastasis, nearly completely replacing the hepatic parenchyma. 2. No biliary duct dilatation or alternate explanation for jaundice. 3. Diffuse osseous metastasis, as detailed previously. Electronically Signed   By: Abigail Miyamoto M.D.   On: 02/09/2021 21:10    ASSESSMENT AND PLAN: 1.  Metastatic breast cancer, ER/PR positive, HER2 negative ratio was 1.25 2.  Pancytopenia 3.  Severe hyperbilirubinemia and transaminitis  -The patient was scheduled to begin systemic treatment with Enhertu as an outpatient in our office yesterday.  Unfortunately, she is now admitted to the hospital with abdominal pain and worsening liver function. -We discussed proceeding with palliative chemotherapy today as an inpatient.  Adverse effects of this treatment have been discussed with the patient and she agrees to proceed. -We will need to monitor her CBC and CMET closely. -Recommend PRBC transfusion for hemoglobin less than 8 and platelet transfusion for platelet count less than 10,000 or active bleeding.  Future  Appointments  Date Time Provider [place]  02/22/2021 12:15 PM Nicholas Lose, MD [facility] None  02/27/2021 11:30 AM Pickenpack-Cousar, Carlena Sax, NP [facility] None      LOS: 1 day   Mikey Bussing, DNP, AGPCNP-BC, AOCNP 02/21/21

## 2021-02-21 NOTE — Consult Note (Addendum)
Consultation Note Date: 02/21/2021   Patient Name: Melissa [name]  DOB: [birth year]  MRN: [id number]  Age / Sex: 30 y.o., female  PCP: [name], NP Referring Physician: [place] Eric J, DO  Reason for Consultation: Establishing goals of care  HPI/Patient Profile: 30 y.o. female  with past medical history of anemia, CHF, malignant left-sided breast cancer with bone and liver mets admitted on 02/19/2021 with abd pain, nausea, vomiting, severe jaundice.   Clinical Assessment and Goals of Care: I met today with Melissa [name] and her sister at bedside. Melissa [name] is very sleepy with flat affect. She refers to her sister at bedside for most of my questions (sister is a Marine scientist). We focused our conversation today on symptom management and overall support to optimize her quality of life. Melissa [name] says that the pain medicine helps her. She denies any nausea but has not really had anything to eat yet. Her sister says that some of the prn medication at home confuses her and she does not take as much as she needs. We discussed beginning some scheduled long acting medication that could be easier to manage and make her pain overall more manageable and hopefully less need for prn medication. She reports her pain in right flank area and worse with movement and pain medication helps.   Her sister reports that they focus on taking everything one day at a time and try to remain positive. We discussed bringing photos and things from home to make her more comfortable here in the hospital and to lift her spirits. Melissa [name] reports that most important to her is her two daughters 27 yo and 23 yo. She becomes tearful speaking of her children. They are being cared for by her family (mother and 7 siblings pitching in). She reports that family is the most important thing in her life. She is anxious to start her chemo as soon as possible. They understand that we  are concerned for her liver function and overall status and that she is very ill. In our conversation it was evident that it would be very important for her to visit with her children.   I spoke with [facility] regarding Jayleah's overall condition and concerning prognosis. Discussed my recommendation to allow visitation from 32 yo and 44 yo as this would be beneficial to patient and children. Given the extenuating circumstances of her condition we agreed that a one time visit would be appropriate. I shared this decision with Augustina and family at bedside (they are still working out timing of visitation). They verbalize understanding that this is a one time allowance.   All questions/concerns addressed. Emotional support provided. Updated Dr. [place]   Primary Decision Maker PATIENT    SUMMARY OF RECOMMENDATIONS   - Ongoing goals of care conversations - Hope to pursue chemotherapy and further treatment and ultimately return home with her children and family - Symptom management outlined below  Code Status/Advance Care Planning: Full code - will need further discussion.    Symptom Management:  Right sided pain, tender to  touch: Lidoderm patch.  OxyCONTIN 20 mg BID (total 60 mg equivalent over past 24 hours - smaller dose provided to account for cross tolerance of dilaudid to oxycodone).  OxyIR 5 mg every 4 hours prn mod pain.  Dilaudid IV 1 mg every 2 hours prn severe pain.  Constipation: LBM 02/17/21. Continue Miralax daily. Senokot 1 tablet BID. Lactulose 30 mg prn daily.  Nausea/Vomiting: PRN Zofran and PRN compazine.  Depression: May consider Zyprexa 2.5-5 mg po qhs if QTc allows. This could be beneficial for depression, anxiety, insomnia, intake, and nausea.   Palliative Prophylaxis:  Bowel Regimen, Delirium Protocol, Frequent Pain Assessment, and Oral Care  Additional Recommendations (Limitations, Scope, Preferences): Full Scope Treatment  Psycho-social/Spiritual:  Desire for  further Chaplaincy support:no Additional Recommendations: Grief/Bereavement Support  Prognosis:  Overall prognosis is poor with advanced cancer and liver failure.   Discharge Planning: To Be Determined      Primary Diagnoses: Present on Admission:  RUQ abdominal pain  Hepatic metastasis (HCC)  Elevated liver enzymes  Intractable nausea and vomiting   I have reviewed the medical record, interviewed the patient and family, and examined the patient. The following aspects are pertinent.  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 age 16   left breast ca dx'd 02/26/19   breast cancer   Social History   Socioeconomic History   Marital status: Single    Spouse name: Not on file   Number of children: Not on file   Years of education: Not on file   Highest education level: Not on file  Occupational History   Not on file  Tobacco Use   Smoking status: Former    Types: Cigarettes    Quit date: 02/27/2019    Years since quitting: 1.9   Smokeless tobacco: Never  Vaping Use   Vaping Use: Never used  Substance and Sexual Activity   Alcohol use: Not Currently   Drug use: Yes    Frequency: 2.0 times per week    Types: Marijuana    Comment: x1 weekly   Sexual activity: Yes    Birth control/protection: None  Other Topics Concern   Not on file  Social History Narrative   Not on file   Social Determinants of Health   Financial Resource Strain: Not on file  Food Insecurity: Not on file  Transportation Needs: Not on file  Physical Activity: Not on file  Stress: Not on file  Social Connections: Not on file   Family History  Problem Relation  Age of Onset   Breast cancer Paternal Grandmother        dx. 54s or younger   Stroke Father 24   Hypertension Father    Cancer Maternal Aunt        unknown type, dx. late 25s   Colon cancer Paternal Uncle        dx 53s   Throat cancer Maternal Grandmother        dx. 32s   Cancer Paternal Uncle        unknown type, dx. 29s   Breast  cancer Cousin        dx. 60s, paternal 1st cousin   Scheduled Meds:  polyethylene glycol  17 g Oral Daily   Continuous Infusions: PRN Meds:.HYDROmorphone (DILAUDID) injection, ketorolac, LORazepam, ondansetron (ZOFRAN) IV, oxyCODONE, senna-docusate No Known Allergies Review of Systems  Constitutional:  Positive for appetite change and fatigue.  Gastrointestinal:  Positive for nausea.  Neurological:  Positive for weakness.   Physical Exam Vitals and nursing note reviewed.  Constitutional:      General: She is not in acute distress.    Appearance: She is ill-appearing.  Cardiovascular:     Rate and Rhythm: Tachycardia present.  Pulmonary:     Effort: No tachypnea, accessory muscle usage or respiratory distress.  Abdominal:     Tenderness: There is abdominal tenderness in the right upper quadrant and right lower quadrant.  Neurological:     Mental Status: She is alert and oriented to person, place, and time.  Psychiatric:        Mood and Affect: Affect is flat.    Vital Signs: BP 124/72 (BP Location: Left Arm)    Pulse (!) 125    Temp 98.2 F (36.8 C) (Oral)    Resp 18    Ht $R'5\' 2"'xx$  (1.575 m)    Wt 81.2 kg    SpO2 100%    BMI 32.74 kg/m  Pain Scale: 0-10   Pain Score: 2    SpO2: SpO2: 100 % O2 Device:SpO2: 100 % O2 Flow Rate: .   IO: Intake/output summary: No intake or output data in the 24 hours ending 02/21/21 0928  LBM: Last BM Date: 02/17/21 Baseline Weight: Weight: 81.2 kg Most recent weight: Weight: 81.2 kg     Palliative Assessment/Data:     Time In: 1030 Time Out: 1120 Time Total: 50 min Greater than 50%  of this time  was spent counseling and coordinating care related to the above assessment and plan.  Signed by: Vinie Sill, NP Palliative Medicine Team Pager # [phone] (M-F 8a-5p) Team Phone # [phone] (Nights/Weekends)

## 2021-02-21 NOTE — Progress Notes (Signed)
Right chest port-a-cath was accessed with no blood return.  Chemotherapy (Enhertu) was double checked via face time with a second chemotherapy certified nurse, Laurence Compton RN. Chemotherapy was verified for right dose, concentration, exp date, proper tubing.

## 2021-02-22 ENCOUNTER — Encounter: Payer: Self-pay | Admitting: Hematology and Oncology

## 2021-02-22 ENCOUNTER — Inpatient Hospital Stay: Payer: Medicaid Other | Admitting: Hematology and Oncology

## 2021-02-22 DIAGNOSIS — C787 Secondary malignant neoplasm of liver and intrahepatic bile duct: Secondary | ICD-10-CM

## 2021-02-22 LAB — URINALYSIS, ROUTINE W REFLEX MICROSCOPIC
Glucose, UA: 100 mg/dL — AB
Ketones, ur: NEGATIVE mg/dL
Leukocytes,Ua: NEGATIVE
Nitrite: NEGATIVE
Protein, ur: 100 mg/dL — AB
Specific Gravity, Urine: >=1.03 (ref 1.005–1.030)
pH: 6 (ref 5.0–8.0)

## 2021-02-22 LAB — COMPREHENSIVE METABOLIC PANEL
ALT: 190 U/L — ABNORMAL HIGH (ref 0–44)
AST: 1254 U/L — ABNORMAL HIGH (ref 15–41)
Albumin: 2.6 g/dL — ABNORMAL LOW (ref 3.5–5.0)
Alkaline Phosphatase: 257 U/L — ABNORMAL HIGH (ref 38–126)
Anion gap: 13 (ref 5–15)
BUN: 12 mg/dL (ref 6–20)
CO2: 25 mmol/L (ref 22–32)
Calcium: 7.4 mg/dL — ABNORMAL LOW (ref 8.9–10.3)
Chloride: 98 mmol/L (ref 98–111)
Creatinine, Ser: 0.52 mg/dL (ref 0.44–1.00)
GFR, Estimated: >60 mL/min (ref >60)
Glucose, Bld: 181 mg/dL — ABNORMAL HIGH (ref 70–99)
Potassium: 4 mmol/L (ref 3.5–5.1)
Sodium: 136 mmol/L (ref 135–145)
Total Bilirubin: 14.6 mg/dL — ABNORMAL HIGH (ref 0.3–1.2)
Total Protein: 6.3 g/dL — ABNORMAL LOW (ref 6.5–8.1)

## 2021-02-22 LAB — CBC
HCT: 28.6 % — ABNORMAL LOW (ref 36.0–46.0)
Hemoglobin: 9.3 g/dL — ABNORMAL LOW (ref 12.0–15.0)
MCH: 33.8 pg (ref 26.0–34.0)
MCHC: 32.5 g/dL (ref 30.0–36.0)
MCV: 104 fL — ABNORMAL HIGH (ref 80.0–100.0)
Platelets: 21 10*3/uL — CL (ref 150–400)
RBC: 2.75 MIL/uL — ABNORMAL LOW (ref 3.87–5.11)
RDW: 21 % — ABNORMAL HIGH (ref 11.5–15.5)
WBC: 2.8 10*3/uL — ABNORMAL LOW (ref 4.0–10.5)
nRBC: 71.9 % — ABNORMAL HIGH (ref 0.0–0.2)

## 2021-02-22 LAB — AMMONIA: Ammonia: 107 umol/L — ABNORMAL HIGH (ref 9–35)

## 2021-02-22 MED ORDER — LACTULOSE 10 GM/15ML PO SOLN
30.0000 g | Freq: Two times a day (BID) | ORAL | Status: DC
  Administered 2021-02-22 – 2021-02-23 (×4): 30 g via ORAL
  Filled 2021-02-22 (×4): qty 45

## 2021-02-22 MED ORDER — CHLORHEXIDINE GLUCONATE CLOTH 2 % EX PADS
6.0000 | MEDICATED_PAD | Freq: Every day | CUTANEOUS | Status: DC
  Administered 2021-02-22 – 2021-02-23 (×2): 6 via TOPICAL

## 2021-02-22 MED ORDER — SODIUM CHLORIDE 0.9 % IV SOLN
6.2500 mg | Freq: Four times a day (QID) | INTRAVENOUS | Status: DC | PRN
  Administered 2021-02-22: 22:00:00 6.25 mg via INTRAVENOUS
  Filled 2021-02-22 (×2): qty 0.25

## 2021-02-22 MED ORDER — HYDROMORPHONE HCL 1 MG/ML IJ SOLN
0.5000 mg | INTRAMUSCULAR | Status: DC | PRN
Start: 2021-02-22 — End: 2021-02-22
  Administered 2021-02-22: 17:00:00 0.5 mg via INTRAVENOUS
  Filled 2021-02-22: qty 1

## 2021-02-22 MED ORDER — BOOST / RESOURCE BREEZE PO LIQD CUSTOM
1.0000 | Freq: Three times a day (TID) | ORAL | Status: DC
  Administered 2021-02-22 – 2021-02-23 (×2): 1 via ORAL

## 2021-02-22 MED ORDER — HYDROMORPHONE HCL 1 MG/ML IJ SOLN: 0.5000 mg | INTRAMUSCULAR | Status: DC | PRN

## 2021-02-22 NOTE — Progress Notes (Signed)
Pt reports left arm swollen since IV was removed on day shift on 02/21/2021, site is swollen. Site assessed, cleaned, and ice pack applied. Instructed patient to place ice pack for 5-10 minutes and then remove for 5-10 minutes alternating. Denies pain at site, no redness, no drainage or signs of infection. Will continue to monitor. Kjones RN

## 2021-02-22 NOTE — Plan of Care (Signed)
Brother at bedside throughout the night. Pt sleepy most of night until 0345. Reported feeling "woozy" at 0415, phenergan IV administered. Requested ginger ale as well. Denied any additional concerns at this time. No distress noted. Will continue to monitor pt. [place]. Kjones, rN Problem: Clinical Measurements: Goal: Will remain free from infection Outcome: Progressing Goal: Respiratory complications will improve Outcome: Progressing Goal: Cardiovascular complication will be avoided Outcome: Progressing

## 2021-02-22 NOTE — Plan of Care (Signed)
°  Problem: Education: Goal: Knowledge of General Education information will improve Description: Including pain rating scale, medication(s)/side effects and non-pharmacologic comfort measures 02/22/2021 2111 by Effie Berkshire, RN Outcome: Progressing 02/22/2021 2110 by Effie Berkshire, RN Outcome: Progressing   Problem: Health Behavior/Discharge Planning: Goal: Ability to manage health-related needs will improve 02/22/2021 2111 by Effie Berkshire, RN Outcome: Progressing 02/22/2021 2110 by Effie Berkshire, RN Outcome: Progressing   Problem: Clinical Measurements: Goal: Ability to maintain clinical measurements within normal limits will improve 02/22/2021 2111 by Effie Berkshire, RN Outcome: Progressing 02/22/2021 2110 by Effie Berkshire, RN Outcome: Progressing Goal: Will remain free from infection 02/22/2021 2111 by Effie Berkshire, RN Outcome: Progressing 02/22/2021 2110 by Effie Berkshire, RN Outcome: Progressing Goal: Diagnostic test results will improve 02/22/2021 2111 by Effie Berkshire, RN Outcome: Progressing 02/22/2021 2110 by Effie Berkshire, RN Outcome: Progressing Goal: Respiratory complications will improve 02/22/2021 2111 by Effie Berkshire, RN Outcome: Progressing 02/22/2021 2110 by Effie Berkshire, RN Outcome: Progressing Goal: Cardiovascular complication will be avoided 02/22/2021 2111 by Effie Berkshire, RN Outcome: Progressing 02/22/2021 2110 by Effie Berkshire, RN Outcome: Progressing   Problem: Activity: Goal: Risk for activity intolerance will decrease 02/22/2021 2111 by Effie Berkshire, RN Outcome: Progressing 02/22/2021 2110 by Effie Berkshire, RN Outcome: Progressing   Problem: Nutrition: Goal: Adequate nutrition will be maintained 02/22/2021 2111 by Effie Berkshire, RN Outcome: Progressing 02/22/2021 2110 by Effie Berkshire, RN Outcome: Progressing   Problem: Coping: Goal: Level  of anxiety will decrease 02/22/2021 2111 by Effie Berkshire, RN Outcome: Progressing 02/22/2021 2110 by Effie Berkshire, RN Outcome: Progressing   Problem: Elimination: Goal: Will not experience complications related to bowel motility 02/22/2021 2111 by Effie Berkshire, RN Outcome: Progressing 02/22/2021 2110 by Effie Berkshire, RN Outcome: Progressing Goal: Will not experience complications related to urinary retention 02/22/2021 2111 by Effie Berkshire, RN Outcome: Progressing 02/22/2021 2110 by Effie Berkshire, RN Outcome: Progressing   Problem: Pain Managment: Goal: General experience of comfort will improve 02/22/2021 2111 by Effie Berkshire, RN Outcome: Progressing 02/22/2021 2110 by Effie Berkshire, RN Outcome: Progressing   Problem: Safety: Goal: Ability to remain free from injury will improve 02/22/2021 2111 by Effie Berkshire, RN Outcome: Progressing 02/22/2021 2110 by Effie Berkshire, RN Outcome: Progressing   Problem: Skin Integrity: Goal: Risk for impaired skin integrity will decrease 02/22/2021 2111 by Effie Berkshire, RN Outcome: Progressing 02/22/2021 2110 by Effie Berkshire, RN Outcome: Progressing    Problem: Education: Goal: Knowledge of the prescribed therapeutic regimen will improve 02/22/2021 2111 by Effie Berkshire, RN Outcome: Progressing 02/22/2021 2110 by Effie Berkshire, RN Outcome: Progressing Problem: Activity: Goal: Ability to implement measures to reduce episodes of fatigue will improve 02/22/2021 2111 by Effie Berkshire, RN Outcome: Progressing 02/22/2021 2110 by Effie Berkshire, RN Outcome: Progressing

## 2021-02-22 NOTE — Progress Notes (Signed)
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  Patient states was feeling well for a few days after recent hospital admission, but over the last 3 days has been having more significant right upper quadrant and right flank abdominal pain.     Recently admitted 02/09/2021 - 02/11/2021 for abdominal pain and severe jaundice.  Liver MRI showed new diffuse hepatic metastases nearly completely replacing the hepatic parenchyma.  There is no evidence of obstruction.  Liver biopsy performed on 02/10/2021 shows metastatic carcinoma.  Patient has plan to start systemic chemotherapy with Enhertu on 02/20/2021 per her oncologist Dr. Lindi Adie.   CT abdomen/pelvis with no evidence of bowel obstruction, normal appendix, and hepatic metastases unchanged from recent MRI, multifocal lytic and sclerotic osseous metastases unchanged from previous imaging, small volume pelvic ascites without frank peritoneal nodularity/omental caking.  Patient was given 1 L NS, IV Dilaudid 0.5 mg x 2, Zofran.  The hospital service was consulted for further evaluation management of intractable nausea/vomiting and worsening abdominal pain.  Oncology as well as palliative care medicine has been consulted.   New events last 24 hours / Subjective: Patient without any new complaints.  Has no appetite but denies further nausea or vomiting today.  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 Lindi Adie -Status post chemotherapy, bilateral mastectomy, adjuvant radiation, antiestrogen therapy -Started on chemotherapy as inpatient 12/13  -Palliative care also following  Cancer associated pain, intractable nausea and vomiting -Appreciate palliative care following for symptom management -Remains on OxyContin, oxycodone, Dilaudid, toradol, antiemetic prn   Severe hyperbilirubinemia with elevated LFT, lipase Pancytopenia -Likely secondary to underlying malignancy and metastasis -Continue to trend, LFTs continues to worsen  Fever -Fever 100.8 on the morning of 12/13, has been afebrile since  -Check blood cultures, urine culture  Hyperammonia -Lactulose    DVT prophylaxis:  SCDs Start: 02/20/21 0004  Code Status: Full code Family Communication: Brother at bedside Disposition Plan:  Status is: Inpatient  Remains inpatient appropriate because: IV pain management    Antimicrobials:  Anti-infectives (From admission, onward)    None        Objective: Vitals:   02/21/21 2114 02/22/21 0640 02/22/21 0958 02/22/21 1502  BP: 107/67 128/67 (!) 122/91 109/71  Pulse: (!) 106 (!) 118 (!) 136 (!) 129  Resp: 14  18 16   Temp: 98.1 F (36.7 C) 98.5 F (36.9 C) 99.3 F (37.4 C) 98.9 F (37.2 C)  TempSrc: Oral Oral Oral Oral  SpO2: 95% 97% 96% 93%  Weight:      Height:        Intake/Output Summary (Last 24 hours) at 02/22/2021 1535 Last data filed at 02/21/2021 1800 Gross per 24 hour  Intake 137.64 ml  Output --  Net 137.64 ml   Filed Weights   02/20/21 1837  Weight: 81.2 kg    Examination:  General exam: Appears calm  and comfortable  Respiratory system: Clear to auscultation. Respiratory effort normal. No respiratory distress. No conversational dyspnea.  Cardiovascular system: S1 & S2 heard, RRR. No murmurs. No pedal edema. Gastrointestinal system: Abdomen is  nondistended, soft and nontender. Normal bowel sounds heard. Central nervous system: Alert and oriented. No focal neurological deficits. Speech clear.  Extremities: Symmetric in appearance  Skin: No rashes, lesions or ulcers on exposed skin  Psychiatry: Judgement and insight appear normal. Mood & affect appropriate.   Data Reviewed: I have personally reviewed following labs and imaging studies  CBC: Recent Labs  Lab 02/19/21 2011 02/19/21 2350 02/20/21 0552 02/21/21 0544 02/22/21 0550  WBC 3.4*  --  3.1* 3.3* 2.8*  NEUTROABS CANCELLED BY LAB 1.5*  --   --   --   HGB 10.9*  --  9.4* 9.1* 9.3*  HCT 33.2*  --  29.4* 28.3* 28.6*  MCV 101.2*  --  105.0* 104.8* 104.0*  PLT 36*  --  30* 24* 21*   Basic Metabolic Panel: Recent Labs  Lab 02/19/21 2011 02/20/21 0552 02/21/21 0544 02/22/21 0550  NA 135 136 134* 136  K 3.7 3.5 3.5 4.0  CL 97* 101 99 98  CO2 25 25 23 25   GLUCOSE 144* 113* 113* 181*  BUN 13 11 9 12   CREATININE 0.71 0.69 0.59 0.52  CALCIUM 8.1* 7.3* 7.2* 7.4*  MG  --   --  2.8*  --    GFR: Estimated Creatinine Clearance: 99.6 mL/min (by C-G formula based on SCr of 0.52 mg/dL). Liver Function Tests: Recent Labs  Lab 02/19/21 2011 02/20/21 0552 02/21/21 0544 02/22/21 0550  AST 592* 549* 982* 1,254*  ALT 144* 122* 143* 190*  ALKPHOS 246* 226* 229* 257*  BILITOT 14.7* 13.1* 13.6* 14.6*  PROT 7.5 6.2* 6.1* 6.3*  ALBUMIN 3.1* 2.6* 2.6* 2.6*   Recent Labs  Lab 02/19/21 2011 02/21/21 0544  LIPASE 177* 265*   Recent Labs  Lab 02/22/21 0550  AMMONIA 107*   Coagulation Profile: Recent Labs  Lab 02/20/21 0552  INR 1.7*   Cardiac Enzymes: No results for input(s): CKTOTAL, CKMB, CKMBINDEX, TROPONINI in the last 168 hours. BNP (last 3 results) No results for input(s): PROBNP in the last 8760 hours. HbA1C: No results for input(s): HGBA1C in the last 72 hours. CBG: No results for input(s): GLUCAP in the last 168 hours. Lipid Profile: No results for  input(s): CHOL, HDL, LDLCALC, TRIG, CHOLHDL, LDLDIRECT in the last 72 hours. Thyroid Function Tests: No results for input(s): TSH, T4TOTAL, FREET4, T3FREE, THYROIDAB in the last 72 hours. Anemia Panel: No results for input(s): VITAMINB12, FOLATE, FERRITIN, TIBC, IRON, RETICCTPCT in the last 72 hours. Sepsis Labs: Recent Labs  Lab 02/19/21 2023 02/19/21 2234 02/20/21 0552 02/21/21 0544  LATICACIDVEN 4.6* 3.5* 3.4* 3.7*    Recent Results (from the past 240 hour(s))  Resp Panel by RT-PCR (Flu A&B, Covid) Nasopharyngeal Swab     Status: None   Collection Time: 02/19/21  8:00 PM   Specimen: Nasopharyngeal Swab; Nasopharyngeal(NP) swabs in vial transport medium  Result Value Ref Range Status   SARS Coronavirus 2 by RT PCR NEGATIVE NEGATIVE Final    Comment: (NOTE) SARS-CoV-2 target nucleic acids are NOT DETECTED.  The SARS-CoV-2 RNA is generally detectable in upper respiratory specimens during the acute phase of infection. The lowest concentration of SARS-CoV-2 viral copies this assay can detect is 138 copies/mL. A negative result does not preclude SARS-Cov-2 infection and should not be used as the sole basis  for treatment or other patient management decisions. A negative result may occur with  improper specimen collection/handling, submission of specimen other than nasopharyngeal swab, presence of viral mutation(s) within the areas targeted by this assay, and inadequate number of viral copies(<138 copies/mL). A negative result must be combined with clinical observations, patient history, and epidemiological information. The expected result is Negative.  Fact Sheet for Patients:  EntrepreneurPulse.com.au  Fact Sheet for Healthcare Providers:  IncredibleEmployment.be  This test is no t yet approved or cleared by the [place] FDA and  has been authorized for detection and/or diagnosis of SARS-CoV-2 by FDA under an Emergency Use  Authorization (EUA). This EUA will remain  in effect (meaning this test can be used) for the duration of the COVID-19 declaration under Section 564(b)(1) of the Act, 21 U.S.C.section 360bbb-3(b)(1), unless the authorization is terminated  or revoked sooner.       Influenza A by PCR NEGATIVE NEGATIVE Final   Influenza B by PCR NEGATIVE NEGATIVE Final    Comment: (NOTE) The Xpert Xpress SARS-CoV-2/FLU/RSV plus assay is intended as an aid in the diagnosis of influenza from Nasopharyngeal swab specimens and should not be used as a sole basis for treatment. Nasal washings and aspirates are unacceptable for Xpert Xpress SARS-CoV-2/FLU/RSV testing.  Fact Sheet for Patients: EntrepreneurPulse.com.au  Fact Sheet for Healthcare Providers: IncredibleEmployment.be  This test is not yet approved or cleared by the [place] FDA and has been authorized for detection and/or diagnosis of SARS-CoV-2 by FDA under an Emergency Use Authorization (EUA). This EUA will remain in effect (meaning this test can be used) for the duration of the COVID-19 declaration under Section 564(b)(1) of the Act, 21 U.S.C. section 360bbb-3(b)(1), unless the authorization is terminated or revoked.  Performed at [facility], [place] [address] [place], [place] [address]       Radiology Studies: No results found.    Scheduled Meds:  Chlorhexidine Gluconate Cloth  6 each Topical Daily   lactulose  30 g Oral BID   lidocaine  1 patch Transdermal Q24H   oxyCODONE  20 mg Oral Q12H   Continuous Infusions:  promethazine (PHENERGAN) injection (IM or IVPB)       LOS: 2 days      Time spent: 35 minutes   Dessa Phi, DO [facility] 02/22/2021, 3:35 PM   Available via Epic secure chat 7am-7pm After these hours, please refer to coverage provider listed on [email or web address]

## 2021-02-22 NOTE — Progress Notes (Signed)
°  Transition of Care [facility]) Screening Note   Patient Details  Name: Melissa [name] Date of Birth: [birth year]   Transition of [facility]) CM/SW Contact:    Tesa Meadors, Marjie Skiff, RN Phone Number: 02/22/2021, 11:01 AM    Transition of Care Department [facility]) has reviewed patient and no TOC needs have been identified at this time. We will continue to monitor patient advancement through interdisciplinary progression rounds. If new patient transition needs arise, please place a TOC consult.

## 2021-02-22 NOTE — Progress Notes (Signed)
Attempted to draw pt labs per order and per policy. No blood return. IV team consult entered. Ronnald Ramp RN

## 2021-02-22 NOTE — Progress Notes (Signed)
°   02/22/21 0640  Assess: MEWS Score  Temp 98.5 F (36.9 C)  BP 128/67  Pulse Rate (!) 118  Level of Consciousness Alert  SpO2 97 %  O2 Device Room Air  Assess: MEWS Score  MEWS Temp 0  MEWS Systolic 0  MEWS Pulse 2  MEWS RR 0  MEWS LOC 0  MEWS Score 2  MEWS Score Color Yellow  Assess: if the MEWS score is Yellow or Red  Were vital signs taken at a resting state? Yes  Focused Assessment No change from prior assessment  Does the patient meet 2 or more of the SIRS criteria? No  MEWS guidelines implemented *See Row Information* Yes  Treat  MEWS Interventions Administered prn meds/treatments  Pain Scale 0-10  Pain Score 6  Pain Type Acute pain  Pain Location Abdomen  Pain Orientation Right  Pain Descriptors / Indicators Aching  Pain Frequency Intermittent  Pain Onset On-going  Pain Intervention(s) Medication (See eMAR)  Notify: Charge Nurse/RN  Name of Charge Nurse/RN Notified Stephanie, RN  Date Charge Nurse/RN Notified 02/22/21  Time Charge Nurse/RN Notified [phone]  Assess: SIRS CRITERIA  SIRS Temperature  0  SIRS Pulse 1  SIRS Respirations  0  SIRS WBC 0  SIRS Score Sum  1  No distress noted. Per previous documentation, pt with yellow mews on day shift on 02/21/2021. Will continue to monitor pt. Closely. Sunday Spillers

## 2021-02-22 NOTE — Progress Notes (Signed)
Call received from [facility], case worker at [employer], calling to confirm status of pt. States pt's sister requested leave from [place] to see patient due to pt's status. Case number [id number], call back number [phone].

## 2021-02-22 NOTE — Progress Notes (Signed)
Palliative:  HPI:  30 y.o. female  with past medical history of anemia, CHF, malignant left-sided breast cancer with bone and liver mets admitted on 02/19/2021 with abd pain, nausea, vomiting, severe jaundice.    I met at Niquita's bedside but she is sleeping soundly. I spoke more with her brother Delos Haring about her concerning condition with worsening liver function and how we can best support Reniyah and her family. We discussed some things to consider and I encouraged family to open up some discussions about the "what ifs." AJ seems to understand the gravity of the situation and sister Caryl Pina yesterday also understands. Offered family meeting to help explain and answer questions.   I returned later in the day. Calee continues to be very sleepy but she is able to speak and answer questions appropriately. I was considering decreasing her OxyCONTIN but Lovelle reports her pain is better controlled but is starting to hurt again. I asked her if she felt this was making her too sleepy and she tells me no that she feels it is helping. I will continue at current dosage at her request. She continues to deny having BM and I educated about change of medication to help with constipation but also try and lower ammonia levels building up from her liver not working. Malaijah reports no further concerns and no worries.   All questions/concerns addressed. Emotional support provided.   Plan: Right sided pain, tender to touch: Lidoderm patch.  OxyCONTIN 20 mg BID. OxyIR 5 mg every 4 hours prn mod pain.  Dilaudid IV 0.5 mg every 3 hours prn severe pain.  Constipation: LBM 02/17/21. D/C Miralax and senokot. Add lactulose 30 g BID given constipation and ammonia 107.  Nausea/Vomiting: PRN Zofran and PRN compazine.  Depression: May consider Zyprexa 2.5-5 mg po qhs if QTc allows. This could be beneficial for depression, anxiety, insomnia, intake, and nausea. She is too sleepy for this right now.   [place],  NP Palliative Medicine Team Pager [phone] (Please see [email or web address] for schedule) Team Phone [phone]    Greater than 50%  of this time was spent counseling and coordinating care related to the above assessment and plan

## 2021-02-23 DIAGNOSIS — Z66 Do not resuscitate: Secondary | ICD-10-CM

## 2021-02-23 LAB — CBC WITH DIFFERENTIAL/PLATELET
Abs Immature Granulocytes: 0.12 K/uL — ABNORMAL HIGH (ref 0.00–0.07)
Basophils Absolute: 0 K/uL (ref 0.0–0.1)
Basophils Relative: 1 %
Eosinophils Absolute: 0 K/uL (ref 0.0–0.5)
Eosinophils Relative: 0 %
HCT: 27.2 % — ABNORMAL LOW (ref 36.0–46.0)
Hemoglobin: 8.7 g/dL — ABNORMAL LOW (ref 12.0–15.0)
Immature Granulocytes: 5 %
Lymphocytes Relative: 51 %
Lymphs Abs: 1.2 K/uL (ref 0.7–4.0)
MCH: 34.3 pg — ABNORMAL HIGH (ref 26.0–34.0)
MCHC: 32 g/dL (ref 30.0–36.0)
MCV: 107.1 fL — ABNORMAL HIGH (ref 80.0–100.0)
Monocytes Absolute: 0.3 K/uL (ref 0.1–1.0)
Monocytes Relative: 12 %
Neutro Abs: 0.7 K/uL — ABNORMAL LOW (ref 1.7–7.7)
Neutrophils Relative %: 31 %
Platelets: 17 K/uL — CL (ref 150–400)
RBC: 2.54 MIL/uL — ABNORMAL LOW (ref 3.87–5.11)
RDW: 21.9 % — ABNORMAL HIGH (ref 11.5–15.5)
WBC: 2.3 K/uL — ABNORMAL LOW (ref 4.0–10.5)
nRBC: 27.9 % — ABNORMAL HIGH (ref 0.0–0.2)

## 2021-02-23 LAB — COMPREHENSIVE METABOLIC PANEL
ALT: 394 U/L — ABNORMAL HIGH (ref 0–44)
AST: 4296 U/L — ABNORMAL HIGH (ref 15–41)
Albumin: 2.6 g/dL — ABNORMAL LOW (ref 3.5–5.0)
Alkaline Phosphatase: 422 U/L — ABNORMAL HIGH (ref 38–126)
Anion gap: 15 (ref 5–15)
BUN: 23 mg/dL — ABNORMAL HIGH (ref 6–20)
CO2: 21 mmol/L — ABNORMAL LOW (ref 22–32)
Calcium: 6.8 mg/dL — ABNORMAL LOW (ref 8.9–10.3)
Chloride: 102 mmol/L (ref 98–111)
Creatinine, Ser: 0.73 mg/dL (ref 0.44–1.00)
GFR, Estimated: >60 mL/min (ref >60)
Glucose, Bld: 231 mg/dL — ABNORMAL HIGH (ref 70–99)
Potassium: 4.1 mmol/L (ref 3.5–5.1)
Sodium: 138 mmol/L (ref 135–145)
Total Bilirubin: 16.1 mg/dL — ABNORMAL HIGH (ref 0.3–1.2)
Total Protein: 6.3 g/dL — ABNORMAL LOW (ref 6.5–8.1)

## 2021-02-23 LAB — CBC
HCT: 28.3 % — ABNORMAL LOW (ref 36.0–46.0)
Hemoglobin: 9.1 g/dL — ABNORMAL LOW (ref 12.0–15.0)
MCH: 33.7 pg (ref 26.0–34.0)
MCHC: 32.2 g/dL (ref 30.0–36.0)
MCV: 104.8 fL — ABNORMAL HIGH (ref 80.0–100.0)
Platelets: 24 10*3/uL — CL (ref 150–400)
RBC: 2.7 MIL/uL — ABNORMAL LOW (ref 3.87–5.11)
RDW: 21 % — ABNORMAL HIGH (ref 11.5–15.5)
WBC: 3.3 10*3/uL — ABNORMAL LOW (ref 4.0–10.5)
nRBC: 70.9 % — ABNORMAL HIGH (ref 0.0–0.2)

## 2021-02-23 LAB — GLUCOSE, CAPILLARY: Glucose-Capillary: 218 mg/dL — ABNORMAL HIGH (ref 70–99)

## 2021-02-23 MED ORDER — BIOTENE DRY MOUTH MT LIQD: 15.0000 mL | OROMUCOSAL | Status: DC | PRN

## 2021-02-23 MED ORDER — POLYVINYL ALCOHOL 1.4 % OP SOLN
1.0000 [drp] | Freq: Four times a day (QID) | OPHTHALMIC | Status: DC | PRN
  Filled 2021-02-23: qty 15

## 2021-02-23 MED ORDER — LORAZEPAM 2 MG/ML IJ SOLN
0.5000 mg | Freq: Four times a day (QID) | INTRAMUSCULAR | Status: DC | PRN
  Administered 2021-02-23: 0.5 mg via INTRAVENOUS
  Filled 2021-02-23: qty 1

## 2021-02-23 MED ORDER — GLYCOPYRROLATE 0.2 MG/ML IJ SOLN
0.4000 mg | INTRAMUSCULAR | Status: DC | PRN
  Administered 2021-02-24: 0.4 mg via INTRAVENOUS
  Filled 2021-02-23: qty 2

## 2021-02-23 MED ORDER — HYDROMORPHONE HCL 1 MG/ML IJ SOLN
0.5000 mg | INTRAMUSCULAR | Status: DC | PRN
  Administered 2021-02-24: 0.5 mg via INTRAVENOUS
  Filled 2021-02-23: qty 0.5

## 2021-02-23 MED ORDER — HYDROMORPHONE HCL 1 MG/ML IJ SOLN: 0.5000 mg | INTRAMUSCULAR | Status: DC | PRN

## 2021-02-23 NOTE — Progress Notes (Addendum)
HEMATOLOGY-ONCOLOGY PROGRESS NOTE  SUBJECTIVE: More somnolent this morning.  Sits up on the side of the bed intermittently.  Reports pain is controlled.  Family members were at the bedside. Patient was not willing to discuss anything and wanted to rest.  I spoke to the family members.  Oncology History  Malignant neoplasm of overlapping sites of left breast in female, estrogen receptor positive ([place])  02/26/2019 Initial Diagnosis   Pregnant lady with 53-monthhistory of left breast swelling and skin thickening, mammogram showed pleomorphic calcifications 8 cm, axilla lymph node biopsy positive ER 60%, PR 60%, Ki-67 50%, HER-2 negative ratio 1.25, anterior and posterior end of the calcifications biopsied: 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 ER 50%, PR 30%, Ki-67 50%, HER-2 negative ratio 1.03   03/04/2019 Cancer Staging   Staging form: Breast, AJCC 8th Edition - Clinical stage from 03/04/2019: Stage IIIB (cT4, cN1, cM0, G3, ER+, PR+, HER2-) - Signed by GNicholas Lose MD on 03/04/2019     Neo-Adjuvant Chemotherapy   Adriamycin and Cytoxan x 4 given every three weeks without Neulasta support., followed by weekly Taxol x 12 versus Taxotere Cytoxan after her delivery   04/15/2019 Genetic Testing   Negative genetic testing:  No pathogenic variants detected on the [employer] Breast Cancer Guidelines-Based Panel or the Common Hereditary Cancers Panel. The report date is 04/15/2019.  The Breast Cancer Guidelines-Based panel offered by [facility] includes sequencing and rearrangement analysis for the following 11 genes:  ATM, BRCA1, BRCA2, CDH1, CHEK2, NBN, NF1, PALB2, PTEN, STK11 and TP53.  The Common Hereditary Cancers Panel offered by [facility] includes sequencing and/or deletion duplication testing of the following 48 genes: APC, ATM, AXIN2, BARD1, BMPR1A, BRCA1, BRCA2, BRIP1, CDH1, CDK4, CDKN2A (p14ARF), CDKN2A (p16INK4a), CHEK2, CTNNA1, DICER1, EPCAM (Deletion/duplication testing only), GREM1  (promoter region deletion/duplication testing only), KIT, MEN1, MLH1, MSH2, MSH3, MSH6, MUTYH, NBN, NF1, NHTL1, PALB2, PDGFRA, PMS2, POLD1, POLE, PTEN, RAD50, RAD51C, RAD51D, RNF43, SDHB, SDHC, SDHD, SMAD4, SMARCA4. STK11, TP53, TSC1, TSC2, and VHL.  The following genes were evaluated for sequence changes only: SDHA and HOXB13 c.251G>A variant only.    10/08/2019 Surgery   Bilateral mastectomies ([name]:  Right mastectomy: Multifocal grade 2 IDC 0.8 cm and 0.6 cm with high-grade DCIS, margins negative, 0/4 lymph nodes ER 95%, PR 5%, Ki-67 10%, HER-2 negative;  Left mastectomy: IDC 0.6 cm, LVI, margins negative, 1/4 lymph nodes positive, ER 60%, PR 60%, HER-2 negative, Ki-67 50% RCB class II   11/30/2019 - 01/08/2020 Radiation Therapy   Adjuvant radiation    Anti-estrogen oral therapy   Ibrance Letrozole, ZMeribeth Mattes  02/21/2021 -  Chemotherapy   Patient is on Treatment Plan : BREAST METASTATIC fam-trastuzumab deruxtecan-nxki (Enhertu) q21d     [place]astatic breast cancer ([place]  02/13/2021 Initial Diagnosis   [place]   02/21/2021 -  Chemotherapy   Patient is on Treatment Plan : BREAST METASTATIC fam-trastuzumab deruxtecan-nxki (Enhertu) q21d        REVIEW OF SYSTEMS:   Constitutional: Denies fevers, chills  Eyes: Denies blurriness of vision Ears, nose, mouth, throat, and face: Denies mucositis or sore throat Respiratory: Denies cough, dyspnea or wheezes Cardiovascular: Denies palpitation, chest discomfort Gastrointestinal: Denies abdominal pain Skin: Denies abnormal skin rashes Lymphatics: Denies new lymphadenopathy or easy bruising Neurological:Denies numbness, tingling or new weaknesses Behavioral/Psych: Mood is stable, no new changes  Extremities: No lower extremity edema All other systems were reviewed with the patient and are negative.  I have reviewed the past medical history,  past surgical history, social history and family history with the  patient and they are unchanged from previous note.   PHYSICAL EXAMINATION: ECOG PERFORMANCE STATUS: 1 - Symptomatic but completely ambulatory  Vitals:   02/23/21 0155 02/23/21 0610  BP: 111/68 110/69  Pulse: (!) 131 (!) 134  Resp: 18   Temp: 98.7 F (37.1 C) 98.6 F (37 C)  SpO2: 96% 95%   Filed Weights   02/20/21 1837  Weight: 81.2 kg    Intake/Output from previous day: 12/14 0701 - 12/15 0700 In: 120 [P.O.:120] Out: 800 [Urine:800]  GENERAL: More somnolent but can answer questions SKIN: skin color, texture, turgor are normal, no rashes or significant lesions EYES: Scleral icterus present OROPHARYNX:no exudate, no erythema and lips, buccal mucosa, and tongue normal  LUNGS: clear to auscultation and percussion with normal breathing effort HEART: regular rate & rhythm and no murmurs and no lower extremity edema ABDOMEN:abdomen soft, non-tender and normal bowel sounds NEURO: alert & oriented x 3 with fluent speech, no focal motor/sensory deficits  LABORATORY DATA:  I have reviewed the data as listed CMP Latest Ref Rng & Units 02/23/2021 02/22/2021 02/21/2021  Glucose 70 - 99 mg/dL 231(H) 181(H) 113(H)  BUN 6 - 20 mg/dL 23(H) 12 9  Creatinine 0.44 - 1.00 mg/dL 0.73 0.52 0.59  Sodium 135 - 145 mmol/L 138 136 134(L)  Potassium 3.5 - 5.1 mmol/L 4.1 4.0 3.5  Chloride 98 - 111 mmol/L 102 98 99  CO2 22 - 32 mmol/L 21(L) 25 23  Calcium 8.9 - 10.3 mg/dL 6.8(L) 7.4(L) 7.2(L)  Total Protein 6.5 - 8.1 g/dL 6.3(L) 6.3(L) 6.1(L)  Total Bilirubin 0.3 - 1.2 mg/dL 16.1(H) 14.6(H) 13.6(H)  Alkaline Phos 38 - 126 U/L 422(H) 257(H) 229(H)  AST 15 - 41 U/L 4,296(H) 1,254(H) 982(H)  ALT 0 - 44 U/L 394(H) 190(H) 143(H)    Lab Results  Component Value Date   WBC 2.3 (L) 02/23/2021   HGB 8.7 (L) 02/23/2021   HCT 27.2 (L) 02/23/2021   MCV 107.1 (H) 02/23/2021   PLT 17 (LL) 02/23/2021   NEUTROABS 0.7 (L) 02/23/2021    CT Abdomen Pelvis W Contrast  Result Date: 02/19/2021 CLINICAL  DATA:  Vomiting, abdominal pain after starting new cancer medication. Breast cancer with hepatic metastases. EXAM: CT ABDOMEN AND PELVIS WITH CONTRAST TECHNIQUE: Multidetector CT imaging of the abdomen and pelvis was performed using the standard protocol following bolus administration of intravenous contrast. CONTRAST:  40m OMNIPAQUE IOHEXOL 350 MG/ML SOLN COMPARISON:  MRI abdomen dated 02/09/2021. CT abdomen/pelvis dated 07/15/2020. FINDINGS: Lower chest: Lung bases are clear. Hepatobiliary: Innumerable/diffuse hepatic metastases, better evaluated on recent MR. Gallbladder is underdistended but grossly unremarkable. No intrahepatic or extrahepatic ductal dilatation. Pancreas: Within normal limits. Spleen: Within normal limits. Adrenals/Urinary Tract: Adrenal glands are within normal limits. Kidneys are within normal limits.  No hydronephrosis. Bladder is within normal limits. Stomach/Bowel: Stomach is within normal limits. No evidence of bowel obstruction. Normal appendix (series 2/image 69). No colonic wall thickening or inflammatory changes. Vascular/Lymphatic: No evidence of abdominal aortic aneurysm. No suspicious abdominopelvic lymphadenopathy. Reproductive: Uterus is within normal limits. Bilateral ovaries are within normal limits. Other: Small volume pelvic ascites. No frank peritoneal nodularity/omental caking. Musculoskeletal: Multifocal lytic and sclerotic osseous metastases involving the visualized axial and appendicular skeleton, unchanged from recent MRI. IMPRESSION: No evidence of bowel obstruction.  Normal appendix. Innumerable hepatic metastases, unchanged from recent MRI. Multifocal lytic and sclerotic osseous metastases, unchanged from recent MRI. Small volume pelvic ascites. No frank  peritoneal nodularity/omental caking. Electronically Signed   By: Julian Hy M.D.   On: 02/19/2021 23:23   MR 3D Recon At Scanner  Result Date: 02/09/2021 CLINICAL DATA:  History of breast cancer.   Jaundice. EXAM: MRI ABDOMEN WITHOUT AND WITH CONTRAST (INCLUDING MRCP) TECHNIQUE: Multiplanar multisequence MR imaging of the abdomen was performed both before and after the administration of intravenous contrast. Heavily T2-weighted images of the biliary and pancreatic ducts were obtained, and three-dimensional MRCP images were rendered by post processing. CONTRAST:  8.70m GADAVIST GADOBUTROL 1 MMOL/ML IV SOLN [facility] FINDINGS: Lower chest: Normal heart size without pericardial or pleural effusion. Hepatobiliary: Diffuse hepatic metastasis, with lesions replacing nearly the entire hepatic parenchyma on the order of 1.3 cm. Normal gallbladder. No intra or extrahepatic biliary duct dilatation. No obstructive stone or mass. Pancreas:  Normal, without mass or ductal dilatation. Spleen:  Normal in size, without focal abnormality. Adrenals/Urinary Tract: Normal adrenal glands. Too small to characterize lesions in both kidneys. No hydronephrosis. Stomach/Bowel: Normal stomach and abdominal bowel loops. Vascular/Lymphatic: Normal caliber of the aorta and branch vessels. No retroperitoneal or retrocrural adenopathy. Other:  No ascites.  No evidence of omental or peritoneal disease. Musculoskeletal: Diffuse osseous metastasis, as have been detailed previously. IMPRESSION: 1. Since 07/15/2020, development of diffuse hepatic metastasis, nearly completely replacing the hepatic parenchyma. 2. No biliary duct dilatation or alternate explanation for jaundice. 3. Diffuse osseous metastasis, as detailed previously. Electronically Signed   By: KAbigail MiyamotoM.D.   On: 02/09/2021 21:10   [place] (LIVER)  Result Date: 02/10/2021 INDICATION: 30year old woman with history of breast cancer presents to IR for biopsy of new diffuse liver lesions. EXAM: Ultrasound-guided biopsy of left liver mass MEDICATIONS: None. ANESTHESIA/SEDATION: Moderate (conscious) sedation was employed during this  procedure. A total of Versed 2 mg and Fentanyl 50 mcg was administered intravenously. Moderate Sedation Time: 10 minutes. The patient's level of consciousness and vital signs were monitored continuously by radiology nursing throughout the procedure under my direct supervision. COMPLICATIONS: None immediate. PROCEDURE: Informed written consent was obtained from the patient after a thorough discussion of the procedural risks, benefits and alternatives. All questions were addressed. Maximal Sterile Barrier Technique was utilized including caps, mask, sterile gowns, sterile gloves, sterile drape, hand hygiene and skin antiseptic. A timeout was performed prior to the initiation of the procedure. Patient position supine on the ultrasound table. Epigastric skin prepped and draped in usual sterile fashion. Following local lidocaine administration, 17 gauge introducer needle was advanced into the left hepatic lobe, and three 18 gauge cores were obtained utilizing continuous ultrasound guidance. Gelfoam slurry was administered through the introducer needle at the biopsy site. Samples were sent to pathology in formalin. Needle removed and hemostasis achieved with 5 minutes of manual compression. Post procedure ultrasound images showed no evidence of significant hemorrhage. IMPRESSION: Ultrasound-guided biopsy of left liver lesions. Electronically Signed   By: FMiachel RouxM.D.   On: 02/10/2021 11:31   MR ABDOMEN MRCP W WO CONTAST  Result Date: 02/09/2021 CLINICAL DATA:  History of breast cancer.  Jaundice. EXAM: MRI ABDOMEN WITHOUT AND WITH CONTRAST (INCLUDING MRCP) TECHNIQUE: Multiplanar multisequence MR imaging of the abdomen was performed both before and after the administration of intravenous contrast. Heavily T2-weighted images of the biliary and pancreatic ducts were obtained, and three-dimensional MRCP images were rendered by post processing. CONTRAST:  8.525mGADAVIST GADOBUTROL 1 MMOL/ML IV SOLN [facility]   [facility]. FINDINGS: Lower chest: Normal  heart size without pericardial or pleural effusion. Hepatobiliary: Diffuse hepatic metastasis, with lesions replacing nearly the entire hepatic parenchyma on the order of 1.3 cm. Normal gallbladder. No intra or extrahepatic biliary duct dilatation. No obstructive stone or mass. Pancreas:  Normal, without mass or ductal dilatation. Spleen:  Normal in size, without focal abnormality. Adrenals/Urinary Tract: Normal adrenal glands. Too small to characterize lesions in both kidneys. No hydronephrosis. Stomach/Bowel: Normal stomach and abdominal bowel loops. Vascular/Lymphatic: Normal caliber of the aorta and branch vessels. No retroperitoneal or retrocrural adenopathy. Other:  No ascites.  No evidence of omental or peritoneal disease. Musculoskeletal: Diffuse osseous metastasis, as have been detailed previously. IMPRESSION: 1. Since 07/15/2020, development of diffuse hepatic metastasis, nearly completely replacing the hepatic parenchyma. 2. No biliary duct dilatation or alternate explanation for jaundice. 3. Diffuse osseous metastasis, as detailed previously. Electronically Signed   By: Abigail Miyamoto M.D.   On: 02/09/2021 21:10    ASSESSMENT AND PLAN: 1.  Metastatic breast cancer, ER/PR positive, HER2 negative ratio was 1.25 2.  Pancytopenia 3.  Severe hyperbilirubinemia and transaminitis  -Status post Enhertu on 02/21/2021.  Unfortunately, liver function continues to worsen. -I discussed lab findings with her family members today.  They are aware of the overall poor prognosis. -Palliative care is following.  At this point, would recommend shifting focus to comfort measures and getting hospice involved.  Future Appointments  Date Time Provider [place]  02/27/2021 11:30 AM Pickenpack-Cousar, Carlena Sax, NP [facility] None      LOS: 3 days   Mikey Bussing, DNP, AGPCNP-BC, AOCNP 02/23/21  Attending Note  I personally saw the  patient, reviewed the chart and examined the patient. The plan of care was discussed with the patients family and the admitting team. I agree with the assessment and plan as documented above.  Metastatic breast cancer rapidly progressing in spite of recent chemotherapy. There does not appear to be any visible improvement. I agree with hospice care and offered the patient and her family support and prayers.

## 2021-02-23 NOTE — Progress Notes (Signed)
Palliative:  HPI:  30 y.o. female  with past medical history of anemia, CHF, malignant left-sided breast cancer with bone and liver mets admitted on 02/19/2021 with abd pain, nausea, vomiting, severe jaundice.     I met today with Melissa [name]'s family. Melissa [name]'s mentation is fluctuating and I explained to her mother, father, brother Melissa [name], sister Melissa [name] that Melissa [name]'s condition continues to decline. Unfortunately despite our best efforts her liver continues to fail. I explained that the chemotherapy was our last idea to try and help her liver but this has not been successful. I gently explained that we have no other interventions that can improve Melissa [name]'s condition and she is at the end of her life. Family were appropriately tearful but also understand. They even express appreciation for the team and all that everyone has done for Melissa [name].   We further discussed where to go from here. I discussed with them code status and recommendation for DNR given the pain this can cause and also this will not reverse her liver failure or cancer progression. After discussion they all agree with DNR. We also discussed focusing on comfort beginning now and discussed hospice options. Mother does not want her to go to hospice facility. 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 They are leaning towards home with hospice. I also explained to them [facility] to help them answer questions and explain to Melissa [name]'s daughters as well as hospice bereavement services. I further explained that you do not need to be connected with hospice to utilize these free services and they can seek out any hospice in their local area that provides these services. We also discussed her wishes for her children and I assisted to send a notary to help her document her wishes.   UPDATE: I followed up with brochures for hospice services as well as hand print kit for [name] and her girls. Melissa [name] confirms that they do wish to work towards taking Melissa [name] home  with hospice and will decided on their desire for hospital bed or not. We discussed continuation of lactulose to make attempt to allow her some time with her girls if at all possible. They would desire oxygen if indicated as well. They have had visit from chaplain. Visitation restrictions have also been lifted to allow for end of life exception.   All questions/concerns addressed. Emotional support provided.   Exam: Fluctuating mentation and lethargic at time of my visit. More awake and interactive at time of notary visit fortunately. No distress. Breathing regular, unlabored. Moves all extremities.   Plan: - DNR - Comfort focused care - Home with hospice - Continue current pain and medication regimen  60 min  Vinie Sill, NP Palliative Medicine Team Pager [phone] (Please see [email or web address] for schedule) Team Phone [phone]    Greater than 50%  of this time was spent counseling and coordinating care related to the above assessment and plan

## 2021-02-23 NOTE — Progress Notes (Signed)
Chaplain tried to engage in an initial visit with Melissa [name] but she was resting peacefully.  Chaplain did not want to wake her.  Chaplain was able to speak with her brother at her bedside and check-in with him.  Chaplain will follow-up.    02/23/21 0900  Clinical Encounter Type  Visited With Patient and family together;Patient not available  Visit Type Initial

## 2021-02-23 NOTE — TOC Initial Note (Signed)
Transition of Care [facility]) - Initial/Assessment Note    Patient Details  Name: Melissa [name] MRN: [id number] Date of Birth: [birth year]  [facility] CM/SW Contact:    Satya Buttram, Marjie Skiff, RN Phone Number: 02/23/2021, 2:34 PM  Clinical Narrative:                 TOC was alerted by PMT that family meeting had occurred and decision was made to take pt home with [facility]. [facility] liaison contacted for referral. TOC will continue to follow and assist with dc.  Expected Discharge Plan: Home w Hospice Care Barriers to Discharge: Continued Medical Work up   Patient Goals and CMS Choice     Choice offered to / list presented to : Sibling  Expected Discharge Plan and Services Expected Discharge Plan: Home w Hospice Care   Discharge Planning Services: CM Consult Post Acute Care Choice: Hospice Living arrangements for the past 2 months: Apartment                           HH Arranged: Disease Management [place] Agency: [facility] Date [place]: 02/23/21 Time [place]: 43 Representative spoke with at [place]: [place] Arrangements/Services Living arrangements for the past 2 months: [place] with:: Minor Children          Need for Family Participation in Patient Care: Yes (Comment) Care giver support system in place?: Yes (comment)   Criminal Activity/Legal Involvement Pertinent to Current Situation/Hospitalization: No - Comment as needed  Activities of Daily Living Home Assistive Devices/Equipment: Contact lenses ADL Screening (condition at time of admission) Patient's cognitive ability adequate to safely complete daily activities?: Yes Is the patient deaf or have difficulty hearing?: No Does the patient have difficulty seeing, even when wearing glasses/contacts?: Yes Does the patient have difficulty concentrating, remembering, or making decisions?: No Patient able to express need for assistance with  ADLs?: Yes Does the patient have difficulty dressing or bathing?: No Independently performs ADLs?: Yes (appropriate for developmental age) Does the patient have difficulty walking or climbing stairs?: No Weakness of Legs: None Weakness of Arms/Hands: None   Emotional Assessment         Alcohol / Substance Use: Not Applicable Psych Involvement: No (comment)  Admission diagnosis:  RUQ abdominal pain [R10.11] Right upper quadrant abdominal pain [R10.11] Intractable nausea and vomiting [R11.2] Nausea and vomiting, unspecified vomiting type [R11.2] Patient Active Problem List   Diagnosis Date Noted   Intractable nausea and vomiting 02/20/2021   RUQ abdominal pain 02/19/2021   Pancytopenia ([place]) 02/19/2021   Metastatic breast cancer ([place]) 02/13/2021   Abdominal pain 02/09/2021   Hepatic metastasis ([place]) 02/09/2021   Elevated liver enzymes 02/09/2021   Breast cancer, left breast ([place]) 10/08/2019   Genetic testing 04/15/2019   Port-A-Cath in place 03/25/2019   Family history of breast cancer    Family history of colon cancer    Family history of throat cancer    Malignant neoplasm of overlapping sites of left breast in female, estrogen receptor positive ([place]) 03/04/2019   PCOS (polycystic ovarian syndrome) 08/22/2012   Anovulation 07/09/2012   ASCUS (atypical squamous cells of undetermined significance) on Pap smear 12/04/2010   PCP:  [name], NP Pharmacy:   [id number] [address] [place] [address] Phone: [phone] Fax: [phone]     Social Determinants of Health (SDOH)  Interventions    Readmission Risk Interventions No flowsheet data found.

## 2021-02-23 NOTE — Progress Notes (Signed)
Chaplain offered support to Anaiyah's family.  Chaplain offered prayer over Denice with her dad present at bedside.  Chaplain also checked in with family (mom, sister, and brother) who were in the conference room.  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  Her sister also talked about Harriett's discovery of having cancer while she was pregnant.  Sister conveyed how strong and resilient Mandy has been.  Mom described her as being strong willed and stubborn.    Yavonne and her siblings are very close.  Her brother was often her confidant when she felt like she would not live long. She loved being a mom and an aunt.  She would often have all her nieces and nephews come over to her home and watch them all.  She was very family oriented. Family desires for some of her nieces and nephews to be able to see her as well.  Chaplain is willing and able to assist if this possible.  Chaplain provided listening, support, prayer and presence.     02/23/21 1200  Clinical Encounter Type  Visited With Patient and family together  Visit Type Follow-up;Spiritual support  Spiritual Encounters  Spiritual Needs Prayer  Stress Factors  Family Stress Factors Major life changes;Loss of control

## 2021-02-23 NOTE — Progress Notes (Signed)
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  Patient states was feeling well for a few days after recent hospital admission, but over the last 3 days has been having more significant right upper quadrant and right flank abdominal pain.     Recently admitted 02/09/2021 - 02/11/2021 for abdominal pain and severe jaundice.  Liver MRI showed new diffuse hepatic metastases nearly completely replacing the hepatic parenchyma.  There is no evidence of obstruction.  Liver biopsy performed on 02/10/2021 shows metastatic carcinoma.  Patient has plan to start systemic chemotherapy with Enhertu on 02/20/2021 per her oncologist Dr. Lindi Adie.   CT abdomen/pelvis with no evidence of bowel obstruction, normal appendix, and hepatic metastases unchanged from recent MRI, multifocal lytic and sclerotic osseous metastases unchanged from previous imaging, small volume pelvic ascites without frank peritoneal nodularity/omental caking.  Patient was given 1 L NS, IV Dilaudid 0.5 mg x 2, Zofran.  The hospital service was consulted for further evaluation management of intractable nausea/vomiting and worsening abdominal pain.  Oncology as well as palliative care medicine has been consulted.   New events last 24 hours / Subjective: Per RN, patient has been having some confusion overnight, required frequent reorienting.  This morning, patient has no complaints, but does seem more confused than my previous examination.  No family was at bedside.  Discussed with palliative care as well as oncology, with her worsening liver failure, recommended hospice.  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 Lindi Adie -Status post chemotherapy, bilateral mastectomy, adjuvant radiation, antiestrogen therapy -Started on chemotherapy as inpatient 12/13  -Palliative care also following  Cancer associated pain, intractable nausea and vomiting -Appreciate palliative care following for symptom management  Liver failure, hyperbilirubinemia, pancytopenia, hyperammonia -Likely secondary to underlying malignancy and metastasis -LFT has worsened significantly  Fever -Fever 100.8 on the morning of 12/13, has been afebrile since  -Blood cultures pending    DVT prophylaxis:  SCDs Start: 02/20/21 0004  Code Status: DNR Family Communication: No family at bedside Disposition Plan:  Status is: Inpatient  Remains inpatient appropriate because: Recommend hospice   Antimicrobials:  Anti-infectives (From admission, onward)    None        Objective: Vitals:   02/22/21 2011 02/22/21 2230 02/23/21 0155 02/23/21 0610  BP: 130/82 119/76 111/68 110/69  Pulse: (!) 127 (!) 137 (!) 131 (!) 134  Resp: 14 18 18    Temp: 98.9 F (37.2 C) 99 F (37.2 C) 98.7 F (37.1 C) 98.6 F (37 C)  TempSrc: Oral Oral Oral Oral  SpO2: 96% 96% 96% 95%  Weight:      Height:        Intake/Output Summary (Last 24 hours) at 02/23/2021 1346 Last data filed at 02/22/2021 1700 Gross per 24 hour  Intake --  Output 800 ml  Net -800 ml  Filed Weights   02/20/21 1837  Weight: 81.2 kg    Examination:  General exam: Appears in no acute distress but somewhat confused, restless General exam: Appears calm and comfortable  Respiratory system: Clear to auscultation.   Cardiovascular system: S1 & S2 heard, tachycardic. No pedal edema. Gastrointestinal system: Abdomen is nondistended, soft  Central nervous system: Alert   Data Reviewed: I have personally reviewed following labs and imaging studies  CBC: Recent Labs  Lab 02/19/21 2011 02/19/21 2350 02/20/21 0552 02/21/21 0544 02/22/21 0550 02/23/21 0208  WBC 3.4*  --  3.1* 3.3* 2.8* 2.3*  NEUTROABS CANCELLED BY LAB 1.5*  --   --   --  0.7*  HGB 10.9*  --  9.4* 9.1* 9.3* 8.7*  HCT 33.2*  --  29.4* 28.3* 28.6* 27.2*  MCV 101.2*  --  105.0* 104.8* 104.0* 107.1*  PLT 36*  --  30* 24* 21* 17*    Basic Metabolic Panel: Recent Labs  Lab 02/19/21 2011 02/20/21 0552 02/21/21 0544 02/22/21 0550 02/23/21 0208  NA 135 136 134* 136 138  K 3.7 3.5 3.5 4.0 4.1  CL 97* 101 99 98 102  CO2 25 25 23 25  21*  GLUCOSE 144* 113* 113* 181* 231*  BUN 13 11 9 12  23*  CREATININE 0.71 0.69 0.59 0.52 0.73  CALCIUM 8.1* 7.3* 7.2* 7.4* 6.8*  MG  --   --  2.8*  --   --     GFR: Estimated Creatinine Clearance: 99.6 mL/min (by C-G formula based on SCr of 0.73 mg/dL). Liver Function Tests: Recent Labs  Lab 02/19/21 2011 02/20/21 1572 02/21/21 0544 02/22/21 0550 02/23/21 0208  AST 592* 549* 982* 1,254* 4,296*  ALT 144* 122* 143* 190* 394*  ALKPHOS 246* 226* 229* 257* 422*  BILITOT 14.7* 13.1* 13.6* 14.6* 16.1*  PROT 7.5 6.2* 6.1* 6.3* 6.3*  ALBUMIN 3.1* 2.6* 2.6* 2.6* 2.6*    Recent Labs  Lab 02/19/21 2011 02/21/21 0544  LIPASE 177* 265*    Recent Labs  Lab 02/22/21 0550  AMMONIA 107*    Coagulation Profile: Recent Labs  Lab 02/20/21 0552  INR 1.7*    Cardiac Enzymes: No results for input(s): CKTOTAL, CKMB, CKMBINDEX, TROPONINI in the last 168 hours. BNP (last 3 results) No results for input(s): PROBNP in the last 8760 hours. HbA1C: No results for input(s): HGBA1C in the last 72 hours. CBG: Recent Labs  Lab 02/22/21 2358  GLUCAP 218*   Lipid Profile: No results for input(s):  CHOL, HDL, LDLCALC, TRIG, CHOLHDL, LDLDIRECT in the last 72 hours. Thyroid Function Tests: No results for input(s): TSH, T4TOTAL, FREET4, T3FREE, THYROIDAB in the last 72 hours. Anemia Panel: No results for input(s): VITAMINB12, FOLATE, FERRITIN, TIBC, IRON, RETICCTPCT in the last 72 hours. Sepsis Labs: Recent Labs  Lab 02/19/21 2023 02/19/21 2234 02/20/21 0552 02/21/21 0544  LATICACIDVEN 4.6* 3.5* 3.4* 3.7*     Recent Results (from the past 240 hour(s))  Resp Panel by RT-PCR (Flu A&B, Covid) Nasopharyngeal Swab     Status: None   Collection Time: 02/19/21  8:00 PM   Specimen: Nasopharyngeal Swab; Nasopharyngeal(NP) swabs in vial transport medium  Result Value Ref Range Status   SARS Coronavirus 2 by RT PCR NEGATIVE NEGATIVE Final    Comment: (NOTE) SARS-CoV-2 target nucleic acids are NOT DETECTED.  The SARS-CoV-2 RNA is generally detectable in upper respiratory specimens during the acute phase of infection. The lowest concentration of SARS-CoV-2 viral copies this assay can detect is 138 copies/mL.  A negative result does not preclude SARS-Cov-2 infection and should not be used as the sole basis for treatment or other patient management decisions. A negative result may occur with  improper specimen collection/handling, submission of specimen other than nasopharyngeal swab, presence of viral mutation(s) within the areas targeted by this assay, and inadequate number of viral copies(<138 copies/mL). A negative result must be combined with clinical observations, patient history, and epidemiological information. The expected result is Negative.  Fact Sheet for Patients:  EntrepreneurPulse.com.au  Fact Sheet for Healthcare Providers:  IncredibleEmployment.be  This test is no t yet approved or cleared by the [place] FDA and  has been authorized for detection and/or diagnosis of SARS-CoV-2 by FDA under an Emergency Use Authorization (EUA).  This EUA will remain  in effect (meaning this test can be used) for the duration of the COVID-19 declaration under Section 564(b)(1) of the Act, 21 U.S.C.section 360bbb-3(b)(1), unless the authorization is terminated  or revoked sooner.       Influenza A by PCR NEGATIVE NEGATIVE Final   Influenza B by PCR NEGATIVE NEGATIVE Final    Comment: (NOTE) The Xpert Xpress SARS-CoV-2/FLU/RSV plus assay is intended as an aid in the diagnosis of influenza from Nasopharyngeal swab specimens and should not be used as a sole basis for treatment. Nasal washings and aspirates are unacceptable for Xpert Xpress SARS-CoV-2/FLU/RSV testing.  Fact Sheet for Patients: EntrepreneurPulse.com.au  Fact Sheet for Healthcare Providers: IncredibleEmployment.be  This test is not yet approved or cleared by the [place] FDA and has been authorized for detection and/or diagnosis of SARS-CoV-2 by FDA under an Emergency Use Authorization (EUA). This EUA will remain in effect (meaning this test can be used) for the duration of the COVID-19 declaration under Section 564(b)(1) of the Act, 21 U.S.C. section 360bbb-3(b)(1), unless the authorization is terminated or revoked.  Performed at [facility], [place] [address], [place], [place] [address]   Culture, blood (routine x 2)     Status: None (Preliminary result)   Collection Time: 02/22/21  4:41 PM   Specimen: BLOOD  Result Value Ref Range Status   Specimen Description   Final    BLOOD LEFT ANTECUBITAL Performed at [facility] [address] [place], [place] [address]    Special Requests   Final    BOTTLES DRAWN AEROBIC AND ANAEROBIC Blood Culture adequate volume Performed at [facility] [address] [place], [place] [address]    Culture   Final    NO GROWTH < 12 HOURS Performed at [facility] [address] [place], [place] [address]    Report  Status PENDING  Incomplete  Culture, blood (routine x 2)     Status: None (Preliminary result)   Collection Time: 02/22/21  4:41 PM   Specimen: BLOOD  Result Value Ref Range Status   Specimen Description   Final    BLOOD LEFT ANTECUBITAL Performed at [facility] [address] [place], [place] [address]    Special Requests   Final    BOTTLES DRAWN AEROBIC ONLY Blood Culture results may not be optimal due to an inadequate volume of blood received in culture bottles Performed at [facility] [address], [place], [place] [address]    Culture   Final    NO GROWTH < 12 HOURS Performed at [facility] [address] [place], [place] [address]    Report Status PENDING  Incomplete  Radiology Studies: No results found.    Scheduled Meds:  Chlorhexidine Gluconate Cloth  6 each Topical Daily   feeding supplement  1 Container Oral TID BM   lactulose  30 g Oral BID   lidocaine  1 patch Transdermal Q24H   oxyCODONE  20 mg Oral Q12H   Continuous Infusions:  promethazine (PHENERGAN) injection (IM or IVPB) 6.25 mg (02/22/21 2218)     LOS: 3 days      Time spent: 25 minutes   Dessa Phi, DO [facility] 02/23/2021, 1:46 PM   Available via Epic secure chat 7am-7pm After these hours, please refer to coverage provider listed on [email or web address]

## 2021-02-24 DIAGNOSIS — Z515 Encounter for palliative care: Secondary | ICD-10-CM

## 2021-02-24 DIAGNOSIS — Z66 Do not resuscitate: Secondary | ICD-10-CM

## 2021-02-24 MED ORDER — LIP MEDEX EX OINT
TOPICAL_OINTMENT | CUTANEOUS | Status: AC
  Filled 2021-02-24: qty 7

## 2021-02-24 MED ORDER — HYDROMORPHONE HCL 1 MG/ML IJ SOLN
0.5000 mg | INTRAMUSCULAR | Status: DC | PRN
Start: 2021-02-24 — End: 2021-02-24
  Administered 2021-02-24: 1 mg via INTRAVENOUS
  Filled 2021-02-24: qty 1

## 2021-02-24 MED ORDER — GLYCOPYRROLATE 0.2 MG/ML IJ SOLN
0.4000 mg | INTRAMUSCULAR | Status: DC
Start: 2021-02-24 — End: 2021-02-24
  Administered 2021-02-24: 0.4 mg via INTRAVENOUS
  Filled 2021-02-24 (×2): qty 2

## 2021-02-24 MED ORDER — LIP MEDEX EX OINT: TOPICAL_OINTMENT | CUTANEOUS | Status: DC | PRN

## 2021-02-24 MED ORDER — HYDROMORPHONE HCL 1 MG/ML IJ SOLN
0.5000 mg | INTRAMUSCULAR | Status: DC
  Administered 2021-02-24 (×2): 0.5 mg via INTRAVENOUS
  Filled 2021-02-24 (×2): qty 0.5

## 2021-02-27 ENCOUNTER — Inpatient Hospital Stay: Payer: Medicaid Other | Admitting: Nurse Practitioner

## 2021-02-27 LAB — CULTURE, BLOOD (ROUTINE X 2)
Culture: NO GROWTH
Culture: NO GROWTH
Special Requests: ADEQUATE

## 2021-03-10 ENCOUNTER — Other Ambulatory Visit (HOSPITAL_COMMUNITY): Payer: Self-pay

## 2021-03-12 NOTE — Discharge Summary (Signed)
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  Patient states was feeling well for a few days after recent hospital admission, but over the last 3 days has been having more significant right upper quadrant and right flank abdominal pain.     Recently admitted 02/09/2021 - 02/11/2021 for abdominal pain and severe jaundice.  Liver MRI showed new diffuse hepatic metastases nearly completely replacing the hepatic parenchyma.  There is no evidence of obstruction.  Liver biopsy performed on 02/10/2021 shows metastatic carcinoma.  Patient has plan to start systemic chemotherapy with Enhertu on 02/20/2021 per her oncologist Dr. Lindi Adie.   CT abdomen/pelvis with no evidence of bowel obstruction, normal appendix, and hepatic metastases unchanged from recent MRI, multifocal lytic and sclerotic osseous metastases unchanged from previous imaging, small volume pelvic ascites without frank peritoneal nodularity/omental caking.  Patient was given 1 L NS, IV Dilaudid 0.5 mg x 2, Zofran.  The hospital service was consulted for further evaluation management of intractable nausea/vomiting and worsening abdominal pain.  Oncology as well as palliative care medicine has been consulted.   She received chemo 12/13. However, her liver function continued to decline and she clinically decompensated with restlessness, intermittent confusion, and hematemesis. She was discharged to hospice with end of life care.    Discharge Diagnoses:  Principal Problem:   Metastatic breast cancer [facility]) Active Problems:   Malignant neoplasm of overlapping sites of left breast in female, estrogen receptor positive ([place])   Breast cancer, left breast ([place])   Hepatic metastasis (HCC)   Elevated liver enzymes   RUQ abdominal pain   Pancytopenia (HCC)   Intractable nausea and vomiting   DNR (do not resuscitate)   End of life care    Discharge Instructions    Allergies as of 02/18/2021   No Known Allergies      Medication List     STOP taking these medications    anastrozole 1 MG tablet Commonly known as: ARIMIDEX   Ibrance 100 MG tablet Generic drug: palbociclib   lidocaine-prilocaine cream Commonly known as: EMLA   LORazepam 0.5 MG tablet Commonly known as: Ativan   ondansetron 4 MG disintegrating tablet Commonly known as: ZOFRAN-ODT   ondansetron 8 MG tablet Commonly known as: Zofran   oxyCODONE 5 MG immediate release tablet Commonly known as: Oxy IR/ROXICODONE   polyethylene glycol 17 g packet Commonly known as: MiraLax   prochlorperazine 10 MG tablet Commonly known as: COMPAZINE        No Known Allergies  Consultations: Oncology Palliative care    Procedures/Studies: CT Abdomen Pelvis W Contrast  Result Date: 02/19/2021 CLINICAL DATA:  Vomiting, abdominal pain after starting new cancer medication. Breast cancer with hepatic metastases. EXAM: CT ABDOMEN AND PELVIS WITH CONTRAST TECHNIQUE: Multidetector CT imaging of the abdomen and pelvis was performed using the standard protocol following bolus administration of intravenous contrast. CONTRAST:  55mL OMNIPAQUE IOHEXOL 350 MG/ML SOLN COMPARISON:  MRI abdomen dated 02/09/2021. CT abdomen/pelvis dated 07/15/2020. FINDINGS: Lower chest: Lung bases are clear. Hepatobiliary: Innumerable/diffuse hepatic metastases, better evaluated on recent MR. Gallbladder is  underdistended but grossly unremarkable. No intrahepatic or  extrahepatic ductal dilatation. Pancreas: Within normal limits. Spleen: Within normal limits. Adrenals/Urinary Tract: Adrenal glands are within normal limits. Kidneys are within normal limits.  No hydronephrosis. Bladder is within normal limits. Stomach/Bowel: Stomach is within normal limits. No evidence of bowel obstruction. Normal appendix (series 2/image 69). No colonic wall thickening or inflammatory changes. Vascular/Lymphatic: No evidence of abdominal aortic aneurysm. No suspicious abdominopelvic lymphadenopathy. Reproductive: Uterus is within normal limits. Bilateral ovaries are within normal limits. Other: Small volume pelvic ascites. No frank peritoneal nodularity/omental caking. Musculoskeletal: Multifocal lytic and sclerotic osseous metastases involving the visualized axial and appendicular skeleton, unchanged from recent MRI. IMPRESSION: No evidence of bowel obstruction.  Normal appendix. Innumerable hepatic metastases, unchanged from recent MRI. Multifocal lytic and sclerotic osseous metastases, unchanged from recent MRI. Small volume pelvic ascites. No frank peritoneal nodularity/omental caking. Electronically Signed   By: Julian Hy M.D.   On: 02/19/2021 23:23   MR 3D Recon At Scanner  Result Date: 02/09/2021 CLINICAL DATA:  History of breast cancer.  Jaundice. EXAM: MRI ABDOMEN WITHOUT AND WITH CONTRAST (INCLUDING MRCP) TECHNIQUE: Multiplanar multisequence MR imaging of the abdomen was performed both before and after the administration of intravenous contrast. Heavily T2-weighted images of the biliary and pancreatic ducts were obtained, and three-dimensional MRCP images were rendered by post processing. CONTRAST:  8.3mL GADAVIST GADOBUTROL 1 MMOL/ML IV SOLN COMPARISON:  [facility] CT of 02/07/2021. FINDINGS: Lower chest: Normal heart size without pericardial or pleural effusion. Hepatobiliary: Diffuse hepatic metastasis, with lesions replacing nearly the entire hepatic parenchyma on  the order of 1.3 cm. Normal gallbladder. No intra or extrahepatic biliary duct dilatation. No obstructive stone or mass. Pancreas:  Normal, without mass or ductal dilatation. Spleen:  Normal in size, without focal abnormality. Adrenals/Urinary Tract: Normal adrenal glands. Too small to characterize lesions in both kidneys. No hydronephrosis. Stomach/Bowel: Normal stomach and abdominal bowel loops. Vascular/Lymphatic: Normal caliber of the aorta and branch vessels. No retroperitoneal or retrocrural adenopathy. Other:  No ascites.  No evidence of omental or peritoneal disease. Musculoskeletal: Diffuse osseous metastasis, as have been detailed previously. IMPRESSION: 1. Since 07/15/2020, development of diffuse hepatic metastasis, nearly completely replacing the hepatic parenchyma. 2. No biliary duct dilatation or alternate explanation for jaundice. 3. Diffuse osseous metastasis, as detailed previously. Electronically Signed   By: Abigail Miyamoto M.D.   On: 02/09/2021 21:10   US BIOPSY (LIVER)  Result Date: 02/10/2021 INDICATION: 31 year old woman with history of breast cancer presents to IR for biopsy of new diffuse liver lesions. EXAM: Ultrasound-guided biopsy of left liver mass MEDICATIONS: None. ANESTHESIA/SEDATION: Moderate (conscious) sedation was employed during this procedure. A total of Versed 2 mg and Fentanyl 50 mcg was administered intravenously. Moderate Sedation Time: 10 minutes. The patient's level of consciousness and vital signs were monitored continuously by radiology nursing throughout the procedure under my direct supervision. COMPLICATIONS: None immediate. PROCEDURE: Informed written consent was obtained from the patient after a thorough discussion of the procedural risks, benefits and alternatives. All questions were addressed. Maximal Sterile Barrier Technique was utilized including caps, mask, sterile gowns, sterile gloves, sterile drape, hand hygiene and skin antiseptic. A timeout was performed  prior to the initiation of the procedure. Patient position supine on the ultrasound table. Epigastric skin prepped and draped in usual sterile fashion. Following local lidocaine administration, 17 gauge introducer needle was advanced into the left hepatic lobe, and three 18 gauge cores were obtained utilizing continuous ultrasound guidance. Gelfoam slurry was administered through  the introducer needle at the biopsy site. Samples were sent to pathology in formalin. Needle removed and hemostasis achieved with 5 minutes of manual compression. Post procedure ultrasound images showed no evidence of significant hemorrhage. IMPRESSION: Ultrasound-guided biopsy of left liver lesions. Electronically Signed   By: Miachel Roux M.D.   On: 02/10/2021 11:31   MR ABDOMEN MRCP W WO CONTAST  Result Date: 02/09/2021 CLINICAL DATA:  History of breast cancer.  Jaundice. EXAM: MRI ABDOMEN WITHOUT AND WITH CONTRAST (INCLUDING MRCP) TECHNIQUE: Multiplanar multisequence MR imaging of the abdomen was performed both before and after the administration of intravenous contrast. Heavily T2-weighted images of the biliary and pancreatic ducts were obtained, and three-dimensional MRCP images were rendered by post processing. CONTRAST:  8.42mL GADAVIST GADOBUTROL 1 MMOL/ML IV SOLN COMPARISON:  [facility] CT of 02/07/2021. FINDINGS: Lower chest: Normal heart size without pericardial or pleural effusion. Hepatobiliary: Diffuse hepatic metastasis, with lesions replacing nearly the entire hepatic parenchyma on the order of 1.3 cm. Normal gallbladder. No intra or extrahepatic biliary duct dilatation. No obstructive stone or mass. Pancreas:  Normal, without mass or ductal dilatation. Spleen:  Normal in size, without focal abnormality. Adrenals/Urinary Tract: Normal adrenal glands. Too small to characterize lesions in both kidneys. No hydronephrosis. Stomach/Bowel: Normal stomach and abdominal bowel loops. Vascular/Lymphatic: Normal caliber of the  aorta and branch vessels. No retroperitoneal or retrocrural adenopathy. Other:  No ascites.  No evidence of omental or peritoneal disease. Musculoskeletal: Diffuse osseous metastasis, as have been detailed previously. IMPRESSION: 1. Since 07/15/2020, development of diffuse hepatic metastasis, nearly completely replacing the hepatic parenchyma. 2. No biliary duct dilatation or alternate explanation for jaundice. 3. Diffuse osseous metastasis, as detailed previously. Electronically Signed   By: Abigail Miyamoto M.D.   On: 02/09/2021 21:10       Discharge Exam: Vitals:   03/07/2021 0449 02/20/2021 0638  BP: (!) 91/46   Pulse: (!) 126 (!) 140  Resp: 18   Temp: 97.8 F (36.6 C)   SpO2:  90%    General: Pt is somnolent, resting comfortably in bed, without acute distress, breathing comfortably on room air Multiple family members at bedside, grieving     The results of significant diagnostics from this hospitalization (including imaging, microbiology, ancillary and laboratory) are listed below for reference.     Microbiology: Recent Results (from the past 240 hour(s))  Resp Panel by RT-PCR (Flu A&B, Covid) Nasopharyngeal Swab     Status: None   Collection Time: 02/19/21  8:00 PM   Specimen: Nasopharyngeal Swab; Nasopharyngeal(NP) swabs in vial transport medium  Result Value Ref Range Status   SARS Coronavirus 2 by RT PCR NEGATIVE NEGATIVE Final    Comment: (NOTE) SARS-CoV-2 target nucleic acids are NOT DETECTED.  The SARS-CoV-2 RNA is generally detectable in upper respiratory specimens during the acute phase of infection. The lowest concentration of SARS-CoV-2 viral copies this assay can detect is 138 copies/mL. A negative result does not preclude SARS-Cov-2 infection and should not be used as the sole basis for treatment or other patient management decisions. A negative result may occur with  improper specimen collection/handling, submission of specimen other than nasopharyngeal swab,  presence of viral mutation(s) within the areas targeted by this assay, and inadequate number of viral copies(<138 copies/mL). A negative result must be combined with clinical observations, patient history, and epidemiological information. The expected result is Negative.  Fact Sheet for Patients:  EntrepreneurPulse.com.au  Fact Sheet for Healthcare Providers:  IncredibleEmployment.be  This test is no t  yet approved or cleared by the [place] and  has been authorized for detection and/or diagnosis of SARS-CoV-2 by FDA under an Emergency Use Authorization (EUA). This EUA will remain  in effect (meaning this test can be used) for the duration of the COVID-19 declaration under Section 564(b)(1) of the Act, 21 U.S.C.section 360bbb-3(b)(1), unless the authorization is terminated  or revoked sooner.       Influenza A by PCR NEGATIVE NEGATIVE Final   Influenza B by PCR NEGATIVE NEGATIVE Final    Comment: (NOTE) The Xpert Xpress SARS-CoV-2/FLU/RSV plus assay is intended as an aid in the diagnosis of influenza from Nasopharyngeal swab specimens and should not be used as a sole basis for treatment. Nasal washings and aspirates are unacceptable for Xpert Xpress SARS-CoV-2/FLU/RSV testing.  Fact Sheet for Patients: EntrepreneurPulse.com.au  Fact Sheet for Healthcare Providers: IncredibleEmployment.be  This test is not yet approved or cleared by the [place] FDA and has been authorized for detection and/or diagnosis of SARS-CoV-2 by FDA under an Emergency Use Authorization (EUA). This EUA will remain in effect (meaning this test can be used) for the duration of the COVID-19 declaration under Section 564(b)(1) of the Act, 21 U.S.C. section 360bbb-3(b)(1), unless the authorization is terminated or revoked.  Performed at [facility], [place] [address] [place], [place] [address]    Culture, blood (routine x 2)     Status: None (Preliminary result)   Collection Time: 02/22/21  4:41 PM   Specimen: BLOOD  Result Value Ref Range Status   Specimen Description   Final    BLOOD LEFT ANTECUBITAL Performed at [facility] [address] [place], [place] [address]    Special Requests   Final    BOTTLES DRAWN AEROBIC AND ANAEROBIC Blood Culture adequate volume Performed at [facility] [address], [place], [place] [address]    Culture   Final    NO GROWTH 2 DAYS Performed at [facility] [address], [place], [place] [address]    Report Status PENDING  Incomplete  Culture, blood (routine x 2)     Status: None (Preliminary result)   Collection Time: 02/22/21  4:41 PM   Specimen: BLOOD  Result Value Ref Range Status   Specimen Description   Final    BLOOD LEFT ANTECUBITAL Performed at [facility] [address], [place], [place] [address]    Special Requests   Final    BOTTLES DRAWN AEROBIC ONLY Blood Culture results may not be optimal due to an inadequate volume of blood received in culture bottles Performed at [facility] [address], [place], [place] [address]    Culture   Final    NO GROWTH 2 DAYS Performed at [facility] [address], [place],  [address]    Report Status PENDING  Incomplete     Labs: BNP (last 3 results) Recent Labs    05/12/20 1141  BNP 921.1*   Basic Metabolic Panel: Recent Labs  Lab 02/19/21 2011 02/20/21 0552 02/21/21 0544 02/22/21 0550 02/23/21 0208  NA 135 136 134* 136 138  K 3.7 3.5 3.5 4.0 4.1  CL 97* 101 99 98 102  CO2 25 25 23 25  21*  GLUCOSE 144* 113* 113* 181* 231*  BUN 13 11 9 12  23*  CREATININE 0.71 0.69 0.59 0.52 0.73  CALCIUM 8.1* 7.3* 7.2* 7.4* 6.8*  MG  --   --  2.8*  --   --  Liver Function Tests: Recent Labs  Lab 02/19/21 2011 02/20/21 0552 02/21/21 0544 02/22/21 0550  02/23/21 0208  AST 592* 549* 982* 1,254* 4,296*  ALT 144* 122* 143* 190* 394*  ALKPHOS 246* 226* 229* 257* 422*  BILITOT 14.7* 13.1* 13.6* 14.6* 16.1*  PROT 7.5 6.2* 6.1* 6.3* 6.3*  ALBUMIN 3.1* 2.6* 2.6* 2.6* 2.6*   Recent Labs  Lab 02/19/21 2011 02/21/21 0544  LIPASE 177* 265*   Recent Labs  Lab 02/22/21 0550  AMMONIA 107*   CBC: Recent Labs  Lab 02/19/21 2011 02/19/21 2350 02/20/21 0552 02/21/21 0544 02/22/21 0550 02/23/21 0208  WBC 3.4*  --  3.1* 3.3* 2.8* 2.3*  NEUTROABS CANCELLED BY LAB 1.5*  --   --   --  0.7*  HGB 10.9*  --  9.4* 9.1* 9.3* 8.7*  HCT 33.2*  --  29.4* 28.3* 28.6* 27.2*  MCV 101.2*  --  105.0* 104.8* 104.0* 107.1*  PLT 36*  --  30* 24* 21* 17*   Cardiac Enzymes: No results for input(s): CKTOTAL, CKMB, CKMBINDEX, TROPONINI in the last 168 hours. BNP: Invalid input(s): POCBNP CBG: Recent Labs  Lab 02/22/21 2358  GLUCAP 218*   D-Dimer No results for input(s): DDIMER in the last 72 hours. Hgb A1c No results for input(s): HGBA1C in the last 72 hours. Lipid Profile No results for input(s): CHOL, HDL, LDLCALC, TRIG, CHOLHDL, LDLDIRECT in the last 72 hours. Thyroid function studies No results for input(s): TSH, T4TOTAL, T3FREE, THYROIDAB in the last 72 hours.  Invalid input(s): FREET3 Anemia work up No results for input(s): VITAMINB12, FOLATE, FERRITIN, TIBC, IRON, RETICCTPCT in the last 72 hours. Urinalysis    Component Value Date/Time   COLORURINE AMBER (A) 02/22/2021 1705   APPEARANCEUR HAZY (A) 02/22/2021 1705   LABSPEC >=1.030 02/22/2021 1705   PHURINE 6.0 02/22/2021 1705   GLUCOSEU 100 (A) 02/22/2021 1705   HGBUR LARGE (A) 02/22/2021 1705   BILIRUBINUR LARGE (A) 02/22/2021 1705   KETONESUR NEGATIVE 02/22/2021 1705   PROTEINUR 100 (A) 02/22/2021 1705   UROBILINOGEN 0.2 06/19/2010 1113   NITRITE NEGATIVE 02/22/2021 1705   LEUKOCYTESUR NEGATIVE 02/22/2021 1705   Sepsis Labs Invalid input(s): PROCALCITONIN,  WBC,   LACTICIDVEN Microbiology Recent Results (from the past 240 hour(s))  Resp Panel by RT-PCR (Flu A&B, Covid) Nasopharyngeal Swab     Status: None   Collection Time: 02/19/21  8:00 PM   Specimen: Nasopharyngeal Swab; Nasopharyngeal(NP) swabs in vial transport medium  Result Value Ref Range Status   SARS Coronavirus 2 by RT PCR NEGATIVE NEGATIVE Final    Comment: (NOTE) SARS-CoV-2 target nucleic acids are NOT DETECTED.  The SARS-CoV-2 RNA is generally detectable in upper respiratory specimens during the acute phase of infection. The lowest concentration of SARS-CoV-2 viral copies this assay can detect is 138 copies/mL. A negative result does not preclude SARS-Cov-2 infection and should not be used as the sole basis for treatment or other patient management decisions. A negative result may occur with  improper specimen collection/handling, submission of specimen other than nasopharyngeal swab, presence of viral mutation(s) within the areas targeted by this assay, and inadequate number of viral copies(<138 copies/mL). A negative result must be combined with clinical observations, patient history, and epidemiological information. The expected result is Negative.  Fact Sheet for Patients:  EntrepreneurPulse.com.au  Fact Sheet for Healthcare Providers:  IncredibleEmployment.be  This test is no t yet approved or cleared by the [place] FDA and  has been authorized for detection and/or diagnosis of SARS-CoV-2  by FDA under an Emergency Use Authorization (EUA). This EUA will remain  in effect (meaning this test can be used) for the duration of the COVID-19 declaration under Section 564(b)(1) of the Act, 21 U.S.C.section 360bbb-3(b)(1), unless the authorization is terminated  or revoked sooner.       Influenza A by PCR NEGATIVE NEGATIVE Final   Influenza B by PCR NEGATIVE NEGATIVE Final    Comment: (NOTE) The Xpert Xpress SARS-CoV-2/FLU/RSV plus  assay is intended as an aid in the diagnosis of influenza from Nasopharyngeal swab specimens and should not be used as a sole basis for treatment. Nasal washings and aspirates are unacceptable for Xpert Xpress SARS-CoV-2/FLU/RSV testing.  Fact Sheet for Patients: EntrepreneurPulse.com.au  Fact Sheet for Healthcare Providers: IncredibleEmployment.be  This test is not yet approved or cleared by the [place] FDA and has been authorized for detection and/or diagnosis of SARS-CoV-2 by FDA under an Emergency Use Authorization (EUA). This EUA will remain in effect (meaning this test can be used) for the duration of the COVID-19 declaration under Section 564(b)(1) of the Act, 21 U.S.C. section 360bbb-3(b)(1), unless the authorization is terminated or revoked.  Performed at [facility], [place] [address], [place], [place] [address]   Culture, blood (routine x 2)     Status: None (Preliminary result)   Collection Time: 02/22/21  4:41 PM   Specimen: BLOOD  Result Value Ref Range Status   Specimen Description   Final    BLOOD LEFT ANTECUBITAL Performed at [facility] [address] [place], [place] [address]    Special Requests   Final    BOTTLES DRAWN AEROBIC AND ANAEROBIC Blood Culture adequate volume Performed at [facility] [address], [place], [place] [address]    Culture   Final    NO GROWTH 2 DAYS Performed at [facility] [address], [place], [place] [address]    Report Status PENDING  Incomplete  Culture, blood (routine x 2)     Status: None (Preliminary result)   Collection Time: 02/22/21  4:41 PM   Specimen: BLOOD  Result Value Ref Range Status   Specimen Description   Final    BLOOD LEFT ANTECUBITAL Performed at [facility] [address], [place], [place] [address]    Special Requests   Final    BOTTLES DRAWN AEROBIC ONLY Blood  Culture results may not be optimal due to an inadequate volume of blood received in culture bottles Performed at [facility] [address] [place], [place] [address]    Culture   Final    NO GROWTH 2 DAYS Performed at [facility] [address], [place],  [address]    Report Status PENDING  Incomplete     Patient was seen and examined on the day of discharge and was found to be in stable condition. Time coordinating discharge: 35 minutes including assessment and coordination of care, as well as examination of the patient.   SIGNED:  Dessa Phi, DO [facility] 02/28/2021, 9:11 AM

## 2021-03-12 NOTE — Progress Notes (Signed)
Palliative:  HPI:  31 y.o. female  with past medical history of anemia, CHF, malignant left-sided breast cancer with bone and liver mets admitted on 02/19/2021 with abd pain, nausea, vomiting, severe jaundice.      I met again today with Melissa [name]'s family. [place] continues to decline significantly. I spoke with family about her significant decline and concern for bloody vomitus. I explained that we cannot ensure that this may not worsen if she were to go home. I expressed concern for rapidly changing status and ability to keep up with symptoms at home. At this time family would like to pursue hospice facility placement. They would like her in [place] where family can get to her more easily.   I worked with [facility] to coordinate transfer to hospice as soon as possible. I worry if we wait any longer that she will become too unstable to move. They would like for her daughters to visit her at hospice in a more peaceful setting closer to their home so they can bring them in more easily as well.   All questions/concerns addressed. Emotional support provided.   Exam: Declining mentation. Reported episodes of bloody vomitus. Breathing regular but slowing. Extremities cool to touch.   Plan: - DNR - [facility] facility placement  25 min   Vinie Sill, NP Palliative Medicine Team Pager [phone] (Please see [email or web address] for schedule) Team Phone [phone]    Greater than 50%  of this time was spent counseling and coordinating care related to the above assessment and plan

## 2021-03-12 NOTE — Progress Notes (Signed)
1749 provided [place] at 2L for comfort, patient refusing at this time. Will continue to monitor respiratory efforts

## 2021-03-12 NOTE — Progress Notes (Signed)
Scheduled dose of dilaudid given to patient for comfort for during transport and per patient request.

## 2021-03-12 NOTE — Progress Notes (Signed)
Report called into [facility]. Report given to [facility], All questions answered. Pt is to go to room 105. Brother AJ made aware. Awaiting [facility] for transport.

## 2021-03-12 NOTE — Progress Notes (Signed)
2100- patient escorted to bathroom and spit up a large amount of bright red blood clots. Patient states she is "at [employer]" but can identify her brother by name at this time. Unclear of birth day or situation. Appears anxious, restless.  0000- patient spitting up more bright red blood clots. Patient's cognitive state seems to have declined since beginning of this Rns shift. Patient taking more shallow breaths. Patient leaned into this RN and just rested. When asked if she was hurting, she did not reply. When asked if she was tired she noded her head "yes." Patient appears to have intermittent periods of increased restlessness, but easily calmed by repositioning and rubbing her back.  8022- patient spitting up more bright red blood clots. Patient barely able to open her eyes at this time or keep her head up. Patient with more increased restlessness, agitation, and now grunting as well. Pain medication and Robinul given to help slow secretions. Patient's oxygen is 90% and heart rate is now 140.

## 2021-03-12 NOTE — TOC Transition Note (Addendum)
Transition of Care [facility]) - CM/SW Discharge Note   Patient Details  Name: Melissa [name] MRN: [id number] Date of Birth: 01-Jun-1990  [facility] CM/SW Contact:  Lynnell Catalan, RN Phone Number: 02/12/2021, 11:31 AM   Clinical Narrative:     Pt is now going to [facility] instead of to her home with hospice. [facility] contacted for transport and yellow DNR on the chart. RN to call report.

## 2021-03-12 NOTE — Progress Notes (Signed)
°   02/11/2021 1530  Clinical Encounter Type  Visited With Patient and family together  Visit Type Follow-up  Referral From Chaplain (Chaplain Genesis)  Consult/Referral To Chaplain   Chaplain Jorene Guest visited per the request of Chaplain Genesis. Several family members were at the patient's bedside. Ike Bene actively listened as they engaged in storytelling and offering of prayer. This note was prepared by Jeanine Luz, M.Div..  For questions please contact by phone [phone].

## 2021-03-12 NOTE — Progress Notes (Signed)
Family requested that ativan be given due to patient restlessness. Medicated as ordered. Respirations increased prior to administration. Informed family that breathing status will be monitored and provide more supplemental 02 if ativan does not make patient feel more comfortable. Family verbalized understanding. Will continue to monitor.

## 2021-03-12 NOTE — Progress Notes (Signed)
Belongings found in room, called sister Caryl Pina to inform. Message left. Belongings labled and at nurses station

## 2021-03-12 DEATH — deceased

## 2021-03-13 ENCOUNTER — Other Ambulatory Visit (HOSPITAL_COMMUNITY): Payer: Self-pay

## 2021-03-29 ENCOUNTER — Encounter (HOSPITAL_COMMUNITY): Payer: Self-pay

## 2021-04-21 ENCOUNTER — Other Ambulatory Visit: Payer: Self-pay | Admitting: Hematology and Oncology

## 2021-06-11 ENCOUNTER — Encounter: Payer: Self-pay | Admitting: Hematology and Oncology

## 2023-01-30 NOTE — Telephone Encounter (Signed)
Telephone call
